# Patient Record
Sex: Female | Born: 1935 | Race: Black or African American | Hispanic: No | Smoking: Former smoker
Health system: Southern US, Community
[De-identification: ages and names within clinical notes are randomized; demographics above are authoritative.]

## PROBLEM LIST (undated history)

## (undated) DIAGNOSIS — Z972 Presence of dental prosthetic device (complete) (partial): Secondary | ICD-10-CM

## (undated) DIAGNOSIS — I10 Essential (primary) hypertension: Secondary | ICD-10-CM

## (undated) DIAGNOSIS — F419 Anxiety disorder, unspecified: Secondary | ICD-10-CM

## (undated) DIAGNOSIS — Z973 Presence of spectacles and contact lenses: Secondary | ICD-10-CM

## (undated) DIAGNOSIS — M069 Rheumatoid arthritis, unspecified: Secondary | ICD-10-CM

## (undated) DIAGNOSIS — E789 Disorder of lipoprotein metabolism, unspecified: Secondary | ICD-10-CM

## (undated) DIAGNOSIS — IMO0002 Reserved for concepts with insufficient information to code with codable children: Secondary | ICD-10-CM

## (undated) DIAGNOSIS — K759 Inflammatory liver disease, unspecified: Secondary | ICD-10-CM

## (undated) DIAGNOSIS — N289 Disorder of kidney and ureter, unspecified: Secondary | ICD-10-CM

## (undated) DIAGNOSIS — R109 Unspecified abdominal pain: Secondary | ICD-10-CM

## (undated) DIAGNOSIS — C50919 Malignant neoplasm of unspecified site of unspecified female breast: Principal | ICD-10-CM

## (undated) DIAGNOSIS — IMO0001 Reserved for inherently not codable concepts without codable children: Secondary | ICD-10-CM

## (undated) HISTORY — PX: OTHER SURGICAL HISTORY: SHX169

## (undated) HISTORY — DX: Reserved for concepts with insufficient information to code with codable children: IMO0002

## (undated) HISTORY — DX: Inflammatory liver disease, unspecified: K75.9

## (undated) HISTORY — DX: Disorder of lipoprotein metabolism, unspecified: E78.9

## (undated) HISTORY — PX: COLONOSCOPY: SHX174

## (undated) HISTORY — DX: Anxiety disorder, unspecified: F41.9

## (undated) HISTORY — PX: ABDOMINAL HYSTERECTOMY: SHX81

## (undated) HISTORY — DX: Reserved for inherently not codable concepts without codable children: IMO0001

## (undated) HISTORY — DX: Essential (primary) hypertension: I10

## (undated) HISTORY — PX: APPENDECTOMY: SHX54

## (undated) HISTORY — DX: Malignant neoplasm of unspecified site of unspecified female breast: C50.919

---

## 1990-10-21 HISTORY — PX: FOOT SURGERY: SHX648

## 2000-08-01 ENCOUNTER — Emergency Department (HOSPITAL_COMMUNITY): Admission: EM | Admit: 2000-08-01 | Discharge: 2000-08-01 | Payer: Self-pay | Admitting: Emergency Medicine

## 2000-08-13 ENCOUNTER — Other Ambulatory Visit: Admission: RE | Admit: 2000-08-13 | Discharge: 2000-08-13 | Payer: Self-pay | Admitting: *Deleted

## 2001-06-19 ENCOUNTER — Ambulatory Visit (HOSPITAL_COMMUNITY): Admission: RE | Admit: 2001-06-19 | Discharge: 2001-06-19 | Payer: Self-pay | Admitting: Family Medicine

## 2001-08-12 ENCOUNTER — Other Ambulatory Visit: Admission: RE | Admit: 2001-08-12 | Discharge: 2001-08-12 | Payer: Self-pay | Admitting: Family Medicine

## 2002-01-27 ENCOUNTER — Emergency Department (HOSPITAL_COMMUNITY): Admission: EM | Admit: 2002-01-27 | Discharge: 2002-01-27 | Payer: Self-pay

## 2002-01-27 ENCOUNTER — Encounter: Payer: Self-pay | Admitting: *Deleted

## 2002-02-17 ENCOUNTER — Ambulatory Visit (HOSPITAL_COMMUNITY): Admission: RE | Admit: 2002-02-17 | Discharge: 2002-02-17 | Payer: Self-pay | Admitting: Family Medicine

## 2003-01-11 ENCOUNTER — Encounter: Admission: RE | Admit: 2003-01-11 | Discharge: 2003-01-11 | Payer: Self-pay | Admitting: Family Medicine

## 2003-01-11 ENCOUNTER — Encounter: Payer: Self-pay | Admitting: Family Medicine

## 2003-01-17 ENCOUNTER — Encounter: Admission: RE | Admit: 2003-01-17 | Discharge: 2003-01-17 | Payer: Self-pay | Admitting: Family Medicine

## 2003-01-17 ENCOUNTER — Encounter: Payer: Self-pay | Admitting: Family Medicine

## 2003-09-05 ENCOUNTER — Ambulatory Visit (HOSPITAL_COMMUNITY): Admission: RE | Admit: 2003-09-05 | Discharge: 2003-09-05 | Payer: Self-pay | Admitting: Gastroenterology

## 2003-09-19 ENCOUNTER — Encounter: Admission: RE | Admit: 2003-09-19 | Discharge: 2003-09-19 | Payer: Self-pay | Admitting: Gastroenterology

## 2004-03-23 ENCOUNTER — Encounter (INDEPENDENT_AMBULATORY_CARE_PROVIDER_SITE_OTHER): Payer: Self-pay | Admitting: Specialist

## 2004-03-23 ENCOUNTER — Other Ambulatory Visit: Admission: RE | Admit: 2004-03-23 | Discharge: 2004-03-23 | Payer: Self-pay | Admitting: Oncology

## 2004-06-18 ENCOUNTER — Emergency Department (HOSPITAL_COMMUNITY): Admission: EM | Admit: 2004-06-18 | Discharge: 2004-06-18 | Payer: Self-pay | Admitting: Emergency Medicine

## 2004-07-09 ENCOUNTER — Emergency Department (HOSPITAL_COMMUNITY): Admission: EM | Admit: 2004-07-09 | Discharge: 2004-07-09 | Payer: Self-pay | Admitting: Emergency Medicine

## 2004-11-02 ENCOUNTER — Ambulatory Visit: Payer: Self-pay | Admitting: Oncology

## 2004-12-24 ENCOUNTER — Ambulatory Visit: Payer: Self-pay | Admitting: Oncology

## 2005-02-18 ENCOUNTER — Ambulatory Visit: Payer: Self-pay | Admitting: Oncology

## 2005-04-18 ENCOUNTER — Ambulatory Visit: Payer: Self-pay | Admitting: Oncology

## 2005-06-19 ENCOUNTER — Ambulatory Visit: Payer: Self-pay | Admitting: Oncology

## 2005-08-30 ENCOUNTER — Ambulatory Visit: Payer: Self-pay | Admitting: Oncology

## 2005-10-25 ENCOUNTER — Ambulatory Visit: Payer: Self-pay | Admitting: Oncology

## 2005-11-29 ENCOUNTER — Encounter: Admission: RE | Admit: 2005-11-29 | Discharge: 2005-11-29 | Payer: Self-pay | Admitting: Family Medicine

## 2005-12-20 ENCOUNTER — Ambulatory Visit: Payer: Self-pay | Admitting: Oncology

## 2006-02-14 ENCOUNTER — Ambulatory Visit: Payer: Self-pay | Admitting: Oncology

## 2006-03-19 LAB — CBC WITH DIFFERENTIAL (CANCER CENTER ONLY)
BASO#: 0 10*3/uL (ref 0.0–0.2)
EOS%: 2.3 % (ref 0.0–7.0)
LYMPH#: 1.1 10*3/uL (ref 0.9–3.3)
MCHC: 33.1 g/dL (ref 32.0–36.0)
MONO#: 0.5 10*3/uL (ref 0.1–0.9)
NEUT%: 57.2 % (ref 39.6–80.0)
RBC: 3.79 10*6/uL (ref 3.70–5.32)
WBC: 4 10*3/uL (ref 3.9–10.0)

## 2006-03-21 ENCOUNTER — Ambulatory Visit: Payer: Self-pay | Admitting: Gastroenterology

## 2006-04-15 ENCOUNTER — Ambulatory Visit: Payer: Self-pay | Admitting: Oncology

## 2006-04-16 LAB — CBC WITH DIFFERENTIAL (CANCER CENTER ONLY)
BASO#: 0 10*3/uL (ref 0.0–0.2)
EOS%: 3 % (ref 0.0–7.0)
HCT: 31.8 % — ABNORMAL LOW (ref 34.8–46.6)
HGB: 10.9 g/dL — ABNORMAL LOW (ref 11.6–15.9)
LYMPH%: 33.2 % (ref 14.0–48.0)
MCH: 35.9 pg — ABNORMAL HIGH (ref 26.0–34.0)
MCHC: 34.2 g/dL (ref 32.0–36.0)
MCV: 105 fL — ABNORMAL HIGH (ref 81–101)
MONO%: 5.7 % (ref 0.0–13.0)
NEUT%: 57.3 % (ref 39.6–80.0)

## 2006-05-12 LAB — CBC WITH DIFFERENTIAL (CANCER CENTER ONLY)
BASO#: 0 10*3/uL (ref 0.0–0.2)
EOS%: 2.2 % (ref 0.0–7.0)
HCT: 39.6 % (ref 34.8–46.6)
HGB: 13 g/dL (ref 11.6–15.9)
MCH: 35.7 pg — ABNORMAL HIGH (ref 26.0–34.0)
MCHC: 32.9 g/dL (ref 32.0–36.0)
MONO%: 12.6 % (ref 0.0–13.0)
NEUT%: 51.6 % (ref 39.6–80.0)

## 2006-06-06 ENCOUNTER — Ambulatory Visit: Payer: Self-pay | Admitting: Oncology

## 2006-06-09 LAB — CBC WITH DIFFERENTIAL (CANCER CENTER ONLY)
BASO%: 1.3 % (ref 0.0–2.0)
Eosinophils Absolute: 0.2 10*3/uL (ref 0.0–0.5)
HCT: 31.6 % — ABNORMAL LOW (ref 34.8–46.6)
LYMPH#: 1.4 10*3/uL (ref 0.9–3.3)
MONO#: 0.8 10*3/uL (ref 0.1–0.9)
NEUT%: 44.7 % (ref 39.6–80.0)
RBC: 2.96 10*6/uL — ABNORMAL LOW (ref 3.70–5.32)
RDW: 11.2 % (ref 10.5–14.6)
WBC: 4.4 10*3/uL (ref 3.9–10.0)

## 2006-06-26 ENCOUNTER — Ambulatory Visit: Payer: Self-pay | Admitting: Gastroenterology

## 2006-07-07 LAB — CBC WITH DIFFERENTIAL (CANCER CENTER ONLY)
BASO#: 0 10*3/uL (ref 0.0–0.2)
HCT: 37 % (ref 34.8–46.6)
HGB: 12.1 g/dL (ref 11.6–15.9)
LYMPH#: 1.1 10*3/uL (ref 0.9–3.3)
MCHC: 32.8 g/dL (ref 32.0–36.0)
MONO#: 0.3 10*3/uL (ref 0.1–0.9)
NEUT%: 49.3 % (ref 39.6–80.0)
WBC: 3 10*3/uL — ABNORMAL LOW (ref 3.9–10.0)

## 2006-07-25 ENCOUNTER — Ambulatory Visit: Payer: Self-pay | Admitting: Gastroenterology

## 2006-08-07 ENCOUNTER — Ambulatory Visit: Payer: Self-pay | Admitting: Oncology

## 2006-09-01 LAB — CBC WITH DIFFERENTIAL (CANCER CENTER ONLY)
BASO%: 0.5 % (ref 0.0–2.0)
LYMPH#: 0.9 10*3/uL (ref 0.9–3.3)
LYMPH%: 37.8 % (ref 14.0–48.0)
MONO#: 0.4 10*3/uL (ref 0.1–0.9)
NEUT#: 1.1 10*3/uL — ABNORMAL LOW (ref 1.5–6.5)
Platelets: 191 10*3/uL (ref 145–400)
RBC: 3.89 10*6/uL (ref 3.70–5.32)
RDW: 10.7 % (ref 10.5–14.6)
WBC: 2.5 10*3/uL — ABNORMAL LOW (ref 3.9–10.0)

## 2006-09-05 ENCOUNTER — Ambulatory Visit (HOSPITAL_COMMUNITY): Admission: RE | Admit: 2006-09-05 | Discharge: 2006-09-05 | Payer: Self-pay | Admitting: Gastroenterology

## 2006-09-05 ENCOUNTER — Encounter (INDEPENDENT_AMBULATORY_CARE_PROVIDER_SITE_OTHER): Payer: Self-pay | Admitting: Specialist

## 2006-09-26 ENCOUNTER — Ambulatory Visit: Payer: Self-pay | Admitting: Oncology

## 2006-09-29 LAB — CBC WITH DIFFERENTIAL (CANCER CENTER ONLY)
BASO%: 0.3 % (ref 0.0–2.0)
EOS%: 4.6 % (ref 0.0–7.0)
LYMPH%: 30.4 % (ref 14.0–48.0)
MCH: 34.6 pg — ABNORMAL HIGH (ref 26.0–34.0)
MCV: 103 fL — ABNORMAL HIGH (ref 81–101)
MONO%: 9.7 % (ref 0.0–13.0)
NEUT#: 1.8 10*3/uL (ref 1.5–6.5)
Platelets: 145 10*3/uL (ref 145–400)
RDW: 11.2 % (ref 10.5–14.6)

## 2006-10-27 LAB — CBC WITH DIFFERENTIAL (CANCER CENTER ONLY)
BASO%: 0.4 % (ref 0.0–2.0)
EOS%: 2.9 % (ref 0.0–7.0)
Eosinophils Absolute: 0.1 10*3/uL (ref 0.0–0.5)
MCH: 35.7 pg — ABNORMAL HIGH (ref 26.0–34.0)
MONO%: 13.6 % — ABNORMAL HIGH (ref 0.0–13.0)
NEUT#: 1.4 10*3/uL — ABNORMAL LOW (ref 1.5–6.5)
Platelets: 162 10*3/uL (ref 145–400)
RBC: 3.89 10*6/uL (ref 3.70–5.32)
RDW: 11.6 % (ref 10.5–14.6)
WBC: 3 10*3/uL — ABNORMAL LOW (ref 3.9–10.0)

## 2006-11-18 ENCOUNTER — Ambulatory Visit: Payer: Self-pay | Admitting: Gastroenterology

## 2006-11-21 ENCOUNTER — Ambulatory Visit: Payer: Self-pay | Admitting: Oncology

## 2006-11-24 LAB — CBC WITH DIFFERENTIAL (CANCER CENTER ONLY)
BASO#: 0 10*3/uL (ref 0.0–0.2)
EOS%: 3.5 % (ref 0.0–7.0)
MCH: 35.2 pg — ABNORMAL HIGH (ref 26.0–34.0)
MCHC: 33.8 g/dL (ref 32.0–36.0)
MONO%: 13.8 % — ABNORMAL HIGH (ref 0.0–13.0)
NEUT#: 2.2 10*3/uL (ref 1.5–6.5)
Platelets: 184 10*3/uL (ref 145–400)

## 2006-12-19 ENCOUNTER — Ambulatory Visit: Payer: Self-pay | Admitting: Gastroenterology

## 2006-12-22 LAB — CBC WITH DIFFERENTIAL (CANCER CENTER ONLY)
EOS%: 2.4 % (ref 0.0–7.0)
MCH: 36.1 pg — ABNORMAL HIGH (ref 26.0–34.0)
MCHC: 34.2 g/dL (ref 32.0–36.0)
MONO%: 9.8 % (ref 0.0–13.0)
NEUT#: 1.9 10*3/uL (ref 1.5–6.5)
Platelets: 202 10*3/uL (ref 145–400)

## 2007-01-15 ENCOUNTER — Ambulatory Visit: Payer: Self-pay | Admitting: Oncology

## 2007-01-19 LAB — BASIC METABOLIC PANEL
CO2: 25 mEq/L (ref 19–32)
Calcium: 9.7 mg/dL (ref 8.4–10.5)
Chloride: 104 mEq/L (ref 96–112)
Glucose, Bld: 108 mg/dL — ABNORMAL HIGH (ref 70–99)
Sodium: 136 mEq/L (ref 135–145)

## 2007-01-19 LAB — CBC WITH DIFFERENTIAL (CANCER CENTER ONLY)
BASO%: 0.3 % (ref 0.0–2.0)
Eosinophils Absolute: 0.1 10*3/uL (ref 0.0–0.5)
MCH: 35.4 pg — ABNORMAL HIGH (ref 26.0–34.0)
MONO%: 8.9 % (ref 0.0–13.0)
NEUT#: 2.5 10*3/uL (ref 1.5–6.5)
Platelets: 219 10*3/uL (ref 145–400)
RBC: 3.31 10*6/uL — ABNORMAL LOW (ref 3.70–5.32)
RDW: 11.3 % (ref 10.5–14.6)
WBC: 4.1 10*3/uL (ref 3.9–10.0)

## 2007-01-19 LAB — IRON AND TIBC: %SAT: 33 % (ref 20–55)

## 2007-01-19 LAB — FERRITIN: Ferritin: 502 ng/mL — ABNORMAL HIGH (ref 10–291)

## 2007-02-17 LAB — CBC WITH DIFFERENTIAL (CANCER CENTER ONLY)
BASO%: 0.5 % (ref 0.0–2.0)
Eosinophils Absolute: 0.1 10*3/uL (ref 0.0–0.5)
LYMPH#: 1.6 10*3/uL (ref 0.9–3.3)
LYMPH%: 39.5 % (ref 14.0–48.0)
MCV: 104 fL — ABNORMAL HIGH (ref 81–101)
MONO#: 0.3 10*3/uL (ref 0.1–0.9)
NEUT#: 1.9 10*3/uL (ref 1.5–6.5)
Platelets: 234 10*3/uL (ref 145–400)
RBC: 3.34 10*6/uL — ABNORMAL LOW (ref 3.70–5.32)
WBC: 3.9 10*3/uL (ref 3.9–10.0)

## 2007-03-13 ENCOUNTER — Ambulatory Visit: Payer: Self-pay | Admitting: Oncology

## 2007-03-17 LAB — CBC WITH DIFFERENTIAL (CANCER CENTER ONLY)
BASO#: 0 10*3/uL (ref 0.0–0.2)
BASO%: 0.5 % (ref 0.0–2.0)
EOS%: 2 % (ref 0.0–7.0)
HCT: 33.9 % — ABNORMAL LOW (ref 34.8–46.6)
HGB: 11.4 g/dL — ABNORMAL LOW (ref 11.6–15.9)
LYMPH#: 1.2 10*3/uL (ref 0.9–3.3)
MCH: 33.1 pg (ref 26.0–34.0)
MCHC: 33.6 g/dL (ref 32.0–36.0)
MONO%: 10.1 % (ref 0.0–13.0)
NEUT#: 2.5 10*3/uL (ref 1.5–6.5)
NEUT%: 58.7 % (ref 39.6–80.0)
RDW: 11.5 % (ref 10.5–14.6)

## 2007-04-06 ENCOUNTER — Encounter: Admission: RE | Admit: 2007-04-06 | Discharge: 2007-04-06 | Payer: Self-pay | Admitting: Family Medicine

## 2007-04-13 ENCOUNTER — Ambulatory Visit: Payer: Self-pay | Admitting: Oncology

## 2007-04-14 LAB — CBC WITH DIFFERENTIAL (CANCER CENTER ONLY)
BASO%: 0.3 % (ref 0.0–2.0)
EOS%: 3.1 % (ref 0.0–7.0)
LYMPH#: 1.3 10*3/uL (ref 0.9–3.3)
MCH: 31.5 pg (ref 26.0–34.0)
MCHC: 33 g/dL (ref 32.0–36.0)
MONO%: 11 % (ref 0.0–13.0)
NEUT#: 2.6 10*3/uL (ref 1.5–6.5)
Platelets: 284 10*3/uL (ref 145–400)
RDW: 12.5 % (ref 10.5–14.6)

## 2007-04-15 LAB — COMPREHENSIVE METABOLIC PANEL
ALT: 41 U/L — ABNORMAL HIGH (ref 0–35)
AST: 60 U/L — ABNORMAL HIGH (ref 0–37)
Albumin: 3.6 g/dL (ref 3.5–5.2)
Alkaline Phosphatase: 191 U/L — ABNORMAL HIGH (ref 39–117)
BUN: 18 mg/dL (ref 6–23)
Calcium: 9.6 mg/dL (ref 8.4–10.5)
Chloride: 101 mEq/L (ref 96–112)
Potassium: 4 mEq/L (ref 3.5–5.3)
Sodium: 133 mEq/L — ABNORMAL LOW (ref 135–145)
Total Protein: 7.9 g/dL (ref 6.0–8.3)

## 2007-05-12 LAB — CBC WITH DIFFERENTIAL (CANCER CENTER ONLY)
Eosinophils Absolute: 0.2 10*3/uL (ref 0.0–0.5)
HGB: 11.5 g/dL — ABNORMAL LOW (ref 11.6–15.9)
LYMPH#: 0.6 10*3/uL — ABNORMAL LOW (ref 0.9–3.3)
MCH: 33.3 pg (ref 26.0–34.0)
MONO#: 0.4 10*3/uL (ref 0.1–0.9)
MONO%: 2.9 % (ref 0.0–13.0)
NEUT#: 12.5 10*3/uL — ABNORMAL HIGH (ref 1.5–6.5)
Platelets: 222 10*3/uL (ref 145–400)
RBC: 3.45 10*6/uL — ABNORMAL LOW (ref 3.70–5.32)
WBC: 13.7 10*3/uL — ABNORMAL HIGH (ref 3.9–10.0)

## 2007-05-13 ENCOUNTER — Encounter: Admission: RE | Admit: 2007-05-13 | Discharge: 2007-05-13 | Payer: Self-pay | Admitting: Orthopedic Surgery

## 2007-06-08 ENCOUNTER — Ambulatory Visit: Payer: Self-pay | Admitting: Oncology

## 2007-06-10 LAB — CBC WITH DIFFERENTIAL (CANCER CENTER ONLY)
BASO#: 0 10*3/uL (ref 0.0–0.2)
Eosinophils Absolute: 0.1 10*3/uL (ref 0.0–0.5)
HCT: 36.5 % (ref 34.8–46.6)
HGB: 12.4 g/dL (ref 11.6–15.9)
LYMPH#: 1 10*3/uL (ref 0.9–3.3)
LYMPH%: 26.6 % (ref 14.0–48.0)
MCV: 100 fL (ref 81–101)
MONO#: 0.6 10*3/uL (ref 0.1–0.9)
NEUT%: 56.7 % (ref 39.6–80.0)
RBC: 3.64 10*6/uL — ABNORMAL LOW (ref 3.70–5.32)
WBC: 3.8 10*3/uL — ABNORMAL LOW (ref 3.9–10.0)

## 2007-07-07 LAB — CBC WITH DIFFERENTIAL (CANCER CENTER ONLY)
BASO#: 0 10*3/uL (ref 0.0–0.2)
BASO%: 0.3 % (ref 0.0–2.0)
EOS%: 4.3 % (ref 0.0–7.0)
HCT: 31.5 % — ABNORMAL LOW (ref 34.8–46.6)
HGB: 10.6 g/dL — ABNORMAL LOW (ref 11.6–15.9)
LYMPH#: 1 10*3/uL (ref 0.9–3.3)
LYMPH%: 28.5 % (ref 14.0–48.0)
MCHC: 33.7 g/dL (ref 32.0–36.0)
MCV: 102 fL — ABNORMAL HIGH (ref 81–101)
NEUT%: 54.4 % (ref 39.6–80.0)
RDW: 12.6 % (ref 10.5–14.6)

## 2007-07-31 ENCOUNTER — Ambulatory Visit: Payer: Self-pay | Admitting: Oncology

## 2007-08-20 LAB — CBC WITH DIFFERENTIAL (CANCER CENTER ONLY)
BASO#: 0 10*3/uL (ref 0.0–0.2)
Eosinophils Absolute: 0.2 10*3/uL (ref 0.0–0.5)
HCT: 33.4 % — ABNORMAL LOW (ref 34.8–46.6)
HGB: 11.3 g/dL — ABNORMAL LOW (ref 11.6–15.9)
LYMPH%: 39.1 % (ref 14.0–48.0)
MCH: 35.1 pg — ABNORMAL HIGH (ref 26.0–34.0)
MCV: 103 fL — ABNORMAL HIGH (ref 81–101)
MONO#: 0.2 10*3/uL (ref 0.1–0.9)
MONO%: 5.4 % (ref 0.0–13.0)
RBC: 3.23 10*6/uL — ABNORMAL LOW (ref 3.70–5.32)
WBC: 4.2 10*3/uL (ref 3.9–10.0)

## 2007-08-20 LAB — IRON AND TIBC
%SAT: 45 % (ref 20–55)
Iron: 187 ug/dL — ABNORMAL HIGH (ref 42–145)
TIBC: 411 ug/dL (ref 250–470)
UIBC: 224 ug/dL

## 2007-08-20 LAB — FERRITIN: Ferritin: 300 ng/mL — ABNORMAL HIGH (ref 10–291)

## 2007-09-14 ENCOUNTER — Ambulatory Visit: Payer: Self-pay | Admitting: Oncology

## 2007-09-15 LAB — CBC WITH DIFFERENTIAL (CANCER CENTER ONLY)
BASO%: 0.5 % (ref 0.0–2.0)
Eosinophils Absolute: 0.1 10*3/uL (ref 0.0–0.5)
HCT: 36.7 % (ref 34.8–46.6)
LYMPH%: 41.5 % (ref 14.0–48.0)
MCH: 34.6 pg — ABNORMAL HIGH (ref 26.0–34.0)
MCV: 103 fL — ABNORMAL HIGH (ref 81–101)
MONO#: 0.5 10*3/uL (ref 0.1–0.9)
MONO%: 12.3 % (ref 0.0–13.0)
NEUT%: 42.4 % (ref 39.6–80.0)
Platelets: 183 10*3/uL (ref 145–400)
RDW: 10.8 % (ref 10.5–14.6)
WBC: 4 10*3/uL (ref 3.9–10.0)

## 2007-10-13 LAB — CBC WITH DIFFERENTIAL (CANCER CENTER ONLY)
BASO%: 0.4 % (ref 0.0–2.0)
EOS%: 3.2 % (ref 0.0–7.0)
HCT: 35.9 % (ref 34.8–46.6)
LYMPH%: 36.5 % (ref 14.0–48.0)
MCHC: 33.8 g/dL (ref 32.0–36.0)
MCV: 100 fL (ref 81–101)
NEUT%: 50.4 % (ref 39.6–80.0)
Platelets: 193 10*3/uL (ref 145–400)
RDW: 11.5 % (ref 10.5–14.6)
WBC: 5.2 10*3/uL (ref 3.9–10.0)

## 2007-11-09 ENCOUNTER — Ambulatory Visit: Payer: Self-pay | Admitting: Oncology

## 2007-11-10 LAB — CBC WITH DIFFERENTIAL (CANCER CENTER ONLY)
BASO#: 0 10*3/uL (ref 0.0–0.2)
Eosinophils Absolute: 0.2 10*3/uL (ref 0.0–0.5)
HGB: 11.6 g/dL (ref 11.6–15.9)
LYMPH%: 41.6 % (ref 14.0–48.0)
MCV: 101 fL (ref 81–101)
MONO#: 0.6 10*3/uL (ref 0.1–0.9)
Platelets: 211 10*3/uL (ref 145–400)
RBC: 3.33 10*6/uL — ABNORMAL LOW (ref 3.70–5.32)
WBC: 5.1 10*3/uL (ref 3.9–10.0)

## 2007-12-09 LAB — CBC WITH DIFFERENTIAL (CANCER CENTER ONLY)
BASO#: 0 10*3/uL (ref 0.0–0.2)
EOS%: 4.9 % (ref 0.0–7.0)
Eosinophils Absolute: 0.2 10*3/uL (ref 0.0–0.5)
HGB: 12.3 g/dL (ref 11.6–15.9)
LYMPH%: 38.9 % (ref 14.0–48.0)
MCH: 34.7 pg — ABNORMAL HIGH (ref 26.0–34.0)
MCHC: 34.1 g/dL (ref 32.0–36.0)
MCV: 102 fL — ABNORMAL HIGH (ref 81–101)
MONO%: 8.4 % (ref 0.0–13.0)
Platelets: 167 10*3/uL (ref 145–400)
RBC: 3.54 10*6/uL — ABNORMAL LOW (ref 3.70–5.32)

## 2007-12-28 ENCOUNTER — Ambulatory Visit: Payer: Self-pay | Admitting: Oncology

## 2007-12-28 LAB — CBC WITH DIFFERENTIAL (CANCER CENTER ONLY)
BASO#: 0 10*3/uL (ref 0.0–0.2)
EOS%: 3.5 % (ref 0.0–7.0)
Eosinophils Absolute: 0.2 10*3/uL (ref 0.0–0.5)
HCT: 34.4 % — ABNORMAL LOW (ref 34.8–46.6)
HGB: 11.4 g/dL — ABNORMAL LOW (ref 11.6–15.9)
LYMPH#: 2.1 10*3/uL (ref 0.9–3.3)
MCHC: 33.3 g/dL (ref 32.0–36.0)
MONO#: 0.4 10*3/uL (ref 0.1–0.9)
NEUT#: 2.4 10*3/uL (ref 1.5–6.5)
NEUT%: 46.8 % (ref 39.6–80.0)
RBC: 3.38 10*6/uL — ABNORMAL LOW (ref 3.70–5.32)
WBC: 5.1 10*3/uL (ref 3.9–10.0)

## 2008-02-09 LAB — CBC WITH DIFFERENTIAL (CANCER CENTER ONLY)
BASO#: 0 10*3/uL (ref 0.0–0.2)
EOS%: 3 % (ref 0.0–7.0)
Eosinophils Absolute: 0.1 10*3/uL (ref 0.0–0.5)
HCT: 39.4 % (ref 34.8–46.6)
HGB: 13.6 g/dL (ref 11.6–15.9)
MCH: 34.2 pg — ABNORMAL HIGH (ref 26.0–34.0)
MCHC: 34.5 g/dL (ref 32.0–36.0)
MONO%: 8.6 % (ref 0.0–13.0)
NEUT#: 2.1 10*3/uL (ref 1.5–6.5)
NEUT%: 56.2 % (ref 39.6–80.0)
RBC: 3.97 10*6/uL (ref 3.70–5.32)

## 2008-02-09 LAB — IRON AND TIBC: UIBC: 219 ug/dL

## 2008-02-29 ENCOUNTER — Ambulatory Visit: Payer: Self-pay | Admitting: Oncology

## 2008-03-01 LAB — CBC WITH DIFFERENTIAL (CANCER CENTER ONLY)
BASO%: 0.3 % (ref 0.0–2.0)
LYMPH%: 38.9 % (ref 14.0–48.0)
MCV: 97 fL (ref 81–101)
MONO#: 0.4 10*3/uL (ref 0.1–0.9)
MONO%: 9.8 % (ref 0.0–13.0)
Platelets: 193 10*3/uL (ref 145–400)
RDW: 10.6 % (ref 10.5–14.6)
WBC: 3.8 10*3/uL — ABNORMAL LOW (ref 3.9–10.0)

## 2008-03-31 LAB — CBC WITH DIFFERENTIAL (CANCER CENTER ONLY)
BASO%: 0.4 % (ref 0.0–2.0)
HCT: 33.9 % — ABNORMAL LOW (ref 34.8–46.6)
LYMPH#: 1.5 10*3/uL (ref 0.9–3.3)
MONO#: 0.4 10*3/uL (ref 0.1–0.9)
Platelets: 182 10*3/uL (ref 145–400)
RDW: 11.7 % (ref 10.5–14.6)
WBC: 4.5 10*3/uL (ref 3.9–10.0)

## 2008-04-25 ENCOUNTER — Ambulatory Visit: Payer: Self-pay | Admitting: Oncology

## 2008-04-28 LAB — CBC WITH DIFFERENTIAL (CANCER CENTER ONLY)
BASO%: 0.5 % (ref 0.0–2.0)
EOS%: 3.3 % (ref 0.0–7.0)
HCT: 39.9 % (ref 34.8–46.6)
LYMPH#: 2.2 10*3/uL (ref 0.9–3.3)
MCHC: 33.7 g/dL (ref 32.0–36.0)
MONO#: 0.5 10*3/uL (ref 0.1–0.9)
NEUT#: 2.2 10*3/uL (ref 1.5–6.5)
NEUT%: 43.4 % (ref 39.6–80.0)
RDW: 11.3 % (ref 10.5–14.6)
WBC: 4.9 10*3/uL (ref 3.9–10.0)

## 2008-05-05 ENCOUNTER — Encounter: Admission: RE | Admit: 2008-05-05 | Discharge: 2008-05-05 | Payer: Self-pay | Admitting: Family Medicine

## 2008-05-16 ENCOUNTER — Inpatient Hospital Stay (HOSPITAL_COMMUNITY): Admission: EM | Admit: 2008-05-16 | Discharge: 2008-05-18 | Payer: Self-pay | Admitting: Emergency Medicine

## 2008-05-17 ENCOUNTER — Encounter (INDEPENDENT_AMBULATORY_CARE_PROVIDER_SITE_OTHER): Payer: Self-pay | Admitting: Gastroenterology

## 2008-05-24 LAB — CBC WITH DIFFERENTIAL (CANCER CENTER ONLY)
BASO#: 0 10*3/uL (ref 0.0–0.2)
Eosinophils Absolute: 0.2 10*3/uL (ref 0.0–0.5)
HCT: 29.2 % — ABNORMAL LOW (ref 34.8–46.6)
HGB: 10.2 g/dL — ABNORMAL LOW (ref 11.6–15.9)
LYMPH#: 1.8 10*3/uL (ref 0.9–3.3)
MCH: 33.1 pg (ref 26.0–34.0)
NEUT#: 3.7 10*3/uL (ref 1.5–6.5)
NEUT%: 58.9 % (ref 39.6–80.0)
RBC: 3.07 10*6/uL — ABNORMAL LOW (ref 3.70–5.32)

## 2008-06-17 ENCOUNTER — Ambulatory Visit: Payer: Self-pay | Admitting: Oncology

## 2008-06-21 LAB — CBC WITH DIFFERENTIAL (CANCER CENTER ONLY)
EOS%: 2.4 % (ref 0.0–7.0)
Eosinophils Absolute: 0.2 10*3/uL (ref 0.0–0.5)
LYMPH%: 31.1 % (ref 14.0–48.0)
MCH: 32.7 pg (ref 26.0–34.0)
MCHC: 34.1 g/dL (ref 32.0–36.0)
MCV: 96 fL (ref 81–101)
MONO%: 9.9 % (ref 0.0–13.0)
Platelets: 240 10*3/uL (ref 145–400)
RBC: 3.42 10*6/uL — ABNORMAL LOW (ref 3.70–5.32)
RDW: 11.1 % (ref 10.5–14.6)

## 2008-06-25 ENCOUNTER — Ambulatory Visit: Payer: Self-pay | Admitting: Cardiology

## 2008-06-25 ENCOUNTER — Observation Stay (HOSPITAL_COMMUNITY): Admission: EM | Admit: 2008-06-25 | Discharge: 2008-06-28 | Payer: Self-pay | Admitting: Emergency Medicine

## 2008-06-28 ENCOUNTER — Encounter (INDEPENDENT_AMBULATORY_CARE_PROVIDER_SITE_OTHER): Payer: Self-pay | Admitting: Internal Medicine

## 2008-07-18 LAB — CBC WITH DIFFERENTIAL (CANCER CENTER ONLY)
BASO#: 0 10*3/uL (ref 0.0–0.2)
Eosinophils Absolute: 0.2 10*3/uL (ref 0.0–0.5)
HCT: 31.6 % — ABNORMAL LOW (ref 34.8–46.6)
HGB: 10.8 g/dL — ABNORMAL LOW (ref 11.6–15.9)
LYMPH%: 36 % (ref 14.0–48.0)
MCH: 32.4 pg (ref 26.0–34.0)
MCHC: 34.2 g/dL (ref 32.0–36.0)
MCV: 95 fL (ref 81–101)
MONO%: 10.8 % (ref 0.0–13.0)
NEUT%: 49.7 % (ref 39.6–80.0)
RBC: 3.33 10*6/uL — ABNORMAL LOW (ref 3.70–5.32)

## 2008-08-05 ENCOUNTER — Ambulatory Visit: Payer: Self-pay | Admitting: Oncology

## 2008-08-10 LAB — CBC WITH DIFFERENTIAL (CANCER CENTER ONLY)
BASO#: 0 10*3/uL (ref 0.0–0.2)
BASO%: 0.8 % (ref 0.0–2.0)
EOS%: 3.2 % (ref 0.0–7.0)
HCT: 36.6 % (ref 34.8–46.6)
HGB: 12.4 g/dL (ref 11.6–15.9)
LYMPH#: 2.1 10*3/uL (ref 0.9–3.3)
MCHC: 33.8 g/dL (ref 32.0–36.0)
MONO#: 0.7 10*3/uL (ref 0.1–0.9)
NEUT#: 2.6 10*3/uL (ref 1.5–6.5)
NEUT%: 47.1 % (ref 39.6–80.0)
WBC: 5.6 10*3/uL (ref 3.9–10.0)

## 2008-08-10 LAB — IRON AND TIBC
%SAT: 42 % (ref 20–55)
TIBC: 435 ug/dL (ref 250–470)
UIBC: 251 ug/dL

## 2008-09-07 LAB — CBC WITH DIFFERENTIAL (CANCER CENTER ONLY)
BASO%: 0.5 % (ref 0.0–2.0)
EOS%: 2.6 % (ref 0.0–7.0)
LYMPH#: 2.2 10*3/uL (ref 0.9–3.3)
MCHC: 34.7 g/dL (ref 32.0–36.0)
MONO#: 0.6 10*3/uL (ref 0.1–0.9)
NEUT#: 3 10*3/uL (ref 1.5–6.5)
NEUT%: 49.8 % (ref 39.6–80.0)
Platelets: 216 10*3/uL (ref 145–400)
RDW: 13 % (ref 10.5–14.6)
WBC: 6 10*3/uL (ref 3.9–10.0)

## 2008-10-03 ENCOUNTER — Ambulatory Visit: Payer: Self-pay | Admitting: Oncology

## 2008-10-05 LAB — CBC WITH DIFFERENTIAL (CANCER CENTER ONLY)
BASO%: 0.7 % (ref 0.0–2.0)
EOS%: 2.5 % (ref 0.0–7.0)
HCT: 35.6 % (ref 34.8–46.6)
LYMPH%: 29.8 % (ref 14.0–48.0)
MCH: 31.6 pg (ref 26.0–34.0)
MCHC: 33 g/dL (ref 32.0–36.0)
MCV: 96 fL (ref 81–101)
MONO#: 0.5 10*3/uL (ref 0.1–0.9)
MONO%: 7.4 % (ref 0.0–13.0)
NEUT%: 59.6 % (ref 39.6–80.0)
Platelets: 229 10*3/uL (ref 145–400)
RDW: 11.9 % (ref 10.5–14.6)
WBC: 7 10*3/uL (ref 3.9–10.0)

## 2008-11-02 LAB — CBC WITH DIFFERENTIAL (CANCER CENTER ONLY)
Eosinophils Absolute: 0.2 10*3/uL (ref 0.0–0.5)
HCT: 37.8 % (ref 34.8–46.6)
LYMPH%: 29.5 % (ref 14.0–48.0)
MCH: 31.1 pg (ref 26.0–34.0)
MCV: 95 fL (ref 81–101)
MONO%: 6.2 % (ref 0.0–13.0)
NEUT%: 61.1 % (ref 39.6–80.0)
Platelets: 232 10*3/uL (ref 145–400)
RBC: 3.99 10*6/uL (ref 3.70–5.32)
RDW: 11.8 % (ref 10.5–14.6)

## 2008-11-29 ENCOUNTER — Ambulatory Visit: Payer: Self-pay | Admitting: Oncology

## 2008-12-28 LAB — CBC WITH DIFFERENTIAL (CANCER CENTER ONLY)
BASO#: 0 10*3/uL (ref 0.0–0.2)
Eosinophils Absolute: 0.1 10*3/uL (ref 0.0–0.5)
HGB: 11.2 g/dL — ABNORMAL LOW (ref 11.6–15.9)
LYMPH%: 28.5 % (ref 14.0–48.0)
MCH: 30.5 pg (ref 26.0–34.0)
MCV: 93 fL (ref 81–101)
MONO#: 0.5 10*3/uL (ref 0.1–0.9)
MONO%: 9.2 % (ref 0.0–13.0)
RBC: 3.66 10*6/uL — ABNORMAL LOW (ref 3.70–5.32)

## 2009-01-23 ENCOUNTER — Ambulatory Visit: Payer: Self-pay | Admitting: Oncology

## 2009-01-25 LAB — CBC WITH DIFFERENTIAL (CANCER CENTER ONLY)
BASO#: 0 10*3/uL (ref 0.0–0.2)
Eosinophils Absolute: 0.1 10*3/uL (ref 0.0–0.5)
HGB: 11.8 g/dL (ref 11.6–15.9)
LYMPH#: 1.5 10*3/uL (ref 0.9–3.3)
MONO#: 0.5 10*3/uL (ref 0.1–0.9)
NEUT#: 3.3 10*3/uL (ref 1.5–6.5)
RBC: 3.75 10*6/uL (ref 3.70–5.32)
WBC: 5.4 10*3/uL (ref 3.9–10.0)

## 2009-01-25 LAB — FERRITIN: Ferritin: 64 ng/mL (ref 10–291)

## 2009-01-25 LAB — BASIC METABOLIC PANEL - CANCER CENTER ONLY
BUN, Bld: 19 mg/dL (ref 7–22)
Calcium: 9.4 mg/dL (ref 8.0–10.3)
Glucose, Bld: 106 mg/dL (ref 73–118)
Potassium: 4.2 mEq/L (ref 3.3–4.7)

## 2009-01-25 LAB — IRON AND TIBC: Iron: 147 ug/dL — ABNORMAL HIGH (ref 42–145)

## 2009-02-22 LAB — CBC WITH DIFFERENTIAL (CANCER CENTER ONLY)
BASO#: 0 10*3/uL (ref 0.0–0.2)
BASO%: 0.9 % (ref 0.0–2.0)
HCT: 38.6 % (ref 34.8–46.6)
HGB: 13.2 g/dL (ref 11.6–15.9)
LYMPH#: 1.5 10*3/uL (ref 0.9–3.3)
MONO#: 0.5 10*3/uL (ref 0.1–0.9)
NEUT%: 56.3 % (ref 39.6–80.0)
RBC: 4.07 10*6/uL (ref 3.70–5.32)
RDW: 12.7 % (ref 10.5–14.6)
WBC: 5 10*3/uL (ref 3.9–10.0)

## 2009-03-17 ENCOUNTER — Ambulatory Visit: Payer: Self-pay | Admitting: Oncology

## 2009-03-22 LAB — CBC WITH DIFFERENTIAL (CANCER CENTER ONLY)
BASO%: 0.5 % (ref 0.0–2.0)
LYMPH#: 1.6 10*3/uL (ref 0.9–3.3)
MONO#: 0.6 10*3/uL (ref 0.1–0.9)
NEUT#: 2.7 10*3/uL (ref 1.5–6.5)
Platelets: 231 10*3/uL (ref 145–400)
RDW: 13.7 % (ref 10.5–14.6)
WBC: 5 10*3/uL (ref 3.9–10.0)

## 2009-04-17 ENCOUNTER — Ambulatory Visit: Payer: Self-pay | Admitting: Oncology

## 2009-04-19 LAB — CBC WITH DIFFERENTIAL (CANCER CENTER ONLY)
BASO#: 0 10*3/uL (ref 0.0–0.2)
EOS%: 3.1 % (ref 0.0–7.0)
HCT: 37.3 % (ref 34.8–46.6)
HGB: 12.9 g/dL (ref 11.6–15.9)
LYMPH#: 1.5 10*3/uL (ref 0.9–3.3)
MCHC: 34.5 g/dL (ref 32.0–36.0)
NEUT%: 42.6 % (ref 39.6–80.0)

## 2009-05-15 ENCOUNTER — Ambulatory Visit: Payer: Self-pay | Admitting: Oncology

## 2009-05-17 LAB — CBC WITH DIFFERENTIAL (CANCER CENTER ONLY)
BASO%: 0.5 % (ref 0.0–2.0)
LYMPH%: 27.3 % (ref 14.0–48.0)
MCH: 33 pg (ref 26.0–34.0)
MCV: 94 fL (ref 81–101)
MONO%: 9.6 % (ref 0.0–13.0)
NEUT#: 3.7 10*3/uL (ref 1.5–6.5)
Platelets: 233 10*3/uL (ref 145–400)
RDW: 13.4 % (ref 10.5–14.6)
WBC: 6.3 10*3/uL (ref 3.9–10.0)

## 2009-06-09 ENCOUNTER — Ambulatory Visit: Payer: Self-pay | Admitting: Oncology

## 2009-06-14 LAB — CBC WITH DIFFERENTIAL (CANCER CENTER ONLY)
EOS%: 3.3 % (ref 0.0–7.0)
Eosinophils Absolute: 0.1 10*3/uL (ref 0.0–0.5)
LYMPH%: 31.8 % (ref 14.0–48.0)
MCH: 33.8 pg (ref 26.0–34.0)
MCHC: 34.7 g/dL (ref 32.0–36.0)
MCV: 97 fL (ref 81–101)
MONO%: 9.2 % (ref 0.0–13.0)
Platelets: 251 10*3/uL (ref 145–400)
RBC: 3.71 10*6/uL (ref 3.70–5.32)

## 2009-07-05 ENCOUNTER — Ambulatory Visit: Payer: Self-pay | Admitting: Oncology

## 2009-07-12 LAB — CBC WITH DIFFERENTIAL (CANCER CENTER ONLY)
BASO#: 0 10*3/uL (ref 0.0–0.2)
Eosinophils Absolute: 0.2 10*3/uL (ref 0.0–0.5)
HCT: 34.6 % — ABNORMAL LOW (ref 34.8–46.6)
HGB: 11.7 g/dL (ref 11.6–15.9)
LYMPH%: 30.9 % (ref 14.0–48.0)
MCH: 33.7 pg (ref 26.0–34.0)
MCHC: 33.9 g/dL (ref 32.0–36.0)
MCV: 99 fL (ref 81–101)
MONO%: 8.6 % (ref 0.0–13.0)
NEUT#: 2.8 10*3/uL (ref 1.5–6.5)
NEUT%: 56.4 % (ref 39.6–80.0)
RBC: 3.48 10*6/uL — ABNORMAL LOW (ref 3.70–5.32)

## 2009-08-08 ENCOUNTER — Ambulatory Visit: Payer: Self-pay | Admitting: Oncology

## 2009-08-09 LAB — CBC WITH DIFFERENTIAL (CANCER CENTER ONLY)
BASO#: 0 10*3/uL (ref 0.0–0.2)
Eosinophils Absolute: 0.1 10*3/uL (ref 0.0–0.5)
HCT: 37.3 % (ref 34.8–46.6)
HGB: 12.5 g/dL (ref 11.6–15.9)
LYMPH#: 1.5 10*3/uL (ref 0.9–3.3)
MONO#: 0.5 10*3/uL (ref 0.1–0.9)
NEUT#: 2.7 10*3/uL (ref 1.5–6.5)
NEUT%: 55.9 % (ref 39.6–80.0)
RBC: 3.69 10*6/uL — ABNORMAL LOW (ref 3.70–5.32)

## 2009-08-09 LAB — BASIC METABOLIC PANEL - CANCER CENTER ONLY
Calcium: 9.8 mg/dL (ref 8.0–10.3)
Creat: 1.1 mg/dl (ref 0.6–1.2)
Sodium: 139 mEq/L (ref 128–145)

## 2009-08-09 LAB — IRON AND TIBC
TIBC: 400 ug/dL (ref 250–470)
UIBC: 268 ug/dL

## 2009-09-05 ENCOUNTER — Ambulatory Visit: Payer: Self-pay | Admitting: Oncology

## 2009-09-06 LAB — CBC WITH DIFFERENTIAL (CANCER CENTER ONLY)
BASO%: 0.4 % (ref 0.0–2.0)
EOS%: 2.9 % (ref 0.0–7.0)
HCT: 35.3 % (ref 34.8–46.6)
HGB: 11.8 g/dL (ref 11.6–15.9)
LYMPH#: 1.5 10*3/uL (ref 0.9–3.3)
MCHC: 33.5 g/dL (ref 32.0–36.0)
MONO#: 0.4 10*3/uL (ref 0.1–0.9)
NEUT#: 3.7 10*3/uL (ref 1.5–6.5)
NEUT%: 63.4 % (ref 39.6–80.0)
RDW: 11.7 % (ref 10.5–14.6)
WBC: 5.8 10*3/uL (ref 3.9–10.0)

## 2009-09-29 ENCOUNTER — Ambulatory Visit: Payer: Self-pay | Admitting: Oncology

## 2009-10-04 LAB — CBC WITH DIFFERENTIAL (CANCER CENTER ONLY)
BASO%: 0.6 % (ref 0.0–2.0)
EOS%: 3.3 % (ref 0.0–7.0)
LYMPH#: 1.7 10*3/uL (ref 0.9–3.3)
LYMPH%: 38.9 % (ref 14.0–48.0)
MCHC: 33.1 g/dL (ref 32.0–36.0)
MONO#: 0.4 10*3/uL (ref 0.1–0.9)
NEUT%: 48.1 % (ref 39.6–80.0)
Platelets: 244 10*3/uL (ref 145–400)
RDW: 11.9 % (ref 10.5–14.6)
WBC: 4.5 10*3/uL (ref 3.9–10.0)

## 2009-10-30 ENCOUNTER — Ambulatory Visit: Payer: Self-pay | Admitting: Oncology

## 2009-11-28 ENCOUNTER — Ambulatory Visit: Payer: Self-pay | Admitting: Oncology

## 2009-11-29 LAB — CBC WITH DIFFERENTIAL (CANCER CENTER ONLY)
Eosinophils Absolute: 0.2 10*3/uL (ref 0.0–0.5)
LYMPH#: 1.5 10*3/uL (ref 0.9–3.3)
LYMPH%: 26.4 % (ref 14.0–48.0)
MONO#: 0.5 10*3/uL (ref 0.1–0.9)
MONO%: 8.5 % (ref 0.0–13.0)
NEUT%: 60.9 % (ref 39.6–80.0)
WBC: 5.6 10*3/uL (ref 3.9–10.0)

## 2009-12-22 LAB — CBC WITH DIFFERENTIAL (CANCER CENTER ONLY)
BASO%: 0.4 % (ref 0.0–2.0)
Eosinophils Absolute: 0.1 10*3/uL (ref 0.0–0.5)
HGB: 10.5 g/dL — ABNORMAL LOW (ref 11.6–15.9)
LYMPH%: 31.2 % (ref 14.0–48.0)
MCH: 33.2 pg (ref 26.0–34.0)
MCHC: 34 g/dL (ref 32.0–36.0)
MONO#: 0.5 10*3/uL (ref 0.1–0.9)
NEUT%: 57.5 % (ref 39.6–80.0)
RBC: 3.17 10*6/uL — ABNORMAL LOW (ref 3.70–5.32)
WBC: 5.7 10*3/uL (ref 3.9–10.0)

## 2009-12-22 LAB — IRON AND TIBC
%SAT: 16 % — ABNORMAL LOW (ref 20–55)
TIBC: 420 ug/dL (ref 250–470)

## 2009-12-22 LAB — FERRITIN: Ferritin: 45 ng/mL (ref 10–291)

## 2010-01-17 ENCOUNTER — Ambulatory Visit: Payer: Self-pay | Admitting: Oncology

## 2010-01-19 LAB — CBC WITH DIFFERENTIAL (CANCER CENTER ONLY)
Eosinophils Absolute: 0.1 10*3/uL (ref 0.0–0.5)
HGB: 12.6 g/dL (ref 11.6–15.9)
LYMPH#: 1.5 10*3/uL (ref 0.9–3.3)
LYMPH%: 36.1 % (ref 14.0–48.0)
MCHC: 34.4 g/dL (ref 32.0–36.0)
MCV: 100 fL (ref 81–101)
MONO#: 0.3 10*3/uL (ref 0.1–0.9)
Platelets: 234 10*3/uL (ref 145–400)
RBC: 3.67 10*6/uL — ABNORMAL LOW (ref 3.70–5.32)
WBC: 4.1 10*3/uL (ref 3.9–10.0)

## 2010-02-16 ENCOUNTER — Ambulatory Visit: Payer: Self-pay | Admitting: Oncology

## 2010-02-16 LAB — CBC WITH DIFFERENTIAL (CANCER CENTER ONLY)
BASO%: 0.5 % (ref 0.0–2.0)
EOS%: 2.2 % (ref 0.0–7.0)
Eosinophils Absolute: 0.1 10*3/uL (ref 0.0–0.5)
HCT: 30.6 % — ABNORMAL LOW (ref 34.8–46.6)
HGB: 10.2 g/dL — ABNORMAL LOW (ref 11.6–15.9)
LYMPH#: 1.6 10*3/uL (ref 0.9–3.3)
MCV: 100 fL (ref 81–101)
NEUT%: 61.9 % (ref 39.6–80.0)
RDW: 12.7 % (ref 10.5–14.6)
WBC: 5.8 10*3/uL (ref 3.9–10.0)

## 2010-02-23 ENCOUNTER — Emergency Department (HOSPITAL_COMMUNITY): Admission: EM | Admit: 2010-02-23 | Discharge: 2010-02-23 | Payer: Self-pay | Admitting: Emergency Medicine

## 2010-03-16 LAB — CBC WITH DIFFERENTIAL/PLATELET
BASO%: 0.6 % (ref 0.0–2.0)
Eosinophils Absolute: 0.1 10*3/uL (ref 0.0–0.5)
HCT: 36.2 % (ref 34.8–46.6)
HGB: 12.3 g/dL (ref 11.6–15.9)
LYMPH%: 32.9 % (ref 14.0–49.7)
MCH: 34.3 pg — ABNORMAL HIGH (ref 25.1–34.0)
MONO#: 0.6 10*3/uL (ref 0.1–0.9)
NEUT#: 2.1 10*3/uL (ref 1.5–6.5)
Platelets: 265 10*3/uL (ref 145–400)
RBC: 3.57 10*6/uL — ABNORMAL LOW (ref 3.70–5.45)
WBC: 4.2 10*3/uL (ref 3.9–10.3)
lymph#: 1.4 10*3/uL (ref 0.9–3.3)

## 2010-04-05 ENCOUNTER — Ambulatory Visit: Payer: Self-pay | Admitting: Oncology

## 2010-04-12 LAB — CBC WITH DIFFERENTIAL (CANCER CENTER ONLY)
BASO%: 0.4 % (ref 0.0–2.0)
EOS%: 3.8 % (ref 0.0–7.0)
Eosinophils Absolute: 0.2 10*3/uL (ref 0.0–0.5)
HCT: 33.2 % — ABNORMAL LOW (ref 34.8–46.6)
LYMPH#: 1.4 10*3/uL (ref 0.9–3.3)
LYMPH%: 29.9 % (ref 14.0–48.0)
MCH: 33.9 pg (ref 26.0–34.0)
MONO#: 0.6 10*3/uL (ref 0.1–0.9)
NEUT%: 53.3 % (ref 39.6–80.0)
RDW: 11.2 % (ref 10.5–14.6)
WBC: 4.5 10*3/uL (ref 3.9–10.0)

## 2010-04-12 LAB — CMP (CANCER CENTER ONLY)
ALT(SGPT): 69 U/L — ABNORMAL HIGH (ref 10–47)
CO2: 26 mEq/L (ref 18–33)
Calcium: 9.8 mg/dL (ref 8.0–10.3)
Glucose, Bld: 97 mg/dL (ref 73–118)
Potassium: 4.8 mEq/L — ABNORMAL HIGH (ref 3.3–4.7)
Sodium: 138 mEq/L (ref 128–145)

## 2010-05-09 ENCOUNTER — Ambulatory Visit: Payer: Self-pay | Admitting: Oncology

## 2010-05-11 LAB — CBC WITH DIFFERENTIAL (CANCER CENTER ONLY)
BASO#: 0 10*3/uL (ref 0.0–0.2)
HCT: 36.8 % (ref 34.8–46.6)
LYMPH#: 1.5 10*3/uL (ref 0.9–3.3)
NEUT#: 1.9 10*3/uL (ref 1.5–6.5)
NEUT%: 46.3 % (ref 39.6–80.0)
Platelets: 239 10*3/uL (ref 145–400)
WBC: 4 10*3/uL (ref 3.9–10.0)

## 2010-06-14 ENCOUNTER — Ambulatory Visit: Payer: Self-pay | Admitting: Oncology

## 2010-06-15 LAB — CBC WITH DIFFERENTIAL (CANCER CENTER ONLY)
EOS%: 2.9 % (ref 0.0–7.0)
Eosinophils Absolute: 0.1 10*3/uL (ref 0.0–0.5)
HGB: 12 g/dL (ref 11.6–15.9)
LYMPH#: 1.2 10*3/uL (ref 0.9–3.3)
LYMPH%: 34.1 % (ref 14.0–48.0)
MCH: 32.4 pg (ref 26.0–34.0)
MCHC: 34.3 g/dL (ref 32.0–36.0)
MONO#: 0.4 10*3/uL (ref 0.1–0.9)
MONO%: 11 % (ref 0.0–13.0)
NEUT#: 1.8 10*3/uL (ref 1.5–6.5)
Platelets: 221 10*3/uL (ref 145–400)
WBC: 3.4 10*3/uL — ABNORMAL LOW (ref 3.9–10.0)

## 2010-07-13 LAB — CBC WITH DIFFERENTIAL (CANCER CENTER ONLY)
BASO#: 0.1 10*3/uL (ref 0.0–0.2)
BASO%: 0.6 % (ref 0.0–2.0)
Eosinophils Absolute: 0.1 10*3/uL (ref 0.0–0.5)
HCT: 32 % — ABNORMAL LOW (ref 34.8–46.6)
MCH: 33.2 pg (ref 26.0–34.0)
MONO#: 0.8 10*3/uL (ref 0.1–0.9)
RBC: 3.32 10*6/uL — ABNORMAL LOW (ref 3.70–5.32)
RDW: 12.7 % (ref 10.5–14.6)
WBC: 12.7 10*3/uL — ABNORMAL HIGH (ref 3.9–10.0)

## 2010-08-08 ENCOUNTER — Ambulatory Visit: Payer: Self-pay | Admitting: Oncology

## 2010-08-10 LAB — CBC WITH DIFFERENTIAL (CANCER CENTER ONLY)
EOS%: 3.2 % (ref 0.0–7.0)
HCT: 35.1 % (ref 34.8–46.6)
LYMPH#: 1.3 10*3/uL (ref 0.9–3.3)
MCH: 33.7 pg (ref 26.0–34.0)
MCV: 99 fL (ref 81–101)
MONO#: 0.3 10*3/uL (ref 0.1–0.9)
MONO%: 7.6 % (ref 0.0–13.0)
NEUT#: 1.9 10*3/uL (ref 1.5–6.5)
NEUT%: 52.7 % (ref 39.6–80.0)
RBC: 3.56 10*6/uL — ABNORMAL LOW (ref 3.70–5.32)

## 2010-09-06 LAB — CBC WITH DIFFERENTIAL (CANCER CENTER ONLY)
BASO#: 0 10*3/uL (ref 0.0–0.2)
EOS%: 5 % (ref 0.0–7.0)
LYMPH#: 1.1 10*3/uL (ref 0.9–3.3)
LYMPH%: 35 % (ref 14.0–48.0)
MCH: 34.8 pg — ABNORMAL HIGH (ref 26.0–34.0)
MONO%: 12.2 % (ref 0.0–13.0)
NEUT%: 47.6 % (ref 39.6–80.0)
Platelets: 210 10*3/uL (ref 145–400)

## 2010-10-02 ENCOUNTER — Ambulatory Visit: Payer: Self-pay | Admitting: Oncology

## 2010-10-04 LAB — CBC WITH DIFFERENTIAL/PLATELET
BASO%: 0.9 % (ref 0.0–2.0)
Basophils Absolute: 0 10*3/uL (ref 0.0–0.1)
Eosinophils Absolute: 0.1 10*3/uL (ref 0.0–0.5)
LYMPH%: 35.2 % (ref 14.0–49.7)
MONO#: 0.5 10*3/uL (ref 0.1–0.9)
MONO%: 11.7 % (ref 0.0–14.0)
NEUT#: 2.1 10*3/uL (ref 1.5–6.5)
Platelets: 177 10*3/uL (ref 145–400)
RBC: 3.65 10*6/uL — ABNORMAL LOW (ref 3.70–5.45)
RDW: 13.5 % (ref 11.2–14.5)

## 2010-11-02 LAB — CBC WITH DIFFERENTIAL/PLATELET
BASO%: 0.1 % (ref 0.0–2.0)
Basophils Absolute: 0 10*3/uL (ref 0.0–0.1)
EOS%: 0.4 % (ref 0.0–7.0)
Eosinophils Absolute: 0 10*3/uL (ref 0.0–0.5)
HCT: 33.2 % — ABNORMAL LOW (ref 34.8–46.6)
HGB: 11.1 g/dL — ABNORMAL LOW (ref 11.6–15.9)
LYMPH%: 24 % (ref 14.0–49.7)
MCH: 33.4 pg (ref 25.1–34.0)
MCHC: 33.4 g/dL (ref 31.5–36.0)
MCV: 100 fL (ref 79.5–101.0)
MONO#: 1.1 10*3/uL — ABNORMAL HIGH (ref 0.1–0.9)
MONO%: 13.7 % (ref 0.0–14.0)
NEUT#: 5 10*3/uL (ref 1.5–6.5)
NEUT%: 61.8 % (ref 38.4–76.8)
Platelets: 222 10*3/uL (ref 145–400)
RBC: 3.32 10*6/uL — ABNORMAL LOW (ref 3.70–5.45)
RDW: 14.9 % — ABNORMAL HIGH (ref 11.2–14.5)
WBC: 8 10*3/uL (ref 3.9–10.3)
lymph#: 1.9 10*3/uL (ref 0.9–3.3)
nRBC: 0 % (ref 0–0)

## 2010-11-10 ENCOUNTER — Encounter: Payer: Self-pay | Admitting: Gastroenterology

## 2010-11-29 ENCOUNTER — Encounter (HOSPITAL_BASED_OUTPATIENT_CLINIC_OR_DEPARTMENT_OTHER): Payer: PRIVATE HEALTH INSURANCE | Admitting: Oncology

## 2010-11-29 ENCOUNTER — Other Ambulatory Visit: Payer: Self-pay | Admitting: Oncology

## 2010-11-29 DIAGNOSIS — D638 Anemia in other chronic diseases classified elsewhere: Secondary | ICD-10-CM

## 2010-11-29 LAB — CBC WITH DIFFERENTIAL/PLATELET
BASO%: 0.3 % (ref 0.0–2.0)
Basophils Absolute: 0 10*3/uL (ref 0.0–0.1)
HGB: 12 g/dL (ref 11.6–15.9)
MCHC: 33.6 g/dL (ref 31.5–36.0)
MCV: 100.6 fL (ref 79.5–101.0)
MONO#: 0.7 10*3/uL (ref 0.1–0.9)
MONO%: 12.1 % (ref 0.0–14.0)
NEUT#: 4 10*3/uL (ref 1.5–6.5)
NEUT%: 70.6 % (ref 38.4–76.8)
RBC: 3.56 10*6/uL — ABNORMAL LOW (ref 3.70–5.45)
RDW: 14.6 % — ABNORMAL HIGH (ref 11.2–14.5)
lymph#: 0.8 10*3/uL — ABNORMAL LOW (ref 0.9–3.3)

## 2010-12-27 ENCOUNTER — Other Ambulatory Visit: Payer: Self-pay | Admitting: Oncology

## 2010-12-27 ENCOUNTER — Encounter (HOSPITAL_BASED_OUTPATIENT_CLINIC_OR_DEPARTMENT_OTHER): Payer: PRIVATE HEALTH INSURANCE | Admitting: Oncology

## 2010-12-27 DIAGNOSIS — I10 Essential (primary) hypertension: Secondary | ICD-10-CM

## 2010-12-27 DIAGNOSIS — M129 Arthropathy, unspecified: Secondary | ICD-10-CM

## 2010-12-27 DIAGNOSIS — D638 Anemia in other chronic diseases classified elsewhere: Secondary | ICD-10-CM

## 2010-12-27 LAB — CBC WITH DIFFERENTIAL/PLATELET
Basophils Absolute: 0 10*3/uL (ref 0.0–0.1)
LYMPH%: 30.8 % (ref 14.0–49.7)
MCH: 32.5 pg (ref 25.1–34.0)
MCHC: 33.4 g/dL (ref 31.5–36.0)
MCV: 97.2 fL (ref 79.5–101.0)
MONO#: 0.5 10*3/uL (ref 0.1–0.9)
lymph#: 1.4 10*3/uL (ref 0.9–3.3)
nRBC: 0 % (ref 0–0)

## 2010-12-27 LAB — COMPREHENSIVE METABOLIC PANEL
AST: 52 U/L — ABNORMAL HIGH (ref 0–37)
Alkaline Phosphatase: 96 U/L (ref 39–117)
BUN: 21 mg/dL (ref 6–23)
CO2: 24 mEq/L (ref 19–32)
Calcium: 9.1 mg/dL (ref 8.4–10.5)
Chloride: 105 mEq/L (ref 96–112)
Potassium: 4.5 mEq/L (ref 3.5–5.3)

## 2010-12-27 LAB — IRON AND TIBC
Iron: 145 ug/dL (ref 42–145)
TIBC: 407 ug/dL (ref 250–470)

## 2010-12-27 LAB — FERRITIN: Ferritin: 91 ng/mL (ref 10–291)

## 2011-01-08 LAB — POCT I-STAT, CHEM 8
BUN: 23 mg/dL (ref 6–23)
Calcium, Ion: 1.2 mmol/L (ref 1.12–1.32)
Chloride: 108 mEq/L (ref 96–112)
Glucose, Bld: 103 mg/dL — ABNORMAL HIGH (ref 70–99)

## 2011-01-08 LAB — DIFFERENTIAL
Lymphocytes Relative: 10 % — ABNORMAL LOW (ref 12–46)
Monocytes Absolute: 0.6 10*3/uL (ref 0.1–1.0)
Monocytes Relative: 8 % (ref 3–12)
Neutro Abs: 5.6 10*3/uL (ref 1.7–7.7)

## 2011-01-08 LAB — URINALYSIS, ROUTINE W REFLEX MICROSCOPIC
Protein, ur: NEGATIVE mg/dL
Urobilinogen, UA: 0.2 mg/dL (ref 0.0–1.0)

## 2011-01-08 LAB — CBC
Hemoglobin: 10.6 g/dL — ABNORMAL LOW (ref 12.0–15.0)
RBC: 2.96 MIL/uL — ABNORMAL LOW (ref 3.87–5.11)

## 2011-01-08 LAB — URINE MICROSCOPIC-ADD ON

## 2011-01-08 LAB — GLUCOSE, CAPILLARY: Glucose-Capillary: 95 mg/dL (ref 70–99)

## 2011-01-24 ENCOUNTER — Encounter (HOSPITAL_BASED_OUTPATIENT_CLINIC_OR_DEPARTMENT_OTHER): Payer: PRIVATE HEALTH INSURANCE | Admitting: Oncology

## 2011-01-24 ENCOUNTER — Other Ambulatory Visit: Payer: Self-pay | Admitting: Oncology

## 2011-01-24 DIAGNOSIS — D638 Anemia in other chronic diseases classified elsewhere: Secondary | ICD-10-CM

## 2011-01-24 LAB — CBC WITH DIFFERENTIAL/PLATELET
BASO%: 0.4 % (ref 0.0–2.0)
Basophils Absolute: 0 10*3/uL (ref 0.0–0.1)
HCT: 36.2 % (ref 34.8–46.6)
LYMPH%: 26.3 % (ref 14.0–49.7)
MCH: 32.3 pg (ref 25.1–34.0)
MCHC: 32.9 g/dL (ref 31.5–36.0)
MONO#: 0.6 10*3/uL (ref 0.1–0.9)
NEUT%: 59.2 % (ref 38.4–76.8)
Platelets: 221 10*3/uL (ref 145–400)
WBC: 4.6 10*3/uL (ref 3.9–10.3)

## 2011-02-21 ENCOUNTER — Other Ambulatory Visit: Payer: Self-pay | Admitting: Oncology

## 2011-02-21 ENCOUNTER — Encounter (HOSPITAL_BASED_OUTPATIENT_CLINIC_OR_DEPARTMENT_OTHER): Payer: PRIVATE HEALTH INSURANCE | Admitting: Oncology

## 2011-02-21 DIAGNOSIS — M129 Arthropathy, unspecified: Secondary | ICD-10-CM

## 2011-02-21 DIAGNOSIS — I10 Essential (primary) hypertension: Secondary | ICD-10-CM

## 2011-02-21 DIAGNOSIS — D638 Anemia in other chronic diseases classified elsewhere: Secondary | ICD-10-CM

## 2011-02-21 LAB — CBC WITH DIFFERENTIAL/PLATELET
BASO%: 0.5 % (ref 0.0–2.0)
EOS%: 3.6 % (ref 0.0–7.0)
HCT: 32.5 % — ABNORMAL LOW (ref 34.8–46.6)
LYMPH%: 31.9 % (ref 14.0–49.7)
MCH: 32.5 pg (ref 25.1–34.0)
MCHC: 33.5 g/dL (ref 31.5–36.0)
MCV: 97 fL (ref 79.5–101.0)
MONO%: 11.6 % (ref 0.0–14.0)
NEUT%: 52.4 % (ref 38.4–76.8)
lymph#: 1.4 10*3/uL (ref 0.9–3.3)

## 2011-03-05 NOTE — H&P (Signed)
[name], [name] NO.:  [id number]   MEDICAL RECORD NO.:  [id number]          PATIENT TYPE:  INP   LOCATION:  2013                         FACILITY:  [facility]   PHYSICIAN:  Lonia Blood, M.D.      DATE OF BIRTH:  1931-11-17   DATE OF ADMISSION:  06/25/2008  DATE OF DISCHARGE:                              HISTORY & PHYSICAL   PRIMARY CARE PHYSICIAN:  Renaye Rakers, MD   PRESENTING COMPLAINT:  Chest pain.   HISTORY OF PRESENT ILLNESS:  The patient is a 77 year old female with  history of hypertension and anemia, who has apparently been doing okay  until yesterday when she started having retrosternal chest pain.  Pain  is described as heaviness rather than being sharp, mainly retrosternal  in nature.  No radiation.  It was graded as 6-7/10.  She denied any  diaphoresis.  Denied any nausea or vomiting.  The patient initially  thought it was acid reflux and she took some Rolaids.  It went away a  short while, but then it came back ferociously.  This morning she took  some banana and orange juice as usual and her chest pain got worse.  She  had associated shortness of breath.  Also, difficulty keeping up with  her breath.  Denied any dizziness.  Denied any headaches.  No fever.  No  cough.   Her past medical history is significant for,  1. Anemia.  2. Hypertension.  3. Osteoporosis.   ALLERGIES:  She has no known drug allergies.   Medications include calcium with vitamin D, Atacand, Feosol,  multivitamins, Norvasc, and Uloric.   SOCIAL HISTORY:  The patient lives in [place].  She smoked up to  half a pack per day, but quit a few months ago.  Denied any alcohol or  IV drug use.   FAMILY HISTORY:  Mainly hypertension.  Her grandmother died at the age  of 54 from hypertension complications.  Denied any history of coronary  artery disease in the family.   REVIEW OF SYSTEMS:  A 12-point review of systems is negative except per  HPI.  The patient complained  of generalized weakness.   PHYSICAL EXAMINATION:  VITAL SIGNS:  Temperature is 99.2, blood pressure  124/66.  Blood pressure drops briefly to 84/37.  Her pulses was 59,  respiratory rate 17, and sats 100% of 2 L.  GENERAL:  The patient is awake, alert, oriented, in no acute distress.  HEENT:  PERRL.  EOMI.  NECK:  Supple.  No JVD.  No lymphadenopathy.  RESPIRATORY:  She has good air entry bilaterally.  No wheezes.  No  rales.  CARDIOVASCULAR:  The patient has S1 and S2.  No murmurs.  No rales.  No  rub.  ABDOMEN:  Soft and nontender with positive bowel sounds.  EXTREMITIES:  No edema, cyanosis, or clubbing.   LABORATORY DATA:  Sodium 135, potassium 4.5, chloride 105, BUN 90,  creatinine 1.4, glucose 105, and calcium 1.14.  White count is 4.7 and  hemoglobin 11.7 with platelet count of 243.  Initial cardiac enzymes  were negative.  Chest x-ray showed no  acute cardiopulmonary process with  possible COPD.  EKG showed normal sinus rhythm with a rate of 52.  No  significant ST-T wave changes.  Slightly changed from EKG of May 16, 2008.   ASSESSMENT:  This is a 77 year old female presenting with chest pain,  shortness of breath, and bradycardia.  The patient has risk factors of  coronary artery disease, mainly hypertension and recent tobacco use.  The patient claimed that her cholesterol status is good.   PLAN:  1. Chest pain.  We will admit the patient for at least MI rule out and      also need to rule out PE.  Dissection is less likely based on chest      x-ray findings, although that is not completely ruled out without      doing a CT.  Especially TI is less likely also based on the chest x-      ray findings.  At this point, therefore we will check serial      cardiac enzymes, TSH, and fasting lipid panel.  Check a D-dimer at      this point.  If it is elevated, I will proceed with a CT chest to      rule out PE.  If all the tests come back as normal, this could be a       variant of some acid reflux.  The patient will then be permanently      on the PPI.  2. Hypertension.  Blood pressure seems alright.  Her blood pressure      has dropped with the use of nitro for her chest pain.  3. Bradycardia.  It is not quite clear what causes the bradycardia at      this point.  We will monitor the patient on tele and if this      persists, we will do a 2-D echo and probably get some EP involved.  4. Anemia.  The patient gets apparently monthly shots indicating that      this is probably B12 deficiency anemia, but she is also on iron      supplementation.  Her hemoglobin today is reasonably okay at 11.7.      So further management of her anemia will be continued as an      outpatient basis.  Otherwise, further treatment will depend on the      patient's response to management in the hospital.      Lonia Blood, M.D.  Electronically Signed     LG/MEDQ  D:  06/25/2008  T:  06/25/2008  Job:  [id number]

## 2011-03-05 NOTE — Op Note (Signed)
NAMEMarland [name]  KIELA, SHISLER NO.:  [id number]   MEDICAL RECORD NO.:  [id number]          PATIENT TYPE:  INP   LOCATION:  4703                         FACILITY:  [facility]   PHYSICIAN:  Anselmo Rod, M.D.  DATE OF BIRTH:  1932-01-08   DATE OF PROCEDURE:  05/17/2008  DATE OF DISCHARGE:                               OPERATIVE REPORT   PROCEDURE PERFORMED:  Esophagogastroduodenoscopy with small-bowel  biopsies.   ENDOSCOPIST:  Anselmo Rod, M.D.   INSTRUMENT USED:  Pentax video panendoscope.   INDICATIONS FOR PROCEDURE:  A 77 year old black female undergoing EGD  for guaiac-positive stools and iron-deficiency anemia.  The patient  receiving a blood transfusion now, hemoglobin is down to 7.9 gm/dL.  Rule out peptic ulcer disease, esophagitis, gastritis, etc.   PREPROCEDURE PREPARATION:  Informed consent was procured from the  patient.  The patient fasted for 4 hours prior to procedure.  Risks and  benefits of the procedure were discussed with the patient in great  detail.   PREPROCEDURE PHYSICAL:  VITAL SIGNS:  The patient had stable vital  signs.  NECK:  Supple.  CHEST:  Clear to auscultation.  HEART:  S1 and S2 regular.  ABDOMEN:  Soft with normal bowel sounds.   DESCRIPTION OF PROCEDURE:  The patient was placed in the left lateral  decubitus position and sedated with 50 mcg of Fentanyl and 4 mg of  Versed given intravenously in slow incremental doses.  Once the patient  was adequately sedated and maintained on low-flow oxygen and continuous  cardiac monitoring, the Pentax video panendoscope was advanced through  the mouthpiece over the tongue into the esophagus under direct vision.  The vocal cords appeared healthy.  The entire esophagus was widely  patent with no evidence of ring, stricture, masses, esophagitis or  Barrett's mucosa.  The Z-line appeared healthy.  The scope was then  advanced into the stomach.  Small hiatal hernia was seen on high  retroflexion.  Multiple nonbleeding gastric AVMs were noted in the high  cardia and a small AVM was noted in the antrum.  These were not ablated  as they did not seem to be bleeding.  The proximal small bowel appeared  normal.  Small-bowel biopsies were done to rule out sprue.  Hiatal  hernia was seen on high retroflexion.   IMPRESSION:  1. Normal-appearing vocal cords esophagus and gastroesophageal      junction.  2. Small hiatal hernia seen on high retroflexion.  3. Multiple nonbleeding gastric arteriovenous malformations.  4. Normal proximal small bowel.  Small-bowel biopsies done to rule out      sprue.  5. No masses, polyps, ulcers or erosions noted.   RECOMMENDATIONS:  1. Await pathology results.  2. Avoid all nonsteroidals for now.  3. Proceed with a colonoscopy at this time.  4. Further recommendations to be made thereafter.  5. Continue PPI for now.      Anselmo Rod, M.D.  Electronically Signed     JNM/MEDQ  D:  05/17/2008  T:  05/17/2008  Job:  [id number]   cc:   Renaye Rakers, M.D.  Drue Second, MD

## 2011-03-05 NOTE — H&P (Signed)
NAMEMarland Kitchen  Kelly, [name] NO.:  [id number]   MEDICAL RECORD NO.:  [id number]          PATIENT TYPE:  INP   LOCATION:  1843                         FACILITY:  [facility]   PHYSICIAN:  Elliot Cousin, M.D.    DATE OF BIRTH:  10/23/1931   DATE OF ADMISSION:  05/16/2008  DATE OF DISCHARGE:                              HISTORY & PHYSICAL   PRIMARY CARE PHYSICIAN:  Dr. Renaye Rakers.   HEMATOLOGIST:  Dr. Welton Flakes.   CHIEF COMPLAINT:  I do not have any energy.   HISTORY OF PRESENT ILLNESS:  The patient is a 77 year old woman with a  past medical history significant for iron deficiency anemia and  hypertension, who presents to the emergency department with a chief  complaint of fatigue.  Her fatigue started approximately 4 days ago.  She says that she also becomes short of breath with activity. She tires  easily when her hemoglobin falls.  She denies chest pain, fever, chills,  swelling in her legs, or upper respiratory infection symptoms.  She  denies abdominal pain.  She says that she has chronically dark colored  stools from taking iron daily.  She denies bright red blood per rectum.  She denies nausea, vomiting or diarrhea.  She denies painful urination.  She was recently started on Vicodin approximately 2 weeks ago for  arthritic pain.  She says that the Vicodin makes her feel funny and  she takes it sparingly.  She is treated by Hematologist, Dr. Welton Flakes, for  chronic anemia.  Apparently, she gets a shot once monthly from Dr.  Welton Flakes.  She reports that her hemoglobin was 13 approximately two weeks  ago.   During the evaluation in the emergency department, the patient is noted  to be hemodynamically stable although she was initially tachycardiac  with a heart rate of 116 beats per minute.  Her heart rate is now 96  beats per minute.  Her lab data are significant for a hemoglobin of 9.3,  MCV of 101.3, and a creatinine of 1.44.  Her chest x-ray reveals  emphysema, arthrosclerosis,  but no acute cardiopulmonary findings.  Her  EKG reveals normal sinus rhythm with a heart rate of 89 beats per  minute.  The patient will be admitted for 24-hour observation.   PAST MEDICAL HISTORY:  1. Iron deficiency anemia, followed by Hematologist, Dr. Welton Flakes.  2. Inadequate preparation for colonoscopy in 2004 by Dr. Loreta Ave.  3. Atrophic gastritis per EGD by Dr. Loreta Ave in November of 2004.  4. Normal small bowel follow-through in November of 2004.  5. Hypertension.  6. No acute abnormalities per CT scan of the abdomen and pelvis in      July of 2009.  7. Gout.  8. Degenerative joint disease.  9. Alcohol use.  10.Status post hysterectomy and questionable oophorectomy secondary to      fibroids.  11.Status post appendectomy in the past.   MEDICATIONS:  1. Amlodipine 5 mg b.i.d.  2. Uloric 40 mg daily.  3. Vicodin 7.5 mg every 8-12 hours p.r.n. pain.  4. Aspirin 81 mg daily.  5. Atacand/HCTZ 32/12.5 mg daily.  6. Iron  325 mg b.i.d.  7. Calcium with vitamin D 600 mg daily.   ALLERGIES:  NO KNOWN DRUG ALLERGIES.   SOCIAL HISTORY:  The patient is widowed.  She has two sons.  [place]  She [place] disability.  She stopped  smoking approximately 2-3 years ago after smoking one pack of cigarettes  per day for approximately 50 years.  She drinks 3 beers a day.  She  denies illicit drug use.  She does not drive.   FAMILY HISTORY:  Her mother died of tuberculosis.  Her father died of  prostate cancer.   REVIEW OF SYSTEMS:  As above in the history of present illness.   EXAM:  Temperature 98.9, blood pressure 105/64, pulse 96, respiratory  rate 16, oxygen saturation 98% on room air.  GENERAL:  The patient is a pleasant, alert 77 year old [place] American  woman who is currently lying in bed in no acute distress.  HEENT: Head is normocephalic/nontraumatic; pupils equal, round and  reactive to light; extraocular movements are intact, conjunctivae are   clear, sclerae are white, nasal mucosa is mildly dry, no sinus  tenderness.  Oropharynx reveals mildly dry mucous membranes.  No  posterior exudates or erythema.  NECK:  Supple, no adenopathy, no thyromegaly, no bruit, no JVD.  LUNGS:  Decreased breath sounds at the bases, otherwise clear.  Breathing is nonlabored.  HEART:  S1-S2 with a soft systolic murmur.  ABDOMEN:  Positive bowel sounds, soft, nontender, nondistended, no  hepatosplenomegaly, no masses palpated.  RECTAL:  Deferred. Per the registered nurse in the emergency department,  the patient's stool was dark green and heme positive.  EXTREMITIES:  Pedal pulses barely palpable bilaterally.  No pretibial  edema and no pedal edema.  NEUROLOGIC:  The patient is alert and oriented x3, cranial nerves II-XII  are intact.  Strength is 5/5 throughout.  Sensation is intact.   ADMISSION LABORATORIES:  Chest X-Ray and EKG results are above.  Stool  Hemoccult, positive.  Urinalysis negative except small leukocytes and 3-  6 WBCs.  Sodium 138, potassium 4.3, chloride 105, CO2 25, glucose 122,  BUN 18, creatinine 1.44 calcium 10, WBC 6.4, hemoglobin 9.3, hematocrit  26.9, MCV 101.3, platelets 235.   ASSESSMENT:  1. Fatigue.  The etiology is unclear at this time; will consider      subacute to chronic anemia, although she is not profoundly anemic.      We will need to rule out thyroid disease and/or an anginal      equivalent.  The patient does have evidence of emphysema on the      chest x-ray which may be a contributing factor.  She is currently      afebrile and her white blood cell count is within normal limits and      therefore infection is less likely.  The patient's stool is guaiac      positive but the heme positivity may be the consequence of chronic      iron therapy.  2. Macrocytic Anemia.  The patient's MCV is 101.3. There has been a 4      gram drop in her hemoglobin in 2 weeks, per history.  She      acknowledges drinking 3  beers daily.  We will need to rule out      vitamin B12 and folate deficiencies.  Of note, the patient is      followed by Hematologist, Dr. Welton Flakes, and apparently receives either  Aranesp or Epogen once monthly.  Records from Dr. Milta Deiters office      will be sent over shortly.  3. Pyuria.  The patient has no complaints of dysuria.  4. Hypertension.  The patient's blood pressure is relatively low,      although grossly within normal limits.  She is treated with 3      antihypertensive medications including Atacand, HCTZ, and Norvasc.  5. Alcohol use.  It is unclear whether or not the patient actually      abuses alcohol.  However, we will watch for withdrawal symptoms.   PLAN:  1. The patient will be admitted for 24-hour observation.  2. Will consult Gastroenterologist Dr. Loreta Ave or colleague.  Of note,      apparently the colonoscopy that was attempted in 2004 was more or      less non-diagnostic because of poor preparation.  3. The patient's hemoglobin and hematocrit will be monitored closely.  4. Will check her TSH, cardiac enzymes, vitamin B12, and RBC folate.  5. Will  obtain records from Dr. Welton Flakes.  6. Will hold aspirin for 24-48 hours.  7. Will start gentle IV fluids.  Will hold Atacand/HCTZ for now.      Elliot Cousin, M.D.  Electronically Signed     DF/MEDQ  D:  05/16/2008  T:  05/16/2008  Job:  [id number]   cc:   Renaye Rakers, M.D.  Dr. Wende Mott, M.D.

## 2011-03-05 NOTE — Consult Note (Signed)
[name], [name] NO.:  [id number]   MEDICAL RECORD NO.:  [id number]          PATIENT TYPE:  INP   LOCATION:  4703                         FACILITY:  [facility]   PHYSICIAN:  Anselmo Rod, M.D.  DATE OF BIRTH:  1932-03-04   DATE OF CONSULTATION:  05/16/2008  DATE OF DISCHARGE:                                 CONSULTATION   REASON FOR CONSULTATION:  Anemia with guaiac positive stools in a 77-  year-old [place] American female.   HISTORY OF PRESENT ILLNESS:  [name] is a 77 year old  African American female who is known to me since her initial evaluation  at the office in November of 2004.  The patient had an EGD and  colonoscopy done at the time.  There was limited regionalization of the  colon because of inadequate prep but EGD showed atrophic gastritis with  small bowel follow-through normal.  The patient at that time described  questionable history of colon cancer in her father but today gives a  history of prostate cancer in her father and states he did not have  colon cancer.   REVIEW OF SYSTEMS:  The patient has been fatigued for the last week even  though she has been on iron supplements for several years now.  There is  no history of nausea, vomiting, fever, chills, or rigors, abdominal  pain, change in bowel habits.  She has occasional constipation.  Her  appetite is good.  Weight has been stable.  There is no history of  ulcer, jaundice, colitis or genitourinary or cardiorespiratory  complaints.   PAST MEDICAL HISTORY:  1. Gout.  2. DJD.  3. Atrophic gastritis.  4. Alcohol abuse.  5. Hypertension.  6. S/P oophorectomy.  7. S/P hysterectomy.  8. S/P appendectomy.  9. Bilateral foot spurs.  10.NVD x5 in the past.   ALLERGIES:  No known drug allergies.   MEDICATIONS:  Uloric, Norvasc, aspirin, Atacand, Vicodin, and iron  supplements.   SOCIAL HISTORY:  She is a widow with 5 grown children.  She is retired.  She smokes a pack  per day for the last 35 years.  She drinks with  alcohol abuse.  She denies any illnesses or street drugs.   FAMILY HISTORY:  There is no known family history of breast, ovarian,  colon, or cervical cancer.  Her father apparently had prostate cancer.  Her mother died in her 89s of tuberculosis.  There are several family  members with hypertension.  Her maternal grandmother had a stroke.   PHYSICAL EXAMINATION:  GENERAL:  The patient is a very pleasant,  elderly, cooperative [place] female in no acute distress.  VITAL SIGNS:  Temperature 98.4, blood pressure 106/57, pulse 90 per  minute, respiratory rate 18.  She is 100% saturation on room air.  HEENT:  Atraumatic, normocephalic head.  Facial symmetry preserved.  NECK:  Supple.  CHEST:  Clear to auscultation.  HEART:  S1 and S2 regular.  ABDOMEN:  Soft with multiple surgical scars noted.  There were normal  bowel sounds.  RECTAL:  Examination deferred.   LABORATORY DATA:  Hemoglobin 9.3 grams/dl  which is fairly down from 14.2  grams in November of 2007.  White count 6.4K, platelets of 235K.  Sodium  138, potassium 4.3, BUN 18, creatinine 1.44, chloride 105, CO2 25,  glucose of 122.   ASSESSMENT/PLAN:  Anemia with guaiac positive stool.  Iron and ferritin  levels will be checked with the next blood draw.  The patient has an MCV  of 101.3. Agree with B12 and folate levels as well needed and  colonoscopy planned for tomorrow.  The patient is on Protonix as per Dr.  Foye Clock recommendation.  Further recommendations will be made in  follow-up.      Anselmo Rod, M.D.  Electronically Signed     JNM/MEDQ  D:  05/16/2008  T:  05/17/2008  Job:  [id number]   cc:   Renaye Rakers, M.D.

## 2011-03-05 NOTE — Discharge Summary (Signed)
NAMEMarland Kitchen  [name], [name] NO.:  [id number]   MEDICAL RECORD NO.:  [id number]          PATIENT TYPE:  INP   LOCATION:  2013                         FACILITY:  [facility]   PHYSICIAN:  Renee Ramus, MD       DATE OF BIRTH:  [birth year]   DATE OF ADMISSION:  06/25/2008  DATE OF DISCHARGE:  06/28/2008                               DISCHARGE SUMMARY   PRIMARY DIAGNOSIS:  Chest pain, noncardiac, more like probable  gastroesophageal reflux disease.   SECONDARY DIAGNOSES:  1. Hypertension.  2. Iron deficiency.  3. Osteoporosis.   HOSPITAL COURSE:  1. Chest pain.  The patient is a 77 year old female who was admitted      secondary to chest pain, shortness of breath, and bradycardia.  The      patient's risk factors for coronary artery disease include      postmenopausal female, hypertension, and tobacco use.  The patient      did have negative cardiac enzymes, negative EKG, and did receive a      Cardiolite stress test showing no evidence of ischemia with a good      EF at 94%, somewhat superdynamic.  The patient is now being      discharged to home with instructions to follow up with her primary      care physician as needed.  The patient also received an      echocardiogram showing left ventricular ejection fraction of 55-      60%.  No evidence of diastolic dysfunction, and no evidence of      systolic dysfunction.  2. Hypertension.  This has been relatively well controlled throughout      this hospitalization, and the patient will be discharged on her      prehospital medication regimen.  3. Osteoporosis.  The patient will continue her calcium and vitamin D      therapy.  4. Iron deficiency anemia.  The patient will continue her iron      supplements.   LABORATORY DATA OF NOTE:  1. Mild anemia with hemoglobin of 9.9, hematocrit of 29.9 with an MCV      of 100.3.  2. Initial renal failure with BUN 17, creatinine 1.5 decreasing to BUN      of 8 and creatinine of 1.16.  3. Mild elevation in AST and ALT with AST of 57, ALT of 43, and alk      phos of 124.  4. Cholesterol panel showing total cholesterol of 87, triglycerides      103, HDL 27, and LDL 39.   STUDIES:  1. Chest x-ray showing no acute disease.  2. Stress nuclear medicine myocardial SPECT test showing no evidence      of pharmacologically induced ischemia.  3. Echocardiograms previously dictated.   MEDICATIONS ON DISCHARGE:  1. Aspirin 81 mg p.o. daily, which will be discontinued at discharge      since that may be contributing to her reflux symptoms and believes      that she does not need prophylaxis at this time.  2. Calcium, Os-Cal plus D 600 one p.o. daily.  3. Atacand/hydrochlorothiazide one p.o. daily with instructions to      drink at least 1 liter of free water daily.  4. Iron replacement 325 mg p.o. b.i.d.  5. Multivitamin p.o. daily.  6. Amlodipine 5 mg p.o. b.i.d.  7. Uloric 40 mg p.o. daily.  8. Protonix 40 mg p.o. daily x7 days.   There are no labs or studies pending at time of discharge.  The patient  is in stable condition and anxious for discharge.   Time spent 35 minutes.      Renee Ramus, MD  Electronically Signed     JF/MEDQ  D:  06/28/2008  T:  06/29/2008  Job:  [id number]   cc:   Renaye Rakers, M.D.

## 2011-03-05 NOTE — Discharge Summary (Signed)
NAMEMarland Kitchen  Kelly, [name] NO.:  [id number]   MEDICAL RECORD NO.:  [id number]          PATIENT TYPE:  INP   LOCATION:  4703                         FACILITY:  [facility]   PHYSICIAN:  Beckey Rutter, MD  DATE OF BIRTH:  04-12-1932   DATE OF ADMISSION:  05/16/2008  DATE OF DISCHARGE:  05/18/2008                               DISCHARGE SUMMARY   PRIMARY CARE PHYSICIAN:  Renaye Rakers, MD   CHIEF COMPLAINT:  Weakness and decreased energy level.   HOSPITAL COURSE:  This is a 77 year old pleasant female presented  because of severe weakness.  The patient was found to have anemia on  presentation, iron deficiency anemia and also found to have guaiac  positive.  The patient was then evaluated for GI blood loss.   HOSPITAL CONSULTATIONS:  Gastroenterology, Dr. Charna Elizabeth.  The patient  underwent EGD and colonoscopy, results are as below.  The patient's  hemoglobin is 9.6 currently with improvement of her symptoms.  She is  status post 1 unit of packed RBCs.  The patient is stable for discharge  today to follow up with Dr. [name] within a week and follow up with Dr.  Charna Elizabeth to discuss the pathology result of the EGD and the  colonoscopy.   HOSPITAL PROCEDURES:  EGD was done on May 17, 2008.  The impression is,  1. Normal-appearing vocal cords, esophagus, and gastroesophageal      junction.  2. Small hiatal hernia seen on high retroflexion.  3. Multiple nonbleeding gastric arteriovenous malformations.  4. Normal proximal small bowel.  Small bowel biopsies done to rule out      sprue.  5. No masses, polyps, ulcer or erosion noted.   On May 17, 2008, the patient had colonoscopy as well with the  impression showing normal colonoscopy of the terminal ileum except for  small sessile polyp removed by cold biopsy x1 from the rectum.  No  masses, polyps, erosions, ulceration, or arteriovenous malformation  noted.   DISCHARGE DIAGNOSES:  1. Iron-deficiency anemia with  guaiac-positive.  2. Gastric arteriovenous malformation.  3. Hypertension.  4. Gout.  5. Degenerative joint disease.   DISCHARGE MEDICATIONS:  1. Norvasc 5 mg b.i.d.  2. Vicodin p.r.n.  3. Atacand/hydrochlorothiazide 35/12.5 mg daily.  4. Iron 325 mg b.i.d.  5. Calcium with vitamin D 600 mg p.o. daily.  6. Protonix 40 mg p.o. daily.  7. Uloric 40 mg p.o. daily.   Note:  The patient was taking aspirin 81 mg daily, it is discontinued.   DISCHARGE PLAN:  The patient is discharged today to follow up with Dr.  Renaye Rakers as discussed with her.  She was also supposed to follow up  with Dr. Loreta Ave to follow up with the result of the pathology for the  biopsy taken during the upper endoscopy and the colonoscopy.  Her  hemoglobin today is 9.6 and she was advised to return immediately to  emergency department if she felt she is bleeding or to contact her  physician if she is feeling weak.  The patient is aware and agreeable to  the discharge plan.  Continue on  proton pump inhibitor and the aspirin  was discontinued.  The patient was taking Uloric for her gout which is  xanthine oxidase inhibitor as per the literature.  I reviewed the  xanthine oxidase inhibitor.  Uloric is not increasing the risk of GI  bleeding.  So, the recommendation is to continue the medicine with the  dose as discussed above.      Beckey Rutter, MD  Electronically Signed     EME/MEDQ  D:  05/18/2008  T:  05/19/2008  Job:  [id number]

## 2011-03-05 NOTE — Op Note (Signed)
NAMEMarland [name]  [name], [name] NO.:  [id number]   MEDICAL RECORD NO.:  [id number]          PATIENT TYPE:  INP   LOCATION:  4703                         FACILITY:  [facility]   PHYSICIAN:  Anselmo Rod, M.D.  DATE OF BIRTH:  01-01-1932   DATE OF PROCEDURE:  05/17/2008  DATE OF DISCHARGE:                               OPERATIVE REPORT   PROCEDURE PERFORMED:  Screening colonoscopy.   ENDOSCOPIST:  Anselmo Rod, M.D.   INSTRUMENT USED:  Pentax video colonoscope.   INDICATIONS FOR PROCEDURE:  A 77 year old black female undergoing  colonoscopy for guaiac positive stool and iron-deficiency anemia.  Rule  out colonic polyps, masses, etc.  Patient gives a history of colon  cancer in her father several years ago but now tells me that he actually  in fact had prostate cancer.   PREPROCEDURE PREPARATION:  Informed consent was procured from the  patient.  The patient fasted for 4 hours prior to the procedure and  prepped with a bottle of magnesium citrate and a gallon of GoLYTELY the  night prior to the procedure.  Risks and benefits of the procedure  including a 10% miss rate of cancer and polyps was discussed with the  patient as well.   PREPROCEDURE PHYSICAL:  VITAL SIGNS:  Stable vital signs.  NECK:  Supple.  CHEST:  Clear to auscultation.  CARDIOVASCULAR:  S1 and S2 regular.  ABDOMEN:  Soft with normal bowel sounds.   DESCRIPTION OF PROCEDURE:  The patient was placed in left lateral  decubitus position, sedated with an additional 25 mcg of Fentanyl and 3  mg of Versed given intravenously in slow incremental doses.  Once the  patient was adequately sedated and maintained on low flow oxygen and  continuous cardiac monitoring, the Pentax video colonoscope was advanced  from the rectum to the cecum.  There was some residual stool in the  colon.  Multiple washings were done.  A small sessile polyp was biopsied  from the rectum.  The rest of the colonic mucosa up to the  terminal  ileum appeared healthy.  No other masses, polyps, erosions, ulcerations  or diverticula were noted.  The terminal ileum appeared healthy and  without lesions.  Retroflexion in the rectum revealed no abnormalities  except for the polyp mentioned above.  The patient tolerated the  procedure well without any complications.   IMPRESSION:  Normal colonoscopy of the terminal ileum except for a small  sessile polyp removed by cold biopsy x1 from the rectum.  No masses,  polyps, erosions, ulcerations or arteriovenous malformations noted.   RECOMMENDATIONS:  1. Await pathology results.  2. Transfuse to keep the hemoglobin between 9-10 gm/dl.  3. Outpatient follow-up within the next week.      Anselmo Rod, M.D.  Electronically Signed     JNM/MEDQ  D:  05/17/2008  T:  05/17/2008  Job:  [id number]   cc:   Renaye Rakers, M.D.  Drue Second, MD

## 2011-03-21 ENCOUNTER — Other Ambulatory Visit: Payer: Self-pay | Admitting: Oncology

## 2011-03-21 ENCOUNTER — Encounter (HOSPITAL_BASED_OUTPATIENT_CLINIC_OR_DEPARTMENT_OTHER): Payer: PRIVATE HEALTH INSURANCE | Admitting: Oncology

## 2011-03-21 DIAGNOSIS — D638 Anemia in other chronic diseases classified elsewhere: Secondary | ICD-10-CM

## 2011-03-21 LAB — CBC WITH DIFFERENTIAL/PLATELET
EOS%: 2.7 % (ref 0.0–7.0)
LYMPH%: 26.1 % (ref 14.0–49.7)
MCH: 34 pg (ref 25.1–34.0)
MCHC: 33.7 g/dL (ref 31.5–36.0)
MCV: 101 fL (ref 79.5–101.0)
MONO%: 14.3 % — ABNORMAL HIGH (ref 0.0–14.0)
Platelets: 201 10*3/uL (ref 145–400)
RBC: 3.77 10*6/uL (ref 3.70–5.45)
RDW: 13.9 % (ref 11.2–14.5)

## 2011-04-18 ENCOUNTER — Other Ambulatory Visit: Payer: Self-pay | Admitting: Oncology

## 2011-04-18 ENCOUNTER — Encounter (HOSPITAL_BASED_OUTPATIENT_CLINIC_OR_DEPARTMENT_OTHER): Payer: PRIVATE HEALTH INSURANCE | Admitting: Oncology

## 2011-04-18 DIAGNOSIS — D638 Anemia in other chronic diseases classified elsewhere: Secondary | ICD-10-CM

## 2011-04-18 LAB — CBC WITH DIFFERENTIAL/PLATELET
BASO%: 1 % (ref 0.0–2.0)
Eosinophils Absolute: 0.1 10*3/uL (ref 0.0–0.5)
LYMPH%: 35.5 % (ref 14.0–49.7)
MCHC: 34.5 g/dL (ref 31.5–36.0)
MONO#: 0.6 10*3/uL (ref 0.1–0.9)
MONO%: 13.9 % (ref 0.0–14.0)
NEUT#: 1.9 10*3/uL (ref 1.5–6.5)
RBC: 3.42 10*6/uL — ABNORMAL LOW (ref 3.70–5.45)
RDW: 13.3 % (ref 11.2–14.5)
WBC: 4.2 10*3/uL (ref 3.9–10.3)

## 2011-05-16 ENCOUNTER — Encounter (HOSPITAL_BASED_OUTPATIENT_CLINIC_OR_DEPARTMENT_OTHER): Payer: PRIVATE HEALTH INSURANCE | Admitting: Oncology

## 2011-05-16 ENCOUNTER — Other Ambulatory Visit: Payer: Self-pay | Admitting: Oncology

## 2011-05-16 DIAGNOSIS — D638 Anemia in other chronic diseases classified elsewhere: Secondary | ICD-10-CM

## 2011-05-16 LAB — CBC WITH DIFFERENTIAL/PLATELET
BASO%: 0.6 % (ref 0.0–2.0)
Eosinophils Absolute: 0.1 10*3/uL (ref 0.0–0.5)
HCT: 32.9 % — ABNORMAL LOW (ref 34.8–46.6)
LYMPH%: 26.8 % (ref 14.0–49.7)
MCHC: 34.3 g/dL (ref 31.5–36.0)
MONO#: 0.5 10*3/uL (ref 0.1–0.9)
NEUT#: 2.8 10*3/uL (ref 1.5–6.5)
Platelets: 217 10*3/uL (ref 145–400)
RBC: 3.36 10*6/uL — ABNORMAL LOW (ref 3.70–5.45)
WBC: 4.7 10*3/uL (ref 3.9–10.3)
lymph#: 1.3 10*3/uL (ref 0.9–3.3)
nRBC: 0 % (ref 0–0)

## 2011-06-13 ENCOUNTER — Other Ambulatory Visit: Payer: Self-pay | Admitting: Oncology

## 2011-06-13 ENCOUNTER — Encounter (HOSPITAL_BASED_OUTPATIENT_CLINIC_OR_DEPARTMENT_OTHER): Payer: PRIVATE HEALTH INSURANCE | Admitting: Oncology

## 2011-06-13 DIAGNOSIS — D638 Anemia in other chronic diseases classified elsewhere: Secondary | ICD-10-CM

## 2011-06-13 LAB — CBC WITH DIFFERENTIAL/PLATELET
Basophils Absolute: 0 10*3/uL (ref 0.0–0.1)
EOS%: 2.8 % (ref 0.0–7.0)
Eosinophils Absolute: 0.1 10*3/uL (ref 0.0–0.5)
HCT: 33.1 % — ABNORMAL LOW (ref 34.8–46.6)
HGB: 11.2 g/dL — ABNORMAL LOW (ref 11.6–15.9)
MCH: 33.2 pg (ref 25.1–34.0)
MONO#: 0.5 10*3/uL (ref 0.1–0.9)
NEUT#: 2.5 10*3/uL (ref 1.5–6.5)
NEUT%: 57.9 % (ref 38.4–76.8)
RDW: 13.9 % (ref 11.2–14.5)
lymph#: 1.1 10*3/uL (ref 0.9–3.3)

## 2011-06-23 ENCOUNTER — Emergency Department (HOSPITAL_COMMUNITY): Payer: PRIVATE HEALTH INSURANCE

## 2011-06-23 ENCOUNTER — Observation Stay (HOSPITAL_COMMUNITY)
Admission: EM | Admit: 2011-06-23 | Discharge: 2011-06-26 | Disposition: A | Discharge status: Home / Self Care | Payer: PRIVATE HEALTH INSURANCE | Source: Ambulatory Visit | Attending: Internal Medicine | Admitting: Internal Medicine

## 2011-06-23 DIAGNOSIS — M109 Gout, unspecified: Secondary | ICD-10-CM | POA: Insufficient documentation

## 2011-06-23 DIAGNOSIS — D509 Iron deficiency anemia, unspecified: Secondary | ICD-10-CM | POA: Insufficient documentation

## 2011-06-23 DIAGNOSIS — M810 Age-related osteoporosis without current pathological fracture: Secondary | ICD-10-CM | POA: Insufficient documentation

## 2011-06-23 DIAGNOSIS — R42 Dizziness and giddiness: Secondary | ICD-10-CM | POA: Insufficient documentation

## 2011-06-23 DIAGNOSIS — R55 Syncope and collapse: Principal | ICD-10-CM | POA: Insufficient documentation

## 2011-06-23 DIAGNOSIS — I428 Other cardiomyopathies: Secondary | ICD-10-CM | POA: Insufficient documentation

## 2011-06-23 DIAGNOSIS — N179 Acute kidney failure, unspecified: Secondary | ICD-10-CM | POA: Insufficient documentation

## 2011-06-23 DIAGNOSIS — I10 Essential (primary) hypertension: Secondary | ICD-10-CM | POA: Insufficient documentation

## 2011-06-23 DIAGNOSIS — I519 Heart disease, unspecified: Secondary | ICD-10-CM | POA: Insufficient documentation

## 2011-06-23 DIAGNOSIS — I359 Nonrheumatic aortic valve disorder, unspecified: Secondary | ICD-10-CM | POA: Insufficient documentation

## 2011-06-23 DIAGNOSIS — I059 Rheumatic mitral valve disease, unspecified: Secondary | ICD-10-CM | POA: Insufficient documentation

## 2011-06-23 DIAGNOSIS — R61 Generalized hyperhidrosis: Secondary | ICD-10-CM | POA: Insufficient documentation

## 2011-06-23 LAB — DIFFERENTIAL
Basophils Absolute: 0 10*3/uL (ref 0.0–0.1)
Basophils Relative: 1 % (ref 0–1)
Eosinophils Absolute: 0.1 10*3/uL (ref 0.0–0.7)
Eosinophils Relative: 1 % (ref 0–5)
Monocytes Absolute: 0.3 10*3/uL (ref 0.1–1.0)
Monocytes Relative: 7 % (ref 3–12)

## 2011-06-23 LAB — URINALYSIS, ROUTINE W REFLEX MICROSCOPIC
Bilirubin Urine: NEGATIVE
Hgb urine dipstick: NEGATIVE
Specific Gravity, Urine: 1.009 (ref 1.005–1.030)
Urobilinogen, UA: 0.2 mg/dL (ref 0.0–1.0)
pH: 7 (ref 5.0–8.0)

## 2011-06-23 LAB — CARDIAC PANEL(CRET KIN+CKTOT+MB+TROPI)
CK, MB: 2.4 ng/mL (ref 0.3–4.0)
Troponin I: <0.3 ng/mL (ref <0.30)

## 2011-06-23 LAB — COMPREHENSIVE METABOLIC PANEL
ALT: 42 U/L — ABNORMAL HIGH (ref 0–35)
BUN: 21 mg/dL (ref 6–23)
CO2: 28 mEq/L (ref 19–32)
Calcium: 10.5 mg/dL (ref 8.4–10.5)
GFR calc Af Amer: 50 mL/min — ABNORMAL LOW (ref >60)
GFR calc non Af Amer: 41 mL/min — ABNORMAL LOW (ref >60)
Glucose, Bld: 104 mg/dL — ABNORMAL HIGH (ref 70–99)
Sodium: 134 mEq/L — ABNORMAL LOW (ref 135–145)

## 2011-06-23 LAB — POCT I-STAT TROPONIN I: Troponin i, poc: 0 ng/mL (ref 0.00–0.08)

## 2011-06-23 LAB — CBC
HCT: 31.8 % — ABNORMAL LOW (ref 36.0–46.0)
Hemoglobin: 11.3 g/dL — ABNORMAL LOW (ref 12.0–15.0)
MCH: 33.7 pg (ref 26.0–34.0)
MCV: 94.9 fL (ref 78.0–100.0)
RBC: 3.35 MIL/uL — ABNORMAL LOW (ref 3.87–5.11)

## 2011-06-23 LAB — CK TOTAL AND CKMB (NOT AT ARMC)
Relative Index: 1.2 (ref 0.0–2.5)
Total CK: 237 U/L — ABNORMAL HIGH (ref 7–177)

## 2011-06-24 LAB — CARDIAC PANEL(CRET KIN+CKTOT+MB+TROPI): Total CK: 191 U/L — ABNORMAL HIGH (ref 7–177)

## 2011-06-24 LAB — TSH: TSH: 2.641 u[IU]/mL (ref 0.350–4.500)

## 2011-06-25 ENCOUNTER — Observation Stay (HOSPITAL_COMMUNITY): Payer: PRIVATE HEALTH INSURANCE

## 2011-06-25 DIAGNOSIS — R55 Syncope and collapse: Secondary | ICD-10-CM

## 2011-06-25 LAB — BASIC METABOLIC PANEL
BUN: 10 mg/dL (ref 6–23)
CO2: 26 mEq/L (ref 19–32)
Calcium: 9 mg/dL (ref 8.4–10.5)
Chloride: 110 mEq/L (ref 96–112)
Creatinine, Ser: 1.12 mg/dL — ABNORMAL HIGH (ref 0.50–1.10)
GFR calc Af Amer: 57 mL/min — ABNORMAL LOW (ref >60)
GFR calc non Af Amer: 47 mL/min — ABNORMAL LOW (ref >60)
Glucose, Bld: 101 mg/dL — ABNORMAL HIGH (ref 70–99)
Potassium: 4.4 mEq/L (ref 3.5–5.1)
Sodium: 142 mEq/L (ref 135–145)

## 2011-06-26 LAB — BASIC METABOLIC PANEL
Calcium: 8.9 mg/dL (ref 8.4–10.5)
GFR calc non Af Amer: 53 mL/min — ABNORMAL LOW (ref >60)
Glucose, Bld: 101 mg/dL — ABNORMAL HIGH (ref 70–99)
Sodium: 141 mEq/L (ref 135–145)

## 2011-06-26 LAB — CBC
MCH: 33.4 pg (ref 26.0–34.0)
MCHC: 34.5 g/dL (ref 30.0–36.0)
Platelets: 178 10*3/uL (ref 150–400)

## 2011-06-26 LAB — DIFFERENTIAL
Basophils Relative: 1 % (ref 0–1)
Eosinophils Absolute: 0.2 10*3/uL (ref 0.0–0.7)
Monocytes Absolute: 0.6 10*3/uL (ref 0.1–1.0)
Monocytes Relative: 17 % — ABNORMAL HIGH (ref 3–12)

## 2011-07-11 ENCOUNTER — Encounter (HOSPITAL_BASED_OUTPATIENT_CLINIC_OR_DEPARTMENT_OTHER): Payer: PRIVATE HEALTH INSURANCE | Admitting: Oncology

## 2011-07-11 ENCOUNTER — Other Ambulatory Visit: Payer: Self-pay | Admitting: Oncology

## 2011-07-11 DIAGNOSIS — I10 Essential (primary) hypertension: Secondary | ICD-10-CM

## 2011-07-11 DIAGNOSIS — D638 Anemia in other chronic diseases classified elsewhere: Secondary | ICD-10-CM

## 2011-07-11 DIAGNOSIS — D509 Iron deficiency anemia, unspecified: Secondary | ICD-10-CM

## 2011-07-11 LAB — CBC WITH DIFFERENTIAL/PLATELET
Basophils Absolute: 0.1 10*3/uL (ref 0.0–0.1)
LYMPH%: 24.1 % (ref 14.0–49.7)
MCV: 97.9 fL (ref 79.5–101.0)
MONO#: 0.5 10*3/uL (ref 0.1–0.9)
NEUT#: 2.3 10*3/uL (ref 1.5–6.5)
NEUT%: 58.4 % (ref 38.4–76.8)
WBC: 3.9 10*3/uL (ref 3.9–10.3)
lymph#: 1 10*3/uL (ref 0.9–3.3)

## 2011-07-11 LAB — BASIC METABOLIC PANEL
BUN: 15 mg/dL (ref 6–23)
Chloride: 108 mEq/L (ref 96–112)
Creatinine, Ser: 1.33 mg/dL — ABNORMAL HIGH (ref 0.50–1.10)
Glucose, Bld: 90 mg/dL (ref 70–99)
Potassium: 4 mEq/L (ref 3.5–5.3)

## 2011-07-11 LAB — IRON AND TIBC: %SAT: 18 % — ABNORMAL LOW (ref 20–55)

## 2011-07-15 NOTE — H&P (Signed)
NAMEMarland Kitchen  Kelly, [name] NO.:  [id number]  MEDICAL RECORD NO.:  [id number]  LOCATION:  [facility]                         FACILITY:  [facility]  PHYSICIAN:  Kelly Ruiz, MD    DATE OF BIRTH:  05-26-1932  DATE OF ADMISSION:  06/23/2011 DATE OF DISCHARGE:                             HISTORY & PHYSICAL   CHIEF COMPLAINT:  Collapsed.  HISTORY OF PRESENT ILLNESS:  The patient is a 77 year old black female with a past medical history significant for syncope last year, hypertension maintaining on amlodipine, and gout who was in her bathroom this morning sitting on the toilet when she suddenly became cold, clammy, and diaphoretic as well as very lightheaded.  She attempted to get out and by the time she reached her bed she collapsed.  Her son came to her bedroom and found her for 15 seconds not responsive.  Eventually, she regained consciousness and felt extremely weak.  She waited a little while and asked her son to bring her to the hospital for further evaluation.  By the time she reached the emergency department, she was feeling back to her normal self with no headache, lightheadedness, dizziness, chest pain, shortness of breath, or palpitations.  PAST MEDICAL HISTORY: 1. Hypertension, essential. 2. Syncopal episode, for which she was admitted to our hospital in May     2011 and was felt that it was secondary to the use of diuretics     with dehydration and possible surreptitious alcohol use. 3. Osteoporosis. 4. Iron-deficiency anemia. 5. Colonoscopy normal 5 years ago. 6. Cardiolite stress test in September 2009 was negative as well as     cardiac enzymes and EKG and admission for chest pain.  CURRENT MEDICATIONS: 1. Calcium oral. 2. Hydrochlorothiazide 12.5 mg daily. 3. Iron oral. 4. Folic acid oral. 5. Allopurinol 300 mg a day. 6. Colcrys . 7. Amlodipine 5 mg a day.  ALLERGIES:  No known drug allergies.  FAMILY HISTORY:  The father died from colon cancer and  the mother from tuberculosis.  Grandparents died from old age.  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  She had 2 glasses of beer last night with her dinner.  She drinks occasionally, she says.  She quit tobacco many years ago.  Denies illicit.  REVIEW OF SYSTEMS:  CONSTITUTIONAL:  Denies fever, chills, night sweats, fatigue, malaise, or weight loss.  ENT:  Denies hearing loss, tinnitus, sore throat, or runny nose.  No epistaxis.  CARDIOVASCULAR:  Denies chest pain, shortness of breath, palpitations, orthopnea, nocturia, or PND.  RESPIRATORY:  Denies cough or wheezing.  Denies history of tuberculosis.  GASTROINTESTINAL:  Denies abdominal pain, nausea, vomiting, diarrhea, constipation, melena, or hematochezia.  GU:  Denies dysuria, frequency, or hematuria.  MUSCULOSKELETAL:  Denies arthralgias, myalgias, or joint swelling.  ENDOCRINE:  Denies heat or cold intolerance.  No weight loss.  No weight gain.  No history of diabetes. NEUROLOGICAL:  Denies headache, focal weakness, dysarthria, numbness, orataxia.  PHYSICAL EXAMINATION:  VITAL SIGNS:  The blood pressure is 120/51, pulse 76, respirations 20, temperature 98.4, O2 sat 98%. GENERAL APPEARANCE:  The patient is a healthy-looking African American woman who appears comfortable in no distress.  Pleasant, cooperative during interview and examination. HEENT:  PERRLA, EOMI.  Sclerae clear.  Nose, normal nostrils.  Normal mucosa.  Oral cavity, normal dentition and oral mucosa. NECK:  Supple.  No lymphadenopathy.  No carotid bruits.  No JVD. Thyroid normal size and nontender without nodules. HEART:  Regular S1 and S2.  No S3 gallops.  No murmurs or rubs. LUNGS:  Clear to auscultation. ABDOMEN:  Soft and nontender without organomegaly or masses palpable. EXTREMITIES:  Without clubbing, cyanosis, or edema. NEURO:  Awake and oriented x3.  Cranial nerves II-XII intact.  Motor strength 5/5 in the upper and lower  extremities.  Sensory intact. Finger-to-nose normal.  Speech clear.  Gait not assessed.  Babinski negative.  LABORATORY DATA:  Sodium 134, potassium 4.7, chloride 98, carbon dioxide 28, glucose 104, BUN 21, creatinine 1.26, calcium 10.5, total protein 7.9, albumin 3.6, AST 60, ALT 42, alkaline phosphatase 95, total bilirubin 0.3, GFR 50, total CK 237, CK-MB 2.9, troponin 0.0.  CBC; WBC 4.3, hemoglobin 11.3, hematocrit 31.8, MCV 94.9, RDW 12.9, platelet count 220.  Chest x-ray, NAD.  CT head negative.  EKG, first-degree AV block, normal sinus rhythm at 83 beats per minute, no ST or T-wave abnormalities.  ASSESSMENT AND PLAN: 50. A 77 year old female with a history of syncopal episode in 2011     comes with a repeat syncopal episode this morning with a negative     initial workup including noncontrast head CT, normal neuro exam,     normal EKG, and negative cardiac enzymes, no electrolyte     abnormalities.  Differential diagnosis is broad at this point.  We     will admit the patient for observation in telemetry unit.  Cycle     cardiac enzymes.  Obtain echocardiogram and carotid Dopplers.  The     patient had a Cardiolite stress test in 2009, which was negative.     The patient might benefit from an event recorder as an outpatient.     Consider MRI of the brain. 2. Hypertension.  Continue amlodipine 5 mg p.o. daily.  Continue     aspirin 81 mg a day. 3. Gout.  Continue allopurinol 300 mg a day.  The patient is assigned to team 7.          ______________________________ Kelly Ruiz, MD     GL/MEDQ  D:  06/23/2011  T:  06/23/2011  Job:  [id number]  cc:   [name], M.D.  Electronically Signed by Kelly Ruiz MD on 07/15/2011 04:08:04 PM

## 2011-07-19 LAB — COMPREHENSIVE METABOLIC PANEL
AST: 63 — ABNORMAL HIGH
BUN: 17
CO2: 25
Creatinine, Ser: 1.55 — ABNORMAL HIGH
GFR calc Af Amer: 40 — ABNORMAL LOW
GFR calc non Af Amer: 33 — ABNORMAL LOW
Potassium: 4.7
Sodium: 140
Total Bilirubin: 0.6

## 2011-07-19 LAB — CK TOTAL AND CKMB (NOT AT ARMC): CK, MB: 1.1

## 2011-07-19 LAB — CBC
HCT: 26.9 — ABNORMAL LOW
MCHC: 33.6
MCV: 103.7 — ABNORMAL HIGH
MCV: 99.2
Platelets: 193
Platelets: 235
RBC: 2.27 — ABNORMAL LOW
RDW: 13.1
RDW: 15
WBC: 6.4

## 2011-07-19 LAB — TYPE AND SCREEN
ABO/RH(D): B POS
Antibody Screen: NEGATIVE

## 2011-07-19 LAB — BASIC METABOLIC PANEL
BUN: 18
Calcium: 10
Creatinine, Ser: 1.44 — ABNORMAL HIGH
GFR calc non Af Amer: 36 — ABNORMAL LOW
Glucose, Bld: 122 — ABNORMAL HIGH
Potassium: 4.3

## 2011-07-19 LAB — URINE MICROSCOPIC-ADD ON

## 2011-07-19 LAB — B-NATRIURETIC PEPTIDE (CONVERTED LAB): Pro B Natriuretic peptide (BNP): <30

## 2011-07-19 LAB — URINALYSIS, ROUTINE W REFLEX MICROSCOPIC
Glucose, UA: NEGATIVE
Nitrite: NEGATIVE
Specific Gravity, Urine: 1.017
pH: 6

## 2011-07-19 LAB — CARDIAC PANEL(CRET KIN+CKTOT+MB+TROPI)
CK, MB: 1.2
Relative Index: 1.1
Relative Index: INVALID
Total CK: 114
Troponin I: 0.01

## 2011-07-19 LAB — TROPONIN I: Troponin I: 0.01

## 2011-07-19 LAB — ABO/RH: ABO/RH(D): B POS

## 2011-07-19 LAB — DIFFERENTIAL
Basophils Absolute: 0
Eosinophils Relative: 1
Lymphocytes Relative: 12
Neutrophils Relative %: 78 — ABNORMAL HIGH

## 2011-07-19 LAB — LIPID PANEL
Cholesterol: 96
HDL: 25 — ABNORMAL LOW

## 2011-07-19 LAB — IRON AND TIBC
Saturation Ratios: 11 — ABNORMAL LOW
TIBC: 400

## 2011-07-19 LAB — OCCULT BLOOD X 1 CARD TO LAB, STOOL: Fecal Occult Bld: POSITIVE

## 2011-07-19 LAB — TSH: TSH: 2.839

## 2011-07-24 LAB — COMPREHENSIVE METABOLIC PANEL
ALT: 43 — ABNORMAL HIGH
AST: 57 — ABNORMAL HIGH
BUN: 17
CO2: 23
CO2: 25
Calcium: 9.8
Calcium: 9.9
GFR calc Af Amer: 55 — ABNORMAL LOW
GFR calc non Af Amer: 34 — ABNORMAL LOW
GFR calc non Af Amer: 46 — ABNORMAL LOW
Glucose, Bld: 87
Sodium: 141
Total Protein: 6.3
Total Protein: 7.5

## 2011-07-24 LAB — POCT I-STAT, CHEM 8
BUN: 19
Chloride: 105
Potassium: 4.5
Sodium: 135

## 2011-07-24 LAB — CBC
HCT: 34.7 — ABNORMAL LOW
Hemoglobin: 10.5 — ABNORMAL LOW
MCHC: 33.3
Platelets: 243
RBC: 2.98 — ABNORMAL LOW
RBC: 3.15 — ABNORMAL LOW
RDW: 13.6
WBC: 4.7
WBC: 5

## 2011-07-24 LAB — CK TOTAL AND CKMB (NOT AT ARMC)
CK, MB: 1.1
CK, MB: 1.6
Relative Index: 1
Total CK: 131
Total CK: 148

## 2011-07-24 LAB — LIPID PANEL
HDL: 27 — ABNORMAL LOW
Total CHOL/HDL Ratio: 3.2
VLDL: 21

## 2011-07-24 LAB — D-DIMER, QUANTITATIVE: D-Dimer, Quant: 0.39

## 2011-07-24 LAB — BASIC METABOLIC PANEL
Calcium: 9.3
GFR calc Af Amer: 53 — ABNORMAL LOW
GFR calc non Af Amer: 44 — ABNORMAL LOW
Glucose, Bld: 101 — ABNORMAL HIGH
Sodium: 137

## 2011-07-24 LAB — B-NATRIURETIC PEPTIDE (CONVERTED LAB): Pro B Natriuretic peptide (BNP): 432 — ABNORMAL HIGH

## 2011-07-24 LAB — POCT CARDIAC MARKERS
CKMB, poc: <1 — ABNORMAL LOW
Myoglobin, poc: 117
Troponin i, poc: <0.05

## 2011-07-24 LAB — TSH: TSH: 2.203

## 2011-07-24 LAB — TROPONIN I: Troponin I: 0.01

## 2011-08-05 NOTE — Discharge Summary (Signed)
NAMEMarland [name]  AMBERLIE, GAILLARD NO.:  [id number]  MEDICAL RECORD NO.:  [id number]  LOCATION:                                 FACILITY:  PHYSICIAN:  Clydia Llano, MD       DATE OF BIRTH:  05-27-1932  DATE OF ADMISSION:  06/23/2011 DATE OF DISCHARGE:  06/26/2011                              DISCHARGE SUMMARY   PRIMARY CARE PHYSICIAN:  Renaye Rakers, MD.  REASON FOR ADMISSION:  Syncope.  DISCHARGE DIAGNOSES: 1. Syncopal episode likely to be vasovagal.  Previous history of a     syncopal episode in May 2012. 2. Hypertension. 3. Osteoporosis. 4. Iron deficiency. 5. Normal Cardiolite stress test in September 2009. 6. Gouty arthritis. 7. Acute kidney injury, resolved.  DISCHARGE MEDICATIONS: 1. Allopurinol 100 mg p.o. daily. 2. Calcium carbonate/vitamin D OTC one tablet p.o. daily. 3. Colchicine 0.6 mg daily as needed for gout pain. 4. Exforge HCT 5/160/12.5. 5. Folic acid 1 mg p.o. b.i.d. 6. Hydrochlorothiazide 12.5 three times a week on Monday, Wednesday,     and Friday.  BRIEF HISTORY AND EXAMINATION:  [name] is a 77 year old African American female with a past medical history significant for syncope last year, hypertension, and gouty arthritis.  The patient came in after she collapsed.  The patient went to her bathroom on the day of admission and she was sitting on the toilet when she suddenly became cold and clammy and got diaphoretic as well as lightheaded.  She attempted to get out, and by the time she tried to reach her bed, she collapsed.  Her son came to her bedroom and found her on the ground.  She was unresponsive for a very brief time.  They said it may be about 15 seconds.  Eventually, she regained her consciousness, felt extremely weak, awaited a little while, and they brought her to the hospital for further evaluation.  The patient denies any prodrome type of symptoms.  RADIOLOGY: 1. MRI of the brain showed no acute or adverse reversible  changes.  No     cause of syncope identified.  Mild age-related atrophy and chronic     small vessel changes in the white matter. 2. Chest x-ray two views showed no evidence of acute cardiopulmonary     disease. 3. A 2-D echocardiogram showed left ventricular ejection fraction of     55%-60% with grade I diastolic dysfunction. 4. Carotid Doppler showed no significant extracranial carotid artery     stenosis.  There is antegrade vertebral flow.  BRIEF HOSPITAL COURSE:  The patient admitted to the hospital for further evaluation. 1. Syncope.  The patient admitted.  The patient denies any     palpitation, chest pain, shortness of breath, or prodrome symptoms     to suggest acute coronary syndrome or seizure.  Three sets of     cardiac enzymes were negative.  A 12-lead EKG and telemetry for 48     hours were negative for arrhythmias.  Because of the patient     symptoms correlates with going to the bathroom, this syncopal     episode is likely to be vasovagal syncope.  The patient reassured     and was felt  to be okay to be discharged home after the MRI and the     carotid Doppler were negative. 2. Acute kidney injury.  The patient upon the time of admission,     creatinine is slightly elevated at 1.26.  The patient taking     valsartan and hydrochlorothiazide.  These were held.  At the time     of admission, the patient also started on IV fluids and the     creatinine went better.  On the day of discharge it is 1.0.  Please     note that orthostatic blood pressure was checked and there is no     evidence of orthostatic hypotension. 3. Hypertension:  Controlled during this hospitalization. 4. Gouty arthritis.  No gout.  Please consider to discontinue the     hydrochlorothiazide, might be increase the amlodipine to control     the blood pressure as hydrochlorothiazide is known to increase the     exacerbations of the gout.  DISCHARGE INSTRUCTIONS: 1. Disposition:  Home. 2. Activity:   As tolerated. 3. Diet:  Heart-healthy diet.     Clydia Llano, MD     ME/MEDQ  D:  06/26/2011  T:  06/26/2011  Job:  [id number]  cc:   Renaye Rakers, M.D.  Electronically Signed by Clydia Llano  on 08/05/2011 01:08:36 PM

## 2011-08-08 ENCOUNTER — Encounter (HOSPITAL_BASED_OUTPATIENT_CLINIC_OR_DEPARTMENT_OTHER): Payer: PRIVATE HEALTH INSURANCE | Admitting: Oncology

## 2011-08-08 ENCOUNTER — Other Ambulatory Visit: Payer: Self-pay | Admitting: Oncology

## 2011-08-08 DIAGNOSIS — D638 Anemia in other chronic diseases classified elsewhere: Secondary | ICD-10-CM

## 2011-08-08 LAB — CBC WITH DIFFERENTIAL/PLATELET
Basophils Absolute: 0 10*3/uL (ref 0.0–0.1)
Eosinophils Absolute: 0.2 10*3/uL (ref 0.0–0.5)
HGB: 10.2 g/dL — ABNORMAL LOW (ref 11.6–15.9)
LYMPH%: 29 % (ref 14.0–49.7)
MCV: 99 fL (ref 79.5–101.0)
MONO%: 10.8 % (ref 0.0–14.0)
NEUT#: 2.2 10*3/uL (ref 1.5–6.5)
NEUT%: 55.6 % (ref 38.4–76.8)
Platelets: 219 10*3/uL (ref 145–400)

## 2011-08-31 ENCOUNTER — Encounter: Payer: Self-pay | Admitting: Oncology

## 2011-08-31 ENCOUNTER — Other Ambulatory Visit: Payer: Self-pay | Admitting: Oncology

## 2011-08-31 DIAGNOSIS — D638 Anemia in other chronic diseases classified elsewhere: Secondary | ICD-10-CM

## 2011-09-05 ENCOUNTER — Other Ambulatory Visit: Payer: Self-pay | Admitting: Oncology

## 2011-09-05 ENCOUNTER — Other Ambulatory Visit (HOSPITAL_BASED_OUTPATIENT_CLINIC_OR_DEPARTMENT_OTHER): Payer: PRIVATE HEALTH INSURANCE | Admitting: Lab

## 2011-09-05 ENCOUNTER — Ambulatory Visit: Payer: PRIVATE HEALTH INSURANCE

## 2011-09-05 DIAGNOSIS — D509 Iron deficiency anemia, unspecified: Secondary | ICD-10-CM

## 2011-09-05 DIAGNOSIS — D638 Anemia in other chronic diseases classified elsewhere: Secondary | ICD-10-CM

## 2011-09-05 LAB — CBC WITH DIFFERENTIAL/PLATELET
BASO%: 1 % (ref 0.0–2.0)
EOS%: 2.9 % (ref 0.0–7.0)
HCT: 37.2 % (ref 34.8–46.6)
LYMPH%: 23.5 % (ref 14.0–49.7)
MCH: 33.8 pg (ref 25.1–34.0)
MCHC: 33.3 g/dL (ref 31.5–36.0)
MONO#: 0.6 10*3/uL (ref 0.1–0.9)
NEUT%: 56.4 % (ref 38.4–76.8)
Platelets: 193 10*3/uL (ref 145–400)

## 2011-09-05 NOTE — Progress Notes (Signed)
Aranesp held due to Hgb 12.4.  Notified patient of lab results and next appointment

## 2011-10-02 ENCOUNTER — Encounter: Payer: Self-pay | Admitting: *Deleted

## 2011-10-03 ENCOUNTER — Other Ambulatory Visit: Payer: Self-pay | Admitting: Oncology

## 2011-10-03 ENCOUNTER — Ambulatory Visit (HOSPITAL_BASED_OUTPATIENT_CLINIC_OR_DEPARTMENT_OTHER): Payer: PRIVATE HEALTH INSURANCE | Admitting: Oncology

## 2011-10-03 ENCOUNTER — Telehealth: Payer: Self-pay | Admitting: Oncology

## 2011-10-03 ENCOUNTER — Other Ambulatory Visit (HOSPITAL_BASED_OUTPATIENT_CLINIC_OR_DEPARTMENT_OTHER): Payer: PRIVATE HEALTH INSURANCE | Admitting: Lab

## 2011-10-03 ENCOUNTER — Encounter: Payer: Self-pay | Admitting: Oncology

## 2011-10-03 VITALS — BP 111/58 | HR 73 | Temp 98.3°F | Ht 63.0 in | Wt 134.0 lb

## 2011-10-03 DIAGNOSIS — D638 Anemia in other chronic diseases classified elsewhere: Secondary | ICD-10-CM

## 2011-10-03 DIAGNOSIS — D649 Anemia, unspecified: Secondary | ICD-10-CM

## 2011-10-03 DIAGNOSIS — D509 Iron deficiency anemia, unspecified: Secondary | ICD-10-CM

## 2011-10-03 LAB — CBC WITH DIFFERENTIAL/PLATELET
BASO%: 0.4 % (ref 0.0–2.0)
EOS%: 2.3 % (ref 0.0–7.0)
HCT: 34.2 % — ABNORMAL LOW (ref 34.8–46.6)
MCH: 34.1 pg — ABNORMAL HIGH (ref 25.1–34.0)
MCHC: 34.2 g/dL (ref 31.5–36.0)
MCV: 99.7 fL (ref 79.5–101.0)
MONO%: 12.2 % (ref 0.0–14.0)
NEUT%: 60.3 % (ref 38.4–76.8)
lymph#: 1.1 10*3/uL (ref 0.9–3.3)

## 2011-10-03 LAB — IRON AND TIBC: UIBC: 298 ug/dL (ref 125–400)

## 2011-10-03 NOTE — Progress Notes (Signed)
OFFICE PROGRESS NOTE  CC Dr. Renaye Rakers  DIAGNOSIS: 77 year old female with anemia of chronic disease and iron deficiency.  PRIOR THERAPY: Patient has been receiving Aranesp injections and iron. She has received Aranesp injections to maintain a hemoglobin at or above 11 g/dL.  CURRENT THERAPY: Patient is here for her Aranesp injection 300 mcg every 4 weeks.  INTERVAL HISTORY: [name] 77 y.o. female returns for followup visit today. Overall she is doing well. She does not have any fatigue. She tells me she most likely does not need Aranesp injection she usually is a good judgment of this depending on her symptomatology. She is denying any fevers chills night sweats headaches shortness of breath chest pains palpitations no myalgias or arthralgias no bleeding problems and remainder of the 10 point review of systems is negative.  MEDICAL HISTORY: Past Medical History  Diagnosis Date  . Anemia of chronic disease 08/31/2011  . Anxiety   . Hypertension     ALLERGIES:   has no known allergies.  MEDICATIONS:  Current Outpatient Prescriptions  Medication Sig Dispense Refill  . allopurinol (ZYLOPRIM) 100 MG tablet Take 100 mg by mouth daily.        [name] amLODipine-valsartan (EXFORGE) 5-160 MG per tablet Take 1 tablet by mouth daily.        . calcium-vitamin D (OSCAL WITH D) 500-200 MG-UNIT per tablet Take 1 tablet by mouth daily.        . colchicine (COLCRYS) 0.6 MG tablet Take 0.6 mg by mouth daily.        [name] FeFum-FePoly-FA-B Cmp-C-Biot (INTEGRA PLUS PO) Take by mouth.        . folic acid (FOLVITE) 1 MG tablet Take 1 mg by mouth daily.          SURGICAL HISTORY:  Past Surgical History  Procedure Date  . Abdominal hysterectomy   . Appendectomy     REVIEW OF SYSTEMS:  Pertinent items are noted in HPI.   PHYSICAL EXAMINATION: General appearance: alert, cooperative and appears stated age Head: Normocephalic, without obvious abnormality, atraumatic Neck: no adenopathy, no  carotid bruit, no JVD, supple, symmetrical, trachea midline and thyroid not enlarged, symmetric, no tenderness/mass/nodules Lymph nodes: Cervical, supraclavicular, and axillary nodes normal. Resp: clear to auscultation bilaterally and normal percussion bilaterally Back: symmetric, no curvature. ROM normal. No CVA tenderness. Cardio: regular rate and rhythm, S1, S2 normal, no murmur, click, rub or gallop and normal apical impulse GI: soft, non-tender; bowel sounds normal; no masses,  no organomegaly Extremities: extremities normal, atraumatic, no cyanosis or edema Neurologic: Alert and oriented X 3, normal strength and tone. Normal symmetric reflexes. Normal coordination and gait  ECOG PERFORMANCE STATUS: 0 - Asymptomatic  Blood pressure 111/58, pulse 73, temperature 98.3 F (36.8 C), temperature source Oral, height 5\' 3"  (1.6 m), weight 134 lb (60.782 kg).  LABORATORY DATA: Lab Results  Component Value Date   WBC 4.3 10/03/2011   HGB 11.7 10/03/2011   HCT 34.2* 10/03/2011   MCV 99.7 10/03/2011   PLT 192 10/03/2011      Chemistry      Component Value Date/Time   NA 141 07/11/2011 0917   NA 141 07/11/2011 0917   NA 138 04/12/2010 0954   K 4.0 07/11/2011 0917   K 4.0 07/11/2011 0917   K 4.8* 04/12/2010 0954   CL 108 07/11/2011 0917   CL 108 07/11/2011 0917   CL 101 04/12/2010 0954   CO2 27 07/11/2011 0917   CO2 27 07/11/2011 0917  CO2 26 04/12/2010 0954   BUN 15 07/11/2011 0917   BUN 15 07/11/2011 0917   BUN 14 04/12/2010 0954   CREATININE 1.33* 07/11/2011 0917   CREATININE 1.33* 07/11/2011 0917   CREATININE 1.2 04/12/2010 0954      Component Value Date/Time   CALCIUM 9.9 07/11/2011 0917   CALCIUM 9.9 07/11/2011 0917   CALCIUM 9.8 04/12/2010 0954   ALKPHOS 95 06/23/2011 1126   ALKPHOS 106* 04/12/2010 0954   AST 60* 06/23/2011 1126   AST 80* 04/12/2010 0954   ALT 42* 06/23/2011 1126   BILITOT 0.3 06/23/2011 1126   BILITOT 0.50 04/12/2010 0954       RADIOGRAPHIC STUDIES:  No results  found.  ASSESSMENT: 77 year old female with anemia of chronic disease. As well as iron deficiency. Patient takes oral iron she has received parenteral iron in the past. She also is receiving Aranesp injections to maintain a hemoglobin at or above 11 g/dL. Today her hemoglobin is 11.7 and she also is quite asymptomatic. She will not receive injections today.   PLAN: No injection today she will return in 4 weeks' time for followup. I have updated her supportive therapy plan for her Aranesp injections.  She will see me back in my office in 3 months time at which time we will do a CBC be met and iron studies.   All questions were answered. The patient knows to call the clinic with any problems, questions or concerns. We can certainly see the patient much sooner if necessary.  I spent 20 minutes counseling the patient face to face. The total time spent in the appointment was 30 minutes.    Drue Second, MD Medical/Oncology [facility] [phone] (beeper) [phone] (Office)  10/03/2011, 11:40 AM

## 2011-10-03 NOTE — Telephone Encounter (Signed)
Gv pt appt for jan-march2013 °

## 2011-10-04 ENCOUNTER — Telehealth: Payer: Self-pay | Admitting: *Deleted

## 2011-10-04 NOTE — Telephone Encounter (Signed)
Notified pt- reviewed foods rich in Iron  &  encouraged to eat more

## 2011-10-04 NOTE — Telephone Encounter (Signed)
Message copied by Cooper Render on Fri Oct 04, 2011 10:25 AM ------      Message from: Victorino December      Created: Thu Oct 03, 2011  9:50 PM       Call patient:call pt take oral iron daily

## 2011-10-28 ENCOUNTER — Other Ambulatory Visit: Payer: Self-pay | Admitting: *Deleted

## 2011-10-28 MED ORDER — INTEGRA PLUS PO CAPS: 1.0000 | ORAL_CAPSULE | Freq: Every day | ORAL | Status: DC

## 2011-10-31 ENCOUNTER — Other Ambulatory Visit: Payer: Self-pay | Admitting: Oncology

## 2011-10-31 ENCOUNTER — Ambulatory Visit (HOSPITAL_BASED_OUTPATIENT_CLINIC_OR_DEPARTMENT_OTHER): Payer: PRIVATE HEALTH INSURANCE

## 2011-10-31 ENCOUNTER — Other Ambulatory Visit: Payer: PRIVATE HEALTH INSURANCE | Admitting: Lab

## 2011-10-31 DIAGNOSIS — K5090 Crohn's disease, unspecified, without complications: Secondary | ICD-10-CM

## 2011-10-31 DIAGNOSIS — D638 Anemia in other chronic diseases classified elsewhere: Secondary | ICD-10-CM

## 2011-10-31 DIAGNOSIS — D509 Iron deficiency anemia, unspecified: Secondary | ICD-10-CM

## 2011-10-31 LAB — CBC WITH DIFFERENTIAL/PLATELET
Basophils Absolute: 0 10*3/uL (ref 0.0–0.1)
Eosinophils Absolute: 0.1 10*3/uL (ref 0.0–0.5)
HCT: 29 % — ABNORMAL LOW (ref 34.8–46.6)
HGB: 9.6 g/dL — ABNORMAL LOW (ref 11.6–15.9)
LYMPH%: 20.4 % (ref 14.0–49.7)
MCV: 99 fL (ref 79.5–101.0)
MONO#: 0.7 10*3/uL (ref 0.1–0.9)
MONO%: 11 % (ref 0.0–14.0)
NEUT#: 4 10*3/uL (ref 1.5–6.5)
NEUT%: 66.5 % (ref 38.4–76.8)
Platelets: 254 10*3/uL (ref 145–400)
RBC: 2.93 10*6/uL — ABNORMAL LOW (ref 3.70–5.45)

## 2011-10-31 MED ORDER — DARBEPOETIN ALFA-POLYSORBATE 500 MCG/ML IJ SOLN
300.0000 ug | Freq: Once | INTRAMUSCULAR | Status: AC
  Administered 2011-10-31: 300 ug via SUBCUTANEOUS
  Filled 2011-10-31: qty 1

## 2011-11-18 ENCOUNTER — Telehealth: Payer: Self-pay | Admitting: *Deleted

## 2011-11-18 NOTE — Telephone Encounter (Signed)
patient called and moved appointment to 12-03-2011

## 2011-11-28 ENCOUNTER — Other Ambulatory Visit: Payer: PRIVATE HEALTH INSURANCE | Admitting: Lab

## 2011-11-28 ENCOUNTER — Ambulatory Visit: Payer: PRIVATE HEALTH INSURANCE

## 2011-12-03 ENCOUNTER — Other Ambulatory Visit (HOSPITAL_BASED_OUTPATIENT_CLINIC_OR_DEPARTMENT_OTHER): Payer: PRIVATE HEALTH INSURANCE | Admitting: Lab

## 2011-12-03 ENCOUNTER — Ambulatory Visit: Payer: PRIVATE HEALTH INSURANCE

## 2011-12-03 DIAGNOSIS — D638 Anemia in other chronic diseases classified elsewhere: Secondary | ICD-10-CM

## 2011-12-03 LAB — CBC WITH DIFFERENTIAL/PLATELET
BASO%: 0.5 % (ref 0.0–2.0)
Basophils Absolute: 0 10*3/uL (ref 0.0–0.1)
EOS%: 2.7 % (ref 0.0–7.0)
HCT: 35.8 % (ref 34.8–46.6)
HGB: 11.9 g/dL (ref 11.6–15.9)
LYMPH%: 34.1 % (ref 14.0–49.7)
MCH: 32.7 pg (ref 25.1–34.0)
MCHC: 33.2 g/dL (ref 31.5–36.0)
MCV: 98.4 fL (ref 79.5–101.0)
NEUT%: 51.5 % (ref 38.4–76.8)
Platelets: 200 10*3/uL (ref 145–400)

## 2011-12-03 NOTE — Progress Notes (Signed)
Aranesp held today for HGB of 11.9. Patient given lab results print out and verbalizes understanding of plan of care. Patient aware of next scheduled appointment.

## 2011-12-24 ENCOUNTER — Ambulatory Visit: Payer: PRIVATE HEALTH INSURANCE | Admitting: Oncology

## 2011-12-24 ENCOUNTER — Other Ambulatory Visit: Payer: PRIVATE HEALTH INSURANCE

## 2011-12-25 ENCOUNTER — Encounter: Payer: Self-pay | Admitting: *Deleted

## 2011-12-25 ENCOUNTER — Telehealth: Payer: Self-pay | Admitting: Oncology

## 2011-12-25 ENCOUNTER — Telehealth: Payer: Self-pay | Admitting: *Deleted

## 2011-12-25 NOTE — Telephone Encounter (Signed)
Reviewed with MD. Pt to come in tomorrow for labs. NR will review as MD out of office to determine if pt will need Aranesp

## 2011-12-25 NOTE — Telephone Encounter (Signed)
Pt called regarding appt for 12/24/11- pt was unaware of appt change. Her schedule shows appt for lab/md on 12/25/11. MD out of office on 12/25/11.   Onc Tx schedule for lab appt for 12/25/11 at 10:15- pt has already made arrangements for her ride to bring her in tomorrow and cannot cancel. Pt is also feeling " a little down. I can tell when I need a shot"

## 2011-12-25 NOTE — Progress Notes (Signed)
  Pt to have labs checked on 12/26/11 at 10:15 for possible Aranesp shot.  Pt last here 12/03/11 on Aranesp held for HGB of 11.9.

## 2011-12-25 NOTE — Telephone Encounter (Signed)
S/w the pt and she is aware of hr lab appt on 12/26/2011

## 2011-12-26 ENCOUNTER — Other Ambulatory Visit: Payer: PRIVATE HEALTH INSURANCE | Admitting: Lab

## 2011-12-26 ENCOUNTER — Encounter: Payer: Self-pay | Admitting: *Deleted

## 2011-12-26 ENCOUNTER — Telehealth: Payer: Self-pay | Admitting: *Deleted

## 2011-12-26 ENCOUNTER — Ambulatory Visit: Payer: PRIVATE HEALTH INSURANCE | Admitting: Oncology

## 2011-12-26 ENCOUNTER — Other Ambulatory Visit: Payer: Self-pay | Admitting: *Deleted

## 2011-12-26 ENCOUNTER — Ambulatory Visit (HOSPITAL_BASED_OUTPATIENT_CLINIC_OR_DEPARTMENT_OTHER): Payer: PRIVATE HEALTH INSURANCE

## 2011-12-26 ENCOUNTER — Other Ambulatory Visit (HOSPITAL_BASED_OUTPATIENT_CLINIC_OR_DEPARTMENT_OTHER): Payer: PRIVATE HEALTH INSURANCE | Admitting: Lab

## 2011-12-26 DIAGNOSIS — D509 Iron deficiency anemia, unspecified: Secondary | ICD-10-CM

## 2011-12-26 DIAGNOSIS — D649 Anemia, unspecified: Secondary | ICD-10-CM

## 2011-12-26 DIAGNOSIS — D638 Anemia in other chronic diseases classified elsewhere: Secondary | ICD-10-CM

## 2011-12-26 LAB — CBC WITH DIFFERENTIAL/PLATELET
Basophils Absolute: 0 10*3/uL (ref 0.0–0.1)
Eosinophils Absolute: 0.1 10*3/uL (ref 0.0–0.5)
HCT: 27.4 % — ABNORMAL LOW (ref 34.8–46.6)
LYMPH%: 19.3 % (ref 14.0–49.7)
MCV: 101.2 fL — ABNORMAL HIGH (ref 79.5–101.0)
MONO#: 0.5 10*3/uL (ref 0.1–0.9)
MONO%: 8.5 % (ref 0.0–14.0)
NEUT%: 70.3 % (ref 38.4–76.8)
Platelets: 259 10*3/uL (ref 145–400)
RBC: 2.7 10*6/uL — ABNORMAL LOW (ref 3.70–5.45)

## 2011-12-26 LAB — BASIC METABOLIC PANEL
BUN: 26 mg/dL — ABNORMAL HIGH (ref 6–23)
CO2: 27 mEq/L (ref 19–32)
Calcium: 9.5 mg/dL (ref 8.4–10.5)
Glucose, Bld: 98 mg/dL (ref 70–99)
Sodium: 140 mEq/L (ref 135–145)

## 2011-12-26 LAB — IRON AND TIBC
%SAT: 31 % (ref 20–55)
Iron: 117 ug/dL (ref 42–145)

## 2011-12-26 MED ORDER — DARBEPOETIN ALFA-POLYSORBATE 300 MCG/0.6ML IJ SOLN
300.0000 ug | Freq: Once | INTRAMUSCULAR | Status: AC
  Administered 2011-12-26: 300 ug via SUBCUTANEOUS
  Filled 2011-12-26: qty 1

## 2011-12-26 NOTE — Progress Notes (Unsigned)
Pt here for possible aranesp. Labs reviewed with NR, NP  HGB 9.4  Hct 27.4  Ok for Aranesp per Colman Cater, NP

## 2011-12-26 NOTE — Telephone Encounter (Signed)
patient confirmed over the phone on 12-26-2011 about appointment for 01-24-2012 starting at 9:00am

## 2011-12-26 NOTE — Telephone Encounter (Signed)
made patient apppointment for 01-2012 02-2012 03-2012 for labs and injections starting at 10:00am printed out calendar and gave to the patient

## 2012-01-02 ENCOUNTER — Telehealth: Payer: Self-pay | Admitting: *Deleted

## 2012-01-02 NOTE — Telephone Encounter (Signed)
Message copied by Cooper Render on Thu Jan 02, 2012  2:25 PM ------      Message from: Victorino December      Created: Thu Jan 02, 2012  2:19 PM       Call patient: take/continue oral iron on a daily basis

## 2012-01-02 NOTE — Telephone Encounter (Signed)
Per MD, notified pt to continue taking Oral Iron. Pt verbalized understanding.

## 2012-01-21 ENCOUNTER — Telehealth: Payer: Self-pay | Admitting: Oncology

## 2012-01-21 NOTE — Telephone Encounter (Signed)
S/w the pt and she is aware of her appts on 01/23/2012 with the np

## 2012-01-22 ENCOUNTER — Telehealth: Payer: Self-pay | Admitting: *Deleted

## 2012-01-22 ENCOUNTER — Other Ambulatory Visit: Payer: Self-pay | Admitting: *Deleted

## 2012-01-22 NOTE — Telephone Encounter (Signed)
patient confirmed over the phone the new date and time of her appointment on 01-28-2012 starting at10:30

## 2012-01-23 ENCOUNTER — Other Ambulatory Visit: Payer: PRIVATE HEALTH INSURANCE | Admitting: Lab

## 2012-01-23 ENCOUNTER — Ambulatory Visit: Payer: PRIVATE HEALTH INSURANCE | Admitting: Family

## 2012-01-23 ENCOUNTER — Ambulatory Visit: Payer: PRIVATE HEALTH INSURANCE

## 2012-01-24 ENCOUNTER — Other Ambulatory Visit: Payer: PRIVATE HEALTH INSURANCE | Admitting: Lab

## 2012-01-24 ENCOUNTER — Ambulatory Visit: Payer: PRIVATE HEALTH INSURANCE | Admitting: Oncology

## 2012-01-24 ENCOUNTER — Ambulatory Visit: Payer: PRIVATE HEALTH INSURANCE | Admitting: Physician Assistant

## 2012-01-27 ENCOUNTER — Encounter: Payer: Self-pay | Admitting: *Deleted

## 2012-01-27 NOTE — Progress Notes (Signed)
Error entry

## 2012-01-28 ENCOUNTER — Ambulatory Visit: Payer: PRIVATE HEALTH INSURANCE | Admitting: Family

## 2012-01-28 ENCOUNTER — Encounter: Payer: Self-pay | Admitting: Family

## 2012-01-28 ENCOUNTER — Telehealth: Payer: Self-pay | Admitting: *Deleted

## 2012-01-28 ENCOUNTER — Other Ambulatory Visit (HOSPITAL_BASED_OUTPATIENT_CLINIC_OR_DEPARTMENT_OTHER): Payer: PRIVATE HEALTH INSURANCE | Admitting: Lab

## 2012-01-28 ENCOUNTER — Other Ambulatory Visit: Payer: PRIVATE HEALTH INSURANCE | Admitting: Lab

## 2012-01-28 ENCOUNTER — Ambulatory Visit (HOSPITAL_BASED_OUTPATIENT_CLINIC_OR_DEPARTMENT_OTHER): Payer: PRIVATE HEALTH INSURANCE | Admitting: Family

## 2012-01-28 VITALS — BP 109/63 | HR 72 | Temp 98.0°F | Ht 63.0 in | Wt 130.6 lb

## 2012-01-28 DIAGNOSIS — D649 Anemia, unspecified: Secondary | ICD-10-CM

## 2012-01-28 LAB — COMPREHENSIVE METABOLIC PANEL
AST: 59 U/L — ABNORMAL HIGH (ref 0–37)
Alkaline Phosphatase: 96 U/L (ref 39–117)
BUN: 30 mg/dL — ABNORMAL HIGH (ref 6–23)
Creatinine, Ser: 1.32 mg/dL — ABNORMAL HIGH (ref 0.50–1.10)
Potassium: 4.4 mEq/L (ref 3.5–5.3)
Total Bilirubin: 0.3 mg/dL (ref 0.3–1.2)

## 2012-01-28 LAB — CBC WITH DIFFERENTIAL/PLATELET
Basophils Absolute: 0.1 10*3/uL (ref 0.0–0.1)
EOS%: 2.4 % (ref 0.0–7.0)
Eosinophils Absolute: 0.1 10*3/uL (ref 0.0–0.5)
HCT: 33.2 % — ABNORMAL LOW (ref 34.8–46.6)
HGB: 11.1 g/dL — ABNORMAL LOW (ref 11.6–15.9)
MCH: 33.9 pg (ref 25.1–34.0)
MCV: 101.9 fL — ABNORMAL HIGH (ref 79.5–101.0)
MONO%: 11.4 % (ref 0.0–14.0)
NEUT%: 60.7 % (ref 38.4–76.8)
Platelets: 193 10*3/uL (ref 145–400)

## 2012-01-28 LAB — IRON AND TIBC: %SAT: 22 % (ref 20–55)

## 2012-01-28 LAB — FERRITIN: Ferritin: 85 ng/mL (ref 10–291)

## 2012-01-28 NOTE — Progress Notes (Signed)
  OFFICE PROGRESS NOTE  CC Dr. Renaye Rakers  DIAGNOSIS: Anemia of chronic disease, renal insufficiency and iron deficiency.  PRIOR THERAPY: Aranesp injections every 4 weeks and iron as indicated.   CURRENT THERAPY: Aranesp injections every 4 weeks and iron as indicated.   INTERVAL HISTORY: Reports feeling very well, states "I know when I need a shot and I don't need one today". Last Aranesp injection 12/26/11. Denies fatigue or dyspnea. No headache or blurred vision. No cough. No abdominal pain or new bone pain. Bowel and bladder function are normal. Appetite is good, with adequate fluid intake. Takes oral iron. Remainder of the 10 point  review of systems is negative.  ALLERGIES:  None  PHYSICAL EXAMINATION:  General: Well developed, well nourished, in no acute distress.  EENT: No ocular or oral lesions. No stomatitis.  Respiratory: Lungs are clear to auscultation bilaterally with normal respiratory movement and no accessory muscle use. Cardiac: No murmur, rub or tachycardia. No upper or lower extremity edema.  GI: Abdomen is soft, no palpable hepatosplenomegaly. No fluid wave. No tenderness. Musculoskeletal: No kyphosis, no tenderness over the spine, ribs or hips. Lymph: No cervical, infraclavicular, axillary or inguinal adenopathy. Neuro: No focal neurological deficits. Psych: Alert and oriented X 3, appropriate mood and affect.   ECOG PERFORMANCE STATUS: 0 - Asymptomatic  Blood pressure 109/63, pulse 72, temperature 98 F (36.7 C), height 5\' 3"  (1.6 m), weight 130 lb 9.6 oz (59.24 kg).  LABORATORY DATA: Lab Results  Component Value Date   WBC 3.9 01/28/2012   HGB 11.1* 01/28/2012   HCT 33.2* 01/28/2012   MCV 101.9* 01/28/2012   PLT 193 01/28/2012      Chemistry      Component Value Date/Time   NA 138 01/28/2012 0958   NA 138 04/12/2010 0954   K 4.4 01/28/2012 0958   K 4.8* 04/12/2010 0954   CL 103 01/28/2012 0958   CL 101 04/12/2010 0954   CO2 27 01/28/2012 0958   CO2 26 04/12/2010  0954   BUN 30* 01/28/2012 0958   BUN 14 04/12/2010 0954   CREATININE 1.32* 01/28/2012 0958   CREATININE 1.2 04/12/2010 0954      Component Value Date/Time   CALCIUM 9.8 01/28/2012 0958   CALCIUM 9.8 04/12/2010 0954   ALKPHOS 96 01/28/2012 0958   ALKPHOS 106* 04/12/2010 0954   AST 59* 01/28/2012 0958   AST 80* 04/12/2010 0954   ALT 45* 01/28/2012 0958   BILITOT 0.3 01/28/2012 0958   BILITOT 0.50 04/12/2010 0954      ASSESSMENT:  1. Anemia, multiple causes. Stable, hgb. 11.1.  PLAN:   No injection today   Return every 4 weeks for lab and aranesp injection for hgb. Below 11.   Return to see Dr. Welton Flakes in 3 months.   All questions were answered. The patient knows to call the clinic with any problems, questions or concerns.

## 2012-01-28 NOTE — Telephone Encounter (Signed)
add on md appointment for 04-16-2012

## 2012-02-20 ENCOUNTER — Other Ambulatory Visit (HOSPITAL_BASED_OUTPATIENT_CLINIC_OR_DEPARTMENT_OTHER): Payer: PRIVATE HEALTH INSURANCE | Admitting: Lab

## 2012-02-20 ENCOUNTER — Ambulatory Visit: Payer: PRIVATE HEALTH INSURANCE

## 2012-02-20 DIAGNOSIS — D638 Anemia in other chronic diseases classified elsewhere: Secondary | ICD-10-CM

## 2012-02-20 LAB — CBC WITH DIFFERENTIAL/PLATELET
BASO%: 1 % (ref 0.0–2.0)
Basophils Absolute: 0 10*3/uL (ref 0.0–0.1)
EOS%: 4.9 % (ref 0.0–7.0)
HCT: 33 % — ABNORMAL LOW (ref 34.8–46.6)
HGB: 11.1 g/dL — ABNORMAL LOW (ref 11.6–15.9)
MCHC: 33.6 g/dL (ref 31.5–36.0)
MONO#: 0.4 10*3/uL (ref 0.1–0.9)
NEUT%: 59.8 % (ref 38.4–76.8)
RDW: 13.7 % (ref 11.2–14.5)
WBC: 4 10*3/uL (ref 3.9–10.3)
lymph#: 1 10*3/uL (ref 0.9–3.3)

## 2012-02-20 NOTE — Progress Notes (Signed)
Hgb  11.1 today.  Aranesp held due to pt not met parameters.  Explanation given to pt.  Pt voiced understanding , and aware of next return appt.

## 2012-03-19 ENCOUNTER — Ambulatory Visit (HOSPITAL_BASED_OUTPATIENT_CLINIC_OR_DEPARTMENT_OTHER): Payer: PRIVATE HEALTH INSURANCE

## 2012-03-19 ENCOUNTER — Other Ambulatory Visit (HOSPITAL_BASED_OUTPATIENT_CLINIC_OR_DEPARTMENT_OTHER): Payer: PRIVATE HEALTH INSURANCE | Admitting: Lab

## 2012-03-19 DIAGNOSIS — N289 Disorder of kidney and ureter, unspecified: Secondary | ICD-10-CM

## 2012-03-19 DIAGNOSIS — D638 Anemia in other chronic diseases classified elsewhere: Secondary | ICD-10-CM

## 2012-03-19 LAB — CBC WITH DIFFERENTIAL/PLATELET
Basophils Absolute: 0 10*3/uL (ref 0.0–0.1)
Eosinophils Absolute: 0.3 10*3/uL (ref 0.0–0.5)
HGB: 10.7 g/dL — ABNORMAL LOW (ref 11.6–15.9)
MCV: 103.3 fL — ABNORMAL HIGH (ref 79.5–101.0)
MONO#: 0.7 10*3/uL (ref 0.1–0.9)
MONO%: 14.5 % — ABNORMAL HIGH (ref 0.0–14.0)
NEUT#: 3 10*3/uL (ref 1.5–6.5)
RBC: 3.11 10*6/uL — ABNORMAL LOW (ref 3.70–5.45)
RDW: 13.9 % (ref 11.2–14.5)
WBC: 4.9 10*3/uL (ref 3.9–10.3)
lymph#: 0.9 10*3/uL (ref 0.9–3.3)
nRBC: 0 % (ref 0–0)

## 2012-03-19 MED ORDER — DARBEPOETIN ALFA-POLYSORBATE 300 MCG/0.6ML IJ SOLN
300.0000 ug | Freq: Once | INTRAMUSCULAR | Status: AC
  Administered 2012-03-19: 300 ug via SUBCUTANEOUS
  Filled 2012-03-19: qty 0.6

## 2012-04-16 ENCOUNTER — Other Ambulatory Visit: Payer: PRIVATE HEALTH INSURANCE | Admitting: Lab

## 2012-04-16 ENCOUNTER — Ambulatory Visit (HOSPITAL_BASED_OUTPATIENT_CLINIC_OR_DEPARTMENT_OTHER): Payer: PRIVATE HEALTH INSURANCE | Admitting: Oncology

## 2012-04-16 ENCOUNTER — Ambulatory Visit: Payer: PRIVATE HEALTH INSURANCE

## 2012-04-16 ENCOUNTER — Telehealth: Payer: Self-pay | Admitting: Oncology

## 2012-04-16 ENCOUNTER — Encounter: Payer: Self-pay | Admitting: Oncology

## 2012-04-16 VITALS — BP 105/60 | HR 63 | Temp 98.1°F | Ht 63.0 in | Wt 130.2 lb

## 2012-04-16 DIAGNOSIS — N289 Disorder of kidney and ureter, unspecified: Secondary | ICD-10-CM

## 2012-04-16 DIAGNOSIS — D509 Iron deficiency anemia, unspecified: Secondary | ICD-10-CM

## 2012-04-16 DIAGNOSIS — D638 Anemia in other chronic diseases classified elsewhere: Secondary | ICD-10-CM

## 2012-04-16 LAB — CBC WITH DIFFERENTIAL/PLATELET
EOS%: 6.3 % (ref 0.0–7.0)
Eosinophils Absolute: 0.2 10*3/uL (ref 0.0–0.5)
MCV: 103.9 fL — ABNORMAL HIGH (ref 79.5–101.0)
MONO%: 12.3 % (ref 0.0–14.0)
NEUT#: 2 10*3/uL (ref 1.5–6.5)
RBC: 3.61 10*6/uL — ABNORMAL LOW (ref 3.70–5.45)
RDW: 15.1 % — ABNORMAL HIGH (ref 11.2–14.5)

## 2012-04-16 NOTE — Progress Notes (Signed)
  OFFICE PROGRESS NOTE  CC Dr. Renaye Rakers  DIAGNOSIS: Anemia of chronic disease, renal insufficiency and iron deficiency.  PRIOR THERAPY: Aranesp injections every 4 weeks and iron as indicated.   CURRENT THERAPY: Aranesp injections every 4 weeks and iron as indicated.   INTERVAL HISTORY: Patient returns in followup.Her last injection was given to her on 03/19/2012. At that time her hemoglobin was 10.7. Today it is 12.5 she has responded very nicely to the Aranesp injection. I do think that giving her Aranesp injections every 4 weeks to keep her hemoglobin above 11 seems to be working quite well for her. She just celebrated her 89th birthday. She today he is without any complaints she denies any fevers chills night sweats headaches she has no shortness of breath chest pains or palpitations no myalgias or arthralgias remainder of the 10 point review of systems is negative.  ALLERGIES:  None  PHYSICAL EXAMINATION:  General: Well developed, well nourished, in no acute distress.  EENT: No ocular or oral lesions. No stomatitis.  Respiratory: Lungs are clear to auscultation bilaterally with normal respiratory movement and no accessory muscle use. Cardiac: No murmur, rub or tachycardia. No upper or lower extremity edema.  GI: Abdomen is soft, no palpable hepatosplenomegaly. No fluid wave. No tenderness. Musculoskeletal: No kyphosis, no tenderness over the spine, ribs or hips. Lymph: No cervical, infraclavicular, axillary or inguinal adenopathy. Neuro: No focal neurological deficits. Psych: Alert and oriented X 3, appropriate mood and affect.   ECOG PERFORMANCE STATUS: 0 - Asymptomatic  Blood pressure 105/60, pulse 63, temperature 98.1 F (36.7 C), temperature source Oral, height 5\' 3"  (1.6 m), weight 130 lb 3.2 oz (59.058 kg).  LABORATORY DATA: Lab Results  Component Value Date   WBC 3.7* 04/16/2012   HGB 12.5 04/16/2012   HCT 37.5 04/16/2012   MCV 103.9* 04/16/2012   PLT 210 04/16/2012       Chemistry      Component Value Date/Time   NA 138 01/28/2012 0958   NA 138 04/12/2010 0954   K 4.4 01/28/2012 0958   K 4.8* 04/12/2010 0954   CL 103 01/28/2012 0958   CL 101 04/12/2010 0954   CO2 27 01/28/2012 0958   CO2 26 04/12/2010 0954   BUN 30* 01/28/2012 0958   BUN 14 04/12/2010 0954   CREATININE 1.32* 01/28/2012 0958   CREATININE 1.2 04/12/2010 0954      Component Value Date/Time   CALCIUM 9.8 01/28/2012 0958   CALCIUM 9.8 04/12/2010 0954   ALKPHOS 96 01/28/2012 0958   ALKPHOS 106* 04/12/2010 0954   AST 59* 01/28/2012 0958   AST 80* 04/12/2010 0954   ALT 45* 01/28/2012 0958   BILITOT 0.3 01/28/2012 0958   BILITOT 0.50 04/12/2010 0954      ASSESSMENT:  1. Anemia, multiple causes And chronic disease. Stable, hgb. 12.5.  PLAN:   No injection today   Return every 4 weeks for lab and aranesp injection for hgb. Below 11.    All questions were answered. The patient knows to call the clinic with any problems, questions or concerns.

## 2012-04-16 NOTE — Patient Instructions (Addendum)
1. You do not need aranesp today.  2. We will continue aranesp every 4 weeks with a blood count done on the same day  3. I will see you back in 6 months

## 2012-04-16 NOTE — Telephone Encounter (Signed)
gve the pt her July-feb 2014 appt calendars

## 2012-05-14 ENCOUNTER — Other Ambulatory Visit (HOSPITAL_BASED_OUTPATIENT_CLINIC_OR_DEPARTMENT_OTHER): Payer: PRIVATE HEALTH INSURANCE | Admitting: Lab

## 2012-05-14 ENCOUNTER — Ambulatory Visit (HOSPITAL_BASED_OUTPATIENT_CLINIC_OR_DEPARTMENT_OTHER): Payer: PRIVATE HEALTH INSURANCE

## 2012-05-14 VITALS — BP 119/60 | HR 90 | Temp 97.4°F

## 2012-05-14 DIAGNOSIS — D638 Anemia in other chronic diseases classified elsewhere: Secondary | ICD-10-CM

## 2012-05-14 DIAGNOSIS — N289 Disorder of kidney and ureter, unspecified: Secondary | ICD-10-CM

## 2012-05-14 LAB — CBC WITH DIFFERENTIAL/PLATELET
BASO%: 1.2 % (ref 0.0–2.0)
Basophils Absolute: 0.1 10*3/uL (ref 0.0–0.1)
EOS%: 4.1 % (ref 0.0–7.0)
HCT: 29.4 % — ABNORMAL LOW (ref 34.8–46.6)
LYMPH%: 21 % (ref 14.0–49.7)
MCH: 34.4 pg — ABNORMAL HIGH (ref 25.1–34.0)
MCHC: 33.3 g/dL (ref 31.5–36.0)
MCV: 103.5 fL — ABNORMAL HIGH (ref 79.5–101.0)
MONO%: 12 % (ref 0.0–14.0)
NEUT#: 3.4 10*3/uL (ref 1.5–6.5)
NEUT%: 61.7 % (ref 38.4–76.8)
WBC: 5.5 10*3/uL (ref 3.9–10.3)
lymph#: 1.2 10*3/uL (ref 0.9–3.3)

## 2012-05-14 MED ORDER — DARBEPOETIN ALFA-POLYSORBATE 300 MCG/0.6ML IJ SOLN
300.0000 ug | Freq: Once | INTRAMUSCULAR | Status: AC
  Administered 2012-05-14: 300 ug via SUBCUTANEOUS
  Filled 2012-05-14: qty 0.6

## 2012-05-18 ENCOUNTER — Emergency Department (HOSPITAL_COMMUNITY): Payer: PRIVATE HEALTH INSURANCE

## 2012-05-18 ENCOUNTER — Encounter (HOSPITAL_COMMUNITY): Payer: Self-pay | Admitting: *Deleted

## 2012-05-18 ENCOUNTER — Emergency Department (HOSPITAL_COMMUNITY)
Admission: EM | Admit: 2012-05-18 | Discharge: 2012-05-18 | Disposition: A | Discharge status: Home / Self Care | Payer: PRIVATE HEALTH INSURANCE | Attending: Emergency Medicine | Admitting: Emergency Medicine

## 2012-05-18 DIAGNOSIS — I10 Essential (primary) hypertension: Secondary | ICD-10-CM | POA: Insufficient documentation

## 2012-05-18 DIAGNOSIS — Z9089 Acquired absence of other organs: Secondary | ICD-10-CM | POA: Insufficient documentation

## 2012-05-18 DIAGNOSIS — R5383 Other fatigue: Secondary | ICD-10-CM | POA: Insufficient documentation

## 2012-05-18 DIAGNOSIS — D649 Anemia, unspecified: Secondary | ICD-10-CM | POA: Insufficient documentation

## 2012-05-18 DIAGNOSIS — R0602 Shortness of breath: Secondary | ICD-10-CM | POA: Insufficient documentation

## 2012-05-18 DIAGNOSIS — R531 Weakness: Secondary | ICD-10-CM

## 2012-05-18 DIAGNOSIS — R5381 Other malaise: Secondary | ICD-10-CM | POA: Insufficient documentation

## 2012-05-18 LAB — COMPREHENSIVE METABOLIC PANEL
ALT: 53 U/L — ABNORMAL HIGH (ref 0–35)
Albumin: 3.5 g/dL (ref 3.5–5.2)
Alkaline Phosphatase: 100 U/L (ref 39–117)
BUN: 18 mg/dL (ref 6–23)
Chloride: 105 mEq/L (ref 96–112)
Potassium: 4 mEq/L (ref 3.5–5.1)
Sodium: 141 mEq/L (ref 135–145)
Total Bilirubin: 0.2 mg/dL — ABNORMAL LOW (ref 0.3–1.2)
Total Protein: 7.6 g/dL (ref 6.0–8.3)

## 2012-05-18 LAB — CBC WITH DIFFERENTIAL/PLATELET
Basophils Absolute: 0.1 10*3/uL (ref 0.0–0.1)
HCT: 29.9 % — ABNORMAL LOW (ref 36.0–46.0)
Hemoglobin: 10 g/dL — ABNORMAL LOW (ref 12.0–15.0)
Lymphocytes Relative: 25 % (ref 12–46)
Monocytes Absolute: 0.6 10*3/uL (ref 0.1–1.0)
Monocytes Relative: 11 % (ref 3–12)
Neutro Abs: 3.3 10*3/uL (ref 1.7–7.7)
Neutrophils Relative %: 61 % (ref 43–77)
RBC: 2.96 MIL/uL — ABNORMAL LOW (ref 3.87–5.11)
WBC: 5.4 10*3/uL (ref 4.0–10.5)

## 2012-05-18 LAB — URINE MICROSCOPIC-ADD ON

## 2012-05-18 LAB — URINALYSIS, ROUTINE W REFLEX MICROSCOPIC
Bilirubin Urine: NEGATIVE
Hgb urine dipstick: NEGATIVE
Nitrite: NEGATIVE
Specific Gravity, Urine: 1.017 (ref 1.005–1.030)
Urobilinogen, UA: 0.2 mg/dL (ref 0.0–1.0)
pH: 6.5 (ref 5.0–8.0)

## 2012-05-18 LAB — D-DIMER, QUANTITATIVE: D-Dimer, Quant: 0.3 ug/mL-FEU (ref 0.00–0.48)

## 2012-05-18 LAB — POCT I-STAT TROPONIN I: Troponin i, poc: 0 ng/mL (ref 0.00–0.08)

## 2012-05-18 MED ORDER — ACETAMINOPHEN 325 MG PO TABS
650.0000 mg | ORAL_TABLET | Freq: Once | ORAL | Status: AC
  Administered 2012-05-18: 650 mg via ORAL
  Filled 2012-05-18: qty 2

## 2012-05-18 NOTE — ED Notes (Signed)
2 IV attempts unsuccessful.  Will have another RN attempt.

## 2012-05-18 NOTE — ED Provider Notes (Signed)
History     CSN: [id number]  Arrival date & time 05/18/12  1478   First MD Initiated Contact with Patient 05/18/12 1057      Chief Complaint  Patient presents with  . Weakness    (Consider location/radiation/quality/duration/timing/severity/associated sxs/prior treatment) Patient is a 78 y.o. female presenting with weakness. The history is provided by the patient. No language interpreter was used.  Weakness Primary symptoms do not include headaches, syncope, loss of consciousness, dizziness, focal weakness, loss of sensation, memory loss, fever, nausea or vomiting.  Additional symptoms include weakness. Additional symptoms do not include pain, hearing loss or anxiety.   78 year old female coming in with complaint of generalized weakness. Patient is being treated once a month with Aranesp for anemia per oncology. Her PCP is Dr. [name]. States that her hemoglobin last week was 9. States that before that was 13. Denies any bleeding. States that for the past 2 days she has gotten increasingly shortness of breath with ambulating. States that she just feels sluggish and thinks that she does have palpitations from time to time. Denies chest pain, no cardiac history, no long trips, no calf pain. Past medical history of anemia, anxiety, hypertension, hysterectomy and appendectomy. Does not smoke. Drinks alcohol on a weekly basis.  Past Medical History  Diagnosis Date  . Anemia of chronic disease 08/31/2011  . Anxiety   . Hypertension   . Anemia of chronic disease 04/16/2012    Past Surgical History  Procedure Date  . Abdominal hysterectomy   . Appendectomy     Family History  Problem Relation Age of Onset  . Cancer Father     History  Substance Use Topics  . Smoking status: Former Games developer  . Smokeless tobacco: Not on file  . Alcohol Use: 0.6 oz/week    1 Glasses of wine per week    OB History    Grav Para Term Preterm Abortions TAB SAB Ect Mult Living                  Review  of Systems  Constitutional: Negative for fever.  HENT: Negative.  Negative for hearing loss, congestion and rhinorrhea.   Eyes: Negative.   Respiratory: Positive for shortness of breath.   Cardiovascular: Positive for palpitations. Negative for chest pain, leg swelling and syncope.  Gastrointestinal: Negative.  Negative for nausea, vomiting and blood in stool.  Musculoskeletal: Negative for back pain and gait problem.  Neurological: Positive for weakness. Negative for dizziness, focal weakness, loss of consciousness and headaches.  Psychiatric/Behavioral: Negative.  Negative for memory loss.  All other systems reviewed and are negative.    Allergies  Review of patient's allergies indicates no known allergies.  Home Medications   Current Outpatient Rx  Name Route Sig Dispense Refill  . ALLOPURINOL 100 MG PO TABS Oral Take 100 mg by mouth daily.     [name] AMLODIPINE BESYLATE-VALSARTAN 5-160 MG PO TABS Oral Take 1 tablet by mouth daily.     [name] CALCIUM CARBONATE 600 MG PO TABS Oral Take 600 mg by mouth daily.    Arnette Schaumann PLUS PO CAPS Oral Take 1 each by mouth daily. 30 capsule 6  . FOLIC ACID 400 MCG PO TABS Oral Take 400-800 mcg by mouth daily.      BP 116/44  Pulse 79  Temp 97.8 F (36.6 C) (Oral)  Resp 18  SpO2 95%  Physical Exam  Nursing note and vitals reviewed. Constitutional: She is oriented to person, place, and time. She  appears well-developed and well-nourished.  HENT:  Head: Normocephalic and atraumatic.  Eyes: Conjunctivae and EOM are normal. Pupils are equal, round, and reactive to light.  Neck: Normal range of motion. Neck supple.  Cardiovascular: Normal rate, normal heart sounds and intact distal pulses.   Pulmonary/Chest: Effort normal and breath sounds normal. No respiratory distress. She exhibits no tenderness.  Abdominal: Soft.  Musculoskeletal: Normal range of motion. She exhibits no edema and no tenderness.  Neurological: She is alert and oriented to person,  place, and time. She has normal reflexes. No cranial nerve deficit. Coordination normal.  Skin: Skin is warm and dry.  Psychiatric: She has a normal mood and affect.    ED Course  Procedures (including critical care time)  Labs Reviewed  CBC WITH DIFFERENTIAL - Abnormal; Notable for the following:    RBC 2.96 (*)     Hemoglobin 10.0 (*)     HCT 29.9 (*)     MCV 101.0 (*)     All other components within normal limits  URINALYSIS, ROUTINE W REFLEX MICROSCOPIC - Abnormal; Notable for the following:    APPearance HAZY (*)     Leukocytes, UA MODERATE (*)     All other components within normal limits  URINE MICROSCOPIC-ADD ON - Abnormal; Notable for the following:    Squamous Epithelial / LPF FEW (*)     All other components within normal limits  POCT I-STAT TROPONIN I  COMPREHENSIVE METABOLIC PANEL  D-DIMER, QUANTITATIVE   No results found.   No diagnosis found.    MDM   Date: 05/18/2012  Rate: 80  Rhythm: normal sinus rhythm  QRS Axis: normal  Intervals: normal  ST/T Wave abnormalities: normal  Conduction Disutrbances:none  Narrative Interpretation:   Old EKG Reviewed: unchanged  78 year-old female complaining of weakness and shortness of breath. Chest x-ray unremarkable. D-dimer negative. Hemoglobin 10.0. Which is up from last week with a past medical history of anemia receiving aranesp per oncology. Patient will continue to followup and have her hemoglobin checked. No explanation today for increased weakness and shortness of breath. Patient does not appear short of breath or toxic. She will followup with her PCP for possible thyroid studies. Return to the ER for extreme shortness of breath or chest pain.          Remi Haggard, NP 05/19/12 [phone]

## 2012-05-18 NOTE — ED Provider Notes (Signed)
History     CSN: [id number]  Arrival date & time 05/18/12  4098   First MD Initiated Contact with Patient 05/18/12 1057      Chief Complaint  Patient presents with  . Weakness    (Consider location/radiation/quality/duration/timing/severity/associated sxs/prior treatment) HPI  Past Medical History  Diagnosis Date  . Anemia of chronic disease 08/31/2011  . Anxiety   . Hypertension   . Anemia of chronic disease 04/16/2012    Past Surgical History  Procedure Date  . Abdominal hysterectomy   . Appendectomy     Family History  Problem Relation Age of Onset  . Cancer Father     History  Substance Use Topics  . Smoking status: Former Games developer  . Smokeless tobacco: Not on file  . Alcohol Use: 0.6 oz/week    1 Glasses of wine per week    OB History    Grav Para Term Preterm Abortions TAB SAB Ect Mult Living                  Review of Systems  Allergies  Review of patient's allergies indicates no known allergies.  Home Medications   Current Outpatient Rx  Name Route Sig Dispense Refill  . ALLOPURINOL 100 MG PO TABS Oral Take 100 mg by mouth daily.     [name] AMLODIPINE BESYLATE-VALSARTAN 5-160 MG PO TABS Oral Take 1 tablet by mouth daily.     [name] CALCIUM CARBONATE 600 MG PO TABS Oral Take 600 mg by mouth daily.    Arnette Schaumann PLUS PO CAPS Oral Take 1 each by mouth daily. 30 capsule 6  . FOLIC ACID 400 MCG PO TABS Oral Take 400-800 mcg by mouth daily.      BP 116/44  Pulse 79  Temp 97.8 F (36.6 C) (Oral)  Resp 18  SpO2 95%  Physical Exam  ED Course  Procedures (including critical care time)  Labs Reviewed  CBC WITH DIFFERENTIAL - Abnormal; Notable for the following:    RBC 2.96 (*)     Hemoglobin 10.0 (*)     HCT 29.9 (*)     MCV 101.0 (*)     All other components within normal limits  URINALYSIS, ROUTINE W REFLEX MICROSCOPIC - Abnormal; Notable for the following:    APPearance HAZY (*)     Leukocytes, UA MODERATE (*)     All other components within  normal limits  COMPREHENSIVE METABOLIC PANEL - Abnormal; Notable for the following:    Glucose, Bld 103 (*)     Creatinine, Ser 1.32 (*)     AST 63 (*)     ALT 53 (*)     Total Bilirubin 0.2 (*)     GFR calc non Af Amer 38 (*)     GFR calc Af Amer 44 (*)     All other components within normal limits  URINE MICROSCOPIC-ADD ON - Abnormal; Notable for the following:    Squamous Epithelial / LPF FEW (*)     All other components within normal limits  POCT I-STAT TROPONIN I  D-DIMER, QUANTITATIVE   Dg Chest 2 View  05/18/2012  *RADIOLOGY REPORT*  Clinical Data: Shortness of breath and weakness  CHEST - 2 VIEW  Comparison: 06/23/2011  Findings: Heart size is normal.  No pleural effusion or edema.  No airspace consolidation identified.  Chronic interstitial coarsening is noted bilaterally.  The lung volumes appear increased, similar to previous exam.  IMPRESSION:  1.  No acute cardiopulmonary abnormalities.  Original Report Authenticated By: Rosealee Albee, M.D.     No diagnosis found.    MDM  Pt is being evaluated by NP Pattricia Boss C.  I have also obtained hx and performed exam.  Pt c/o weakness that she thinks may be related to chronic anemia.  She denies fever, NVD, CP, SOB, anorexia, depression, syncope, or unilat weakness.  Pt appears comfortable with no focal deficits.  Plan review screening labs as available, will likely refer back to her primary physician.        Tobin Chad, MD 05/18/12 1341

## 2012-05-18 NOTE — ED Notes (Addendum)
Pt reports hx of anemia, had injection on Thursday for anemia, but states she still doesn't feel any better. Reports generalized headache and generalized weakness. Ambulatory without difficulty. No sob.

## 2012-05-19 LAB — URINE CULTURE

## 2012-05-22 ENCOUNTER — Other Ambulatory Visit: Payer: Self-pay | Admitting: Oncology

## 2012-06-07 ENCOUNTER — Emergency Department (HOSPITAL_COMMUNITY): Payer: PRIVATE HEALTH INSURANCE

## 2012-06-07 ENCOUNTER — Emergency Department (HOSPITAL_COMMUNITY)
Admission: EM | Admit: 2012-06-07 | Discharge: 2012-06-07 | Disposition: A | Discharge status: Home / Self Care | Payer: PRIVATE HEALTH INSURANCE | Attending: Emergency Medicine | Admitting: Emergency Medicine

## 2012-06-07 ENCOUNTER — Encounter (HOSPITAL_COMMUNITY): Payer: Self-pay | Admitting: *Deleted

## 2012-06-07 DIAGNOSIS — R4020 Unspecified coma: Secondary | ICD-10-CM

## 2012-06-07 DIAGNOSIS — R5381 Other malaise: Secondary | ICD-10-CM | POA: Insufficient documentation

## 2012-06-07 DIAGNOSIS — R531 Weakness: Secondary | ICD-10-CM

## 2012-06-07 DIAGNOSIS — R55 Syncope and collapse: Secondary | ICD-10-CM | POA: Insufficient documentation

## 2012-06-07 DIAGNOSIS — R404 Transient alteration of awareness: Secondary | ICD-10-CM | POA: Insufficient documentation

## 2012-06-07 DIAGNOSIS — F411 Generalized anxiety disorder: Secondary | ICD-10-CM | POA: Insufficient documentation

## 2012-06-07 DIAGNOSIS — I10 Essential (primary) hypertension: Secondary | ICD-10-CM | POA: Insufficient documentation

## 2012-06-07 DIAGNOSIS — Z79899 Other long term (current) drug therapy: Secondary | ICD-10-CM | POA: Insufficient documentation

## 2012-06-07 LAB — CBC WITH DIFFERENTIAL/PLATELET
Basophils Absolute: 0 10*3/uL (ref 0.0–0.1)
Basophils Relative: 0 % (ref 0–1)
Eosinophils Absolute: 0.1 10*3/uL (ref 0.0–0.7)
Eosinophils Relative: 1 % (ref 0–5)
HCT: 32 % — ABNORMAL LOW (ref 36.0–46.0)
Hemoglobin: 10.5 g/dL — ABNORMAL LOW (ref 12.0–15.0)
Lymphocytes Relative: 13 % (ref 12–46)
Lymphs Abs: 0.7 10*3/uL (ref 0.7–4.0)
MCH: 33 pg (ref 26.0–34.0)
MCHC: 32.8 g/dL (ref 30.0–36.0)
MCV: 100.6 fL — ABNORMAL HIGH (ref 78.0–100.0)
Monocytes Absolute: 0.4 10*3/uL (ref 0.1–1.0)
Monocytes Relative: 7 % (ref 3–12)
Neutro Abs: 4.5 10*3/uL (ref 1.7–7.7)
Neutrophils Relative %: 79 % — ABNORMAL HIGH (ref 43–77)
Platelets: 214 10*3/uL (ref 150–400)
RBC: 3.18 MIL/uL — ABNORMAL LOW (ref 3.87–5.11)
RDW: 13.5 % (ref 11.5–15.5)
WBC: 5.7 10*3/uL (ref 4.0–10.5)

## 2012-06-07 LAB — URINALYSIS, ROUTINE W REFLEX MICROSCOPIC
Bilirubin Urine: NEGATIVE
Glucose, UA: NEGATIVE mg/dL
Hgb urine dipstick: NEGATIVE
Ketones, ur: NEGATIVE mg/dL
Leukocytes, UA: NEGATIVE
Nitrite: NEGATIVE
Protein, ur: NEGATIVE mg/dL
Specific Gravity, Urine: 1.012 (ref 1.005–1.030)
Urobilinogen, UA: 0.2 mg/dL (ref 0.0–1.0)
pH: 7 (ref 5.0–8.0)

## 2012-06-07 LAB — COMPREHENSIVE METABOLIC PANEL
ALT: 46 U/L — ABNORMAL HIGH (ref 0–35)
AST: 69 U/L — ABNORMAL HIGH (ref 0–37)
Albumin: 3.4 g/dL — ABNORMAL LOW (ref 3.5–5.2)
Alkaline Phosphatase: 100 U/L (ref 39–117)
BUN: 24 mg/dL — ABNORMAL HIGH (ref 6–23)
CO2: 27 mEq/L (ref 19–32)
Calcium: 10.5 mg/dL (ref 8.4–10.5)
Chloride: 103 mEq/L (ref 96–112)
Creatinine, Ser: 1.38 mg/dL — ABNORMAL HIGH (ref 0.50–1.10)
GFR calc Af Amer: 41 mL/min — ABNORMAL LOW (ref >90)
GFR calc non Af Amer: 36 mL/min — ABNORMAL LOW (ref >90)
Glucose, Bld: 103 mg/dL — ABNORMAL HIGH (ref 70–99)
Potassium: 5 mEq/L (ref 3.5–5.1)
Sodium: 138 mEq/L (ref 135–145)
Total Bilirubin: 0.2 mg/dL — ABNORMAL LOW (ref 0.3–1.2)
Total Protein: 7.7 g/dL (ref 6.0–8.3)

## 2012-06-07 MED ORDER — SODIUM CHLORIDE 0.9 % IV BOLUS (SEPSIS)
500.0000 mL | Freq: Once | INTRAVENOUS | Status: AC
  Administered 2012-06-07: 500 mL via INTRAVENOUS

## 2012-06-07 MED ORDER — ACETAMINOPHEN 325 MG PO TABS
ORAL_TABLET | ORAL | Status: AC
  Filled 2012-06-07: qty 3

## 2012-06-07 MED ORDER — ACETAMINOPHEN 500 MG PO TABS
1000.0000 mg | ORAL_TABLET | Freq: Once | ORAL | Status: AC
  Administered 2012-06-07: 975 mg via ORAL
  Filled 2012-06-07: qty 2

## 2012-06-07 MED ORDER — SODIUM CHLORIDE 0.9 % IV BOLUS (SEPSIS): 500.0000 mL | INTRAVENOUS | Status: DC

## 2012-06-07 MED ORDER — IBUPROFEN 800 MG PO TABS
800.0000 mg | ORAL_TABLET | Freq: Once | ORAL | Status: AC
  Administered 2012-06-07: 800 mg via ORAL
  Filled 2012-06-07: qty 1

## 2012-06-07 MED ORDER — SODIUM CHLORIDE 0.9 % IV BOLUS (SEPSIS)
1000.0000 mL | Freq: Once | INTRAVENOUS | Status: AC
  Administered 2012-06-07: 1000 mL via INTRAVENOUS

## 2012-06-07 NOTE — ED Provider Notes (Signed)
4:30 PM BP 129/59  Pulse 91  Temp 98.2 F (36.8 C) (Oral)  Resp 23  SpO2 100% Accepted care of patient from [facility] PA-C. Patient presented today with complaint of weakness dizziness which occurred after making a bowel movement this morning. She did request questionable loss of consciousness. She was evaluated for the same complaint previously at the end of July. She has a history of anemia of chronic disease and is followed by hematology. All labs appear to be at baseline today. Vision has received 2 L of normal saline. Orthostatic vital signs are negative. Patient still complains of dizziness and walking. She does become slightly tachycardic from 99-104 I spoke with Dr.  Rhunette Croft who agrees that another 1/2 liter bolus of fluid may help.  After that I sill d/c her to follow up. 8:18 PM Patient received another half liter bolus of saline. She states that she is still dizzy when she stands. And when she walks. She states that this is also the second time she has had a syncopal episode both times have occurred after using the restroom. She states that she has associated nausea cold sweat before she loses consciousness. I have suggested that she followup as soon as possible with both her primary care physician, Dr. [name], and her hematologist. I spoke with her about the various reasons that a person may lose consciousness. And suggested that she may need followup with neurology and/or cardiology. Patient is safe to ambulate slowly. I will discharge her home with outpatient followup.  8:42 PM I spoke with the patient and her son about her work up here in July.  She has not had a FOBT./  Although her anemia is stable, she always has black stool due to her Iron shots.  I explained to her that it could be masking an occult bleed and she should have her PCP follow up.  She is safe to D/c and able to ambulate slowly , unassisted. VS are stable. BP 116/45  Pulse 88  Temp 98.2 F (36.8 C) (Oral)  Resp  16  SpO2 98%   Arthor Captain, PA-C 06/07/12 2046

## 2012-06-07 NOTE — ED Notes (Addendum)
Pt reports she was using restroom when she had sudden onset of dizziness. Pt states she made it to the bed and thinks she passed out. Denies chest pain. Reports mild sob. EKG shows sinus rhythm. gcs 15. Pt states she just feels tired doesn't have any energy. Pt cool and clammy.

## 2012-06-07 NOTE — ED Notes (Addendum)
Patient endorses fatigue.

## 2012-06-07 NOTE — ED Provider Notes (Signed)
History     CSN: [id number]  Arrival date & time 06/07/12  1003   First MD Initiated Contact with Patient 06/07/12 1038      Chief Complaint  Patient presents with  . Loss of Consciousness    (Consider location/radiation/quality/duration/timing/severity/associated sxs/prior treatment) HPI Patient since emergency department with weakness, and possible syncopal event that occurred earlier this morning.  Patient states that she was in the bathroom and she started feeling weak and like she might pass out, so she went into the bedroom and laid on the bed and thinks she may have passed out at that time.  Patient states that she's not had any nausea, vomiting, diarrhea,  Abdominal pain, chest pain, shortness of breath, bloody Stools, visual changes, cough, or numbness .  Patient, states that at this point, she just feels tired.patient, states that nothing seems to make the condition better or worse.patient, states she was recently seen in the emergency department for similar symptoms at the end of July. Past Medical History  Diagnosis Date  . Anemia of chronic disease 08/31/2011  . Anxiety   . Hypertension   . Anemia of chronic disease 04/16/2012    Past Surgical History  Procedure Date  . Abdominal hysterectomy   . Appendectomy     Family History  Problem Relation Age of Onset  . Cancer Father     History  Substance Use Topics  . Smoking status: Former Games developer  . Smokeless tobacco: Not on file  . Alcohol Use: 0.6 oz/week    1 Glasses of wine per week    OB History    Grav Para Term Preterm Abortions TAB SAB Ect Mult Living                  Review of Systems All other systems negative except as documented in the HPI. All pertinent positives and negatives as reviewed in the HPI.  Allergies  Review of patient's allergies indicates no known allergies.  Home Medications   Current Outpatient Rx  Name Route Sig Dispense Refill  . ALLOPURINOL 100 MG PO TABS Oral Take 100  mg by mouth daily.     [name] AMLODIPINE BESYLATE-VALSARTAN 5-160 MG PO TABS Oral Take 1 tablet by mouth daily.     [name] CALCIUM CARBONATE 600 MG PO TABS Oral Take 600 mg by mouth daily.    [name] DICLOFENAC SODIUM 1 % TD GEL Topical Apply 2 g topically 3 (three) times daily as needed. For arthritis pain    . INTEGRA PLUS PO CAPS  TAKE ONE CAPSULE BY MOUTH ONE TIME DAILY 30 capsule 6  . FOLIC ACID 400 MCG PO TABS Oral Take 400-800 mcg by mouth daily.    [name] FISH OIL 500 MG PO CAPS Oral Take 1 capsule by mouth daily.    [name] VITAMIN C 500 MG PO TABS Oral Take 500 mg by mouth daily.      BP 126/56  Pulse 74  Temp 98.2 F (36.8 C) (Oral)  Resp 20  SpO2 100%  Physical Exam  Nursing note and vitals reviewed. Constitutional: She is oriented to person, place, and time. She appears well-developed and well-nourished. No distress.  HENT:  Head: Normocephalic and atraumatic.  Mouth/Throat: Oropharynx is clear and moist.  Eyes: EOM are normal. Pupils are equal, round, and reactive to light.  Neck: Normal range of motion. Neck supple.  Cardiovascular: Normal rate, regular rhythm and normal heart sounds.  Exam reveals no gallop and no friction rub.  No murmur heard. Pulmonary/Chest: Effort normal and breath sounds normal. No respiratory distress. She has no wheezes.  Abdominal: Soft. Bowel sounds are normal. There is no tenderness.  Neurological: She is alert and oriented to person, place, and time. She has normal strength. No sensory deficit. Coordination and gait normal. GCS eye subscore is 4. GCS verbal subscore is 5. GCS motor subscore is 6.  Skin: Skin is warm and dry. No rash noted.  Psychiatric: She has a normal mood and affect. Thought content normal.    ED Course  Procedures (including critical care time)  Labs Reviewed  CBC WITH DIFFERENTIAL - Abnormal; Notable for the following:    RBC 3.18 (*)     Hemoglobin 10.5 (*)     HCT 32.0 (*)     MCV 100.6 (*)     Neutrophils Relative 79 (*)     All  other components within normal limits  COMPREHENSIVE METABOLIC PANEL - Abnormal; Notable for the following:    Glucose, Bld 103 (*)     BUN 24 (*)     Creatinine, Ser 1.38 (*)     Albumin 3.4 (*)     AST 69 (*)     ALT 46 (*)     Total Bilirubin 0.2 (*)     GFR calc non Af Amer 36 (*)     GFR calc Af Amer 41 (*)     All other components within normal limits  URINALYSIS, ROUTINE W REFLEX MICROSCOPIC   Dg Chest 2 View  06/07/2012  *RADIOLOGY REPORT*  Clinical Data: Syncope times two today.  History of hypertension, anemia, fatigue, nonsmoker.  CHEST - 2 VIEW  Comparison: 05/18/2012  Findings: Cardiomediastinal silhouette is within normal limits. The lungs are free of focal consolidations and pleural effusions. No edema.  IMPRESSION: No evidence for acute cardiopulmonary abnormality.  Original Report Authenticated By: Patterson Hammersmith, M.D.   Ct Head Wo Contrast  06/07/2012    *RADIOLOGY REPORT*  Clinical Data: Weakness.  CT HEAD WITHOUT CONTRAST  Technique:  Contiguous axial images were obtained from the base of the skull through the vertex without contrast.  Comparison: Head CT scan 06/23/2011 and brain MRI 06/25/2011  Findings: There is some chronic microvascular ischemic change and mild cortical atrophy.  No evidence of acute abnormality including infarction, hemorrhage, midline shift, mass, mass effect or abnormal extra-axial fluid collection is identified.  There is no hydrocephalus or pneumocephalus.  The calvarium is intact.  Imaged paranasal sinuses and mastoid air cells are clear.  IMPRESSION: No acute finding.  Original Report Authenticated By: Bernadene Bell. Maricela Curet, M.D.       MDM  MDM Reviewed: nursing note and vitals Interpretation: labs, ECG, x-ray and CT scan    Date: 06/07/2012  Rate: 76  Rhythm: normal sinus rhythm  QRS Axis: normal  Intervals: normal  ST/T Wave abnormalities: normal  Conduction Disutrbances:none  Narrative Interpretation:   Old EKG Reviewed:  unchanged         Carlyle Dolly, PA-C 06/07/12 1513

## 2012-06-07 NOTE — ED Provider Notes (Signed)
Medical screening examination/treatment/procedure(s) were conducted as a shared visit with non-physician practitioner(s) and myself.  I personally evaluated the patient during the encounter  Patient here with dizziness that occurred after she had a bowel movement. No chest pain, chest pressure. No shortness of breath or diaphoresis. No anginal symptoms. Patient's labs and x-rays reviewed and are symptoms are likely from dehydration. She is stable for discharge  Toy Baker, MD 06/07/12 1539

## 2012-06-08 NOTE — ED Provider Notes (Signed)
Medical screening examination/treatment/procedure(s) were conducted as a shared visit with non-physician practitioner(s) and myself.  I personally evaluated the patient during the encounter  Kelly Baker, MD 06/08/12 [phone]

## 2012-06-08 NOTE — ED Provider Notes (Signed)
Medical screening examination/treatment/procedure(s) were conducted as a shared visit with non-physician practitioner(s) and myself.  I personally evaluated the patient during the encounter  Meila Berke T Ezeriah Luty, MD 06/08/12 0831 

## 2012-06-11 ENCOUNTER — Ambulatory Visit (HOSPITAL_BASED_OUTPATIENT_CLINIC_OR_DEPARTMENT_OTHER): Payer: PRIVATE HEALTH INSURANCE

## 2012-06-11 ENCOUNTER — Other Ambulatory Visit (HOSPITAL_BASED_OUTPATIENT_CLINIC_OR_DEPARTMENT_OTHER): Payer: PRIVATE HEALTH INSURANCE | Admitting: Lab

## 2012-06-11 ENCOUNTER — Other Ambulatory Visit: Payer: Self-pay | Admitting: *Deleted

## 2012-06-11 ENCOUNTER — Telehealth: Payer: Self-pay | Admitting: *Deleted

## 2012-06-11 VITALS — BP 124/62 | HR 76 | Temp 97.1°F

## 2012-06-11 DIAGNOSIS — D638 Anemia in other chronic diseases classified elsewhere: Secondary | ICD-10-CM

## 2012-06-11 DIAGNOSIS — N289 Disorder of kidney and ureter, unspecified: Secondary | ICD-10-CM

## 2012-06-11 LAB — CBC WITH DIFFERENTIAL/PLATELET
BASO%: 1.2 % (ref 0.0–2.0)
HCT: 31.1 % — ABNORMAL LOW (ref 34.8–46.6)
MCHC: 32.8 g/dL (ref 31.5–36.0)
MONO#: 0.5 10*3/uL (ref 0.1–0.9)
NEUT%: 63.2 % (ref 38.4–76.8)
RBC: 3.08 10*6/uL — ABNORMAL LOW (ref 3.70–5.45)
RDW: 13.6 % (ref 11.2–14.5)
WBC: 4.8 10*3/uL (ref 3.9–10.3)
lymph#: 1.1 10*3/uL (ref 0.9–3.3)

## 2012-06-11 MED ORDER — DARBEPOETIN ALFA-POLYSORBATE 300 MCG/0.6ML IJ SOLN
300.0000 ug | Freq: Once | INTRAMUSCULAR | Status: AC
  Administered 2012-06-11: 300 ug via SUBCUTANEOUS
  Filled 2012-06-11: qty 0.6

## 2012-06-11 NOTE — Progress Notes (Signed)
Pt in looby here for injection, request to speak with MD. Sherron Monday with pt in lobby and states " I went to the ER on sun because I passed out and they checked my blood and it was 10 and gave me 2 1/2 bags of fluids. They said I was okay and I was dehydrated,. i just want to let MD klnow what was going on. I've been so tired."  Discussed with MD pt concerns. Per MD pt to get Iron studies/ferrition today prior to leaving. ONC TX sent for labs/appt. Discussed with pt MD's instructions. Pt declined to have labs drawn as she was unable to stay due to her ride(friend)had children at home and needed to go. Offered pt appt Friday 8/23 or Monday for labs. Pt declined as she would need to coordinate a ride with her son.. Pt advised she will call back and schedule a lab appt at a different time.

## 2012-06-11 NOTE — Telephone Encounter (Signed)
Patient does not want to go back to the lab no need to add on lab

## 2012-06-12 MED ORDER — MIDAZOLAM HCL 2 MG/2ML IJ SOLN
INTRAMUSCULAR | Status: AC
  Filled 2012-06-12: qty 6

## 2012-06-12 MED ORDER — FENTANYL CITRATE 0.05 MG/ML IJ SOLN
INTRAMUSCULAR | Status: AC
  Filled 2012-06-12: qty 6

## 2012-06-15 ENCOUNTER — Other Ambulatory Visit (HOSPITAL_BASED_OUTPATIENT_CLINIC_OR_DEPARTMENT_OTHER): Payer: PRIVATE HEALTH INSURANCE | Admitting: Lab

## 2012-06-15 DIAGNOSIS — D649 Anemia, unspecified: Secondary | ICD-10-CM

## 2012-06-15 LAB — CBC WITH DIFFERENTIAL/PLATELET
Basophils Absolute: 0.1 10*3/uL (ref 0.0–0.1)
Eosinophils Absolute: 0.2 10*3/uL (ref 0.0–0.5)
HCT: 30.9 % — ABNORMAL LOW (ref 34.8–46.6)
HGB: 10.3 g/dL — ABNORMAL LOW (ref 11.6–15.9)
MONO#: 0.4 10*3/uL (ref 0.1–0.9)
NEUT#: 2.8 10*3/uL (ref 1.5–6.5)
NEUT%: 63.8 % (ref 38.4–76.8)
RDW: 13.9 % (ref 11.2–14.5)
WBC: 4.3 10*3/uL (ref 3.9–10.3)
lymph#: 0.8 10*3/uL — ABNORMAL LOW (ref 0.9–3.3)

## 2012-06-16 ENCOUNTER — Other Ambulatory Visit: Payer: Self-pay

## 2012-06-16 ENCOUNTER — Encounter (HOSPITAL_COMMUNITY): Payer: Self-pay | Admitting: Emergency Medicine

## 2012-06-16 ENCOUNTER — Emergency Department (HOSPITAL_COMMUNITY)
Admission: EM | Admit: 2012-06-16 | Discharge: 2012-06-16 | Disposition: A | Discharge status: Home / Self Care | Payer: PRIVATE HEALTH INSURANCE | Attending: Emergency Medicine | Admitting: Emergency Medicine

## 2012-06-16 DIAGNOSIS — D638 Anemia in other chronic diseases classified elsewhere: Secondary | ICD-10-CM | POA: Insufficient documentation

## 2012-06-16 DIAGNOSIS — Z87891 Personal history of nicotine dependence: Secondary | ICD-10-CM | POA: Insufficient documentation

## 2012-06-16 DIAGNOSIS — I10 Essential (primary) hypertension: Secondary | ICD-10-CM | POA: Insufficient documentation

## 2012-06-16 DIAGNOSIS — R55 Syncope and collapse: Secondary | ICD-10-CM | POA: Insufficient documentation

## 2012-06-16 LAB — CBC WITH DIFFERENTIAL/PLATELET
Basophils Relative: 1 % (ref 0–1)
Eosinophils Absolute: 0.2 10*3/uL (ref 0.0–0.7)
HCT: 31.5 % — ABNORMAL LOW (ref 36.0–46.0)
Hemoglobin: 10.3 g/dL — ABNORMAL LOW (ref 12.0–15.0)
Lymphs Abs: 0.7 10*3/uL (ref 0.7–4.0)
MCH: 33.6 pg (ref 26.0–34.0)
MCHC: 32.7 g/dL (ref 30.0–36.0)
Monocytes Absolute: 0.6 10*3/uL (ref 0.1–1.0)
Monocytes Relative: 11 % (ref 3–12)
Neutro Abs: 3.7 10*3/uL (ref 1.7–7.7)
RBC: 3.07 MIL/uL — ABNORMAL LOW (ref 3.87–5.11)

## 2012-06-16 LAB — BASIC METABOLIC PANEL
BUN: 17 mg/dL (ref 6–23)
Chloride: 104 mEq/L (ref 96–112)
Creatinine, Ser: 1.38 mg/dL — ABNORMAL HIGH (ref 0.50–1.10)
GFR calc Af Amer: 41 mL/min — ABNORMAL LOW (ref >90)
Glucose, Bld: 102 mg/dL — ABNORMAL HIGH (ref 70–99)

## 2012-06-16 NOTE — ED Notes (Signed)
Pt sts c/o near syncopal with diaphoresis and SOB today after using the bathroom; pt sts hx of same a couple of days ago for same; pt denies CP

## 2012-06-16 NOTE — ED Provider Notes (Signed)
I saw and evaluated the patient, reviewed the resident's note and I agree with the findings and plan.  Cheri Guppy, MD 06/16/12 [phone]

## 2012-06-16 NOTE — ED Provider Notes (Signed)
I saw and evaluated the patient, reviewed the resident's note and I agree with the findings and plan.  78 year old, female, with no history of coronary disease, or arrhythmia.  Presents emergency department complaining of a syncopal episode.  She states that she has had 3 or 4.  These episodes.  Usually they occur after she has had a bowel movement.  She becomes tachycardic, diaphoretic, and weak.  Today.  Her symptoms occurred after having a bowel movement.  She walked to her bed and then laid down and passed out for a few minutes.  On the bed.  She denies recent illness.  She states that her symptoms are completely resolved.  At this time.  Her physical examination is unremarkable.  Since she is on a diuretic.  We will perform laboratory test, and I suspect.  She needs to be seen by a cardiologist, for further evaluation of her symptoms.  Cheri Guppy, MD 06/16/12 1220

## 2012-06-16 NOTE — ED Provider Notes (Signed)
History     CSN: [id number]  Arrival date & time 06/16/12  1478   First MD Initiated Contact with Patient 06/16/12 1020      Chief Complaint  Patient presents with  . Near Syncope  . Shortness of Breath    (Consider location/radiation/quality/duration/timing/severity/associated sxs/prior treatment) Patient is a 78 y.o. female presenting with syncope. The history is provided by the patient and a relative (son).  Loss of Consciousness This is a recurrent problem. The current episode started today. The problem occurs intermittently. The problem has been resolved. Associated symptoms include diaphoresis and weakness (generalized). Pertinent negatives include no abdominal pain, change in bowel habit, chest pain, coughing, fever, headaches, nausea, numbness, urinary symptoms or vomiting. Associated symptoms comments: palpitations. Exacerbated by: This is patient's 3rd episode in past month.  Episodes have tended to occur after bowel movment.  Pt has history of chronic anemia. She has tried nothing for the symptoms.    Past Medical History  Diagnosis Date  . Anemia of chronic disease 08/31/2011  . Anxiety   . Hypertension   . Anemia of chronic disease 04/16/2012    Past Surgical History  Procedure Date  . Abdominal hysterectomy   . Appendectomy     Family History  Problem Relation Age of Onset  . Cancer Father     History  Substance Use Topics  . Smoking status: Former Games developer  . Smokeless tobacco: Not on file  . Alcohol Use: 0.6 oz/week    1 Glasses of wine per week    OB History    Grav Para Term Preterm Abortions TAB SAB Ect Mult Living                  Review of Systems  Constitutional: Positive for diaphoresis. Negative for fever and appetite change.  HENT: Negative.   Eyes: Negative.   Respiratory: Negative for cough and shortness of breath.   Cardiovascular: Positive for syncope. Negative for chest pain, palpitations and leg swelling.  Gastrointestinal:  Negative.  Negative for nausea, vomiting, abdominal pain, diarrhea, constipation, blood in stool and change in bowel habit.  Genitourinary: Negative.  Negative for dysuria, urgency, hematuria, flank pain and difficulty urinating.  Musculoskeletal: Negative.   Skin: Negative.   Neurological: Positive for syncope, weakness (generalized) and light-headedness. Negative for dizziness, facial asymmetry, speech difficulty, numbness and headaches.  All other systems reviewed and are negative.    Allergies  Review of patient's allergies indicates no known allergies.  Home Medications   Current Outpatient Rx  Name Route Sig Dispense Refill  . ALLOPURINOL 100 MG PO TABS Oral Take 100 mg by mouth daily.     [name] AMLODIPINE-VALSARTAN-HCTZ 5-160-12.5 MG PO TABS Oral Take 1 tablet by mouth daily.    [name] CALCIUM CARBONATE 600 MG PO TABS Oral Take 600 mg by mouth daily.    . COLCHICINE 0.6 MG PO TABS Oral Take 0.6 mg by mouth daily.    [name] DICLOFENAC SODIUM 1 % TD GEL Topical Apply 2 g topically 3 (three) times daily as needed. For arthritis pain    . INTEGRA PLUS PO Oral Take 1 tablet by mouth daily.    [name] FOLIC ACID 400 MCG PO TABS Oral Take 400-800 mcg by mouth daily.    [name] FISH OIL 500 MG PO CAPS Oral Take 1 capsule by mouth daily.    [name] VITAMIN C 500 MG PO TABS Oral Take 500 mg by mouth daily.      BP 115/51  Pulse  70  Temp 97.6 F (36.4 C) (Oral)  Resp 21  SpO2 99%  Physical Exam  Nursing note and vitals reviewed. Constitutional: She is oriented to person, place, and time. She appears well-developed and well-nourished. No distress.  HENT:  Head: Normocephalic and atraumatic.  Eyes: Conjunctivae and EOM are normal. Pupils are equal, round, and reactive to light.  Neck: Neck supple.  Cardiovascular: Normal rate, regular rhythm, normal heart sounds and intact distal pulses.   No murmur heard. Pulmonary/Chest: Effort normal and breath sounds normal. She has no wheezes. She has no rales.    Abdominal: Soft. She exhibits no distension. There is no tenderness.  Musculoskeletal: Normal range of motion. She exhibits no edema and no tenderness.  Neurological: She is alert and oriented to person, place, and time. She has normal strength. No cranial nerve deficit or sensory deficit. Coordination normal. GCS eye subscore is 4. GCS verbal subscore is 5. GCS motor subscore is 6.  Skin: Skin is warm and dry.    ED Course  Procedures (including critical care time)  Labs Reviewed  CBC WITH DIFFERENTIAL - Abnormal; Notable for the following:    RBC 3.07 (*)     Hemoglobin 10.3 (*)     HCT 31.5 (*)     MCV 102.6 (*)     All other components within normal limits  BASIC METABOLIC PANEL - Abnormal; Notable for the following:    Glucose, Bld 102 (*)     Creatinine, Ser 1.38 (*)     GFR calc non Af Amer 36 (*)     GFR calc Af Amer 41 (*)     All other components within normal limits   No results found.   1. Syncope       MDM  78 yo female with PMHx of HTN and chronic anemia who presents for a syncopal episode that occurred today after having a bowel movement.  This is her 3rd syncopal episode in the past month.  After standing up, pt felt diaphoretic, lightheaded and had palpitations.  She laid down on her bed and son reports loss of consciousness for approximately 30 seconds with quick return to baseline mental status.  No chest pain or shortness of breath.  No focal weakness.  Pt takes iron supplements and reports black stool.  AF, VSS, NAD at time of exam.    Pt had CBC yesterday with Hgb 10.3 at baseline.  Doubt sx due to anemia.  Pt does not appear clinically dehydrated.  Will get EKG and BMP.  Will consult Cardiology to try to arrange for close follow-up for further cardiac work-up.  ECG: rate 70, NSR, nml axis and intervals, no hypertrophy, no ST or T wave changes.  No signs of worsening anemia.  CMP wnl.  Cr 1.38 at baseline.    Pt discussed with Cardiology who will contact  pt to arrange close follow-up this week.  Tx plan discussed with pt who voiced understanding.  Return precautions provided.        Cherre Robins, MD 06/16/12 [phone]

## 2012-06-16 NOTE — ED Notes (Addendum)
States had a syncopal episode after using bathroom this morning. Reports sudden onset nausea, heart beating fast, diaphoretic, clammy, felt lightheaded & dizzy. States she walked to bedroom, laid down then "passed out for a minute". States episode lasted approx 10 min & now feels "drained & weak". Denies CP, n/v/d, fever, bloody stools. States has history of anemia & has received injections in past (received last Thurs)

## 2012-07-03 ENCOUNTER — Encounter: Payer: Self-pay | Admitting: *Deleted

## 2012-07-03 DIAGNOSIS — I10 Essential (primary) hypertension: Secondary | ICD-10-CM | POA: Insufficient documentation

## 2012-07-03 DIAGNOSIS — F419 Anxiety disorder, unspecified: Secondary | ICD-10-CM | POA: Insufficient documentation

## 2012-07-06 ENCOUNTER — Encounter: Payer: Self-pay | Admitting: *Deleted

## 2012-07-06 ENCOUNTER — Ambulatory Visit (INDEPENDENT_AMBULATORY_CARE_PROVIDER_SITE_OTHER): Payer: PRIVATE HEALTH INSURANCE | Admitting: Cardiovascular Disease

## 2012-07-06 VITALS — BP 139/70 | HR 75 | Ht 63.0 in | Wt 131.0 lb

## 2012-07-06 DIAGNOSIS — E789 Disorder of lipoprotein metabolism, unspecified: Secondary | ICD-10-CM | POA: Insufficient documentation

## 2012-07-06 DIAGNOSIS — Z7289 Other problems related to lifestyle: Secondary | ICD-10-CM | POA: Insufficient documentation

## 2012-07-06 DIAGNOSIS — I10 Essential (primary) hypertension: Secondary | ICD-10-CM

## 2012-07-06 DIAGNOSIS — K759 Inflammatory liver disease, unspecified: Secondary | ICD-10-CM | POA: Insufficient documentation

## 2012-07-06 DIAGNOSIS — R55 Syncope and collapse: Secondary | ICD-10-CM

## 2012-07-06 NOTE — Assessment & Plan Note (Signed)
Cholesterol is at goal.  Continue current dose of statin and diet Rx.  No myalgias or side effects.  F/U  LFT's in 6 months. Lab Results  Component Value Date   [facility]  Value: 39        Total Cholesterol/HDL:CHD Risk Coronary Heart Disease Risk Table                     Men   Women  1/2 Average Risk   3.4   3.3 06/26/2008

## 2012-07-06 NOTE — Assessment & Plan Note (Signed)
Well controlled.  Continue current medications and low sodium Dash type diet.    

## 2012-07-06 NOTE — Assessment & Plan Note (Signed)
Doubt cardiac etiology with normal exam and ECG  F/U echo Sounds vasovagal and issue of ETOH not clear

## 2012-07-06 NOTE — Progress Notes (Signed)
Patient ID: [name], female   DOB: [birth year], 78 y.o.   MRN: [id number] 78 yo patient of Dr [name] Seen in ER x2 for "synocpe"  Reviewed records from 7/29 and 8/18  Both times involved going to bathroom  Had diaphoresis lightheadedness and "passed out'  Felt tired and layed in the bed.  Both ER w/u;s negative with R/O not postural no arrhythmia and normal ECG.  No previous cardiac problems.  She cryptically asked me if alcohol could cause this but denies excessive ETOH>  No chest pain.  Mild palpitations after she got sweaty.  No recurrence since 8/18.  Compliant with meds  She is sedentary at home but does all ADL's  ROS: Denies fever, malais, weight loss, blurry vision, decreased visual acuity, cough, sputum, SOB, hemoptysis, pleuritic pain, palpitaitons, heartburn, abdominal pain, melena, lower extremity edema, claudication, or rash.  All other systems reviewed and negative   General: Affect appropriate Healthy:  appears stated age HEENT: normal Neck supple with no adenopathy JVP normal no bruits no thyromegaly Lungs clear with no wheezing and good diaphragmatic motion Heart:  S1/S2 no murmur,rub, gallop or click PMI normal Abdomen: benighn, BS positve, no tenderness, no AAA no bruit.  No HSM or HJR Distal pulses intact with no bruits No edema Neuro non-focal Skin warm and dry No muscular weakness  Medications Current Outpatient Prescriptions  Medication Sig Dispense Refill  . allopurinol (ZYLOPRIM) 100 MG tablet Take 100 mg by mouth daily.       . Amlodipine-Valsartan-HCTZ (EXFORGE HCT) 5-160-12.5 MG TABS Take 1 tablet by mouth daily.      . calcium carbonate (OS-CAL) 600 MG TABS Take 600 mg by mouth daily.      . colchicine 0.6 MG tablet Take 0.6 mg by mouth daily.      . diclofenac sodium (VOLTAREN) 1 % GEL Apply 2 g topically 3 (three) times daily as needed. For arthritis pain      . FeFum-FePoly-FA-B Cmp-C-Biot (INTEGRA PLUS PO) Take 1 tablet by mouth daily.      .  folic acid (FOLVITE) 400 MCG tablet Take 400-800 mcg by mouth daily.      . Omega-3 Fatty Acids (FISH OIL) 500 MG CAPS Take 1 capsule by mouth daily.      . vitamin C (ASCORBIC ACID) 500 MG tablet Take 500 mg by mouth daily.        Allergies Review of patient's allergies indicates no known allergies.  Family History: Family History  Problem Relation Age of Onset  . Cancer Father     Social History: History   Social History  . Marital Status: Widowed    Spouse Name: N/A    Number of Children: N/A  . Years of Education: N/A   Occupational History  . Not on file.   Social History Main Topics  . Smoking status: Former Games developer  . Smokeless tobacco: Not on file  . Alcohol Use: 0.6 oz/week    1 Glasses of wine per week  . Drug Use: No  . Sexually Active: Not Currently   Other Topics Concern  . Not on file   Social History Narrative  . No narrative on file    Electrocardiogram:  05/18/12 and 06/07/12  NSR normal ECG  Assessment and Plan

## 2012-07-06 NOTE — Patient Instructions (Signed)
Your physician recommends that you schedule a follow-up appointment in: AS NEEDED Your physician recommends that you continue on your current medications as directed. Please refer to the Current Medication list given to you today. Your physician has requested that you have an echocardiogram. Echocardiography is a painless test that uses sound waves to create images of your heart. It provides your doctor with information about the size and shape of your heart and how well your heart's chambers and valves are working. This procedure takes approximately one hour. There are no restrictions for this procedure.  DX SYNCOPE

## 2012-07-06 NOTE — Assessment & Plan Note (Signed)
She did not volunteer how much she drinks but said " I stopped it"  Given that she asked if it could be related to syncope I suspect it may be an issue.  Told her it could certainly contribute due to its vasodilatory properties and diuresis

## 2012-07-09 ENCOUNTER — Ambulatory Visit: Payer: PRIVATE HEALTH INSURANCE

## 2012-07-09 ENCOUNTER — Other Ambulatory Visit: Payer: Self-pay | Admitting: Oncology

## 2012-07-09 ENCOUNTER — Other Ambulatory Visit (HOSPITAL_BASED_OUTPATIENT_CLINIC_OR_DEPARTMENT_OTHER): Payer: PRIVATE HEALTH INSURANCE | Admitting: Lab

## 2012-07-09 ENCOUNTER — Telehealth: Payer: Self-pay | Admitting: *Deleted

## 2012-07-09 DIAGNOSIS — D638 Anemia in other chronic diseases classified elsewhere: Secondary | ICD-10-CM

## 2012-07-09 DIAGNOSIS — Z1231 Encounter for screening mammogram for malignant neoplasm of breast: Secondary | ICD-10-CM

## 2012-07-09 DIAGNOSIS — D649 Anemia, unspecified: Secondary | ICD-10-CM

## 2012-07-09 LAB — CBC WITH DIFFERENTIAL/PLATELET
BASO%: 0.6 % (ref 0.0–2.0)
EOS%: 4.3 % (ref 0.0–7.0)
HCT: 34.3 % — ABNORMAL LOW (ref 34.8–46.6)
LYMPH%: 22.5 % (ref 14.0–49.7)
MCH: 33.9 pg (ref 25.1–34.0)
MCHC: 33 g/dL (ref 31.5–36.0)
MCV: 102.6 fL — ABNORMAL HIGH (ref 79.5–101.0)
MONO#: 0.4 10*3/uL (ref 0.1–0.9)
MONO%: 10.3 % (ref 0.0–14.0)
NEUT%: 62.3 % (ref 38.4–76.8)
Platelets: 216 10*3/uL (ref 145–400)
RBC: 3.34 10*6/uL — ABNORMAL LOW (ref 3.70–5.45)
WBC: 3.5 10*3/uL — ABNORMAL LOW (ref 3.9–10.3)

## 2012-07-09 LAB — FERRITIN: Ferritin: 70 ng/mL (ref 10–291)

## 2012-07-09 MED ORDER — DARBEPOETIN ALFA-POLYSORBATE 500 MCG/ML IJ SOLN: 300.0000 ug | Freq: Once | INTRAMUSCULAR | Status: DC

## 2012-07-09 NOTE — Telephone Encounter (Signed)
[facility]

## 2012-07-10 ENCOUNTER — Telehealth: Payer: Self-pay | Admitting: *Deleted

## 2012-07-10 NOTE — Telephone Encounter (Signed)
Per staff message and POf I have scheduled appts.  JMW  

## 2012-07-10 NOTE — Telephone Encounter (Signed)
IV iron on 07/14/12  Sent michelle email to set up patients iron

## 2012-07-14 ENCOUNTER — Ambulatory Visit (HOSPITAL_COMMUNITY): Payer: PRIVATE HEALTH INSURANCE | Attending: Cardiovascular Disease | Admitting: Radiology

## 2012-07-14 ENCOUNTER — Ambulatory Visit: Payer: PRIVATE HEALTH INSURANCE

## 2012-07-14 DIAGNOSIS — I10 Essential (primary) hypertension: Secondary | ICD-10-CM | POA: Insufficient documentation

## 2012-07-14 DIAGNOSIS — I369 Nonrheumatic tricuspid valve disorder, unspecified: Secondary | ICD-10-CM | POA: Insufficient documentation

## 2012-07-14 DIAGNOSIS — I059 Rheumatic mitral valve disease, unspecified: Secondary | ICD-10-CM | POA: Insufficient documentation

## 2012-07-14 DIAGNOSIS — R55 Syncope and collapse: Secondary | ICD-10-CM | POA: Insufficient documentation

## 2012-07-14 DIAGNOSIS — I379 Nonrheumatic pulmonary valve disorder, unspecified: Secondary | ICD-10-CM | POA: Insufficient documentation

## 2012-07-14 NOTE — Progress Notes (Signed)
Echocardiogram performed.  

## 2012-07-17 ENCOUNTER — Ambulatory Visit (HOSPITAL_COMMUNITY)
Admission: RE | Admit: 2012-07-17 | Discharge: 2012-07-17 | Disposition: A | Discharge status: Home / Self Care | Payer: PRIVATE HEALTH INSURANCE | Source: Ambulatory Visit | Attending: Oncology | Admitting: Oncology

## 2012-07-17 DIAGNOSIS — Z1231 Encounter for screening mammogram for malignant neoplasm of breast: Secondary | ICD-10-CM | POA: Insufficient documentation

## 2012-07-21 ENCOUNTER — Other Ambulatory Visit: Payer: Self-pay | Admitting: Oncology

## 2012-07-21 DIAGNOSIS — R928 Other abnormal and inconclusive findings on diagnostic imaging of breast: Secondary | ICD-10-CM

## 2012-08-06 ENCOUNTER — Ambulatory Visit: Payer: PRIVATE HEALTH INSURANCE

## 2012-08-06 ENCOUNTER — Other Ambulatory Visit (HOSPITAL_BASED_OUTPATIENT_CLINIC_OR_DEPARTMENT_OTHER): Payer: PRIVATE HEALTH INSURANCE | Admitting: Lab

## 2012-08-06 DIAGNOSIS — N289 Disorder of kidney and ureter, unspecified: Secondary | ICD-10-CM

## 2012-08-06 DIAGNOSIS — D638 Anemia in other chronic diseases classified elsewhere: Secondary | ICD-10-CM

## 2012-08-06 LAB — CBC WITH DIFFERENTIAL/PLATELET
Basophils Absolute: 0 10*3/uL (ref 0.0–0.1)
EOS%: 4.6 % (ref 0.0–7.0)
Eosinophils Absolute: 0.2 10*3/uL (ref 0.0–0.5)
HCT: 33.1 % — ABNORMAL LOW (ref 34.8–46.6)
HGB: 11.2 g/dL — ABNORMAL LOW (ref 11.6–15.9)
MCH: 33.8 pg (ref 25.1–34.0)
MCV: 99.8 fL (ref 79.5–101.0)
MONO%: 11.8 % (ref 0.0–14.0)
NEUT#: 2.2 10*3/uL (ref 1.5–6.5)
NEUT%: 57.4 % (ref 38.4–76.8)
Platelets: 205 10*3/uL (ref 145–400)

## 2012-08-06 NOTE — Progress Notes (Signed)
Hgb 11.2. No indication for Aranesp injection. Copy of labs given to patient.

## 2012-08-13 ENCOUNTER — Other Ambulatory Visit: Payer: PRIVATE HEALTH INSURANCE

## 2012-08-14 ENCOUNTER — Other Ambulatory Visit: Payer: PRIVATE HEALTH INSURANCE

## 2012-08-18 ENCOUNTER — Telehealth: Payer: Self-pay | Admitting: Oncology

## 2012-08-18 NOTE — Telephone Encounter (Signed)
Mailed the pt her revised jan 2014 appt calendar

## 2012-08-20 ENCOUNTER — Ambulatory Visit
Admission: RE | Admit: 2012-08-20 | Discharge: 2012-08-20 | Disposition: A | Discharge status: Home / Self Care | Payer: PRIVATE HEALTH INSURANCE | Source: Ambulatory Visit | Attending: Oncology | Admitting: Oncology

## 2012-08-20 ENCOUNTER — Other Ambulatory Visit: Payer: Self-pay | Admitting: Oncology

## 2012-08-20 DIAGNOSIS — N6320 Unspecified lump in the left breast, unspecified quadrant: Secondary | ICD-10-CM

## 2012-08-20 DIAGNOSIS — R928 Other abnormal and inconclusive findings on diagnostic imaging of breast: Secondary | ICD-10-CM

## 2012-08-28 ENCOUNTER — Ambulatory Visit
Admission: RE | Admit: 2012-08-28 | Discharge: 2012-08-28 | Disposition: A | Discharge status: Home / Self Care | Payer: PRIVATE HEALTH INSURANCE | Source: Ambulatory Visit | Attending: Oncology | Admitting: Oncology

## 2012-08-28 ENCOUNTER — Other Ambulatory Visit: Payer: Self-pay | Admitting: Oncology

## 2012-08-28 DIAGNOSIS — M79621 Pain in right upper arm: Secondary | ICD-10-CM

## 2012-08-28 DIAGNOSIS — C50919 Malignant neoplasm of unspecified site of unspecified female breast: Secondary | ICD-10-CM

## 2012-08-28 DIAGNOSIS — N6320 Unspecified lump in the left breast, unspecified quadrant: Secondary | ICD-10-CM

## 2012-08-28 HISTORY — DX: Malignant neoplasm of unspecified site of unspecified female breast: C50.919

## 2012-08-31 ENCOUNTER — Telehealth: Payer: Self-pay | Admitting: *Deleted

## 2012-08-31 ENCOUNTER — Telehealth: Payer: Self-pay | Admitting: Oncology

## 2012-08-31 NOTE — Telephone Encounter (Signed)
Message copied by Cooper Render on Mon Aug 31, 2012 12:42 PM ------      Message from: Victorino December      Created: Mon Aug 31, 2012 12:39 PM       Call patient: I need to see her on 11/12 at 4:00 pm.

## 2012-08-31 NOTE — Telephone Encounter (Signed)
Called pt- received message pt's phone # has been disconnected.  Called emergency contact Micheal Comer- no voicemail set up.  Onc tx sent

## 2012-08-31 NOTE — Telephone Encounter (Signed)
S/w the pt and she is aware of her appt for 11/12/2013at 4:00pm

## 2012-09-01 ENCOUNTER — Telehealth: Payer: Self-pay | Admitting: *Deleted

## 2012-09-01 ENCOUNTER — Ambulatory Visit (HOSPITAL_BASED_OUTPATIENT_CLINIC_OR_DEPARTMENT_OTHER): Payer: PRIVATE HEALTH INSURANCE | Admitting: Oncology

## 2012-09-01 VITALS — BP 156/67 | HR 88 | Temp 97.6°F | Resp 20 | Ht 63.0 in | Wt 133.8 lb

## 2012-09-01 DIAGNOSIS — N289 Disorder of kidney and ureter, unspecified: Secondary | ICD-10-CM

## 2012-09-01 DIAGNOSIS — D638 Anemia in other chronic diseases classified elsewhere: Secondary | ICD-10-CM

## 2012-09-01 DIAGNOSIS — C50912 Malignant neoplasm of unspecified site of left female breast: Secondary | ICD-10-CM | POA: Insufficient documentation

## 2012-09-01 DIAGNOSIS — D509 Iron deficiency anemia, unspecified: Secondary | ICD-10-CM

## 2012-09-01 DIAGNOSIS — C50919 Malignant neoplasm of unspecified site of unspecified female breast: Secondary | ICD-10-CM | POA: Insufficient documentation

## 2012-09-01 DIAGNOSIS — C50219 Malignant neoplasm of upper-inner quadrant of unspecified female breast: Secondary | ICD-10-CM

## 2012-09-01 NOTE — Progress Notes (Signed)
OFFICE PROGRESS NOTE  CC Dr. Renaye Rakers  DIAGNOSIS: Anemia of chronic disease, renal insufficiency and iron deficiency.  PRIOR THERAPY: #1 Aranesp injections every 4 weeks and iron as indicated.   #2 mammogram and ultrasound guided core biopsy of the left breast  CURRENT THERAPY: Aranesp injections every 4 weeks and iron as indicated.   INTERVAL HISTORY: Patient returns in followup.she felt a mass in her left breast she went on to have a mammogram and subsequently ultrasound needle core biopsy. The pathology does show invasive ductal carcinoma although the ER/PR receptor results are pending. She is also scheduled to be seen by Dr. Claud Kelp for discussion of surgical options. Clinically she seems to be doing well and is without any significant complaints.  ALLERGIES:  None  PHYSICAL EXAMINATION:  General: Well developed, well nourished, in no acute distress.  EENT: No ocular or oral lesions. No stomatitis.  Respiratory: Lungs are clear to auscultation bilaterally with normal respiratory movement and no accessory muscle use. Cardiac: No murmur, rub or tachycardia. No upper or lower extremity edema.  GI: Abdomen is soft, no palpable hepatosplenomegaly. No fluid wave. No tenderness. Musculoskeletal: No kyphosis, no tenderness over the spine, ribs or hips. Lymph: No cervical, infraclavicular, axillary or inguinal adenopathy. Neuro: No focal neurological deficits. Psych: Alert and oriented X 3, appropriate mood and affect.  Breast examination right breast is without any masses or nipple discharge. Left breast does show a palpable mass in the upper quadrant measures about 2-3 cm there is also noted to be in medical papular rash. No nipple discharge or bleeding. ECOG PERFORMANCE STATUS: 0 - Asymptomatic  Blood pressure 156/67, pulse 88, temperature 97.6 F (36.4 C), resp. rate 20, height 5\' 3"  (1.6 m), weight 133 lb 12.8 oz (60.691 kg).  LABORATORY DATA: Lab Results  Component  Value Date   WBC 3.9 08/06/2012   HGB 11.2* 08/06/2012   HCT 33.1* 08/06/2012   MCV 99.8 08/06/2012   PLT 205 08/06/2012      Chemistry      Component Value Date/Time   NA 140 06/16/2012 1050   NA 138 04/12/2010 0954   K 4.2 06/16/2012 1050   K 4.8* 04/12/2010 0954   CL 104 06/16/2012 1050   CL 101 04/12/2010 0954   CO2 28 06/16/2012 1050   CO2 26 04/12/2010 0954   BUN 17 06/16/2012 1050   BUN 14 04/12/2010 0954   CREATININE 1.38* 06/16/2012 1050   CREATININE 1.2 04/12/2010 0954      Component Value Date/Time   CALCIUM 10.2 06/16/2012 1050   CALCIUM 9.8 04/12/2010 0954   ALKPHOS 100 06/07/2012 1042   ALKPHOS 106* 04/12/2010 0954   AST 69* 06/07/2012 1042   AST 80* 04/12/2010 0954   ALT 46* 06/07/2012 1042   BILITOT 0.2* 06/07/2012 1042   BILITOT 0.50 04/12/2010 0954      ASSESSMENT: 78 year old female with  #1 history of anemia of chronic disease and iron deficiency patient has been receiving Aranesp injections.  #2 patient would now new onset left breast cancer that is invasive ductal carcinoma. Prognostic markers are pending. Tumor measures 2.4 cm thus making it is stage II. She is scheduled to be seen by Dr. Claud Kelp. Patient and I had an extensive discussion today about her pathology. Unfortunately do not have her prognostic markers. We discussed several treatment options. I do think that she may be a lumpectomy candidate. However if she is not a lumpectomy candidate from we certainly could do neoadjuvant treatment  either with chemotherapy or antiestrogen therapy to try to shrink the tumor so that she can undergo a lumpectomy. Patient understands her options.  PLAN:   #1 patient will keep her appointment with Dr. Claud Kelp on 09/03/2012.  #2 I have set her up for MRI of the breasts.  #3 I will plan on seeing her back in a few weeks time.   All questions were answered. The patient knows to call the clinic with any problems, questions or concerns. The length of time of the  face-to-face encounter was 30  minutes. More than 50% of time was spent counseling and coordination of care.

## 2012-09-01 NOTE — Patient Instructions (Addendum)
Keep appointment with Dr. Derrell Lolling  MRI of breasts  I will see you back in 1 month or sooner

## 2012-09-01 NOTE — Telephone Encounter (Signed)
ADD ON MD APPOINTMENT FOR 10-01-2012 STARTING AT 9:30AM  PATIENT STATED THAT SHE IS SCARED OF CLOSED IN SPACES SO I HAD THE MD CHANGE THE LOCATION TO THE BREAST CENTER SO KATHY MCCONNELL CAN GIVE THE PATIENT A CALL TO SCHEDULE THE MRI BREAST

## 2012-09-02 ENCOUNTER — Encounter: Payer: Self-pay | Admitting: *Deleted

## 2012-09-02 ENCOUNTER — Ambulatory Visit: Payer: PRIVATE HEALTH INSURANCE | Admitting: Oncology

## 2012-09-03 ENCOUNTER — Ambulatory Visit: Payer: PRIVATE HEALTH INSURANCE

## 2012-09-03 ENCOUNTER — Encounter (INDEPENDENT_AMBULATORY_CARE_PROVIDER_SITE_OTHER): Payer: Self-pay | Admitting: General Surgery

## 2012-09-03 ENCOUNTER — Ambulatory Visit (INDEPENDENT_AMBULATORY_CARE_PROVIDER_SITE_OTHER): Payer: PRIVATE HEALTH INSURANCE | Admitting: General Surgery

## 2012-09-03 ENCOUNTER — Encounter: Payer: Self-pay | Admitting: Oncology

## 2012-09-03 ENCOUNTER — Other Ambulatory Visit: Payer: PRIVATE HEALTH INSURANCE | Admitting: Lab

## 2012-09-03 VITALS — BP 130/74 | HR 84 | Temp 97.9°F | Resp 12 | Ht 62.0 in | Wt 133.8 lb

## 2012-09-03 DIAGNOSIS — C50919 Malignant neoplasm of unspecified site of unspecified female breast: Secondary | ICD-10-CM

## 2012-09-03 NOTE — Patient Instructions (Signed)
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  You are  scheduled for an MRI of her breast tomorrow.  If the MRI does not show any other disease, we will proceed with surgery, which will be a left partial mastectomy with needle localization and a left axillary sentinel node biopsy.  If the MRI shows other areas of concern, then we will need to stop and possibly biopsy other areas before any surgery is done.    Lumpectomy, Breast Conserving Surgery A lumpectomy is breast surgery that removes only part of the breast. Another name used may be partial mastectomy. The amount removed varies. Make sure you understand how much of your breast will be removed. Reasons for a lumpectomy:  Any solid breast mass.  Grouped significant nodularity that may be confused with a solitary breast mass. Lumpectomy is the most common form of breast cancer surgery today. The surgeon removes the portion of your breast which contains the tumor (cancer). This is the lump. Some normal tissue around the lump is also removed to be sure that all the tumor has been removed.  If cancer cells are found in the margins where the breast tissue was removed, your surgeon will do more surgery to remove the remaining cancer tissue. This is called re-excision surgery. Radiation and/or chemotherapy treatments are often given following a lumpectomy to kill any cancer cells that could possibly remain.  REASONS YOU MAY NOT BE ABLE TO HAVE BREAST CONSERVING SURGERY:  The tumor is located in more than one place.  Your breast is small and the tumor is large so the breast would be disfigured.  The entire tumor removal is not successful with a lumpectomy.  You cannot commit to a full course of chemotherapy, radiation therapy or are pregnant and cannot have radiation.  You have previously had radiation to the breast to treat cancer. HOW A LUMPECTOMY IS  PERFORMED If overnight nursing is not required following a biopsy, a lumpectomy can be performed as a same-day surgery. This can be done in a hospital, clinic, or surgical center. The anesthesia used will depend on your surgeon. They will discuss this with you. A general anesthetic keeps you sleeping through the procedure. LET YOUR CAREGIVERS KNOW ABOUT THE FOLLOWING:  Allergies  Medications taken including herbs, eye drops, over the counter medications, and creams.  Use of steroids (by mouth or creams)  Previous problems with anesthetics or Novocaine.  Possibility of pregnancy, if this applies  History of blood clots (thrombophlebitis)  History of bleeding or blood problems.  Previous surgery  Other health problems BEFORE THE PROCEDURE You should be present one hour prior to your procedure unless directed otherwise.  AFTER THE PROCEDURE  After surgery, you will be taken to the recovery area where a nurse will watch and check your progress. Once you're awake, stable, and taking fluids well, barring other problems you will be allowed to go home.  Ice packs applied to your operative site may help with discomfort and keep the swelling down.  A small rubber drain may be placed in the breast for a couple of days to prevent a hematoma from developing in the breast.  A pressure dressing may be applied for 24 to 48 hours to prevent bleeding.  Keep the wound dry.  You may resume a normal diet and activities as directed. Avoid strenuous activities affecting the arm on the side of the biopsy site such  as tennis, swimming, heavy lifting (more than 10 pounds) or pulling.  Bruising in the breast is normal following this procedure.  Wearing a bra - even to bed - may be more comfortable and also help keep the dressing on.  Change dressings as directed.  Only take over-the-counter or prescription medicines for pain, discomfort, or fever as directed by your caregiver. Call for your results  as instructed by your surgeon. Remember it is your responsibility to get the results of your lumpectomy if your surgeon asked you to follow-up. Do not assume everything is fine if you have not heard from your caregiver. SEEK MEDICAL CARE IF:   There is increased bleeding (more than a small spot) from the wound.  You notice redness, swelling, or increasing pain in the wound.  Pus is coming from wound.  An unexplained oral temperature above 102 F (38.9 C) develops.  You notice a foul smell coming from the wound or dressing. SEEK IMMEDIATE MEDICAL CARE IF:   You develop a rash.  You have difficulty breathing.  You have any allergic problems. Document Released: 11/18/2006 Document Revised: 12/30/2011 Document Reviewed: 02/19/2007 [facility] Patient Information 2013 [place], [place].

## 2012-09-03 NOTE — Progress Notes (Addendum)
Patient ID: [name], female   DOB: 1932/01/04, 78 y.o.   MRN: [id number]  No chief complaint on file.   HPI [name] is a 78 y.o. female.  She is referred by Dr. Drue Second and by Dr. Juel Burrow at the [facility] for evaluation of a newly diagnosed invasive ductal carcinoma, left breast, upper inner quadrant.  This patient states she has never had a mammogram. She felt a lump in the left breast about 2 weeks ago and went for imaging studies. Mammogram and ultrasound show a 1.7 x 2.3 x 1.4 cm mass in the left breast at the 10:00 position, 3 cm from the nipple. Image guided biopsy shows invasive ductal carcinoma, ER 100%, PR 13%, Ki-67 14%, HER-2 negative.  MRI is scheduled for tomorrow, November 15.  She is here with her cousin to discuss management. She is interested in breast conservation, if we feel that will be appropriate therapy.  Family history is positive for breast cancer in a first cousin and aggressive prostate cancer in her father.  Comorbidities include past history of alcohol abuse, hypertension, iron deficiency anemia requiring Aranesp injections by Dr. Welton Flakes, TAH, BSO and appendectomy. Chronic kidney disease. HPI  Past Medical History  Diagnosis Date  . Anemia of chronic disease   . Anxiety   . Hypertension   . Hepatitis   . Lipid disorder   . Breast cancer 11/13    IDC left breast  . Breast cancer 09/01/2012    Past Surgical History  Procedure Date  . Abdominal hysterectomy   . Appendectomy     Family History  Problem Relation Age of Onset  . Cancer Father   . Breast cancer Cousin   . Tuberculosis Mother   . Prostate cancer Father     Social History History  Substance Use Topics  . Smoking status: Former Games developer  . Smokeless tobacco: Not on file  . Alcohol Use: 0.6 oz/week    1 Glasses of wine per week    No Known Allergies  Current Outpatient Prescriptions  Medication Sig Dispense Refill  . allopurinol (ZYLOPRIM) 100 MG tablet Take  100 mg by mouth daily.       . Amlodipine-Valsartan-HCTZ (EXFORGE HCT) 5-160-12.5 MG TABS Take 1 tablet by mouth daily.      [name] FeFum-FePoly-FA-B Cmp-C-Biot (INTEGRA PLUS PO) Take 1 tablet by mouth daily.      . folic acid (FOLVITE) 400 MCG tablet Take 400-800 mcg by mouth daily.      . Omega-3 Fatty Acids (FISH OIL) 500 MG CAPS Take 1 capsule by mouth daily.      . vitamin C (ASCORBIC ACID) 500 MG tablet Take 500 mg by mouth daily.      . diclofenac sodium (VOLTAREN) 1 % GEL Apply 2 g topically 3 (three) times daily as needed. For arthritis pain        Review of Systems Review of Systems  Constitutional: Positive for fatigue. Negative for fever, chills and unexpected weight change.  HENT: Negative for hearing loss, congestion, sore throat, trouble swallowing and voice change.   Eyes: Negative for visual disturbance.  Respiratory: Negative for cough and wheezing.   Cardiovascular: Negative for chest pain, palpitations and leg swelling.  Gastrointestinal: Negative for nausea, vomiting, abdominal pain, diarrhea, constipation, blood in stool, abdominal distention and anal bleeding.  Genitourinary: Negative for hematuria, vaginal bleeding and difficulty urinating.  Musculoskeletal: Negative for arthralgias.  Skin: Negative for rash and wound.  Neurological: Negative for seizures, syncope  and headaches.  Hematological: Negative for adenopathy. Does not bruise/bleed easily.  Psychiatric/Behavioral: Negative for confusion.    Blood pressure 130/74, pulse 84, temperature 97.9 F (36.6 C), resp. rate 12, height 5\' 2"  (1.575 m), weight 133 lb 12.8 oz (60.691 kg).  Physical Exam Physical Exam  Constitutional: She is oriented to person, place, and time. She appears well-developed and well-nourished. No distress.  HENT:  Head: Normocephalic and atraumatic.  Nose: Nose normal.  Mouth/Throat: No oropharyngeal exudate.  Eyes: Conjunctivae normal and EOM are normal. Pupils are equal, round, and  reactive to light. Left eye exhibits no discharge. No scleral icterus.  Neck: Neck supple. No JVD present. No tracheal deviation present. No thyromegaly present.  Cardiovascular: Normal rate, regular rhythm, normal heart sounds and intact distal pulses.   No murmur heard. Pulmonary/Chest: Effort normal and breath sounds normal. No respiratory distress. She has no wheezes. She has no rales. She exhibits no tenderness.         Breasts are medium size. Palpable mass left breast at 11:00 position above the areolar margin. Minimal ecchymoses, no other skin changes. Mass is mobile not fixed to any surrounding structures. No other masses in either breast. No palpable axillary adenopathy. Nipple areolar complexes looked normal.  Abdominal: Soft. Bowel sounds are normal. She exhibits no distension and no mass. There is no tenderness. There is no rebound and no guarding.       Hysterectomy scar  Musculoskeletal: She exhibits no edema and no tenderness.  Lymphadenopathy:    She has no cervical adenopathy.  Neurological: She is alert and oriented to person, place, and time. She exhibits normal muscle tone. Coordination normal.  Skin: Skin is warm and dry. No rash noted. She is not diaphoretic. No erythema. No pallor.  Psychiatric: She has a normal mood and affect. Her behavior is normal. Judgment and thought content normal.       Reluctant to make eye contact, seems a little shy.    Data Reviewed Imaging studies. Pathology report. Conversation with Dr. Welton Flakes yesterday. Notes from Cancer center.  Assessment    Invasive ductal carcinoma left breast, upper inner quadrant, palpable, 2.3 cm, ER 100%, PR 13%, HER-2-negative, Ki-67 14% clinical stage T2, N0  MRI pending  Assuming this is a solitary finding, she is a candidate for lumpectomy and sentinel node biopsy.  Hypertension  Chronic anemia  Remote history of alcohol abuse  Chronic kidney disease  History TAH, BSO, appendectomy  Family  history of breast cancer in a first cousin.    Plan    MRI is scheduled for tomorrow.  We will tentatively schedule her for left partial mastectomy with needle localization and left axillary sentinel node biopsy. She is aware that if the MRI shows other areas of concern, we will need to stop and reassess and possibly  biopsy second areas. However, if the MRI shows this to be a solitary finding we'll proceed with definitive surgery as the next step in her treatment plan  She is aware that she will need radiation therapy postop and probably antiestrogen therapy and that these decisions will be made postop  I discussed the indications, details, techniques, and numerous risks of the surgery with the patient and her cousin. She understands these issues. Her questions were answered. She agrees with this plan.  ADDENDUM (09/04/2012):  MRI shows 2.0 X 1.9 X 1.8 cm. IDC left breast. This is a solitary finding. Will plan to proceed with breast conservation surgery.  Angelia Mould. Derrell Lolling, M.D., [facility] General and Minimally invasive Surgery Breast and Colorectal Surgery Office:   [phone] Pager:   [phone]  09/03/2012, 11:23 AM

## 2012-09-04 ENCOUNTER — Ambulatory Visit
Admission: RE | Admit: 2012-09-04 | Discharge: 2012-09-04 | Disposition: A | Discharge status: Home / Self Care | Payer: PRIVATE HEALTH INSURANCE | Source: Ambulatory Visit | Attending: Oncology | Admitting: Oncology

## 2012-09-04 ENCOUNTER — Telehealth (INDEPENDENT_AMBULATORY_CARE_PROVIDER_SITE_OTHER): Payer: Self-pay | Admitting: General Surgery

## 2012-09-04 ENCOUNTER — Ambulatory Visit: Payer: PRIVATE HEALTH INSURANCE

## 2012-09-04 DIAGNOSIS — C50919 Malignant neoplasm of unspecified site of unspecified female breast: Secondary | ICD-10-CM

## 2012-09-04 MED ORDER — GADOBENATE DIMEGLUMINE 529 MG/ML IV SOLN
12.0000 mL | Freq: Once | INTRAVENOUS | Status: AC | PRN
  Administered 2012-09-04: 12 mL via INTRAVENOUS

## 2012-09-04 NOTE — Telephone Encounter (Signed)
Discussed MRI report with patient. Her known cancer is a solitary finding. She is a candidate for breast conservation  Surgery. Scheduled for Dec. 19.   Alyssamarie Mounsey M. Derrell Lolling, M.D., [facility] General and Minimally invasive Surgery Breast and Colorectal Surgery Office:   [phone] Pager:   [phone]

## 2012-09-07 ENCOUNTER — Other Ambulatory Visit: Payer: Self-pay | Admitting: Internal Medicine

## 2012-09-08 ENCOUNTER — Telehealth: Payer: Self-pay | Admitting: Oncology

## 2012-09-08 ENCOUNTER — Telehealth: Payer: Self-pay | Admitting: Medical Oncology

## 2012-09-08 NOTE — Telephone Encounter (Signed)
Per MD patient to be seen by NP for eval. Notified pt to come in to be seen tomorrow @ 1:30 for Lab and 2:00pm for provider. Patient verbalized understanding. Onc-tx sent.

## 2012-09-08 NOTE — Telephone Encounter (Signed)
Pt called in requesting to see the md as ap due to she is feeling weak and tired. gve the message to the desk nurse. Advised the pt someone will be calling her back.

## 2012-09-08 NOTE — Telephone Encounter (Signed)
Patient can come see LA today

## 2012-09-08 NOTE — Telephone Encounter (Signed)
Patient called this morning spoke with scheduling, states " I need to see someone this week due to feeling tired, weak, maybe low iron." Patient last labs for ferriten 70, iron 52 on 07/09/12. Last aranesp on 07/09/12. Next follow-up with MD and Lab 10/01/12.  Will review with MD.

## 2012-09-09 ENCOUNTER — Other Ambulatory Visit (HOSPITAL_BASED_OUTPATIENT_CLINIC_OR_DEPARTMENT_OTHER): Payer: PRIVATE HEALTH INSURANCE | Admitting: Lab

## 2012-09-09 ENCOUNTER — Ambulatory Visit (HOSPITAL_BASED_OUTPATIENT_CLINIC_OR_DEPARTMENT_OTHER): Payer: PRIVATE HEALTH INSURANCE | Admitting: Adult Health

## 2012-09-09 ENCOUNTER — Encounter: Payer: Self-pay | Admitting: Adult Health

## 2012-09-09 ENCOUNTER — Telehealth: Payer: Self-pay | Admitting: *Deleted

## 2012-09-09 VITALS — BP 144/72 | HR 80 | Temp 97.5°F | Resp 20 | Ht 62.0 in | Wt 133.0 lb

## 2012-09-09 DIAGNOSIS — N289 Disorder of kidney and ureter, unspecified: Secondary | ICD-10-CM

## 2012-09-09 DIAGNOSIS — D638 Anemia in other chronic diseases classified elsewhere: Secondary | ICD-10-CM

## 2012-09-09 DIAGNOSIS — D509 Iron deficiency anemia, unspecified: Secondary | ICD-10-CM

## 2012-09-09 LAB — CBC WITH DIFFERENTIAL/PLATELET
BASO%: 0.6 % (ref 0.0–2.0)
Basophils Absolute: 0 10*3/uL (ref 0.0–0.1)
Eosinophils Absolute: 0.1 10*3/uL (ref 0.0–0.5)
HCT: 32.9 % — ABNORMAL LOW (ref 34.8–46.6)
HGB: 11 g/dL — ABNORMAL LOW (ref 11.6–15.9)
LYMPH%: 33.1 % (ref 14.0–49.7)
MCHC: 33.4 g/dL (ref 31.5–36.0)
MONO#: 0.6 10*3/uL (ref 0.1–0.9)
NEUT#: 2.6 10*3/uL (ref 1.5–6.5)
NEUT%: 51.2 % (ref 38.4–76.8)
Platelets: 231 10*3/uL (ref 145–400)
WBC: 5.2 10*3/uL (ref 3.9–10.3)
lymph#: 1.7 10*3/uL (ref 0.9–3.3)

## 2012-09-09 NOTE — Patient Instructions (Addendum)
Please return tomorrow for you lab only appt to see how your iron levels are doing.  We will call you if you need to come in for iron.

## 2012-09-09 NOTE — Progress Notes (Signed)
OFFICE PROGRESS NOTE  CC Dr. Renaye Rakers  DIAGNOSIS: Anemia of chronic disease, renal insufficiency and iron deficiency.  PRIOR THERAPY: #1 Aranesp injections every 4 weeks and iron as indicated.   #2 mammogram and ultrasound guided core biopsy of the left breast  CURRENT THERAPY: Aranesp injections every 4 weeks and iron as indicated.   INTERVAL HISTORY:  [name] came in today as an add on due to fatigue.  She was cleaning heavily this past Monday and became extremely tired.  She feels like her iron is low.  This feeling is slowly improving.  Otherwise she is without any questions/complaints/concerns, and has no other signs or symptoms.    ALLERGIES:  None  ROS:  General: fatigue (+), night sweats (-), fever (-), pain (-) Lymph: palpable nodes (-) HEENT: vision changes (-), mucositis (-), gum bleeding (-), epistaxis (-) Cardiovascular: chest pain (-), palpitations (-) Pulmonary: shortness of breath (-), dyspnea on exertion (-), cough (-), hemoptysis (-) GI:  Early satiety (-), melena (-), dysphagia (-), nausea/vomiting (-), diarrhea (-) GU: dysuria (-), hematuria (-), incontinence (-) Musculoskeletal: joint swelling (-), joint pain (-), back pain (-) Neuro: weakness (-), numbness (-), headache (-), confusion (-) Skin: Rash (-), lesions (-), dryness (-) Psych: depression (-), suicidal/homicidal ideation (-), feeling of hopelessness (-)   PHYSICAL EXAMINATION:  BP 144/72  Pulse 80  Temp 97.5 F (36.4 C) (Oral)  Resp 20  Ht 5\' 2"  (1.575 m)  Wt 133 lb (60.328 kg)  BMI 24.33 kg/m2. General: Well developed, well nourished, in no acute distress.  EENT: No ocular or oral lesions. No stomatitis.  Respiratory: Lungs are clear to auscultation bilaterally with normal respiratory movement and no accessory muscle use. Cardiac: No murmur, rub or tachycardia. No upper or lower extremity edema.  GI: Abdomen is soft, no palpable hepatosplenomegaly. No fluid wave. No  tenderness. Musculoskeletal: No kyphosis, no tenderness over the spine, ribs or hips. Lymph: No cervical, infraclavicular, axillary or inguinal adenopathy. Neuro: No focal neurological deficits. Psych: Alert and oriented X 3, appropriate mood and affect.  ECOG PERFORMANCE STATUS: 0 - Asymptomatic    LABORATORY DATA: Lab Results  Component Value Date   WBC 5.2 09/09/2012   HGB 11.0* 09/09/2012   HCT 32.9* 09/09/2012   MCV 96.2 09/09/2012   PLT 231 09/09/2012      Chemistry      Component Value Date/Time   NA 140 06/16/2012 1050   NA 138 04/12/2010 0954   K 4.2 06/16/2012 1050   K 4.8* 04/12/2010 0954   CL 104 06/16/2012 1050   CL 101 04/12/2010 0954   CO2 28 06/16/2012 1050   CO2 26 04/12/2010 0954   BUN 17 06/16/2012 1050   BUN 14 04/12/2010 0954   CREATININE 1.38* 06/16/2012 1050   CREATININE 1.2 04/12/2010 0954      Component Value Date/Time   CALCIUM 10.2 06/16/2012 1050   CALCIUM 9.8 04/12/2010 0954   ALKPHOS 100 06/07/2012 1042   ALKPHOS 106* 04/12/2010 0954   AST 69* 06/07/2012 1042   AST 80* 04/12/2010 0954   ALT 46* 06/07/2012 1042   BILITOT 0.2* 06/07/2012 1042   BILITOT 0.50 04/12/2010 0954      ASSESSMENT: 78 year old female with  #1 history of anemia of chronic disease and iron deficiency patient has been receiving Aranesp injections.  PLAN:  #1 [name] is doing essentially well today.  We will wait for her iron studies to come back and give her iron should she need  it.  I am optimistic since her fatigue is improving on its own.     All questions were answered. The patient knows to call the clinic with any problems, questions or concerns. The length of time of the face-to-face encounter was 30  minutes. More than 50% of time was spent counseling and coordination of care.

## 2012-09-09 NOTE — Telephone Encounter (Signed)
Gave patient appointment for 09-10-2012 lab only

## 2012-09-10 ENCOUNTER — Other Ambulatory Visit: Payer: PRIVATE HEALTH INSURANCE | Admitting: Lab

## 2012-09-15 ENCOUNTER — Other Ambulatory Visit: Payer: PRIVATE HEALTH INSURANCE | Admitting: Lab

## 2012-10-01 ENCOUNTER — Ambulatory Visit (HOSPITAL_BASED_OUTPATIENT_CLINIC_OR_DEPARTMENT_OTHER): Payer: PRIVATE HEALTH INSURANCE | Admitting: Oncology

## 2012-10-01 ENCOUNTER — Encounter: Payer: Self-pay | Admitting: Oncology

## 2012-10-01 ENCOUNTER — Other Ambulatory Visit (HOSPITAL_BASED_OUTPATIENT_CLINIC_OR_DEPARTMENT_OTHER): Payer: PRIVATE HEALTH INSURANCE | Admitting: Lab

## 2012-10-01 ENCOUNTER — Ambulatory Visit: Payer: PRIVATE HEALTH INSURANCE

## 2012-10-01 VITALS — BP 124/61 | HR 83 | Temp 98.9°F | Resp 20 | Ht 62.0 in | Wt 134.7 lb

## 2012-10-01 DIAGNOSIS — C50219 Malignant neoplasm of upper-inner quadrant of unspecified female breast: Secondary | ICD-10-CM

## 2012-10-01 DIAGNOSIS — D638 Anemia in other chronic diseases classified elsewhere: Secondary | ICD-10-CM

## 2012-10-01 DIAGNOSIS — D649 Anemia, unspecified: Secondary | ICD-10-CM

## 2012-10-01 DIAGNOSIS — Z170 Estrogen receptor positive status [ER+]: Secondary | ICD-10-CM

## 2012-10-01 DIAGNOSIS — D509 Iron deficiency anemia, unspecified: Secondary | ICD-10-CM

## 2012-10-01 DIAGNOSIS — C50919 Malignant neoplasm of unspecified site of unspecified female breast: Secondary | ICD-10-CM

## 2012-10-01 LAB — CBC WITH DIFFERENTIAL/PLATELET
BASO%: 0.9 % (ref 0.0–2.0)
EOS%: 4.2 % (ref 0.0–7.0)
Eosinophils Absolute: 0.2 10*3/uL (ref 0.0–0.5)
HGB: 12.1 g/dL (ref 11.6–15.9)
MONO#: 0.5 10*3/uL (ref 0.1–0.9)
MONO%: 10.3 % (ref 0.0–14.0)
NEUT#: 2.9 10*3/uL (ref 1.5–6.5)
NEUT%: 62 % (ref 38.4–76.8)
Platelets: 221 10*3/uL (ref 145–400)
RBC: 3.49 10*6/uL — ABNORMAL LOW (ref 3.70–5.45)
WBC: 4.6 10*3/uL (ref 3.9–10.3)

## 2012-10-01 LAB — IRON AND TIBC
%SAT: 18 % — ABNORMAL LOW (ref 20–55)
TIBC: 427 ug/dL (ref 250–470)
UIBC: 352 ug/dL (ref 125–400)

## 2012-10-01 NOTE — Patient Instructions (Addendum)
Proceed with surgery as scheduled  I will see you back as scheduled in January 2014

## 2012-10-01 NOTE — Progress Notes (Signed)
OFFICE PROGRESS NOTE  CC Dr. Renaye Rakers  DIAGNOSIS: Anemia of chronic disease, renal insufficiency and iron deficiency.  PRIOR THERAPY: #1 Aranesp injections every 4 weeks and iron as indicated.   #2 mammogram and ultrasound guided core biopsy of the left breast. Prognostic markers showed invasive ductal carcinoma grade 1 or 2 ER positive PR positive HER-2/neu negative Ki-67 around 14%.  CURRENT THERAPY: Aranesp injections every 4 weeks and iron as indicated.   INTERVAL HISTORY:  Patient returns in followup for CBC check and possibly Aranesp injection. Overall she's doing well. She is scheduled for her breast surgery on Thursday of next week which is December 19. She is quite well she denies any nausea vomiting fevers chills night sweats headaches shortness of breath chest pains palpitations. Remainder of the 10 point review of systems is negative. ALLERGIES:  None  ROS:  General: fatigue (+), night sweats (-), fever (-), pain (-) Lymph: palpable nodes (-) HEENT: vision changes (-), mucositis (-), gum bleeding (-), epistaxis (-) Cardiovascular: chest pain (-), palpitations (-) Pulmonary: shortness of breath (-), dyspnea on exertion (-), cough (-), hemoptysis (-) GI:  Early satiety (-), melena (-), dysphagia (-), nausea/vomiting (-), diarrhea (-) GU: dysuria (-), hematuria (-), incontinence (-) Musculoskeletal: joint swelling (-), joint pain (-), back pain (-) Neuro: weakness (-), numbness (-), headache (-), confusion (-) Skin: Rash (-), lesions (-), dryness (-) Psych: depression (-), suicidal/homicidal ideation (-), feeling of hopelessness (-)   PHYSICAL EXAMINATION:  BP 124/61  Pulse 83  Temp 98.9 F (37.2 C) (Oral)  Resp 20  Ht 5\' 2"  (1.575 m)  Wt 134 lb 11.2 oz (61.1 kg)  BMI 24.64 kg/m2. General: Well developed, well nourished, in no acute distress.  EENT: No ocular or oral lesions. No stomatitis.  Respiratory: Lungs are clear to auscultation bilaterally with normal  respiratory movement and no accessory muscle use. Cardiac: No murmur, rub or tachycardia. No upper or lower extremity edema.  GI: Abdomen is soft, no palpable hepatosplenomegaly. No fluid wave. No tenderness. Musculoskeletal: No kyphosis, no tenderness over the spine, ribs or hips. Lymph: No cervical, infraclavicular, axillary or inguinal adenopathy. Neuro: No focal neurological deficits. Psych: Alert and oriented X 3, appropriate mood and affect.  ECOG PERFORMANCE STATUS: 0 - Asymptomatic    LABORATORY DATA: Lab Results  Component Value Date   WBC 4.6 10/01/2012   HGB 12.1 10/01/2012   HCT 34.6* 10/01/2012   MCV 99.2 10/01/2012   PLT 221 10/01/2012      Chemistry      Component Value Date/Time   NA 140 06/16/2012 1050   NA 138 04/12/2010 0954   K 4.2 06/16/2012 1050   K 4.8* 04/12/2010 0954   CL 104 06/16/2012 1050   CL 101 04/12/2010 0954   CO2 28 06/16/2012 1050   CO2 26 04/12/2010 0954   BUN 17 06/16/2012 1050   BUN 14 04/12/2010 0954   CREATININE 1.38* 06/16/2012 1050   CREATININE 1.2 04/12/2010 0954      Component Value Date/Time   CALCIUM 10.2 06/16/2012 1050   CALCIUM 9.8 04/12/2010 0954   ALKPHOS 100 06/07/2012 1042   ALKPHOS 106* 04/12/2010 0954   AST 69* 06/07/2012 1042   AST 80* 04/12/2010 0954   ALT 46* 06/07/2012 1042   BILITOT 0.2* 06/07/2012 1042   BILITOT 0.50 04/12/2010 0954      ASSESSMENT: 78 year old female with  #1 history of anemia of chronic disease and iron deficiency patient has been receiving Aranesp injections.  #2  new diagnosis of breast cancer in the left breast that is ER positive HER-2/neu negative. Awaiting lumpectomy.  PLAN:  #1 patient will proceed with her scheduled surgery next week.  #2 her H&H looks good and therefore she will not need any Aranesp.  #3 she is scheduled to see me in followup in January 2014.  All questions were answered. The patient knows to call the clinic with any problems, questions or concerns. The length of time of  the face-to-face encounter was 30  minutes. More than 50% of time was spent counseling and coordination of care.   Drue Second, MD Medical/Oncology [facility] [phone] (beeper) [phone] (Office)  10/01/2012, 10:31 AM

## 2012-10-06 ENCOUNTER — Encounter (HOSPITAL_BASED_OUTPATIENT_CLINIC_OR_DEPARTMENT_OTHER): Payer: Self-pay | Admitting: *Deleted

## 2012-10-06 NOTE — Progress Notes (Signed)
To come in for labs and ua Had cardiac work up 8/13 when she had a syncope episode-may have been related to alcohol with meds-she has not had any since.

## 2012-10-06 NOTE — H&P (Signed)
[name]     MRN: [id number]   Description: 78 year old female  Provider: Ernestene Mention, MD  Department: [facility]       Diagnoses     Breast cancer   - Primary    174.9      Vitals    BP Pulse Temp Resp Ht Wt    130/74 84 97.9 F (36.6 C) 12 5\' 2"  (1.575 m) 133 lb 12.8 oz (60.691 kg)   BMI - 24.47 kg/m2                 History and Physical     Ernestene Mention, MD   Patient ID: [name], female   DOB: 08-03-1932, 78 y.o.   MRN: [id number]            HPI [name] is a 78 y.o. female.  She is referred by Dr. Drue Second and by Dr. Juel Burrow at the [facility] for evaluation of a newly diagnosed invasive ductal carcinoma, left breast, upper inner quadrant.   This patient states she has never had a mammogram. She felt a lump in the left breast about 2 weeks ago and went for imaging studies. Mammogram and ultrasound show a 1.7 x 2.3 x 1.4 cm mass in the left breast at the 10:00 position, 3 cm from the nipple. Image guided biopsy shows invasive ductal carcinoma, ER 100%, PR 13%, Ki-67 14%, HER-2 negative.   MRI is scheduled for tomorrow, November 15.   She is here with her cousin to discuss management. She is interested in breast conservation, if we feel that will be appropriate therapy.   Family history is positive for breast cancer in a first cousin and aggressive prostate cancer in her father.   Comorbidities include past history of alcohol abuse, hypertension, iron deficiency anemia requiring Aranesp injections by Dr. Welton Flakes, TAH, BSO and appendectomy. Chronic kidney disease.       Past Medical History   Diagnosis  Date   .  Anemia of chronic disease     .  Anxiety     .  Hypertension     .  Hepatitis     .  Lipid disorder     .  Breast cancer  11/13       IDC left breast   .  Breast cancer  09/01/2012       Past Surgical History   Procedure  Date   .  Abdominal hysterectomy     .  Appendectomy         Family  History   Problem  Relation  Age of Onset   .  Cancer  Father     .  Breast cancer  Cousin     .  Tuberculosis  Mother     .  Prostate cancer  Father        Social History History   Substance Use Topics   .  Smoking status:  Former Games developer   .  Smokeless tobacco:  Not on file   .  Alcohol Use:  0.6 oz/week       1 Glasses of wine per week      No Known Allergies    Current Outpatient Prescriptions   Medication  Sig  Dispense  Refill   .  allopurinol (ZYLOPRIM) 100 MG tablet  Take 100 mg by mouth daily.          .  Amlodipine-Valsartan-HCTZ (EXFORGE HCT)  5-160-12.5 MG TABS  Take 1 tablet by mouth daily.         [name]  FeFum-FePoly-FA-B Cmp-C-Biot (INTEGRA PLUS PO)  Take 1 tablet by mouth daily.         .  folic acid (FOLVITE) 400 MCG tablet  Take 400-800 mcg by mouth daily.         .  Omega-3 Fatty Acids (FISH OIL) 500 MG CAPS  Take 1 capsule by mouth daily.         .  vitamin C (ASCORBIC ACID) 500 MG tablet  Take 500 mg by mouth daily.         .  diclofenac sodium (VOLTAREN) 1 % GEL  Apply 2 g topically 3 (three) times daily as needed. For arthritis pain            Review of Systems  Constitutional: Positive for fatigue. Negative for fever, chills and unexpected weight change.  HENT: Negative for hearing loss, congestion, sore throat, trouble swallowing and voice change.   Eyes: Negative for visual disturbance.  Respiratory: Negative for cough and wheezing.   Cardiovascular: Negative for chest pain, palpitations and leg swelling.  Gastrointestinal: Negative for nausea, vomiting, abdominal pain, diarrhea, constipation, blood in stool, abdominal distention and anal bleeding.  Genitourinary: Negative for hematuria, vaginal bleeding and difficulty urinating.  Musculoskeletal: Negative for arthralgias.  Skin: Negative for rash and wound.  Neurological: Negative for seizures, syncope and headaches.  Hematological: Negative for adenopathy. Does not bruise/bleed easily.   Psychiatric/Behavioral: Negative for confusion.    Blood pressure 130/74, pulse 84, temperature 97.9 F (36.6 C), resp. rate 12, height 5\' 2"  (1.575 m), weight 133 lb 12.8 oz (60.691 kg).   Physical Exam   Constitutional: She is oriented to person, place, and time. She appears well-developed and well-nourished. No distress.  HENT:   Head: Normocephalic and atraumatic.   Nose: Nose normal.   Mouth/Throat: No oropharyngeal exudate.  Eyes: Conjunctivae normal and EOM are normal. Pupils are equal, round, and reactive to light. Left eye exhibits no discharge. No scleral icterus.  Neck: Neck supple. No JVD present. No tracheal deviation present. No thyromegaly present.  Cardiovascular: Normal rate, regular rhythm, normal heart sounds and intact distal pulses.    No murmur heard. Pulmonary/Chest: Effort normal and breath sounds normal. No respiratory distress. She has no wheezes. She has no rales. She exhibits no tenderness.         Breasts are medium size. Palpable mass left breast at 11:00 position above the areolar margin. Minimal ecchymoses, no other skin changes. Mass is mobile not fixed to any surrounding structures. No other masses in either breast. No palpable axillary adenopathy. Nipple areolar complexes looked normal.  Abdominal: Soft. Bowel sounds are normal. She exhibits no distension and no mass. There is no tenderness. There is no rebound and no guarding.       Hysterectomy scar  Musculoskeletal: She exhibits no edema and no tenderness.  Lymphadenopathy:    She has no cervical adenopathy.  Neurological: She is alert and oriented to person, place, and time. She exhibits normal muscle tone. Coordination normal.  Skin: Skin is warm and dry. No rash noted. She is not diaphoretic. No erythema. No pallor.  Psychiatric: She has a normal mood and affect. Her behavior is normal. Judgment and thought content normal.       Reluctant to make eye contact, seems a little shy.    Data  Reviewed Imaging studies. Pathology report. Conversation with Dr. Welton Flakes  yesterday. Notes from Cancer center.   Assessment Invasive ductal carcinoma left breast, upper inner quadrant, palpable, 2.3 cm, ER 100%, PR 13%, HER-2-negative, Ki-67 14% clinical stage T2, N0   MRI pending   Assuming this is a solitary finding, she is a candidate for lumpectomy and sentinel node biopsy.   Hypertension   Chronic anemia   Remote history of alcohol abuse   Chronic kidney disease   History TAH, BSO, appendectomy   Family history of breast cancer in a first cousin.   Plan MRI is scheduled .   We will tentatively schedule her for left partial mastectomy with needle localization and left axillary sentinel node biopsy. She is aware that if the MRI shows other areas of concern, we will need to stop and reassess and possibly  biopsy second areas. However, if the MRI shows this to be a solitary finding we'll proceed with definitive surgery as the next step in her treatment plan   She is aware that she will need radiation therapy postop and probably antiestrogen therapy and that these decisions will be made postop   I discussed the indications, details, techniques, and numerous risks of the surgery with the patient and her cousin. She understands these issues. Her questions were answered. She agrees with this plan.   ADDENDUM (09/04/2012):  MRI shows 2.0 X 1.9 X 1.8 cm. IDC left breast. This is a solitary finding. Will plan to proceed with breast conservation surgery.       Angelia Mould. Derrell Lolling, M.D., [facility] General and Minimally invasive Surgery Breast and Colorectal Surgery Office:   [phone] Pager:   [phone]

## 2012-10-07 ENCOUNTER — Encounter (HOSPITAL_BASED_OUTPATIENT_CLINIC_OR_DEPARTMENT_OTHER)
Admission: RE | Admit: 2012-10-07 | Discharge: 2012-10-07 | Disposition: A | Discharge status: Home / Self Care | Payer: PRIVATE HEALTH INSURANCE | Source: Ambulatory Visit | Attending: General Surgery | Admitting: General Surgery

## 2012-10-07 LAB — URINALYSIS, ROUTINE W REFLEX MICROSCOPIC
Nitrite: NEGATIVE
Specific Gravity, Urine: 1.026 (ref 1.005–1.030)
Urobilinogen, UA: 1 mg/dL (ref 0.0–1.0)
pH: 5.5 (ref 5.0–8.0)

## 2012-10-07 LAB — CBC WITH DIFFERENTIAL/PLATELET
Eosinophils Absolute: 0.2 10*3/uL (ref 0.0–0.7)
Eosinophils Relative: 4 % (ref 0–5)
HCT: 35.7 % — ABNORMAL LOW (ref 36.0–46.0)
Lymphocytes Relative: 31 % (ref 12–46)
Lymphs Abs: 1.7 10*3/uL (ref 0.7–4.0)
MCH: 32.4 pg (ref 26.0–34.0)
MCV: 95.7 fL (ref 78.0–100.0)
Monocytes Absolute: 0.5 10*3/uL (ref 0.1–1.0)
Platelets: 246 10*3/uL (ref 150–400)
RDW: 13.6 % (ref 11.5–15.5)
WBC: 5.4 10*3/uL (ref 4.0–10.5)

## 2012-10-07 LAB — COMPREHENSIVE METABOLIC PANEL
CO2: 26 mEq/L (ref 19–32)
Calcium: 10.3 mg/dL (ref 8.4–10.5)
Creatinine, Ser: 1.36 mg/dL — ABNORMAL HIGH (ref 0.50–1.10)
GFR calc Af Amer: 42 mL/min — ABNORMAL LOW (ref >90)
GFR calc non Af Amer: 36 mL/min — ABNORMAL LOW (ref >90)
Glucose, Bld: 81 mg/dL (ref 70–99)
Sodium: 140 mEq/L (ref 135–145)
Total Protein: 8 g/dL (ref 6.0–8.3)

## 2012-10-07 LAB — URINE MICROSCOPIC-ADD ON

## 2012-10-08 ENCOUNTER — Encounter (HOSPITAL_COMMUNITY)
Admission: RE | Admit: 2012-10-08 | Discharge: 2012-10-08 | Disposition: A | Discharge status: Home / Self Care | Payer: PRIVATE HEALTH INSURANCE | Source: Ambulatory Visit | Attending: General Surgery | Admitting: General Surgery

## 2012-10-08 ENCOUNTER — Ambulatory Visit
Admission: RE | Admit: 2012-10-08 | Discharge: 2012-10-08 | Disposition: A | Discharge status: Home / Self Care | Payer: PRIVATE HEALTH INSURANCE | Source: Ambulatory Visit | Attending: General Surgery | Admitting: General Surgery

## 2012-10-08 ENCOUNTER — Ambulatory Visit (HOSPITAL_BASED_OUTPATIENT_CLINIC_OR_DEPARTMENT_OTHER): Payer: PRIVATE HEALTH INSURANCE | Admitting: Anesthesiology

## 2012-10-08 ENCOUNTER — Ambulatory Visit (HOSPITAL_BASED_OUTPATIENT_CLINIC_OR_DEPARTMENT_OTHER)
Admission: RE | Admit: 2012-10-08 | Discharge: 2012-10-08 | Disposition: A | Discharge status: Home / Self Care | Payer: PRIVATE HEALTH INSURANCE | Source: Ambulatory Visit | Attending: General Surgery | Admitting: General Surgery

## 2012-10-08 ENCOUNTER — Other Ambulatory Visit (INDEPENDENT_AMBULATORY_CARE_PROVIDER_SITE_OTHER): Payer: Self-pay | Admitting: General Surgery

## 2012-10-08 ENCOUNTER — Encounter (HOSPITAL_BASED_OUTPATIENT_CLINIC_OR_DEPARTMENT_OTHER): Payer: Self-pay | Admitting: *Deleted

## 2012-10-08 ENCOUNTER — Encounter (HOSPITAL_BASED_OUTPATIENT_CLINIC_OR_DEPARTMENT_OTHER)
Admission: RE | Disposition: A | Discharge status: Home / Self Care | Payer: Self-pay | Source: Ambulatory Visit | Attending: General Surgery

## 2012-10-08 ENCOUNTER — Encounter (HOSPITAL_BASED_OUTPATIENT_CLINIC_OR_DEPARTMENT_OTHER): Payer: Self-pay | Admitting: Anesthesiology

## 2012-10-08 DIAGNOSIS — C50919 Malignant neoplasm of unspecified site of unspecified female breast: Secondary | ICD-10-CM | POA: Diagnosis present

## 2012-10-08 DIAGNOSIS — Z01812 Encounter for preprocedural laboratory examination: Secondary | ICD-10-CM | POA: Insufficient documentation

## 2012-10-08 DIAGNOSIS — Z803 Family history of malignant neoplasm of breast: Secondary | ICD-10-CM | POA: Insufficient documentation

## 2012-10-08 DIAGNOSIS — Z90710 Acquired absence of both cervix and uterus: Secondary | ICD-10-CM | POA: Insufficient documentation

## 2012-10-08 DIAGNOSIS — C50912 Malignant neoplasm of unspecified site of left female breast: Secondary | ICD-10-CM | POA: Diagnosis present

## 2012-10-08 DIAGNOSIS — Z9089 Acquired absence of other organs: Secondary | ICD-10-CM | POA: Insufficient documentation

## 2012-10-08 DIAGNOSIS — C50219 Malignant neoplasm of upper-inner quadrant of unspecified female breast: Secondary | ICD-10-CM | POA: Insufficient documentation

## 2012-10-08 DIAGNOSIS — I10 Essential (primary) hypertension: Secondary | ICD-10-CM | POA: Insufficient documentation

## 2012-10-08 HISTORY — DX: Presence of spectacles and contact lenses: Z97.3

## 2012-10-08 HISTORY — PX: PARTIAL MASTECTOMY WITH NEEDLE LOCALIZATION AND AXILLARY SENTINEL LYMPH NODE BX: SHX6009

## 2012-10-08 HISTORY — DX: Presence of dental prosthetic device (complete) (partial): Z97.2

## 2012-10-08 HISTORY — DX: Disorder of kidney and ureter, unspecified: N28.9

## 2012-10-08 SURGERY — PARTIAL MASTECTOMY WITH NEEDLE LOCALIZATION AND AXILLARY SENTINEL LYMPH NODE BX
Anesthesia: General | Site: Breast | Laterality: Left | Wound class: Clean

## 2012-10-08 MED ORDER — BUPIVACAINE-EPINEPHRINE 0.5% -1:200000 IJ SOLN
INTRAMUSCULAR | Status: DC | PRN
  Administered 2012-10-08: 16 mL

## 2012-10-08 MED ORDER — CHLORHEXIDINE GLUCONATE 4 % EX LIQD: 1.0000 "application " | Freq: Once | CUTANEOUS | Status: DC

## 2012-10-08 MED ORDER — OXYCODONE HCL 5 MG PO TABS
5.0000 mg | ORAL_TABLET | Freq: Once | ORAL | Status: AC | PRN
  Administered 2012-10-08: 5 mg via ORAL

## 2012-10-08 MED ORDER — ONDANSETRON HCL 4 MG/2ML IJ SOLN: 4.0000 mg | Freq: Four times a day (QID) | INTRAMUSCULAR | Status: DC | PRN

## 2012-10-08 MED ORDER — OXYCODONE HCL 5 MG PO TABS: 5.0000 mg | ORAL_TABLET | ORAL | Status: DC | PRN

## 2012-10-08 MED ORDER — ONDANSETRON HCL 4 MG/2ML IJ SOLN: 4.0000 mg | Freq: Once | INTRAMUSCULAR | Status: DC | PRN

## 2012-10-08 MED ORDER — OXYCODONE HCL 5 MG/5ML PO SOLN: 5.0000 mg | Freq: Once | ORAL | Status: AC | PRN

## 2012-10-08 MED ORDER — TECHNETIUM TC 99M SULFUR COLLOID FILTERED
1.0000 | Freq: Once | INTRAVENOUS | Status: AC | PRN
Start: 2012-10-08 — End: 2012-10-08
  Administered 2012-10-08: 1 via INTRADERMAL

## 2012-10-08 MED ORDER — LACTATED RINGERS IV SOLN
INTRAVENOUS | Status: DC
  Administered 2012-10-08 (×2): via INTRAVENOUS

## 2012-10-08 MED ORDER — FENTANYL CITRATE 0.05 MG/ML IJ SOLN
50.0000 ug | INTRAMUSCULAR | Status: DC | PRN
  Administered 2012-10-08: 50 ug via INTRAVENOUS

## 2012-10-08 MED ORDER — SODIUM CHLORIDE 0.9 % IV SOLN: 250.0000 mL | INTRAVENOUS | Status: DC | PRN

## 2012-10-08 MED ORDER — PROPOFOL 10 MG/ML IV BOLUS
INTRAVENOUS | Status: DC | PRN
  Administered 2012-10-08: 150 mg via INTRAVENOUS

## 2012-10-08 MED ORDER — ONDANSETRON HCL 4 MG/2ML IJ SOLN
INTRAMUSCULAR | Status: DC | PRN
  Administered 2012-10-08: 4 mg via INTRAVENOUS

## 2012-10-08 MED ORDER — HEPARIN SODIUM (PORCINE) 5000 UNIT/ML IJ SOLN
5000.0000 [IU] | Freq: Once | INTRAMUSCULAR | Status: AC
  Administered 2012-10-08: 5000 [IU] via SUBCUTANEOUS

## 2012-10-08 MED ORDER — SODIUM CHLORIDE 0.9 % IJ SOLN
INTRAMUSCULAR | Status: DC | PRN
  Administered 2012-10-08: 3 mL via INTRAVENOUS

## 2012-10-08 MED ORDER — METHYLENE BLUE 1 % INJ SOLN
INTRAMUSCULAR | Status: DC | PRN
  Administered 2012-10-08: 2 mL via SUBMUCOSAL

## 2012-10-08 MED ORDER — MIDAZOLAM HCL 2 MG/2ML IJ SOLN
1.0000 mg | INTRAMUSCULAR | Status: DC | PRN
  Administered 2012-10-08: 2 mg via INTRAVENOUS

## 2012-10-08 MED ORDER — SODIUM CHLORIDE 0.9 % IJ SOLN: 3.0000 mL | Freq: Two times a day (BID) | INTRAMUSCULAR | Status: DC

## 2012-10-08 MED ORDER — LIDOCAINE HCL (CARDIAC) 20 MG/ML IV SOLN
INTRAVENOUS | Status: DC | PRN
  Administered 2012-10-08: 50 mg via INTRAVENOUS

## 2012-10-08 MED ORDER — CEFAZOLIN SODIUM-DEXTROSE 2-3 GM-% IV SOLR
2.0000 g | INTRAVENOUS | Status: AC
  Administered 2012-10-08: 2 g via INTRAVENOUS

## 2012-10-08 MED ORDER — ACETAMINOPHEN 650 MG RE SUPP: 650.0000 mg | RECTAL | Status: DC | PRN

## 2012-10-08 MED ORDER — FENTANYL CITRATE 0.05 MG/ML IJ SOLN
INTRAMUSCULAR | Status: DC | PRN
  Administered 2012-10-08: 50 ug via INTRAVENOUS
  Administered 2012-10-08: 100 ug via INTRAVENOUS

## 2012-10-08 MED ORDER — HYDROMORPHONE HCL PF 1 MG/ML IJ SOLN: 0.2500 mg | INTRAMUSCULAR | Status: DC | PRN

## 2012-10-08 MED ORDER — DEXAMETHASONE SODIUM PHOSPHATE 4 MG/ML IJ SOLN
INTRAMUSCULAR | Status: DC | PRN
  Administered 2012-10-08: 8 mg via INTRAVENOUS

## 2012-10-08 MED ORDER — HYDROCODONE-ACETAMINOPHEN 5-325 MG PO TABS: 1.0000 | ORAL_TABLET | ORAL | Status: DC | PRN

## 2012-10-08 MED ORDER — SODIUM CHLORIDE 0.9 % IV SOLN: INTRAVENOUS | Status: DC

## 2012-10-08 MED ORDER — ACETAMINOPHEN 325 MG PO TABS: 650.0000 mg | ORAL_TABLET | ORAL | Status: DC | PRN

## 2012-10-08 MED ORDER — MORPHINE SULFATE 2 MG/ML IJ SOLN: 2.0000 mg | INTRAMUSCULAR | Status: DC | PRN

## 2012-10-08 MED ORDER — SODIUM CHLORIDE 0.9 % IJ SOLN: 3.0000 mL | INTRAMUSCULAR | Status: DC | PRN

## 2012-10-08 MED ORDER — MIDAZOLAM HCL 5 MG/5ML IJ SOLN
INTRAMUSCULAR | Status: DC | PRN
  Administered 2012-10-08: 1 mg via INTRAVENOUS

## 2012-10-08 SURGICAL SUPPLY — 65 items
ADH SKN CLS APL DERMABOND .7 (GAUZE/BANDAGES/DRESSINGS) ×2
APL SKNCLS STERI-STRIP NONHPOA (GAUZE/BANDAGES/DRESSINGS)
APPLIER CLIP 11 MED OPEN (CLIP) ×2
APR CLP MED 11 20 MLT OPN (CLIP) ×1
BANDAGE ELASTIC 6 VELCRO ST LF (GAUZE/BANDAGES/DRESSINGS) IMPLANT
BENZOIN TINCTURE PRP APPL 2/3 (GAUZE/BANDAGES/DRESSINGS) IMPLANT
BINDER BREAST LRG (GAUZE/BANDAGES/DRESSINGS) ×1 IMPLANT
BLADE HEX COATED 2.75 (ELECTRODE) ×2 IMPLANT
BLADE SURG 10 STRL SS (BLADE) IMPLANT
BLADE SURG 15 STRL LF DISP TIS (BLADE) ×2 IMPLANT
BLADE SURG 15 STRL SS (BLADE) ×4
CANISTER SUCTION 1200CC (MISCELLANEOUS) ×2 IMPLANT
CHLORAPREP W/TINT 26ML (MISCELLANEOUS) ×2 IMPLANT
CLIP APPLIE 11 MED OPEN (CLIP) ×1 IMPLANT
CLOTH BEACON ORANGE TIMEOUT ST (SAFETY) ×2 IMPLANT
COVER MAYO STAND STRL (DRAPES) ×2 IMPLANT
COVER PROBE W GEL 5X96 (DRAPES) ×2 IMPLANT
COVER TABLE BACK 60X90 (DRAPES) ×2 IMPLANT
DECANTER SPIKE VIAL GLASS SM (MISCELLANEOUS) IMPLANT
DERMABOND ADVANCED (GAUZE/BANDAGES/DRESSINGS) ×2
DERMABOND ADVANCED .7 DNX12 (GAUZE/BANDAGES/DRESSINGS) IMPLANT
DEVICE DUBIN W/COMP PLATE 8390 (MISCELLANEOUS) ×2 IMPLANT
DRAIN CHANNEL 19F RND (DRAIN) IMPLANT
DRAIN HEMOVAC 1/8 X 5 (WOUND CARE) IMPLANT
DRAPE LAPAROSCOPIC ABDOMINAL (DRAPES) ×2 IMPLANT
DRAPE UTILITY XL STRL (DRAPES) ×2 IMPLANT
DRSG PAD ABDOMINAL 8X10 ST (GAUZE/BANDAGES/DRESSINGS) ×2 IMPLANT
ELECT REM PT RETURN 9FT ADLT (ELECTROSURGICAL) ×2
ELECTRODE REM PT RTRN 9FT ADLT (ELECTROSURGICAL) ×1 IMPLANT
EVACUATOR SILICONE 100CC (DRAIN) IMPLANT
GAUZE SPONGE 4X4 12PLY STRL LF (GAUZE/BANDAGES/DRESSINGS) IMPLANT
GAUZE SPONGE 4X4 16PLY XRAY LF (GAUZE/BANDAGES/DRESSINGS) IMPLANT
GLOVE EUDERMIC 7 POWDERFREE (GLOVE) ×3 IMPLANT
GLOVE INDICATOR 7.0 STRL GRN (GLOVE) ×1 IMPLANT
GOWN PREVENTION PLUS XLARGE (GOWN DISPOSABLE) ×2 IMPLANT
GOWN PREVENTION PLUS XXLARGE (GOWN DISPOSABLE) ×2 IMPLANT
KIT MARKER MARGIN INK (KITS) ×2 IMPLANT
NDL HYPO 25X1 1.5 SAFETY (NEEDLE) ×2 IMPLANT
NDL SAFETY ECLIPSE 18X1.5 (NEEDLE) ×1 IMPLANT
NEEDLE HYPO 18GX1.5 SHARP (NEEDLE) ×2
NEEDLE HYPO 25X1 1.5 SAFETY (NEEDLE) ×4 IMPLANT
NS IRRIG 1000ML POUR BTL (IV SOLUTION) ×2 IMPLANT
PACK BASIN DAY SURGERY FS (CUSTOM PROCEDURE TRAY) ×2 IMPLANT
PAD ALCOHOL SWAB (MISCELLANEOUS) ×3 IMPLANT
PENCIL BUTTON HOLSTER BLD 10FT (ELECTRODE) ×2 IMPLANT
PIN SAFETY STERILE (MISCELLANEOUS) IMPLANT
SLEEVE SCD COMPRESS KNEE MED (MISCELLANEOUS) IMPLANT
SPONGE GAUZE 4X4 12PLY (GAUZE/BANDAGES/DRESSINGS) ×2 IMPLANT
SPONGE LAP 18X18 X RAY DECT (DISPOSABLE) IMPLANT
SPONGE LAP 4X18 X RAY DECT (DISPOSABLE) ×2 IMPLANT
STRIP CLOSURE SKIN 1/2X4 (GAUZE/BANDAGES/DRESSINGS) IMPLANT
SUT ETHILON 3 0 FSL (SUTURE) IMPLANT
SUT MNCRL AB 4-0 PS2 18 (SUTURE) ×4 IMPLANT
SUT SILK 2 0 SH (SUTURE) ×2 IMPLANT
SUT VIC AB 2-0 CT1 27 (SUTURE)
SUT VIC AB 2-0 CT1 TAPERPNT 27 (SUTURE) IMPLANT
SUT VIC AB 3-0 SH 27 (SUTURE)
SUT VIC AB 3-0 SH 27X BRD (SUTURE) IMPLANT
SUT VICRYL 3-0 CR8 SH (SUTURE) ×3 IMPLANT
SYR CONTROL 10ML LL (SYRINGE) ×4 IMPLANT
TOWEL OR 17X24 6PK STRL BLUE (TOWEL DISPOSABLE) ×4 IMPLANT
TOWEL OR NON WOVEN STRL DISP B (DISPOSABLE) ×2 IMPLANT
TUBE CONNECTING 20X1/4 (TUBING) ×2 IMPLANT
WATER STERILE IRR 1000ML POUR (IV SOLUTION) ×2 IMPLANT
YANKAUER SUCT BULB TIP NO VENT (SUCTIONS) ×2 IMPLANT

## 2012-10-08 NOTE — Transfer of Care (Signed)
Immediate Anesthesia Transfer of Care Note  Patient: [name]  Procedure(s) Performed: Procedure(s) (LRB) with comments: PARTIAL MASTECTOMY WITH NEEDLE LOCALIZATION AND AXILLARY SENTINEL LYMPH NODE BX (Left) - Left partial mastectomy with Needle localization and Sentinel lymph node biospy  Patient Location: PACU  Anesthesia Type:General  Level of Consciousness: awake, alert  and oriented  Airway & Oxygen Therapy: Patient Spontanous Breathing and Patient connected to face mask oxygen  Post-op Assessment: Report given to PACU RN and Post -op Vital signs reviewed and stable  Post vital signs: Reviewed and stable  Complications: No apparent anesthesia complications

## 2012-10-08 NOTE — Op Note (Signed)
Patient Name:           [name]   Date of Surgery:        10/08/2012  Pre op Diagnosis:      Invasive ductal carcinoma left breast, upper inner quadrant, 2.3 cm, receptor positive, HER-2-negative, clinical stage T2, N0  Post op Diagnosis:    Same  Procedure:                 Inject blue dye left breast, left partial mastectomy with needle localization, left axillary sentinel lymph node biopsy  Surgeon:                     Angelia Mould. Derrell Lolling, M.D., FACS  Assistant:                      None  Operative Indications:   Operative Findings:       [name] is a 78 y.o. female. She is referred by Dr. Drue Second and by Dr. Juel Burrow at the [facility] for evaluation of a newly diagnosed invasive ductal carcinoma, left breast, upper inner quadrant.  This patient states she has never had a mammogram. She felt a lump in the left breast about 2 weeks ago and went for imaging studies. Mammogram and ultrasound show a 1.7 x 2.3 x 1.4 cm mass in the left breast at the 10:00 position, 3 cm from the nipple. Image guided biopsy shows invasive ductal carcinoma, ER 100%, PR 13%, Ki-67 14%, HER-2 negative.  MRI Showed this to be a solitary finding.  She seems very motivated for breast conservation surgery. There is a palpable mass in the left breast above the areolar margin at about the 11:00 position. Her breasts are not that large, and she will lose volume, but she would like to avoid mastectomy unless  this is absolutely necessary.  Family history is positive for breast cancer in a first cousin and aggressive prostate cancer in her father.  Comorbidities include past history of alcohol abuse, hypertension, iron deficiency anemia requiring Aranesp injections by Dr. Welton Flakes, TAH, BSO and appendectomy. Chronic kidney disease.  Operative Findings: In the supine position, the patient's anterior to posterior volume was not that great. To get all the way around this tumor we had to take an ellipse of skin and we  had to take it all the way down to the pectoralis fascia. This created some volume loss in the upper pole of the breast. The specimen mammogram looked good with the tumor and the marker clip and the wire completely contained within the specimen. I found 3 sentinel lymph nodes.   Procedure in Detail:          The patient underwent wire localization at the BCG. The wire was placed through the tumor. The wire entered from the lateral aspect of the breast and went medeially across the superior pole of the breast and entered and and went through the tumor at the 11:00 position. The patient was brought to the holding area at [facility] center, where the nuclear medicine technician injected radionuclide into the left breast. The patient was taken to the operating room and general anesthesia was induced. Intravenous antibiotics were given. Surgical time out was performed. Following alcohol prep I injected 5 cc of blue dye into the left breast, subareolar area and massaged the breast for 5 minutes. The left breast, chest wall and axilla were prepped and draped in a sterile fashion. 0.5% Marcaine with  epinephrine was used a local infiltration anesthetic.  Using a marking pen I drew a somewhat transverse curvilinear crescent shaped incision overlying the tumor in the left breast at the 11:00 position. Incision was made and I dissected around the tumor and down to the deeper portion of the breast. I removed the specimen. On the posterior aspect of the specimen I could see the tip of the wire. I marked for the primary specimen with silk sutures and 6-colored ink kit. Specimen mammogram looked pretty good. I was concerned that I was close on the posterior margin so I reexcised the posterior margin and marked that with ink and sent it as a separate specimen. Hemostasis was excellent and achieved with electrocautery. It was irrigated with saline. I mobilized the breast superiorly and inferiorly as best I could. I pulled the tissue  somewhat together to reconstruct the best  as possible. I did this with numerous interrupted sutures of 3-0 Vicryl. I marked  the lumpectomy cavity with metal marker clips. The skin was closed with a running subcuticular suture of 4-0 Monocryl and Dermabond. I then made a transverse incision in the left axilla. Dissection was carried down through the subcutaneous tissue. I incised the clavipectoral fascia and entered and explored the axilla. Using the neoprobe I found 3 sentinel lymph nodes. These were all sent for routine histology. After the 3 nodes were removed I felt no other lymph nodes were radioactive. Hemostasis was excellent and achieved with electrocautery. I irrigated the axillary wound. The deeper tissues were closed with 3-0 Vicryl sutures and the skin closed with a running subcuticular suture of 4-0 Monocryl and Dermabond. Breast binder was placed and the patient taken to recovery room stable condition. EBL 20 cc. Complications none. Counts correct.     Angelia Mould. Derrell Lolling, M.D., FACS General and Minimally Invasive Surgery Breast and Colorectal Surgery  10/08/2012 5:00 PM

## 2012-10-08 NOTE — Anesthesia Postprocedure Evaluation (Signed)
  Anesthesia Post-op Note  Patient: [name]  Procedure(s) Performed: Procedure(s) (LRB) with comments: PARTIAL MASTECTOMY WITH NEEDLE LOCALIZATION AND AXILLARY SENTINEL LYMPH NODE BX (Left) - Left partial mastectomy with Needle localization and Sentinel lymph node biospy  Patient Location: PACU  Anesthesia Type:General  Level of Consciousness: awake, alert  and oriented  Airway and Oxygen Therapy: Patient Spontanous Breathing and Patient connected to face mask oxygen  Post-op Pain: mild  Post-op Assessment: Post-op Vital signs reviewed  Post-op Vital Signs: Reviewed  Complications: No apparent anesthesia complications

## 2012-10-08 NOTE — Anesthesia Preprocedure Evaluation (Signed)
Anesthesia Evaluation  Patient identified by MRN, date of birth, ID band Patient awake    Reviewed: Allergy & Precautions, H&P , NPO status , Patient's Chart, lab work & pertinent test results  Airway Mallampati: I TM Distance: >3 FB Neck ROM: Full    Dental  (+) Teeth Intact and Dental Advisory Given   Pulmonary  breath sounds clear to auscultation        Cardiovascular hypertension, Pt. on medications Rhythm:Regular Rate:Normal     Neuro/Psych    GI/Hepatic   Endo/Other    Renal/GU      Musculoskeletal   Abdominal   Peds  Hematology   Anesthesia Other Findings   Reproductive/Obstetrics                           Anesthesia Physical Anesthesia Plan  ASA: II  Anesthesia Plan: General   Post-op Pain Management:    Induction: Intravenous  Airway Management Planned: LMA  Additional Equipment:   Intra-op Plan:   Post-operative Plan: Extubation in OR  Informed Consent: I have reviewed the patients History and Physical, chart, labs and discussed the procedure including the risks, benefits and alternatives for the proposed anesthesia with the patient or authorized representative who has indicated his/her understanding and acceptance.   Dental advisory given  Plan Discussed with: CRNA, Anesthesiologist and Surgeon  Anesthesia Plan Comments:         Anesthesia Quick Evaluation  

## 2012-10-08 NOTE — Interval H&P Note (Signed)
History and Physical Interval Note:  10/08/2012 3:05 PM  [name]  has presented today for surgery, with the diagnosis of Left breast ductal carcinoma  The goals and the various methods of treatment have been discussed with the patient and family. After consideration of risks, benefits and other options for treatment, the patient has consented to  Procedure(s) (LRB) with comments: PARTIAL MASTECTOMY WITH NEEDLE LOCALIZATION AND AXILLARY SENTINEL LYMPH NODE BX (Left) - Left partial mastectomy with Needle localization and Sentinel lymph node biospy as a surgical intervention .  The patient's history has been reviewed, patient examined today, no change in status, stable for surgery.  I have reviewed the patient's chart and labs.  Questions were answered to the patient's satisfaction.     Ernestene Mention

## 2012-10-09 ENCOUNTER — Encounter (HOSPITAL_BASED_OUTPATIENT_CLINIC_OR_DEPARTMENT_OTHER): Payer: Self-pay | Admitting: General Surgery

## 2012-10-12 ENCOUNTER — Telehealth (INDEPENDENT_AMBULATORY_CARE_PROVIDER_SITE_OTHER): Payer: Self-pay | Admitting: General Surgery

## 2012-10-12 ENCOUNTER — Other Ambulatory Visit (INDEPENDENT_AMBULATORY_CARE_PROVIDER_SITE_OTHER): Payer: Self-pay | Admitting: General Surgery

## 2012-10-12 DIAGNOSIS — C50919 Malignant neoplasm of unspecified site of unspecified female breast: Secondary | ICD-10-CM

## 2012-10-12 NOTE — Telephone Encounter (Signed)
Called patient to advise of pathology report per Dr. Jacinto Halim request "Tell her that we have clear margins, negative nodes, and there is no need for further surgery. Good news! Refer to medical oncology and radiation oncology asap". Patient agreed and confirmed appointment to see Dr. Derrell Lolling on 10/22/12.

## 2012-10-12 NOTE — Progress Notes (Signed)
Quick Note:  Inform patient of Pathology report,. Tell her that we have clear margins, negative nodes, and there is no need for further surgery. Good News! Refer to medical oncology and radiation oncology ASAP. ______

## 2012-10-13 ENCOUNTER — Telehealth: Payer: Self-pay | Admitting: *Deleted

## 2012-10-13 ENCOUNTER — Encounter: Payer: Self-pay | Admitting: *Deleted

## 2012-10-13 NOTE — Progress Notes (Signed)
Ordered Oncotype Dx test w/ [facility].  Faxed request to Path. 

## 2012-10-13 NOTE — Telephone Encounter (Signed)
Changed patient appointment to NP schedule

## 2012-10-22 ENCOUNTER — Ambulatory Visit (INDEPENDENT_AMBULATORY_CARE_PROVIDER_SITE_OTHER): Payer: PRIVATE HEALTH INSURANCE | Admitting: General Surgery

## 2012-10-22 ENCOUNTER — Encounter: Payer: Self-pay | Admitting: Radiation Oncology

## 2012-10-22 ENCOUNTER — Encounter (INDEPENDENT_AMBULATORY_CARE_PROVIDER_SITE_OTHER): Payer: Self-pay | Admitting: General Surgery

## 2012-10-22 ENCOUNTER — Encounter: Payer: Self-pay | Admitting: *Deleted

## 2012-10-22 VITALS — BP 130/64 | HR 74 | Temp 97.6°F | Resp 16 | Ht 63.0 in | Wt 134.0 lb

## 2012-10-22 DIAGNOSIS — C50919 Malignant neoplasm of unspecified site of unspecified female breast: Secondary | ICD-10-CM

## 2012-10-22 NOTE — Progress Notes (Signed)
Patient ID: [name], female   DOB: 15-Aug-1932, 79 y.o.   MRN: [id number] History: This patient underwent left partial mastectomy and sentinel lobe biopsy on 10/08/2012. She had a 2.1 cm invasive ductal carcinoma, all 3 sentinel nodes were negative. I had to re excise the posterior margin. Final pathology report shows negative margins. Pathologic stage T2, N0, ER 100%, PR 13%, HER-2-negative, Ki 67 14%. She is feeling fine and has no complaints about her breast. She has an appointment to see Dr. Roselind Messier on January 6 and Dr. Welton Flakes on  January 10.  Exam: Patient is well. No complaints Left breast reveals transverse incision above the areola. Some volume loss because of the extent of resection. No hematoma. No seroma. No skin necrosis. Axillary incision looks fine. No arm swelling or sensory deficit  Assessment: Invasive carcinoma left breast, pathologic stage T2, N0, receptor positive, HER-2-negative, Ki 67 14%. Recovering uneventfully following left partial mastectomy, reexcision posterior margin, and sentinel lymph biopsy  Plan: Keep her appointments next week with Dr. Roselind Messier and  with Dr. Welton Flakes Return to see me in 4 months.   [name]. Derrell Lolling, M.D., [facility] General and Minimally invasive Surgery Breast and Colorectal Surgery Office:   [phone] Pager:   [phone]

## 2012-10-22 NOTE — Progress Notes (Signed)
Received Oncotype Dx results of 18.  Gave copy to MD.  Rochele Pages copy to Med Rec to scan.

## 2012-10-22 NOTE — Patient Instructions (Signed)
You are recovering from your left partial mastectomy and sentinel node biopsy without any obvious surgical complications. You had been given a copy of the pathology report and we have discussed that today.  Keep your appointment with Dr. Roselind Messier on January 6. Keep your appointment with Dr. Welton Flakes on January 10  Return to see Dr. Derrell Lolling in 4 months, sooner if there are any new concerns.

## 2012-10-26 ENCOUNTER — Encounter: Payer: Self-pay | Admitting: Radiation Oncology

## 2012-10-26 ENCOUNTER — Ambulatory Visit
Admission: RE | Admit: 2012-10-26 | Discharge: 2012-10-26 | Disposition: A | Discharge status: Home / Self Care | Payer: PRIVATE HEALTH INSURANCE | Source: Ambulatory Visit | Attending: Radiation Oncology | Admitting: Radiation Oncology

## 2012-10-26 VITALS — BP 115/47 | HR 70 | Temp 98.0°F | Wt 135.5 lb

## 2012-10-26 DIAGNOSIS — C50219 Malignant neoplasm of upper-inner quadrant of unspecified female breast: Secondary | ICD-10-CM

## 2012-10-26 DIAGNOSIS — C50419 Malignant neoplasm of upper-outer quadrant of unspecified female breast: Secondary | ICD-10-CM | POA: Insufficient documentation

## 2012-10-26 DIAGNOSIS — I10 Essential (primary) hypertension: Secondary | ICD-10-CM | POA: Insufficient documentation

## 2012-10-26 DIAGNOSIS — Z803 Family history of malignant neoplasm of breast: Secondary | ICD-10-CM | POA: Insufficient documentation

## 2012-10-26 DIAGNOSIS — Z8042 Family history of malignant neoplasm of prostate: Secondary | ICD-10-CM | POA: Insufficient documentation

## 2012-10-26 DIAGNOSIS — N289 Disorder of kidney and ureter, unspecified: Secondary | ICD-10-CM | POA: Insufficient documentation

## 2012-10-26 DIAGNOSIS — Z87891 Personal history of nicotine dependence: Secondary | ICD-10-CM | POA: Insufficient documentation

## 2012-10-26 DIAGNOSIS — Z79899 Other long term (current) drug therapy: Secondary | ICD-10-CM | POA: Insufficient documentation

## 2012-10-26 DIAGNOSIS — Z170 Estrogen receptor positive status [ER+]: Secondary | ICD-10-CM | POA: Insufficient documentation

## 2012-10-26 DIAGNOSIS — C50919 Malignant neoplasm of unspecified site of unspecified female breast: Secondary | ICD-10-CM

## 2012-10-26 NOTE — Addendum Note (Signed)
Encounter addended by: Tessa Lerner, RN on: 10/26/2012  5:11 PM<BR>     Documentation filed: Charges VN

## 2012-10-26 NOTE — Progress Notes (Signed)
Patient here for new radiation consultation of left breast cancer.ER+PR+ HER2-negative.Doing very well since left breast partial mastectomy with reexcision posterior margin and sentinel node biopsy.Scheduled to see Dr.Khan later this week.

## 2012-10-26 NOTE — Progress Notes (Signed)
Please see the Nurse Progress Note in the MD Initial Consult Encounter for this patient. 

## 2012-10-26 NOTE — Progress Notes (Signed)
Radiation Oncology         (336) [phone] ________________________________  Initial outpatient Consultation  Name: [name] MRN: [id number]  Date: 10/26/2012  DOB: 05/28/1932  NW:GNFAO,ZHYQM J, MD  Ernestene Mention, MD   REFERRING PHYSICIAN: Ernestene Mention, MD  DIAGNOSIS: The encounter diagnosis was Breast cancer.  HISTORY OF PRESENT ILLNESS::[name] is a 79 y.o. female who is seen out of the courtesy of Dr. Claud Kelp for an opinion concerning radiation therapy as part of management of patient's recently diagnosed left breast cancer.   late last year the patient palpated a mass in the upper-outer aspect of her left breast. She also noticed some mild nipple discharge but no bleeding.   she underwent a mammogram which confirmed a mass in the left breast.   a needle core biopsy was performed which revealed invasive ductal carcinoma.   the tumor was estrogen receptor positive at 100% and progesterone receptor positive at 13%. Ki 67 was low at 14%   patient was seen by Dr. Derrell Lolling. She wished to undergo breast conserving therapy. On December 19 patient underwent a left partial mastectomy and sentinel node procedure. The posterior margin was close but reexcision showed clear margins.  the patient was found to have a 2.1 cm invasive ductal carcinoma, grade 1.  There were 3 benign sentinel lymph nodes removed. She has been doing well since her surgery. She is now seen in radiation oncology for discussion of radiation therapy as part of  breast conserving treatment.[name]  PREVIOUS RADIATION THERAPY: No  PAST MEDICAL HISTORY:  has a past medical history of Anemia of chronic disease; Anxiety; Hypertension; Hepatitis; Lipid disorder; Wears glasses; Renal insufficiency; Wears dentures; Breast cancer (08/28/12); and Breast cancer (09/01/2012).    PAST SURGICAL HISTORY: Past Surgical History  Procedure Date  . Abdominal hysterectomy      approx 20 years ago  . Appendectomy     20  years ago  . Foot surgery 1992    bone spurs both feet  . Colonoscopy 2009?  [name] Partial mastectomy with needle localization and axillary sentinel lymph node bx 10/08/2012    Procedure: PARTIAL MASTECTOMY WITH NEEDLE LOCALIZATION AND AXILLARY SENTINEL LYMPH NODE BX;  Surgeon: Ernestene Mention, MD;  Location: [facility];  Service: General;  Laterality: Left;  Left partial mastectomy with Needle localization and Sentinel lymph node biospy    FAMILY HISTORY: family history includes Breast cancer in her cousin; Cancer in her father; Prostate cancer in her father; and Tuberculosis in her mother.  SOCIAL HISTORY:  reports that she quit smoking about 5 years ago. Her smoking use included Cigarettes. She smoked 1 pack per day. She has never used smokeless tobacco. She reports that she drinks about .6 ounces of alcohol per week. She reports that she does not use illicit drugs.  ALLERGIES: Review of patient's allergies indicates no known allergies.  MEDICATIONS:  Current Outpatient Prescriptions  Medication Sig Dispense Refill  . allopurinol (ZYLOPRIM) 100 MG tablet Take 100 mg by mouth daily.       . Amlodipine-Valsartan-HCTZ (EXFORGE HCT) 5-160-12.5 MG TABS Take 1 tablet by mouth daily.      . calcium carbonate (OS-CAL) 600 MG TABS Take 600 mg by mouth daily.      . diclofenac sodium (VOLTAREN) 1 % GEL Apply 2 g topically 3 (three) times daily as needed. For arthritis pain      . FeFum-FePoly-FA-B Cmp-C-Biot (INTEGRA PLUS PO) Take 1 tablet by mouth daily.      [name]  folic acid (FOLVITE) 400 MCG tablet Take 400-800 mcg by mouth daily.      . Omega-3 Fatty Acids (FISH OIL) 500 MG CAPS Take 1 capsule by mouth daily.      . vitamin C (ASCORBIC ACID) 500 MG tablet Take 500 mg by mouth daily.        REVIEW OF SYSTEMS:  A 15 point review of systems is documented in the electronic medical record. This was obtained by the nursing staff. However, I reviewed this with the patient to discuss relevant  findings and make appropriate changes.  She has some soreness in the left breast since her surgery. She denies any numbness or swelling in her left arm.  She denies any pain in the breast area prior to diagnosis. As above she has mild nipple discharge but no bleeding.   PHYSICAL EXAM:  weight is 135 lb 8 oz (61.462 kg). Her temperature is 98 F (36.7 C). Her blood pressure is 115/47 and her pulse is 70.   BP 115/47  Pulse 70  Temp 98 F (36.7 C)  Wt 135 lb 8 oz (61.462 kg)  General Appearance:    Alert, cooperative, no distress, appears stated age  Head:    Normocephalic, without obvious abnormality, atraumatic  Eyes:    PERRL, conjunctiva/corneas clear, EOM's intact,       Nose:   Nares normal, septum midline, mucosa normal, no drainage    or sinus tenderness  Throat:   Lips, mucosa, and tongue normal; gums normal, dentures in place   Neck:   Supple, symmetrical, trachea midline, no adenopathy;    thyroid:  no enlargement/tenderness/nodules; no carotid   bruit or JVD  Back:     Symmetric, no curvature, ROM normal, no CVA tenderness  Lungs:     Clear to auscultation bilaterally, respirations unlabored  Chest Wall:    No tenderness or deformity   Heart:    Regular rate and rhythm,   Breast Exam:    No tenderness, masses, or nipple abnormality in right breast, the left breast shows a long lumpectomy scar in the upper aspect of the left breast adjacent to the areolar border. Patient has a separate scar in the low axillary region from her sentinel node procedure.  there is no signs of infection within the breast.   Abdomen:     Soft, non-tender, bowel sounds active all four quadrants,    no masses, no organomegaly        Extremities:   Extremities normal, atraumatic, no cyanosis or edema  Pulses:   2+ and symmetric all extremities  Skin:   Skin color, texture, turgor normal, no rashes or lesions  Lymph nodes:   Cervical, supraclavicular, and axillary nodes normal  Neurologic:    normal  strength, sensation and reflexes    throughout    LABORATORY DATA:  Lab Results  Component Value Date   WBC 5.4 10/07/2012   HGB 12.1 10/07/2012   HCT 35.7* 10/07/2012   MCV 95.7 10/07/2012   PLT 246 10/07/2012   Lab Results  Component Value Date   NA 140 10/07/2012   K 4.4 10/07/2012   CL 102 10/07/2012   CO2 26 10/07/2012   Lab Results  Component Value Date   ALT 39* 10/07/2012   AST 51* 10/07/2012   ALKPHOS 113 10/07/2012   BILITOT 0.3 10/07/2012     RADIOGRAPHY: Nm Sentinel Node Inj-no Rpt (breast)  10/08/2012  CLINICAL DATA: Cancer left breast   Sulfur colloid was  injected intradermally by the nuclear medicine  technologist for breast cancer sentinel node localization.     [place] Wire Localization Left  10/08/2012  *RADIOLOGY REPORT*  Clinical Data:  Patient presents for needle localization of a biopsy-proven malignancy over the 10 o'clock position of the left breast.  NEEDLE LOCALIZATION USING ULTRASOUND GUIDANCE AND SPECIMEN RADIOGRAPH  Comparison: Previous exams.  Patient presents for needle localization prior to surgical excision.  I met with the patient and we discussed the procedure of needle localization including benefits and alternatives. We discussed the high likelihood of a successful procedure. We discussed the risks of the procedure, including infection, bleeding, tissue injury, and further surgery. Informed, written consent was given.  Using ultrasound guidance, sterile technique, 2% lidocaine and a 7 cmultra-wire, the targeted mass over the inner upper left breast was localized using a lateral to medial approach.  The films are marked for Dr. Derrell Lolling.  Specimen radiograph is performed at , and confirms the targeted mass with metallic clip and wire present in the tissue sample.  The specimen is marked for pathology.  IMPRESSION: Needle localization left breast.  No apparent complications.   Original Report Authenticated By: Elberta Fortis, M.D.    [facility] Breast Surgical  Specimen  10/08/2012  *RADIOLOGY REPORT*  Clinical Data:  Patient presents for needle localization of a biopsy-proven malignancy over the 10 o'clock position of the left breast.  NEEDLE LOCALIZATION USING ULTRASOUND GUIDANCE AND SPECIMEN RADIOGRAPH  Comparison: Previous exams.  Patient presents for needle localization prior to surgical excision.  I met with the patient and we discussed the procedure of needle localization including benefits and alternatives. We discussed the high likelihood of a successful procedure. We discussed the risks of the procedure, including infection, bleeding, tissue injury, and further surgery. Informed, written consent was given.  Using ultrasound guidance, sterile technique, 2% lidocaine and a 7 cmultra-wire, the targeted mass over the inner upper left breast was localized using a lateral to medial approach.  The films are marked for Dr. Derrell Lolling.  Specimen radiograph is performed at , and confirms the targeted mass with metallic clip and wire present in the tissue sample.  The specimen is marked for pathology.  IMPRESSION: Needle localization left breast.  No apparent complications.   Original Report Authenticated By: Elberta Fortis, M.D.       IMPRESSION: Stage II-a invasive ductal carcinoma of the left breast. Patient would be a good candidate for radiation therapy as part of  breast conserving treatment. I did discuss with the patient she may be a potential candidate for partial mastectomy alone if she agrees to adjuvant hormonal therapy. Patient's family has significant  longevity. She is in excellent health  in addition. Given these issues the patient would like to proceed with radiation therapy as part of her treatment as well as adjuvant hormonal therapy. Patient will be seeing Dr. Welton Flakes in the next several days for medical oncology evaluation.  PLAN: Simulation and planning on 11/03/2012.  I spent 60 minutes minutes face to face with the patient and more than 50% of that  time was spent in counseling and/or coordination of care.   ------------------------------------------------   Billie Lade, PhD, MD

## 2012-10-28 ENCOUNTER — Other Ambulatory Visit: Payer: Self-pay | Admitting: Oncology

## 2012-10-29 ENCOUNTER — Other Ambulatory Visit: Payer: PRIVATE HEALTH INSURANCE | Admitting: Lab

## 2012-10-29 ENCOUNTER — Ambulatory Visit: Payer: PRIVATE HEALTH INSURANCE

## 2012-10-29 ENCOUNTER — Ambulatory Visit: Payer: PRIVATE HEALTH INSURANCE | Admitting: Oncology

## 2012-10-30 ENCOUNTER — Encounter: Payer: Self-pay | Admitting: Adult Health

## 2012-10-30 ENCOUNTER — Ambulatory Visit (HOSPITAL_BASED_OUTPATIENT_CLINIC_OR_DEPARTMENT_OTHER): Payer: PRIVATE HEALTH INSURANCE | Admitting: Adult Health

## 2012-10-30 ENCOUNTER — Other Ambulatory Visit (HOSPITAL_BASED_OUTPATIENT_CLINIC_OR_DEPARTMENT_OTHER): Payer: PRIVATE HEALTH INSURANCE

## 2012-10-30 ENCOUNTER — Telehealth: Payer: Self-pay | Admitting: Oncology

## 2012-10-30 VITALS — BP 138/70 | HR 83 | Temp 97.6°F | Resp 20 | Ht 63.0 in | Wt 136.7 lb

## 2012-10-30 DIAGNOSIS — D638 Anemia in other chronic diseases classified elsewhere: Secondary | ICD-10-CM

## 2012-10-30 DIAGNOSIS — C50219 Malignant neoplasm of upper-inner quadrant of unspecified female breast: Secondary | ICD-10-CM

## 2012-10-30 DIAGNOSIS — N289 Disorder of kidney and ureter, unspecified: Secondary | ICD-10-CM

## 2012-10-30 DIAGNOSIS — Z170 Estrogen receptor positive status [ER+]: Secondary | ICD-10-CM

## 2012-10-30 DIAGNOSIS — C50919 Malignant neoplasm of unspecified site of unspecified female breast: Secondary | ICD-10-CM

## 2012-10-30 DIAGNOSIS — D631 Anemia in chronic kidney disease: Secondary | ICD-10-CM

## 2012-10-30 LAB — CBC WITH DIFFERENTIAL/PLATELET
Basophils Absolute: 0.1 10*3/uL (ref 0.0–0.1)
Eosinophils Absolute: 0.1 10*3/uL (ref 0.0–0.5)
LYMPH%: 22.1 % (ref 14.0–49.7)
MCV: 98.9 fL (ref 79.5–101.0)
MONO%: 10.3 % (ref 0.0–14.0)
NEUT#: 4.4 10*3/uL (ref 1.5–6.5)
NEUT%: 64.4 % (ref 38.4–76.8)
Platelets: 215 10*3/uL (ref 145–400)
RBC: 3.29 10*6/uL — ABNORMAL LOW (ref 3.70–5.45)

## 2012-10-30 LAB — FERRITIN: Ferritin: 42 ng/mL (ref 10–291)

## 2012-10-30 LAB — IRON AND TIBC: Iron: 328 ug/dL — ABNORMAL HIGH (ref 42–145)

## 2012-10-30 NOTE — Progress Notes (Signed)
OFFICE PROGRESS NOTE  CC Dr. Renaye Rakers  DIAGNOSIS: Anemia of chronic disease, renal insufficiency and iron deficiency, Breast cancer.  PRIOR THERAPY: #1 Aranesp injections every 4 weeks and iron as indicated.   #2 mammogram and ultrasound guided core biopsy of the left breast. Prognostic markers showed invasive ductal carcinoma grade 1 or 2 ER positive PR positive HER-2/neu negative Ki-67 around 14%.  #3 Left partial mastectomy and sentinel node biopsy on 10/08/12, 2.1cm invasive ductal carcinoma, all three sentinel nodes were negatve.  Posterior margin had to be re-excised.    #4 Radiation therapy to start 1/14  CURRENT THERAPY: Aranesp injections every 4 weeks and iron as indicated.   INTERVAL HISTORY:  Patient returns in followup for CBC check and possibly Aranesp injection.  She's doing well.  She says her partial mastectomy site is healing well.  She's looking forward to her simulation for radiation on Tuesday.  She has no fevers, chills, or any other concerns.     ALLERGIES:  None  ROS:  General: fatigue (+), night sweats (-), fever (-), pain (-) Lymph: palpable nodes (-) HEENT: vision changes (-), mucositis (-), gum bleeding (-), epistaxis (-) Cardiovascular: chest pain (-), palpitations (-) Pulmonary: shortness of breath (-), dyspnea on exertion (-), cough (-), hemoptysis (-) GI:  Early satiety (-), melena (-), dysphagia (-), nausea/vomiting (-), diarrhea (-) GU: dysuria (-), hematuria (-), incontinence (-) Musculoskeletal: joint swelling (-), joint pain (-), back pain (-) Neuro: weakness (-), numbness (-), headache (-), confusion (-) Skin: Rash (-), lesions (-), dryness (-) Psych: depression (-), suicidal/homicidal ideation (-), feeling of hopelessness (-)   PHYSICAL EXAMINATION:  BP 138/70  Pulse 83  Temp 97.6 F (36.4 C)  Resp 20  Ht 5\' 3"  (1.6 m)  Wt 136 lb 11.2 oz (62.007 kg)  BMI 24.22 kg/m2. General: Well developed, well nourished, in no acute  distress.  EENT: No ocular or oral lesions. No stomatitis.  Respiratory: Lungs are clear to auscultation bilaterally with normal respiratory movement and no accessory muscle use. Cardiac: No murmur, rub or tachycardia. No upper or lower extremity edema.  GI: Abdomen is soft, no palpable hepatosplenomegaly. No fluid wave. No tenderness. Musculoskeletal: No kyphosis, no tenderness over the spine, ribs or hips. Lymph: No cervical, infraclavicular, axillary or inguinal adenopathy. Neuro: No focal neurological deficits. Psych: Alert and oriented X 3, appropriate mood and affect.  ECOG PERFORMANCE STATUS: 0 - Asymptomatic    LABORATORY DATA: Lab Results  Component Value Date   WBC 6.9 10/30/2012   HGB 11.3* 10/30/2012   HCT 32.5* 10/30/2012   MCV 98.9 10/30/2012   PLT 215 10/30/2012      Chemistry      Component Value Date/Time   NA 140 10/07/2012 1000   NA 138 04/12/2010 0954   K 4.4 10/07/2012 1000   K 4.8* 04/12/2010 0954   CL 102 10/07/2012 1000   CL 101 04/12/2010 0954   CO2 26 10/07/2012 1000   CO2 26 04/12/2010 0954   BUN 22 10/07/2012 1000   BUN 14 04/12/2010 0954   CREATININE 1.36* 10/07/2012 1000   CREATININE 1.2 04/12/2010 0954      Component Value Date/Time   CALCIUM 10.3 10/07/2012 1000   CALCIUM 9.8 04/12/2010 0954   ALKPHOS 113 10/07/2012 1000   ALKPHOS 106* 04/12/2010 0954   AST 51* 10/07/2012 1000   AST 80* 04/12/2010 0954   ALT 39* 10/07/2012 1000   BILITOT 0.3 10/07/2012 1000   BILITOT 0.50 04/12/2010 0954  ASSESSMENT: 79 year old female with  #1 history of anemia of chronic disease and iron deficiency patient has been receiving Aranesp injections.  #2 new diagnosis of breast cancer in the left breast that is ER positive HER-2/neu negative. Awaiting lumpectomy.  PLAN:  #1 [name] will proceed with radiation therapy as scheduled.  #2 her H&H looks good and therefore she will not need any Aranesp.  #3 She will f/u next month for labs/appt/possible  aranesp.    All questions were answered. The patient knows to call the clinic with any problems, questions or concerns. The length of time of the face-to-face encounter was 30  minutes. More than 50% of time was spent counseling and coordination of care.  Cherie Ouch Lyn Hollingshead, NP Medical Oncology [facility] Phone: [phone]  10/30/2012, 2:51 PM

## 2012-10-30 NOTE — Telephone Encounter (Signed)
appts made and printed for pt  °

## 2012-10-30 NOTE — Patient Instructions (Addendum)
Doing well.  We will see you back in 1 month, you do not need your aranesp today.  Please call us if you have any questions or concerns.

## 2012-11-03 ENCOUNTER — Telehealth: Payer: Self-pay | Admitting: Radiation Oncology

## 2012-11-03 ENCOUNTER — Ambulatory Visit
Admission: RE | Admit: 2012-11-03 | Discharge: 2012-11-03 | Disposition: A | Discharge status: Home / Self Care | Payer: PRIVATE HEALTH INSURANCE | Source: Ambulatory Visit | Attending: Radiation Oncology | Admitting: Radiation Oncology

## 2012-11-03 DIAGNOSIS — Z79899 Other long term (current) drug therapy: Secondary | ICD-10-CM | POA: Insufficient documentation

## 2012-11-03 DIAGNOSIS — Z510 Encounter for antineoplastic radiation therapy: Secondary | ICD-10-CM | POA: Insufficient documentation

## 2012-11-03 DIAGNOSIS — C50919 Malignant neoplasm of unspecified site of unspecified female breast: Secondary | ICD-10-CM | POA: Insufficient documentation

## 2012-11-03 NOTE — Telephone Encounter (Signed)
Met w patient to discuss RO billing. Pt had transportation concern and was given a SCAT application, as well as, can use [employer] due to MCD eligible. Pt advise her son my not be able to bring her all the time, due to he works.  I sent a referral to the onsite SW to follow-up w patient in regards to possible transportation assistance.   Dx: Breast   Attending Rad: JK   Rad Tx: Daily

## 2012-11-03 NOTE — Progress Notes (Signed)
  Radiation Oncology         (336) [phone] ________________________________  Name: [name] MRN: [id number]  Date: 11/03/2012  DOB: Jul 09, 1932  SIMULATION AND TREATMENT PLANNING NOTE  DIAGNOSIS:  Left breast cancer  NARRATIVE:  The patient was brought to the CT Simulation planning suite.  Identity was confirmed.  All relevant records and images related to the planned course of therapy were reviewed.  The patient freely provided informed written consent to proceed with treatment after reviewing the details related to the planned course of therapy. The consent form was witnessed and verified by the simulation staff.  Then, the patient was set-up in a stable reproducible  supine position for radiation therapy.  CT images were obtained.  Surface markings were placed.  The CT images were loaded into the planning software.  Then the target and avoidance structures were contoured.  Treatment planning then occurred.  The radiation prescription was entered and confirmed.  A total of 3 complex treatment devices were fabricated. I have requested : Isodose Plan.  I have ordered: dose calc.  PLAN:  The patient will receive 42.5 Gy in 17 fractions followed by a boost of 3 fractions to 50.0 Gy ________________________________  -----------------------------------  Billie Lade, PhD, MD

## 2012-11-10 ENCOUNTER — Ambulatory Visit
Admission: RE | Admit: 2012-11-10 | Discharge: 2012-11-10 | Disposition: A | Discharge status: Home / Self Care | Payer: PRIVATE HEALTH INSURANCE | Source: Ambulatory Visit | Attending: Radiation Oncology | Admitting: Radiation Oncology

## 2012-11-10 ENCOUNTER — Telehealth: Payer: Self-pay | Admitting: Radiation Oncology

## 2012-11-10 DIAGNOSIS — C50919 Malignant neoplasm of unspecified site of unspecified female breast: Secondary | ICD-10-CM

## 2012-11-10 NOTE — Telephone Encounter (Signed)
Pt brought SCAT application back for transportation assistance when needed. Giving application to onsite SW's for further assisting at this time.

## 2012-11-10 NOTE — Progress Notes (Signed)
  Radiation Oncology         [phone]) [phone] ________________________________  Name: [name] MRN: [id number]  Date: 11/10/2012  DOB: [birth year]  Simulation Verification Note  Status: outpatient  NARRATIVE: The patient was brought to the treatment unit and placed in the planned treatment position. The clinical setup was verified. Then port films were obtained and uploaded to the radiation oncology medical record software.  The treatment beams were carefully compared against the planned radiation fields. The position location and shape of the radiation fields was reviewed. They targeted volume of tissue appears to be appropriately covered by the radiation beams. Organs at risk appear to be excluded as planned.  Based on my personal review, I approved the simulation verification. The patient's treatment will proceed as planned.  -----------------------------------  Billie Lade, PhD, MD

## 2012-11-11 ENCOUNTER — Ambulatory Visit: Payer: PRIVATE HEALTH INSURANCE

## 2012-11-12 ENCOUNTER — Ambulatory Visit
Admission: RE | Admit: 2012-11-12 | Discharge: 2012-11-12 | Disposition: A | Discharge status: Home / Self Care | Payer: PRIVATE HEALTH INSURANCE | Source: Ambulatory Visit | Attending: Radiation Oncology | Admitting: Radiation Oncology

## 2012-11-13 ENCOUNTER — Ambulatory Visit
Admission: RE | Admit: 2012-11-13 | Discharge: 2012-11-13 | Disposition: A | Discharge status: Home / Self Care | Payer: PRIVATE HEALTH INSURANCE | Source: Ambulatory Visit | Attending: Radiation Oncology | Admitting: Radiation Oncology

## 2012-11-16 ENCOUNTER — Ambulatory Visit
Admission: RE | Admit: 2012-11-16 | Discharge: 2012-11-16 | Disposition: A | Discharge status: Home / Self Care | Payer: PRIVATE HEALTH INSURANCE | Source: Ambulatory Visit | Attending: Radiation Oncology | Admitting: Radiation Oncology

## 2012-11-17 ENCOUNTER — Ambulatory Visit: Admission: RE | Admit: 2012-11-17 | Payer: PRIVATE HEALTH INSURANCE | Source: Ambulatory Visit

## 2012-11-18 ENCOUNTER — Ambulatory Visit: Payer: PRIVATE HEALTH INSURANCE

## 2012-11-19 ENCOUNTER — Ambulatory Visit
Admission: RE | Admit: 2012-11-19 | Discharge: 2012-11-19 | Disposition: A | Discharge status: Home / Self Care | Payer: PRIVATE HEALTH INSURANCE | Source: Ambulatory Visit | Attending: Radiation Oncology | Admitting: Radiation Oncology

## 2012-11-19 DIAGNOSIS — C50919 Malignant neoplasm of unspecified site of unspecified female breast: Secondary | ICD-10-CM

## 2012-11-19 MED ORDER — ALRA NON-METALLIC DEODORANT (RAD-ONC)
1.0000 "application " | Freq: Once | TOPICAL | Status: AC
  Administered 2012-11-19: 1 via TOPICAL

## 2012-11-19 MED ORDER — RADIAPLEXRX EX GEL
Freq: Once | CUTANEOUS | Status: AC
  Administered 2012-11-19: 12:00:00 via TOPICAL

## 2012-11-19 NOTE — Progress Notes (Signed)
Patient presented to the clinic today for post sim education following radiation treatment. Patient alert and oriented to person, place, and time. No distress noted. Steady gait noted. Pleasant affect noted. Patient denies pain at this time. No skin changes noted to left/treated breast. Oriented patient to staff and routine of the clinic. Provided patient with Radiation Therapy and You handbook then, reviewed pertinent information. Provided patient with radiaplex and alra then, directed upon use. Educated patient on potential side effects and management such as, skin changes and fatigue. Encouraged patient to contact staff with needs. All questions answered. Patient verbalized understanding of all reviewed.

## 2012-11-20 ENCOUNTER — Ambulatory Visit
Admission: RE | Admit: 2012-11-20 | Discharge: 2012-11-20 | Disposition: A | Discharge status: Home / Self Care | Payer: PRIVATE HEALTH INSURANCE | Source: Ambulatory Visit | Attending: Radiation Oncology | Admitting: Radiation Oncology

## 2012-11-23 ENCOUNTER — Ambulatory Visit
Admission: RE | Admit: 2012-11-23 | Discharge: 2012-11-23 | Disposition: A | Discharge status: Home / Self Care | Payer: PRIVATE HEALTH INSURANCE | Source: Ambulatory Visit | Attending: Radiation Oncology | Admitting: Radiation Oncology

## 2012-11-24 ENCOUNTER — Encounter: Payer: Self-pay | Admitting: Radiation Oncology

## 2012-11-24 ENCOUNTER — Ambulatory Visit
Admission: RE | Admit: 2012-11-24 | Discharge: 2012-11-24 | Disposition: A | Discharge status: Home / Self Care | Payer: PRIVATE HEALTH INSURANCE | Source: Ambulatory Visit | Attending: Radiation Oncology | Admitting: Radiation Oncology

## 2012-11-24 VITALS — BP 124/60 | HR 72 | Resp 16 | Wt 136.5 lb

## 2012-11-24 DIAGNOSIS — C50919 Malignant neoplasm of unspecified site of unspecified female breast: Secondary | ICD-10-CM

## 2012-11-24 NOTE — Progress Notes (Signed)
[facility]    Radiation Oncology [address] [place]     Maryln Gottron, M.D. [place], [place] [address]               Billie Lade, M.D., Ph.D. Phone: [phone]      Molli Hazard A. Kathrynn Running, M.D. Fax: [phone]      Radene Gunning, M.D., Ph.D.         Lurline Hare, M.D.         Grayland Jack, M.D Weekly Treatment Management Note  Name: [name]     MRN: [id number]        CSN: [id number] Date: 11/24/2012      DOB: 1931-12-23  CC: [name], MD         [name]    Status: Outpatient  Diagnosis: The encounter diagnosis was Breast cancer.  Current Dose: 17.5 Gy  Current Fraction: 7  Planned Dose: 50 Gy  Narrative: [name] was seen today for weekly treatment management. The chart was checked and port films  were reviewed. She is tolerating her treatments well at this time without any itching or discomfort in the breast. She denies any fatigue.  Review of patient's allergies indicates no known allergies.  Current Outpatient Prescriptions  Medication Sig Dispense Refill  . allopurinol (ZYLOPRIM) 100 MG tablet Take 100 mg by mouth daily.       . Amlodipine-Valsartan-HCTZ (EXFORGE HCT) 5-160-12.5 MG TABS Take 1 tablet by mouth daily.      . calcium carbonate (OS-CAL) 600 MG TABS Take 600 mg by mouth daily.      . diclofenac sodium (VOLTAREN) 1 % GEL Apply 2 g topically 3 (three) times daily as needed. For arthritis pain      . FeFum-FePoly-FA-B Cmp-C-Biot (INTEGRA PLUS PO) Take 1 tablet by mouth daily.      . folic acid (FOLVITE) 400 MCG tablet Take 400-800 mcg by mouth daily.      . non-metallic deodorant Thornton Papas) MISC Apply 1 application topically daily as needed.      . Omega-3 Fatty Acids (FISH OIL) 500 MG CAPS Take 1 capsule by mouth daily.      . vitamin C (ASCORBIC ACID) 500 MG tablet Take 500 mg by mouth daily.      . Wound Cleansers (RADIAPLEX EX) Apply topically.      [name] HYDROcodone-acetaminophen (NORCO/VICODIN) 5-325 MG per tablet  Take 1 tablet by mouth Every 4 hours as needed.       Labs:  Lab Results  Component Value Date   WBC 6.9 10/30/2012   HGB 11.3* 10/30/2012   HCT 32.5* 10/30/2012   MCV 98.9 10/30/2012   PLT 215 10/30/2012   Lab Results  Component Value Date   CREATININE 1.36* 10/07/2012   BUN 22 10/07/2012   NA 140 10/07/2012   K 4.4 10/07/2012   CL 102 10/07/2012   CO2 26 10/07/2012   Lab Results  Component Value Date   ALT 39* 10/07/2012   AST 51* 10/07/2012   BILITOT 0.3 10/07/2012    Physical Examination:  weight is 136 lb 8 oz (61.916 kg). Her blood pressure is 124/60 and her pulse is 72. Her respiration is 16.    Wt Readings from Last 3 Encounters:  11/24/12 136 lb 8 oz (61.916 kg)  10/30/12 136 lb 11.2 oz (62.007 kg)  10/26/12 135 lb 8 oz (61.462 kg)    The left breast area shows mild hyperpigmentation changes. Lungs - Normal respiratory effort,  chest expands symmetrically. Lungs are clear to auscultation, no crackles or wheezes.  Heart has regular rhythm and rate  Abdomen is soft and non tender with normal bowel sounds  Assessment:  Patient tolerating treatments well  Plan: Continue treatment per original radiation prescription

## 2012-11-24 NOTE — Progress Notes (Signed)
Patient presents to the clinic today unaccompanied for PUT with Dr. Roselind Messier. Patient alert and oriented to person, place, and time. No distress noted. Steady gait noted. Pleasant affect noted. Patient denies pain at this time. Patient denies breast pain. No edema of left arm. No skin changes of left breast noted. Patient reports using radiaplex bid as directed. Patient denies fatigue. Reported all findings to Dr. Roselind Messier.

## 2012-11-25 ENCOUNTER — Ambulatory Visit
Admission: RE | Admit: 2012-11-25 | Discharge: 2012-11-25 | Disposition: A | Discharge status: Home / Self Care | Payer: PRIVATE HEALTH INSURANCE | Source: Ambulatory Visit | Attending: Radiation Oncology | Admitting: Radiation Oncology

## 2012-11-26 ENCOUNTER — Ambulatory Visit: Payer: PRIVATE HEALTH INSURANCE

## 2012-11-26 ENCOUNTER — Ambulatory Visit
Admission: RE | Admit: 2012-11-26 | Discharge: 2012-11-26 | Disposition: A | Discharge status: Home / Self Care | Payer: PRIVATE HEALTH INSURANCE | Source: Ambulatory Visit | Attending: Radiation Oncology | Admitting: Radiation Oncology

## 2012-11-26 ENCOUNTER — Other Ambulatory Visit: Payer: PRIVATE HEALTH INSURANCE | Admitting: Lab

## 2012-11-27 ENCOUNTER — Other Ambulatory Visit: Payer: Self-pay | Admitting: Adult Health

## 2012-11-27 ENCOUNTER — Ambulatory Visit
Admission: RE | Admit: 2012-11-27 | Discharge: 2012-11-27 | Disposition: A | Discharge status: Home / Self Care | Payer: PRIVATE HEALTH INSURANCE | Source: Ambulatory Visit | Attending: Radiation Oncology | Admitting: Radiation Oncology

## 2012-11-27 DIAGNOSIS — D638 Anemia in other chronic diseases classified elsewhere: Secondary | ICD-10-CM

## 2012-11-30 ENCOUNTER — Ambulatory Visit
Admission: RE | Admit: 2012-11-30 | Discharge: 2012-11-30 | Disposition: A | Discharge status: Home / Self Care | Payer: PRIVATE HEALTH INSURANCE | Source: Ambulatory Visit | Attending: Radiation Oncology | Admitting: Radiation Oncology

## 2012-11-30 ENCOUNTER — Ambulatory Visit (HOSPITAL_BASED_OUTPATIENT_CLINIC_OR_DEPARTMENT_OTHER): Payer: PRIVATE HEALTH INSURANCE | Admitting: Adult Health

## 2012-11-30 ENCOUNTER — Encounter: Payer: Self-pay | Admitting: Adult Health

## 2012-11-30 ENCOUNTER — Other Ambulatory Visit (HOSPITAL_BASED_OUTPATIENT_CLINIC_OR_DEPARTMENT_OTHER): Payer: PRIVATE HEALTH INSURANCE | Admitting: Lab

## 2012-11-30 ENCOUNTER — Ambulatory Visit (HOSPITAL_BASED_OUTPATIENT_CLINIC_OR_DEPARTMENT_OTHER): Payer: PRIVATE HEALTH INSURANCE

## 2012-11-30 VITALS — BP 103/62 | HR 73 | Temp 98.0°F | Resp 20 | Ht 63.0 in | Wt 136.0 lb

## 2012-11-30 DIAGNOSIS — N289 Disorder of kidney and ureter, unspecified: Secondary | ICD-10-CM

## 2012-11-30 DIAGNOSIS — D638 Anemia in other chronic diseases classified elsewhere: Secondary | ICD-10-CM

## 2012-11-30 DIAGNOSIS — C50219 Malignant neoplasm of upper-inner quadrant of unspecified female breast: Secondary | ICD-10-CM

## 2012-11-30 DIAGNOSIS — D509 Iron deficiency anemia, unspecified: Secondary | ICD-10-CM

## 2012-11-30 DIAGNOSIS — C50919 Malignant neoplasm of unspecified site of unspecified female breast: Secondary | ICD-10-CM

## 2012-11-30 LAB — CBC WITH DIFFERENTIAL/PLATELET
Basophils Absolute: 0 10*3/uL (ref 0.0–0.1)
Eosinophils Absolute: 0.2 10*3/uL (ref 0.0–0.5)
HCT: 30.7 % — ABNORMAL LOW (ref 34.8–46.6)
HGB: 10.7 g/dL — ABNORMAL LOW (ref 11.6–15.9)
MONO#: 0.6 10*3/uL (ref 0.1–0.9)
NEUT#: 3.4 10*3/uL (ref 1.5–6.5)
NEUT%: 68.7 % (ref 38.4–76.8)
WBC: 4.9 10*3/uL (ref 3.9–10.3)
lymph#: 0.7 10*3/uL — ABNORMAL LOW (ref 0.9–3.3)

## 2012-11-30 MED ORDER — DARBEPOETIN ALFA-POLYSORBATE 300 MCG/0.6ML IJ SOLN
300.0000 ug | Freq: Once | INTRAMUSCULAR | Status: AC
  Administered 2012-11-30: 300 ug via SUBCUTANEOUS
  Filled 2012-11-30: qty 0.6

## 2012-11-30 NOTE — Progress Notes (Signed)
OFFICE PROGRESS NOTE  CC Dr. Renaye Rakers  DIAGNOSIS: Anemia of chronic disease, renal insufficiency and iron deficiency, Breast cancer.  PRIOR THERAPY: #1 Aranesp injections every 4 weeks and iron as indicated.   #2 mammogram and ultrasound guided core biopsy of the left breast. Prognostic markers showed invasive ductal carcinoma grade 1 or 2 ER positive PR positive HER-2/neu negative Ki-67 around 14%.  #3 Left partial mastectomy and sentinel node biopsy on 10/08/12, 2.1cm invasive ductal carcinoma, all three sentinel nodes were negatve.  Posterior margin had to be re-excised.    #4 Radiation therapy to start 1/14  CURRENT THERAPY: Aranesp injections every 4 weeks and iron as indicated.   INTERVAL HISTORY:  Patient returns in followup for CBC check and possibly Aranesp injection.  She's doing well.  She has two more weeks of radiation therapy and is tolerating the treatment quite well.  She is using her radiaplex lotion and her skin is doing well.  She has no fevers, chills, fatigue, or any other questions, or concerns.  A 10 point ROS is neg.   ALLERGIES:  None  ROS:  General: fatigue (-), night sweats (-), fever (-), pain (-) Lymph: palpable nodes (-) HEENT: vision changes (-), mucositis (-), gum bleeding (-), epistaxis (-) Cardiovascular: chest pain (-), palpitations (-) Pulmonary: shortness of breath (-), dyspnea on exertion (-), cough (-), hemoptysis (-) GI:  Early satiety (-), melena (-), dysphagia (-), nausea/vomiting (-), diarrhea (-) GU: dysuria (-), hematuria (-), incontinence (-) Musculoskeletal: joint swelling (-), joint pain (-), back pain (-) Neuro: weakness (-), numbness (-), headache (-), confusion (-) Skin: Rash (-), lesions (-), dryness (-) Psych: depression (-), suicidal/homicidal ideation (-), feeling of hopelessness (-)   PHYSICAL EXAMINATION:  BP 103/62  Pulse 73  Temp(Src) 98 F (36.7 C) (Oral)  Resp 20  Ht 5\' 3"  (1.6 m)  Wt 136 lb (61.689 kg)  BMI  24.1 kg/m2. General: Well developed, well nourished, in no acute distress.  EENT: No ocular or oral lesions. No stomatitis.  Respiratory: Lungs are clear to auscultation bilaterally with normal respiratory movement and no accessory muscle use. Cardiac: No murmur, rub or tachycardia. No upper or lower extremity edema.  GI: Abdomen is soft, no palpable hepatosplenomegaly. No fluid wave. No tenderness. Musculoskeletal: No kyphosis, no tenderness over the spine, ribs or hips. Lymph: No cervical, infraclavicular, axillary or inguinal adenopathy. Neuro: No focal neurological deficits. Psych: Alert and oriented X 3, appropriate mood and affect.  Breast: left breast with radiation changes, no erythema, lumpectomy site well healed with no nodularity.  Right breast: no nodularity or masses. ECOG PERFORMANCE STATUS: 0 - Asymptomatic    LABORATORY DATA: Lab Results  Component Value Date   WBC 4.9 11/30/2012   HGB 10.7* 11/30/2012   HCT 30.7* 11/30/2012   MCV 100.7 11/30/2012   PLT 200 11/30/2012      Chemistry      Component Value Date/Time   NA 140 10/07/2012 1000   NA 138 04/12/2010 0954   K 4.4 10/07/2012 1000   K 4.8* 04/12/2010 0954   CL 102 10/07/2012 1000   CL 101 04/12/2010 0954   CO2 26 10/07/2012 1000   CO2 26 04/12/2010 0954   BUN 22 10/07/2012 1000   BUN 14 04/12/2010 0954   CREATININE 1.36* 10/07/2012 1000   CREATININE 1.2 04/12/2010 0954      Component Value Date/Time   CALCIUM 10.3 10/07/2012 1000   CALCIUM 9.8 04/12/2010 0954   ALKPHOS 113 10/07/2012 1000  ALKPHOS 106* 04/12/2010 0954   AST 51* 10/07/2012 1000   AST 80* 04/12/2010 0954   ALT 39* 10/07/2012 1000   BILITOT 0.3 10/07/2012 1000   BILITOT 0.50 04/12/2010 0954      ASSESSMENT: 79 year old female with  #1 history of anemia of chronic disease and iron deficiency patient has been receiving Aranesp injections.  #2 new diagnosis of breast cancer in the left breast that is ER positive HER-2/neu negative. Awaiting  lumpectomy.  PLAN:  #1 [name] will proceed with radiation therapy as scheduled.  Her radiation will be completed in 2 weeks.  I have given her reading information on Arimidex for [place] to discuss and start at her next visit.    #2 her H&H is slightly low, and she will receive Aranesp today.  #3 She will f/u next month for labs/appt/possible aranesp, to start Arimidex, and iron studies.    All questions were answered. The patient knows to call the clinic with any problems, questions or concerns. The length of time of the face-to-face encounter was 30  minutes. More than 50% of time was spent counseling and coordination of care.  Cherie Ouch Lyn Hollingshead, NP Medical Oncology [facility] Phone: [phone]  11/30/2012, 9:04 AM

## 2012-11-30 NOTE — Patient Instructions (Signed)
Doing well.  Proceed with Aranesp today.  We will see you back on March 10.    Anastrozole tablets What is this medicine? ANASTROZOLE (an AS troe zole) is used to treat breast cancer in women who have gone through menopause. Some types of breast cancer depend on estrogen to grow, and this medicine can stop tumor growth by blocking estrogen production. This medicine may be used for other purposes; ask your health care provider or pharmacist if you have questions. What should I tell my health care provider before I take this medicine? They need to know if you have any of these conditions: -liver disease -an unusual or allergic reaction to anastrozole, other medicines, foods, dyes, or preservatives -pregnant or trying to get pregnant -breast-feeding How should I use this medicine? Take this medicine by mouth with a glass of water. Follow the directions on the prescription label. You can take this medicine with or without food. Take your doses at regular intervals. Do not take your medicine more often than directed. Do not stop taking except on the advice of your doctor or health care professional. Talk to your pediatrician regarding the use of this medicine in children. Special care may be needed. Overdosage: If you think you have taken too much of this medicine contact a poison control center or emergency room at once. NOTE: This medicine is only for you. Do not share this medicine with others. What if I miss a dose? If you miss a dose, take it as soon as you can. If it is almost time for your next dose, take only that dose. Do not take double or extra doses. What may interact with this medicine? Do not take this medicine with any of the following medications: -female hormones, like estrogens or progestins and birth control pills This medicine may also interact with the following medications: -tamoxifen This list may not describe all possible interactions. Give your health care provider a list  of all the medicines, herbs, non-prescription drugs, or dietary supplements you use. Also tell them if you smoke, drink alcohol, or use illegal drugs. Some items may interact with your medicine. What should I watch for while using this medicine? Visit your doctor or health care professional for regular checks on your progress. Let your doctor or health care professional know about any unusual vaginal bleeding. Do not treat yourself for diarrhea, nausea, vomiting or other side effects. Ask your doctor or health care professional for advice. What side effects may I notice from receiving this medicine? Side effects that you should report to your doctor or health care professional as soon as possible: -allergic reactions like skin rash, itching or hives, swelling of the face, lips, or tongue -any new or unusual symptoms -breathing problems -chest pain -leg pain or swelling -vomiting Side effects that usually do not require medical attention (report to your doctor or health care professional if they continue or are bothersome): -back or bone pain -cough, or throat infection -diarrhea or constipation -dizziness -headache -hot flashes -loss of appetite -nausea -sweating -weakness and tiredness -weight gain This list may not describe all possible side effects. Call your doctor for medical advice about side effects. You may report side effects to FDA at 1-800-FDA-1088. Where should I keep my medicine? Keep out of the reach of children. Store at room temperature between 20 and 25 degrees C (68 and 77 degrees F). Throw away any unused medicine after the expiration date. NOTE: This sheet is a summary. It may not cover  all possible information. If you have questions about this medicine, talk to your doctor, pharmacist, or health care provider.  2012, [employer]vier/[employer] Standard. (12/18/2007 4:31:52 PM)

## 2012-12-01 ENCOUNTER — Encounter: Payer: Self-pay | Admitting: Radiation Oncology

## 2012-12-01 ENCOUNTER — Ambulatory Visit
Admission: RE | Admit: 2012-12-01 | Discharge: 2012-12-01 | Disposition: A | Discharge status: Home / Self Care | Payer: PRIVATE HEALTH INSURANCE | Source: Ambulatory Visit | Attending: Radiation Oncology | Admitting: Radiation Oncology

## 2012-12-01 ENCOUNTER — Telehealth: Payer: Self-pay | Admitting: Nutrition

## 2012-12-01 VITALS — BP 96/56 | HR 71 | Resp 18 | Wt 134.2 lb

## 2012-12-01 DIAGNOSIS — C50919 Malignant neoplasm of unspecified site of unspecified female breast: Secondary | ICD-10-CM

## 2012-12-01 NOTE — Telephone Encounter (Signed)
I received a message that patient is requesting assistance in getting boost or Ensure. I called patient on the telephone who reports that she drinks approximately one oral nutrition supplement daily. She reports a poor appetite and says this helps her until she gets her appetite back. Her weight is stable. I educated patient briefly on strategies for increasing appetite. I will provide fact sheets and oral nutrition supplement samples as well as coupons for patient to pick up today when she comes in for her radiation treatment. I've also given her my contact information for further questions or concerns. Patient is appreciative.

## 2012-12-01 NOTE — Progress Notes (Signed)
Patient presents to the clinic today unaccompanied for PUT with Dr. Roselind Messier. Patient alert and oriented to person, place, and time. No distress noted. Steady gait noted. Pleasant affect noted. Patient denies pain at this time. However, patient does report occasional sharp shooting pain in her left breast but, understands this is related to healing from surgery. Patient denies skin changes to left/treated breast. Patient reports using radiaplex bid as directed. Patient denies fatigue. Patient reports that she "had to get an iron injection yesterday." Reported all findings to Dr. Roselind Messier.

## 2012-12-01 NOTE — Progress Notes (Signed)
[facility]    Radiation Oncology [address]     Maryln Gottron, M.D. [place], [place] [address]               Billie Lade, M.D., Ph.D. Phone: [phone]      Molli Hazard A. Kathrynn Running, M.D. Fax: [phone]      Radene Gunning, M.D., Ph.D.         Lurline Hare, M.D.         Grayland Jack, M.D Weekly Treatment Management Note  Name: [name]     MRN: [id number]        CSN: [id number] Date: 12/01/2012      DOB: 1931/12/11  CC: [name], MD         [name]    Status: Outpatient  Diagnosis: The encounter diagnosis was Breast cancer.  Current Dose: 30 Gy  Current Fraction: 12  Planned Dose: 50 Gy  Narrative: [name] was seen today for weekly treatment management. The chart was checked and port films  were reviewed. She continues to tolerate her treatments well at this time. She occasionally will have sharp shooting pain within the breast but no  consistent pain fatigue or itching.  Review of patient's allergies indicates no known allergies.  Current Outpatient Prescriptions  Medication Sig Dispense Refill  . allopurinol (ZYLOPRIM) 100 MG tablet Take 100 mg by mouth daily.       . Amlodipine-Valsartan-HCTZ (EXFORGE HCT) 5-160-12.5 MG TABS Take 1 tablet by mouth daily.      . calcium carbonate (OS-CAL) 600 MG TABS Take 600 mg by mouth daily.      . diclofenac sodium (VOLTAREN) 1 % GEL Apply 2 g topically 3 (three) times daily as needed. For arthritis pain      . FeFum-FePoly-FA-B Cmp-C-Biot (INTEGRA PLUS PO) Take 1 tablet by mouth daily.      . folic acid (FOLVITE) 400 MCG tablet Take 400-800 mcg by mouth daily.      [name] HYDROcodone-acetaminophen (NORCO/VICODIN) 5-325 MG per tablet Take 1 tablet by mouth Every 4 hours as needed.      . non-metallic deodorant Thornton Papas) MISC Apply 1 application topically daily as needed.      . Omega-3 Fatty Acids (FISH OIL) 500 MG CAPS Take 1 capsule by mouth daily.      . vitamin C (ASCORBIC ACID) 500 MG  tablet Take 500 mg by mouth daily.      . Wound Cleansers (RADIAPLEX EX) Apply topically.       No current facility-administered medications for this encounter.   Labs:  Physical Examination:  weight is 134 lb 3.2 oz (60.873 kg). Her blood pressure is 96/56 and her pulse is 71. Her respiration is 18.    Wt Readings from Last 3 Encounters:  12/01/12 134 lb 3.2 oz (60.873 kg)  11/30/12 136 lb (61.689 kg)  11/24/12 136 lb 8 oz (61.916 kg)    The left breast area shows some hyperpigmentation changes Lungs - Normal respiratory effort, chest expands symmetrically. Lungs are clear to auscultation, no crackles or wheezes.  Heart has regular rhythm and rate  Abdomen is soft and non tender with normal bowel sounds  Assessment:  Patient tolerating treatments well  Plan: Continue treatment per original radiation prescription

## 2012-12-02 ENCOUNTER — Ambulatory Visit
Admission: RE | Admit: 2012-12-02 | Discharge: 2012-12-02 | Disposition: A | Discharge status: Home / Self Care | Payer: PRIVATE HEALTH INSURANCE | Source: Ambulatory Visit | Attending: Radiation Oncology | Admitting: Radiation Oncology

## 2012-12-03 ENCOUNTER — Ambulatory Visit: Payer: PRIVATE HEALTH INSURANCE

## 2012-12-04 ENCOUNTER — Ambulatory Visit
Admission: RE | Admit: 2012-12-04 | Discharge: 2012-12-04 | Disposition: A | Discharge status: Home / Self Care | Payer: PRIVATE HEALTH INSURANCE | Source: Ambulatory Visit | Attending: Radiation Oncology | Admitting: Radiation Oncology

## 2012-12-07 ENCOUNTER — Ambulatory Visit
Admission: RE | Admit: 2012-12-07 | Discharge: 2012-12-07 | Disposition: A | Discharge status: Home / Self Care | Payer: PRIVATE HEALTH INSURANCE | Source: Ambulatory Visit | Attending: Radiation Oncology | Admitting: Radiation Oncology

## 2012-12-08 ENCOUNTER — Encounter: Payer: Self-pay | Admitting: Radiation Oncology

## 2012-12-08 ENCOUNTER — Ambulatory Visit
Admission: RE | Admit: 2012-12-08 | Discharge: 2012-12-08 | Disposition: A | Discharge status: Home / Self Care | Payer: PRIVATE HEALTH INSURANCE | Source: Ambulatory Visit | Attending: Radiation Oncology | Admitting: Radiation Oncology

## 2012-12-08 VITALS — BP 123/50 | HR 69 | Resp 18 | Wt 137.2 lb

## 2012-12-08 DIAGNOSIS — C50919 Malignant neoplasm of unspecified site of unspecified female breast: Secondary | ICD-10-CM

## 2012-12-08 NOTE — Progress Notes (Signed)
Patient presents to the clinic today unaccompanied for PUT with Dr. Roselind Messier. Patient is alert and oriented to person, place, and time. No distress noted. Steady gait noted. Pleasant affect noted. Patient denies pain at this time. Patient does report occasional left breast brief sharp shooting pain that resolve without medication but, the patient understand this is part of healing. Patient reports only faint hyperpigmentation without desquamation of the left breast. Patient reports using radiaplex bid as directed. Patient denies fatigue. Reported all findings to Dr. Roselind Messier.

## 2012-12-08 NOTE — Progress Notes (Signed)
[facility]    Radiation Oncology [address] [place]     Maryln Gottron, M.D. [place], [place] [address]               Billie Lade, M.D., Ph.D. Phone: [phone]      Molli Hazard A. Kathrynn Running, M.D. Fax: [phone]      Radene Gunning, M.D., Ph.D.         Lurline Hare, M.D.         Grayland Jack, M.D Weekly Treatment Management Note  Name: [name]     MRN: [id number]        CSN: [id number] Date: 12/08/2012      DOB: 1932/03/06  CC: Kelly Pitter, MD         [name]    Status: Outpatient  Diagnosis: The encounter diagnosis was Breast cancer.  Current Dose: 40 Gy  Current Fraction: 16  Planned Dose: 50 Gy  Narrative: [name] was seen today for weekly treatment management. The chart was checked and port films  were reviewed. She has some sensitivity in the breast but otherwise is doing well.  Review of patient's allergies indicates no known allergies.  Current Outpatient Prescriptions  Medication Sig Dispense Refill  . allopurinol (ZYLOPRIM) 100 MG tablet Take 100 mg by mouth daily.       . Amlodipine-Valsartan-HCTZ (EXFORGE HCT) 5-160-12.5 MG TABS Take 1 tablet by mouth daily.      . calcium carbonate (OS-CAL) 600 MG TABS Take 600 mg by mouth daily.      . diclofenac sodium (VOLTAREN) 1 % GEL Apply 2 g topically 3 (three) times daily as needed. For arthritis pain      . FeFum-FePoly-FA-B Cmp-C-Biot (INTEGRA PLUS PO) Take 1 tablet by mouth daily.      . folic acid (FOLVITE) 400 MCG tablet Take 400-800 mcg by mouth daily.      [name] HYDROcodone-acetaminophen (NORCO/VICODIN) 5-325 MG per tablet Take 1 tablet by mouth Every 4 hours as needed.      . non-metallic deodorant Thornton Papas) MISC Apply 1 application topically daily as needed.      . Omega-3 Fatty Acids (FISH OIL) 500 MG CAPS Take 1 capsule by mouth daily.      . vitamin C (ASCORBIC ACID) 500 MG tablet Take 500 mg by mouth daily.      . Wound Cleansers (RADIAPLEX EX) Apply topically.        No current facility-administered medications for this encounter.   Labs:  Lab Results  Component Value Date   WBC 4.9 11/30/2012   HGB 10.7* 11/30/2012   HCT 30.7* 11/30/2012   MCV 100.7 11/30/2012   PLT 200 11/30/2012   Lab Results  Component Value Date   CREATININE 1.36* 10/07/2012   BUN 22 10/07/2012   NA 140 10/07/2012   K 4.4 10/07/2012   CL 102 10/07/2012   CO2 26 10/07/2012   Lab Results  Component Value Date   ALT 39* 10/07/2012   AST 51* 10/07/2012   BILITOT 0.3 10/07/2012    Physical Examination:  weight is 137 lb 3.2 oz (62.234 kg). Her blood pressure is 123/50 and her pulse is 69. Her respiration is 18.    Wt Readings from Last 3 Encounters:  12/08/12 137 lb 3.2 oz (62.234 kg)  12/01/12 134 lb 3.2 oz (60.873 kg)  11/30/12 136 lb (61.689 kg)    The left breast area shows some hyperpigmentation changes without significant skin reaction. Lungs - Normal  respiratory effort, chest expands symmetrically. Lungs are clear to auscultation, no crackles or wheezes.  Heart has regular rhythm and rate  Abdomen is soft and non tender with normal bowel sounds  Assessment:  Patient tolerating treatments well  Plan: Continue treatment per original radiation prescription

## 2012-12-09 ENCOUNTER — Ambulatory Visit
Admission: RE | Admit: 2012-12-09 | Discharge: 2012-12-09 | Disposition: A | Discharge status: Home / Self Care | Payer: PRIVATE HEALTH INSURANCE | Source: Ambulatory Visit | Attending: Radiation Oncology | Admitting: Radiation Oncology

## 2012-12-09 ENCOUNTER — Ambulatory Visit: Payer: PRIVATE HEALTH INSURANCE

## 2012-12-09 DIAGNOSIS — C50919 Malignant neoplasm of unspecified site of unspecified female breast: Secondary | ICD-10-CM

## 2012-12-09 NOTE — Progress Notes (Signed)
  Radiation Oncology         [phone]) [phone] ________________________________  Name: [name] MRN: [id number]  Date: 12/09/2012  DOB: 10-Aug-1932  Simulation Verification Note  Status: outpatient  NARRATIVE: The patient was brought to the treatment unit and placed in the planned treatment position. The clinical setup was verified. Then port films were obtained and uploaded to the radiation oncology medical record software.  The treatment beams were carefully compared against the planned radiation fields. The position location and shape of the radiation fields was reviewed. They targeted volume of tissue appears to be appropriately covered by the radiation beams. Organs at risk appear to be excluded as planned.  Based on my personal review, I approved the simulation verification. The patient's treatment will proceed as planned.  -----------------------------------  Billie Lade, PhD, MD

## 2012-12-10 ENCOUNTER — Ambulatory Visit
Admission: RE | Admit: 2012-12-10 | Discharge: 2012-12-10 | Disposition: A | Discharge status: Home / Self Care | Payer: PRIVATE HEALTH INSURANCE | Source: Ambulatory Visit | Attending: Radiation Oncology | Admitting: Radiation Oncology

## 2012-12-10 ENCOUNTER — Ambulatory Visit: Payer: PRIVATE HEALTH INSURANCE

## 2012-12-11 ENCOUNTER — Ambulatory Visit: Payer: PRIVATE HEALTH INSURANCE

## 2012-12-11 ENCOUNTER — Ambulatory Visit
Admission: RE | Admit: 2012-12-11 | Discharge: 2012-12-11 | Disposition: A | Discharge status: Home / Self Care | Payer: PRIVATE HEALTH INSURANCE | Source: Ambulatory Visit | Attending: Radiation Oncology | Admitting: Radiation Oncology

## 2012-12-14 ENCOUNTER — Ambulatory Visit
Admission: RE | Admit: 2012-12-14 | Discharge: 2012-12-14 | Disposition: A | Discharge status: Home / Self Care | Payer: PRIVATE HEALTH INSURANCE | Source: Ambulatory Visit | Attending: Radiation Oncology | Admitting: Radiation Oncology

## 2012-12-14 ENCOUNTER — Encounter: Payer: Self-pay | Admitting: Radiation Oncology

## 2012-12-14 ENCOUNTER — Ambulatory Visit: Payer: PRIVATE HEALTH INSURANCE

## 2012-12-14 VITALS — BP 125/4 | HR 70 | Resp 18 | Wt 135.9 lb

## 2012-12-14 DIAGNOSIS — C50912 Malignant neoplasm of unspecified site of left female breast: Secondary | ICD-10-CM

## 2012-12-14 NOTE — Progress Notes (Signed)
  Radiation Oncology         [phone]) [phone] ________________________________  Name: [name] MRN: [id number]  Date: 12/14/2012  DOB: 1932/04/07  End of Treatment Note  Diagnosis:    Stage II-a invasive ductal carcinoma of the left breast    Indication for treatment:  Breast conservation therapy       Radiation treatment dates:   11/12/2012 through 12/15/2011  Site/dose:   Left breast 42.5 Gy in 17 fractions,  the site of presentation within the breast was boosted to 50 Gy  Beams/energy:   Tangential beams encompass in the left breast, boost field was with a photon 3 field beam arrangement using a combination of 6 and 10 MV photons  Narrative: The patient tolerated radiation treatment relatively well.   She had minimal discomfort in the breast and mild fatigue. She had minimal skin reaction in addition.  Plan: The patient has completed radiation treatment. The patient will return to radiation oncology clinic for routine followup in one month. I advised them to call or return sooner if they have any questions or concerns related to their recovery or treatment.  -----------------------------------  Billie Lade, PhD, MD

## 2012-12-14 NOTE — Progress Notes (Signed)
   Department of Radiation Oncology  Phone:  [phone] Fax:        [phone]  Simulation note  On 12/01/2012 the patient had set up of her boost field directed at the left breast area. The patient be treated with a 3 field photon beam arrangement using a combination of 6 and 10 MV photons. A computerized isodose plan was generated for treatment. Patient will receive 3 additional treatments for a cumulative dose of 50 gray.  -----------------------------------  Billie Lade, PhD, MD

## 2012-12-14 NOTE — Progress Notes (Signed)
Patient presents to the clinic today unaccompanied for PUT with Dr. Roselind Messier following final treatment. Patient is alert and oriented to person, place, and time. No distress noted. Steady gait noted. Pleasant affect noted. Patient denies pain at this time. Patient does report occasional left breast brief sharp shooting pain that resolve without medication but, the patient understand this is part of healing. Patient reports hyperpigmentation without desquamation of the left breast. Patient reports using radiaplex bid as directed. Patient denies fatigue. Provided patient with appointment card for one month follow up with Dr. Roselind Messier. Patient to see Dr. Welton Flakes 12/28/2012. Provided patient with and reviewed [facility] and [facility] flyer. Reported all findings to Dr. Roselind Messier.

## 2012-12-15 ENCOUNTER — Ambulatory Visit: Payer: PRIVATE HEALTH INSURANCE

## 2012-12-16 ENCOUNTER — Ambulatory Visit: Payer: PRIVATE HEALTH INSURANCE

## 2012-12-17 ENCOUNTER — Ambulatory Visit: Payer: PRIVATE HEALTH INSURANCE

## 2012-12-18 ENCOUNTER — Ambulatory Visit: Payer: PRIVATE HEALTH INSURANCE

## 2012-12-21 ENCOUNTER — Ambulatory Visit: Payer: PRIVATE HEALTH INSURANCE

## 2012-12-21 ENCOUNTER — Other Ambulatory Visit: Payer: Self-pay | Admitting: Medical Oncology

## 2012-12-21 ENCOUNTER — Telehealth: Payer: Self-pay | Admitting: *Deleted

## 2012-12-21 NOTE — Telephone Encounter (Signed)
Pt called for refill on Integra Plus.  S/w Dr. Milta Deiters nurse, Hale Drone, and she is working on it.

## 2012-12-21 NOTE — Telephone Encounter (Signed)
Pt LVMOM requesting refill of Integra Plus, Per Augustin Schooling, NP, order refilled, call to pt's pharmacy of choice, patient informed. Patient with no further questions. Expressed thanks.

## 2012-12-22 ENCOUNTER — Ambulatory Visit: Payer: PRIVATE HEALTH INSURANCE

## 2012-12-23 ENCOUNTER — Ambulatory Visit: Payer: PRIVATE HEALTH INSURANCE

## 2012-12-24 ENCOUNTER — Ambulatory Visit: Payer: PRIVATE HEALTH INSURANCE

## 2012-12-24 ENCOUNTER — Other Ambulatory Visit: Payer: PRIVATE HEALTH INSURANCE | Admitting: Lab

## 2012-12-25 ENCOUNTER — Ambulatory Visit: Payer: PRIVATE HEALTH INSURANCE

## 2012-12-28 ENCOUNTER — Encounter: Payer: Self-pay | Admitting: Adult Health

## 2012-12-28 ENCOUNTER — Telehealth: Payer: Self-pay | Admitting: Oncology

## 2012-12-28 ENCOUNTER — Other Ambulatory Visit (HOSPITAL_BASED_OUTPATIENT_CLINIC_OR_DEPARTMENT_OTHER): Payer: PRIVATE HEALTH INSURANCE | Admitting: Lab

## 2012-12-28 ENCOUNTER — Ambulatory Visit: Payer: PRIVATE HEALTH INSURANCE

## 2012-12-28 ENCOUNTER — Ambulatory Visit (HOSPITAL_BASED_OUTPATIENT_CLINIC_OR_DEPARTMENT_OTHER): Payer: PRIVATE HEALTH INSURANCE | Admitting: Adult Health

## 2012-12-28 VITALS — BP 122/68 | HR 76 | Temp 97.7°F | Resp 20 | Ht 63.0 in | Wt 134.1 lb

## 2012-12-28 DIAGNOSIS — D638 Anemia in other chronic diseases classified elsewhere: Secondary | ICD-10-CM

## 2012-12-28 DIAGNOSIS — D509 Iron deficiency anemia, unspecified: Secondary | ICD-10-CM

## 2012-12-28 DIAGNOSIS — M8580 Other specified disorders of bone density and structure, unspecified site: Secondary | ICD-10-CM

## 2012-12-28 DIAGNOSIS — C50912 Malignant neoplasm of unspecified site of left female breast: Secondary | ICD-10-CM

## 2012-12-28 DIAGNOSIS — C50219 Malignant neoplasm of upper-inner quadrant of unspecified female breast: Secondary | ICD-10-CM

## 2012-12-28 DIAGNOSIS — Z170 Estrogen receptor positive status [ER+]: Secondary | ICD-10-CM

## 2012-12-28 DIAGNOSIS — C50919 Malignant neoplasm of unspecified site of unspecified female breast: Secondary | ICD-10-CM

## 2012-12-28 LAB — CBC WITH DIFFERENTIAL/PLATELET
Basophils Absolute: 0.1 10*3/uL (ref 0.0–0.1)
EOS%: 4.9 % (ref 0.0–7.0)
Eosinophils Absolute: 0.2 10*3/uL (ref 0.0–0.5)
HCT: 36.7 % (ref 34.8–46.6)
HGB: 12.3 g/dL (ref 11.6–15.9)
MCH: 32.8 pg (ref 25.1–34.0)
MCV: 98 fL (ref 79.5–101.0)
MONO%: 15.5 % — ABNORMAL HIGH (ref 0.0–14.0)
NEUT%: 65.2 % (ref 38.4–76.8)
lymph#: 0.6 10*3/uL — ABNORMAL LOW (ref 0.9–3.3)

## 2012-12-28 LAB — COMPREHENSIVE METABOLIC PANEL (CC13)
ALT: 36 U/L (ref 0–55)
Albumin: 3.4 g/dL — ABNORMAL LOW (ref 3.5–5.0)
Alkaline Phosphatase: 116 U/L (ref 40–150)
Glucose: 101 mg/dl — ABNORMAL HIGH (ref 70–99)
Potassium: 4.5 mEq/L (ref 3.5–5.1)
Sodium: 139 mEq/L (ref 136–145)
Total Protein: 7.9 g/dL (ref 6.4–8.3)

## 2012-12-28 LAB — IRON AND TIBC: TIBC: 420 ug/dL (ref 250–470)

## 2012-12-28 LAB — FERRITIN: Ferritin: 94 ng/mL (ref 10–291)

## 2012-12-28 MED ORDER — ANASTROZOLE 1 MG PO TABS: 1.0000 mg | ORAL_TABLET | Freq: Every day | ORAL | Status: DC

## 2012-12-28 NOTE — Patient Instructions (Addendum)
Take Vitamin D3 1000 IU by mouth daily.    We have scheduled you for a bone density test.  Please call us if you have any questions or concerns.    Bone Densitometry Bone densitometry is a special X-ray that measures your bone density and can be used to help predict your risk of bone fractures. This test is used to determine bone mineral content and density to diagnose osteoporosis. Osteoporosis is the loss of bone that may cause the bone to become weak. Osteoporosis commonly occurs in women entering menopause. However, it may be found in men and in people with other diseases. PREPARATION FOR TEST No preparation necessary. WHO SHOULD BE TESTED?  All women older than 82.  Postmenopausal women (50 to 34) with risk factors for osteoporosis.  People with a previous fracture caused by normal activities.  People with a small body frame (less than 127 poundsor a body mass index [BMI] of less than 21).  People who have a parent with a hip fracture or history of osteoporosis.  People who smoke.  People who have rheumatoid arthritis.  Anyone who engages in excessive alcohol use (more than 3 drinks most days).  Women who experience early menopause. WHEN SHOULD YOU BE RETESTED? Current guidelines suggest that you should wait at least 2 years before doing a bone density test again if your first test was normal.Recent studies indicated that women with normal bone density may be able to wait a few years before needing to repeat a bone density test. You should discuss this with your caregiver.  NORMAL FINDINGS   Normal: less than standard deviation below normal (greater than -1).  Osteopenia: 1 to 2.5 standard deviations below normal (-1 to -2.5).  Osteoporosis: greater than 2.5 standard deviations below normal (less than -2.5). Test results are reported as a "T score" and a "Z score."The T score is a number that compares your bone density with the bone density of healthy, young women.The Z  score is a number that compares your bone density with the scores of women who are the same age, gender, and race.  Ranges for normal findings may vary among different laboratories and hospitals. You should always check with your doctor after having lab work or other tests done to discuss the meaning of your test results and whether your values are considered within normal limits. MEANING OF TEST  Your caregiver will go over the test results with you and discuss the importance and meaning of your results, as well as treatment options and the need for additional tests if necessary. OBTAINING THE TEST RESULTS It is your responsibility to obtain your test results. Ask the lab or department performing the test when and how you will get your results. Document Released: 10/29/2004 Document Revised: 12/30/2011 Document Reviewed: 11/21/2010 [facility] Patient Information 2013 [place], [place]. Anastrozole tablets What is this medicine? ANASTROZOLE (an AS troe zole) is used to treat breast cancer in women who have gone through menopause. Some types of breast cancer depend on estrogen to grow, and this medicine can stop tumor growth by blocking estrogen production. This medicine may be used for other purposes; ask your health care provider or pharmacist if you have questions. What should I tell my health care provider before I take this medicine? They need to know if you have any of these conditions: -liver disease -an unusual or allergic reaction to anastrozole, other medicines, foods, dyes, or preservatives -pregnant or trying to get pregnant -breast-feeding How should I use this  medicine? Take this medicine by mouth with a glass of water. Follow the directions on the prescription label. You can take this medicine with or without food. Take your doses at regular intervals. Do not take your medicine more often than directed. Do not stop taking except on the advice of your doctor or health care  professional. Talk to your pediatrician regarding the use of this medicine in children. Special care may be needed. Overdosage: If you think you have taken too much of this medicine contact a poison control center or emergency room at once. NOTE: This medicine is only for you. Do not share this medicine with others. What if I miss a dose? If you miss a dose, take it as soon as you can. If it is almost time for your next dose, take only that dose. Do not take double or extra doses. What may interact with this medicine? Do not take this medicine with any of the following medications: -female hormones, like estrogens or progestins and birth control pills This medicine may also interact with the following medications: -tamoxifen This list may not describe all possible interactions. Give your health care provider a list of all the medicines, herbs, non-prescription drugs, or dietary supplements you use. Also tell them if you smoke, drink alcohol, or use illegal drugs. Some items may interact with your medicine. What should I watch for while using this medicine? Visit your doctor or health care professional for regular checks on your progress. Let your doctor or health care professional know about any unusual vaginal bleeding. Do not treat yourself for diarrhea, nausea, vomiting or other side effects. Ask your doctor or health care professional for advice. What side effects may I notice from receiving this medicine? Side effects that you should report to your doctor or health care professional as soon as possible: -allergic reactions like skin rash, itching or hives, swelling of the face, lips, or tongue -any new or unusual symptoms -breathing problems -chest pain -leg pain or swelling -vomiting Side effects that usually do not require medical attention (report to your doctor or health care professional if they continue or are bothersome): -back or bone pain -cough, or throat infection -diarrhea  or constipation -dizziness -headache -hot flashes -loss of appetite -nausea -sweating -weakness and tiredness -weight gain This list may not describe all possible side effects. Call your doctor for medical advice about side effects. You may report side effects to FDA at 1-800-FDA-1088. Where should I keep my medicine? Keep out of the reach of children. Store at room temperature between 20 and 25 degrees C (68 and 77 degrees F). Throw away any unused medicine after the expiration date. NOTE: This sheet is a summary. It may not cover all possible information. If you have questions about this medicine, talk to your doctor, pharmacist, or health care provider.  2012, [employer]vier/Gold Standard. (12/18/2007 4:31:52 PM)

## 2012-12-28 NOTE — Patient Instructions (Signed)
Hgb 12.3.  Aranesp parameters not met.

## 2012-12-28 NOTE — Telephone Encounter (Signed)
, °

## 2012-12-28 NOTE — Progress Notes (Signed)
OFFICE PROGRESS NOTE  CC Dr. Renaye Rakers  DIAGNOSIS: Anemia of chronic disease, renal insufficiency and iron deficiency, Breast cancer.  PRIOR THERAPY: #1 Aranesp injections every 4 weeks and iron as indicated.   #2 mammogram and ultrasound guided core biopsy of the left breast. Prognostic markers showed invasive ductal carcinoma grade 1 or 2 ER positive PR positive HER-2/neu negative Ki-67 around 14%.  #3 Left partial mastectomy and sentinel node biopsy on 10/08/12, 2.1cm invasive ductal carcinoma, all three sentinel nodes were negatve.  Posterior margin had to be re-excised.    #4 Radiation therapy from 1/14 to 12/14/12  #5 Arimidex daily beginning 12/28/12  CURRENT THERAPY: Aranesp injections every 4 weeks and iron as indicated. Arimidex daily  INTERVAL HISTORY:  [name] is doing well today.  She has completed her radiation therapy and her skin changes are improving.  She denies fatigue, fevers, chills, or any other concerns.  We updated her health maintenance information below.    ALLERGIES:  None Current Outpatient Prescriptions  Medication Sig Dispense Refill  . allopurinol (ZYLOPRIM) 100 MG tablet Take 100 mg by mouth daily.       . Amlodipine-Valsartan-HCTZ (EXFORGE HCT) 5-160-12.5 MG TABS Take 1 tablet by mouth daily.      . calcium carbonate (OS-CAL) 600 MG TABS Take 600 mg by mouth daily.      . diclofenac sodium (VOLTAREN) 1 % GEL Apply 2 g topically 3 (three) times daily as needed. For arthritis pain      . FeFum-FePoly-FA-B Cmp-C-Biot (INTEGRA PLUS PO) Take 1 tablet by mouth daily.      . folic acid (FOLVITE) 400 MCG tablet Take 400-800 mcg by mouth daily.      [name] HYDROcodone-acetaminophen (NORCO/VICODIN) 5-325 MG per tablet Take 1 tablet by mouth Every 4 hours as needed.      . non-metallic deodorant Thornton Papas) MISC Apply 1 application topically daily as needed.      . Omega-3 Fatty Acids (FISH OIL) 500 MG CAPS Take 1 capsule by mouth daily.      . vitamin C  (ASCORBIC ACID) 500 MG tablet Take 500 mg by mouth daily.      . Wound Cleansers (RADIAPLEX EX) Apply topically.      [name] anastrozole (ARIMIDEX) 1 MG tablet Take 1 tablet (1 mg total) by mouth daily.  30 tablet  2   No current facility-administered medications for this visit.    Health Maintenance  Mammogram: 06/2012 Colonoscopy: 2009 Bone Density Scan: 2 years ago Pap Smear: s/p TAH/BSO Eye Exam: 12/2011 Vitamin D Level: pending Lipid Panel: 06/2012  ROS:  General: fatigue (-), night sweats (-), fever (-), pain (-) Lymph: palpable nodes (-) HEENT: vision changes (-), mucositis (-), gum bleeding (-), epistaxis (-) Cardiovascular: chest pain (-), palpitations (-) Pulmonary: shortness of breath (-), dyspnea on exertion (-), cough (-), hemoptysis (-) GI:  Early satiety (-), melena (-), dysphagia (-), nausea/vomiting (-), diarrhea (-) GU: dysuria (-), hematuria (-), incontinence (-) Musculoskeletal: joint swelling (-), joint pain (-), back pain (-) Neuro: weakness (-), numbness (-), headache (-), confusion (-) Skin: Rash (-), lesions (-), dryness (-) Psych: depression (-), suicidal/homicidal ideation (-), feeling of hopelessness (-)   PHYSICAL EXAMINATION:  BP 122/68  Pulse 76  Temp(Src) 97.7 F (36.5 C) (Oral)  Resp 20  Ht 5\' 3"  (1.6 m)  Wt 134 lb 1.6 oz (60.827 kg)  BMI 23.76 kg/m2. General: Well developed, well nourished, in no acute distress.  EENT: No ocular or oral  lesions. No stomatitis.  Respiratory: Lungs are clear to auscultation bilaterally with normal respiratory movement and no accessory muscle use. Cardiac: No murmur, rub or tachycardia. No upper or lower extremity edema.  GI: Abdomen is soft, no palpable hepatosplenomegaly. No fluid wave. No tenderness. Musculoskeletal: No kyphosis, no tenderness over the spine, ribs or hips. Lymph: No cervical, infraclavicular, axillary or inguinal adenopathy. Neuro: No focal neurological deficits. Psych: Alert and oriented X  3, appropriate mood and affect.  Breast: left breast with radiation changes, no erythema, lumpectomy site well healed with no nodularity.  Right breast: no nodularity or masses. ECOG PERFORMANCE STATUS: 0 - Asymptomatic    LABORATORY DATA: Lab Results  Component Value Date   WBC 4.4 12/28/2012   HGB 12.3 12/28/2012   HCT 36.7 12/28/2012   MCV 98.0 12/28/2012   PLT 198 12/28/2012      Chemistry      Component Value Date/Time   NA 139 12/28/2012 1059   NA 140 10/07/2012 1000   NA 138 04/12/2010 0954   K 4.5 12/28/2012 1059   K 4.4 10/07/2012 1000   K 4.8* 04/12/2010 0954   CL 105 12/28/2012 1059   CL 102 10/07/2012 1000   CL 101 04/12/2010 0954   CO2 26 12/28/2012 1059   CO2 26 10/07/2012 1000   CO2 26 04/12/2010 0954   BUN 19.1 12/28/2012 1059   BUN 22 10/07/2012 1000   BUN 14 04/12/2010 0954   CREATININE 1.4* 12/28/2012 1059   CREATININE 1.36* 10/07/2012 1000   CREATININE 1.2 04/12/2010 0954      Component Value Date/Time   CALCIUM 10.2 12/28/2012 1059   CALCIUM 10.3 10/07/2012 1000   CALCIUM 9.8 04/12/2010 0954   ALKPHOS 116 12/28/2012 1059   ALKPHOS 113 10/07/2012 1000   ALKPHOS 106* 04/12/2010 0954   AST 46* 12/28/2012 1059   AST 51* 10/07/2012 1000   AST 80* 04/12/2010 0954   ALT 36 12/28/2012 1059   ALT 39* 10/07/2012 1000   BILITOT 0.33 12/28/2012 1059   BILITOT 0.3 10/07/2012 1000   BILITOT 0.50 04/12/2010 0954      ASSESSMENT: 79 year old female with  #1 history of anemia of chronic disease and iron deficiency patient has been receiving Aranesp injections.  #2 new diagnosis of breast cancer in the left breast that is ER positive HER-2/neu negative. Awaiting lumpectomy.  PLAN:  #1 Ms. [name] is doing well today.  She has completed her radiation.  We will start her on Arimidex.  I have discussed this medication with her in detail.  She will have a DEXA scan done in the next 2 weeks.    #2 her H&H is good today.  No aranesp needed.    #3 She will continue with labs  every 4 weeks and Aranesp if indicated, We will see her back in 3 months to evaluate how she's tolerating the Arimidex.     All questions were answered. The patient knows to call the clinic with any problems, questions or concerns. The length of time of the face-to-face encounter was 30  minutes. More than 50% of time was spent counseling and coordination of care.  Cherie Ouch Lyn Hollingshead, NP Medical Oncology [facility] Phone: [phone]  12/28/2012, 1:13 PM

## 2012-12-28 NOTE — Progress Notes (Signed)
Hgb 12.3.  Aranesp parameters not met.   

## 2013-01-07 ENCOUNTER — Other Ambulatory Visit: Payer: PRIVATE HEALTH INSURANCE

## 2013-01-12 ENCOUNTER — Encounter: Payer: Self-pay | Admitting: Radiation Oncology

## 2013-01-14 ENCOUNTER — Ambulatory Visit
Admission: RE | Admit: 2013-01-14 | Discharge: 2013-01-14 | Disposition: A | Discharge status: Home / Self Care | Payer: PRIVATE HEALTH INSURANCE | Source: Ambulatory Visit | Attending: Radiation Oncology | Admitting: Radiation Oncology

## 2013-01-14 ENCOUNTER — Encounter: Payer: Self-pay | Admitting: Radiation Oncology

## 2013-01-14 VITALS — BP 150/47 | HR 91 | Temp 97.7°F | Wt 136.5 lb

## 2013-01-14 DIAGNOSIS — C50912 Malignant neoplasm of unspecified site of left female breast: Secondary | ICD-10-CM

## 2013-01-14 DIAGNOSIS — C50919 Malignant neoplasm of unspecified site of unspecified female breast: Secondary | ICD-10-CM | POA: Insufficient documentation

## 2013-01-14 DIAGNOSIS — Z79899 Other long term (current) drug therapy: Secondary | ICD-10-CM | POA: Insufficient documentation

## 2013-01-14 MED ORDER — RADIAPLEXRX EX GEL
Freq: Once | CUTANEOUS | Status: AC
  Administered 2013-01-14: 1 via TOPICAL

## 2013-01-14 NOTE — Progress Notes (Signed)
Ms. Rigdon here for follow up after 17 fractions to her left breast.  She is having pain that she rates at a 6 on a 0-10 scale in her right hip that wraps around.  She also states that she had blood in her stool yesterday.  She is feeling very fatigued and weak, almost like the flu.  She is also coughing and has a headache.  She is wondering if these are side effects from Arimidex that she started taking the middle of March.  Her skin is intact on her left breast and she does have some hyperpigmentation.  She is using Radiaplex gel and requested a refill.

## 2013-01-14 NOTE — Addendum Note (Signed)
Encounter addended by: Eduardo Osier, RN on: 01/14/2013 10:21 AM<BR>     Documentation filed: Inpatient MAR

## 2013-01-14 NOTE — Addendum Note (Signed)
Encounter addended by: Eduardo Osier, RN on: 01/14/2013 10:18 AM<BR>     Documentation filed: Orders

## 2013-01-14 NOTE — Progress Notes (Signed)
Radiation Oncology         [phone]) [phone] ________________________________  Name: [name] MRN: [id number]  Date: 01/14/2013  DOB: 05-27-1932  Follow-Up Visit Note  CC: [name], MD  Ernestene Mention, MD  Diagnosis:   Stage II a invasive ductal carcinoma of the left breast  Interval Since Last Radiation:  4  weeks  Narrative:  The patient returns today for routine follow-up.   The patient did start Arimidex and seems to be having some side effects related to this medication. She will see medical oncology in the next couple weeks for  Evaluation of this issue. She will occasionally have sharp shooting pains within the left breast but no consistent pain. She denies any nipple discharge or bleeding. She denies any problems with swelling in her left arm or hand.                            ALLERGIES:  has No Known Allergies.  Meds: Current Outpatient Prescriptions  Medication Sig Dispense Refill  . Amlodipine-Valsartan-HCTZ (EXFORGE HCT) 5-160-12.5 MG TABS Take 1 tablet by mouth daily.      [name] anastrozole (ARIMIDEX) 1 MG tablet Take 1 tablet (1 mg total) by mouth daily.  30 tablet  2  . calcium carbonate (OS-CAL) 600 MG TABS Take 600 mg by mouth daily.      . Cholecalciferol (VITAMIN D-3) 1000 UNITS CAPS Take 1,000 Units by mouth daily.      . diclofenac sodium (VOLTAREN) 1 % GEL Apply 2 g topically 3 (three) times daily as needed. For arthritis pain      . FeFum-FePoly-FA-B Cmp-C-Biot (INTEGRA PLUS PO) Take 1 tablet by mouth daily.      . folic acid (FOLVITE) 400 MCG tablet Take 400-800 mcg by mouth daily.      . Omega-3 Fatty Acids (FISH OIL) 500 MG CAPS Take 1 capsule by mouth daily.      . vitamin C (ASCORBIC ACID) 500 MG tablet Take 500 mg by mouth daily.      . Wound Cleansers (RADIAPLEX EX) Apply topically.      [name] allopurinol (ZYLOPRIM) 100 MG tablet Take 100 mg by mouth daily.       [name] HYDROcodone-acetaminophen (NORCO/VICODIN) 5-325 MG per tablet Take 1 tablet by mouth  Every 4 hours as needed.      . non-metallic deodorant Thornton Papas) MISC Apply 1 application topically daily as needed.       No current facility-administered medications for this encounter.    Physical Findings: The patient is in no acute distress. Patient is alert and oriented.  weight is 136 lb 8 oz (61.916 kg). Her temperature is 97.7 F (36.5 C). Her blood pressure is 150/47 and her pulse is 91. .  The lungs are clear. The heart has a regular rhythm and rate. There is no palpable supraclavicular or axillary adenopathy. Examination right breast reveals no mass or nipple discharge. Examination of the left breast reveals hyperpigmentation changes throughout. The patient's skin is well healed at this time. There is some induration at the lumpectomy scar but no suspicious mass.. There is no nipple discharge or bleeding.   Lab Findings: Lab Results  Component Value Date   WBC 4.4 12/28/2012   HGB 12.3 12/28/2012   HCT 36.7 12/28/2012   MCV 98.0 12/28/2012   PLT 198 12/28/2012      Impression:  The patient is recovering from the effects of radiation.  No evidence of recurrence on exam today. When necessary followup in radiation oncology. The patient will continue close followup in medical oncology with continued  hormonal therapy.     _____________________________________  -----------------------------------  Billie Lade, PhD, MD

## 2013-01-20 ENCOUNTER — Other Ambulatory Visit: Payer: Self-pay | Admitting: Medical Oncology

## 2013-01-20 DIAGNOSIS — D638 Anemia in other chronic diseases classified elsewhere: Secondary | ICD-10-CM

## 2013-01-21 ENCOUNTER — Ambulatory Visit (HOSPITAL_BASED_OUTPATIENT_CLINIC_OR_DEPARTMENT_OTHER): Payer: PRIVATE HEALTH INSURANCE

## 2013-01-21 ENCOUNTER — Other Ambulatory Visit (HOSPITAL_BASED_OUTPATIENT_CLINIC_OR_DEPARTMENT_OTHER): Payer: PRIVATE HEALTH INSURANCE | Admitting: Lab

## 2013-01-21 VITALS — BP 122/52 | HR 76 | Temp 98.4°F

## 2013-01-21 DIAGNOSIS — D638 Anemia in other chronic diseases classified elsewhere: Secondary | ICD-10-CM

## 2013-01-21 DIAGNOSIS — N289 Disorder of kidney and ureter, unspecified: Secondary | ICD-10-CM

## 2013-01-21 LAB — CBC WITH DIFFERENTIAL/PLATELET
BASO%: 0.5 % (ref 0.0–2.0)
EOS%: 4.3 % (ref 0.0–7.0)
Eosinophils Absolute: 0.3 10*3/uL (ref 0.0–0.5)
LYMPH%: 11.2 % — ABNORMAL LOW (ref 14.0–49.7)
MCH: 32.8 pg (ref 25.1–34.0)
MCHC: 34.2 g/dL (ref 31.5–36.0)
MCV: 96 fL (ref 79.5–101.0)
MONO%: 13.5 % (ref 0.0–14.0)
NEUT#: 4.3 10*3/uL (ref 1.5–6.5)
Platelets: 281 10*3/uL (ref 145–400)
RBC: 2.82 10*6/uL — ABNORMAL LOW (ref 3.70–5.45)
RDW: 12.8 % (ref 11.2–14.5)

## 2013-01-21 MED ORDER — DARBEPOETIN ALFA-POLYSORBATE 300 MCG/0.6ML IJ SOLN
300.0000 ug | Freq: Once | INTRAMUSCULAR | Status: AC
  Administered 2013-01-21: 300 ug via SUBCUTANEOUS
  Filled 2013-01-21: qty 0.6

## 2013-02-17 ENCOUNTER — Other Ambulatory Visit: Payer: Self-pay | Admitting: Medical Oncology

## 2013-02-17 DIAGNOSIS — C50912 Malignant neoplasm of unspecified site of left female breast: Secondary | ICD-10-CM

## 2013-02-18 ENCOUNTER — Other Ambulatory Visit (HOSPITAL_BASED_OUTPATIENT_CLINIC_OR_DEPARTMENT_OTHER): Payer: PRIVATE HEALTH INSURANCE

## 2013-02-18 ENCOUNTER — Ambulatory Visit: Payer: PRIVATE HEALTH INSURANCE

## 2013-02-18 DIAGNOSIS — C50919 Malignant neoplasm of unspecified site of unspecified female breast: Secondary | ICD-10-CM

## 2013-02-18 DIAGNOSIS — D638 Anemia in other chronic diseases classified elsewhere: Secondary | ICD-10-CM

## 2013-02-18 DIAGNOSIS — C50912 Malignant neoplasm of unspecified site of left female breast: Secondary | ICD-10-CM

## 2013-02-18 LAB — CBC WITH DIFFERENTIAL/PLATELET
BASO%: 1.2 % (ref 0.0–2.0)
Eosinophils Absolute: 0.1 10*3/uL (ref 0.0–0.5)
LYMPH%: 19.8 % (ref 14.0–49.7)
MCHC: 33.4 g/dL (ref 31.5–36.0)
MONO#: 0.7 10*3/uL (ref 0.1–0.9)
NEUT#: 2.9 10*3/uL (ref 1.5–6.5)
RBC: 3.66 10*6/uL — ABNORMAL LOW (ref 3.70–5.45)
RDW: 14 % (ref 11.2–14.5)
WBC: 4.6 10*3/uL (ref 3.9–10.3)
lymph#: 0.9 10*3/uL (ref 0.9–3.3)

## 2013-02-18 MED ORDER — DARBEPOETIN ALFA-POLYSORBATE 500 MCG/ML IJ SOLN: 300.0000 ug | Freq: Once | INTRAMUSCULAR | Status: DC

## 2013-02-22 ENCOUNTER — Telehealth: Payer: Self-pay | Admitting: Medical Oncology

## 2013-02-22 NOTE — Telephone Encounter (Signed)
Pt LVMOM requesting refill of Integra Plus.   L.o.v. With Augustin Schooling, NP 03/10  Next sched appt 05/29 lab/aranesp inj, 06/26 lab/MD/aranesp inj.

## 2013-02-22 NOTE — Telephone Encounter (Signed)
Ok to refill 

## 2013-02-23 ENCOUNTER — Other Ambulatory Visit: Payer: Self-pay | Admitting: Medical Oncology

## 2013-02-23 ENCOUNTER — Other Ambulatory Visit: Payer: Self-pay | Admitting: *Deleted

## 2013-02-23 MED ORDER — INTEGRA PLUS PO CAPS: 1.0000 | ORAL_CAPSULE | Freq: Every day | ORAL | Status: DC

## 2013-02-23 NOTE — Telephone Encounter (Signed)
Error in previous escribe, second attempt

## 2013-02-23 NOTE — Telephone Encounter (Signed)
Per MD, patient informed refill for Integra plus sent to her pharmacy. Pt with no further questions, expressed thanks.

## 2013-03-11 ENCOUNTER — Encounter (INDEPENDENT_AMBULATORY_CARE_PROVIDER_SITE_OTHER): Payer: Self-pay | Admitting: General Surgery

## 2013-03-11 ENCOUNTER — Ambulatory Visit (INDEPENDENT_AMBULATORY_CARE_PROVIDER_SITE_OTHER): Payer: PRIVATE HEALTH INSURANCE | Admitting: General Surgery

## 2013-03-11 VITALS — BP 128/62 | HR 76 | Temp 97.0°F | Resp 18 | Ht 62.0 in | Wt 135.2 lb

## 2013-03-11 DIAGNOSIS — C50912 Malignant neoplasm of unspecified site of left female breast: Secondary | ICD-10-CM

## 2013-03-11 DIAGNOSIS — C50919 Malignant neoplasm of unspecified site of unspecified female breast: Secondary | ICD-10-CM

## 2013-03-11 NOTE — Patient Instructions (Signed)
Examination of your breast and all of the lymph nodes is normal. There is no evidence of cancer.  Continue to take the arimidex and keep your regular appointment with Dr. Drue Second  You should get bilateral mammograms in December of 2014.  Return to see Dr. Derrell Lolling in January 2015.

## 2013-03-11 NOTE — Progress Notes (Signed)
Patient ID: [name], female   DOB: [birth year], 79 y.o.   MRN: [id number] History: This patient returns for long-term followup regarding her left breast cancer. This patient underwent left partial mastectomy and sentinel node biopsy on 10/08/2012. She had a 2.1 cm invasive ductal carcinoma, all 3 sentinel nodes were negative. I had to re excise the posterior margin. Final pathology report shows negative margins. Pathologic stage T2, N0, ER 100%, PR 13%, HER-2-negative, Ki 67 14%.  She is feeling fine and has no complaints about her breast.  She completed her radiation therapy in February. She is on arimidex and tolerating that well. She is followed by Dr. Drue Second  ROS: 10 system review of systems is performed and is negative except as described above.  Exam: Patient alert. Friendly. No distress. Neck no adenopathy or mass. Lungs clear to auscultation bilaterally Heart regular rate and rhythm, no murmur, no ectopy This but both breasts examined. Healed transverse incision superior left breast and left axilla. No wound problems. No breast masses. No axillary adenopathy on either side. Skin healthy.  Assessment: Invasive carcinoma left breast, pathologic stage T2, N0, receptor positive, HER-2-negative, Ki 67 14% Uneventful recovery following the partial mastectomy, reexcision posterior margin, sentinel lymph node biopsy, adjuvant radiation therapy  Plan: Continue arimidex Bilateral mammograms this fall and  return to see me in Dec./Jan.  after mammograms are completed.   Angelia Mould. Derrell Lolling, M.D., [facility] General and Minimally invasive Surgery Breast and Colorectal Surgery Office:   [phone] Pager:   [phone]

## 2013-03-11 NOTE — Addendum Note (Signed)
Encounter addended by: Eduardo Osier, RN on: 03/11/2013 12:03 PM<BR>     Documentation filed: Charges VN

## 2013-03-17 ENCOUNTER — Other Ambulatory Visit: Payer: Self-pay | Admitting: Emergency Medicine

## 2013-03-17 DIAGNOSIS — D638 Anemia in other chronic diseases classified elsewhere: Secondary | ICD-10-CM

## 2013-03-18 ENCOUNTER — Ambulatory Visit (HOSPITAL_BASED_OUTPATIENT_CLINIC_OR_DEPARTMENT_OTHER): Payer: PRIVATE HEALTH INSURANCE

## 2013-03-18 ENCOUNTER — Other Ambulatory Visit (HOSPITAL_BASED_OUTPATIENT_CLINIC_OR_DEPARTMENT_OTHER): Payer: PRIVATE HEALTH INSURANCE | Admitting: Lab

## 2013-03-18 VITALS — BP 137/42 | HR 73 | Temp 98.1°F

## 2013-03-18 DIAGNOSIS — D638 Anemia in other chronic diseases classified elsewhere: Secondary | ICD-10-CM

## 2013-03-18 DIAGNOSIS — D631 Anemia in chronic kidney disease: Secondary | ICD-10-CM

## 2013-03-18 DIAGNOSIS — N189 Chronic kidney disease, unspecified: Secondary | ICD-10-CM

## 2013-03-18 LAB — CBC WITH DIFFERENTIAL/PLATELET
Basophils Absolute: 0 10*3/uL (ref 0.0–0.1)
EOS%: 3.4 % (ref 0.0–7.0)
HCT: 29.6 % — ABNORMAL LOW (ref 34.8–46.6)
MCHC: 33.5 g/dL (ref 31.5–36.0)
MONO%: 13.2 % (ref 0.0–14.0)
NEUT#: 2.8 10*3/uL (ref 1.5–6.5)
Platelets: 218 10*3/uL (ref 145–400)
RBC: 3.09 10*6/uL — ABNORMAL LOW (ref 3.70–5.45)
RDW: 15.1 % — ABNORMAL HIGH (ref 11.2–14.5)
WBC: 4.4 10*3/uL (ref 3.9–10.3)

## 2013-03-18 MED ORDER — DARBEPOETIN ALFA-POLYSORBATE 300 MCG/0.6ML IJ SOLN
300.0000 ug | Freq: Once | INTRAMUSCULAR | Status: AC
  Administered 2013-03-18: 300 ug via SUBCUTANEOUS
  Filled 2013-03-18: qty 0.6

## 2013-03-22 ENCOUNTER — Other Ambulatory Visit: Payer: Self-pay | Admitting: *Deleted

## 2013-03-22 DIAGNOSIS — C50912 Malignant neoplasm of unspecified site of left female breast: Secondary | ICD-10-CM

## 2013-03-22 MED ORDER — ANASTROZOLE 1 MG PO TABS: 1.0000 mg | ORAL_TABLET | Freq: Every day | ORAL | Status: DC

## 2013-04-14 ENCOUNTER — Other Ambulatory Visit: Payer: Self-pay | Admitting: Medical Oncology

## 2013-04-14 DIAGNOSIS — D638 Anemia in other chronic diseases classified elsewhere: Secondary | ICD-10-CM

## 2013-04-15 ENCOUNTER — Encounter: Payer: Self-pay | Admitting: Oncology

## 2013-04-15 ENCOUNTER — Encounter: Payer: Self-pay | Admitting: *Deleted

## 2013-04-15 ENCOUNTER — Ambulatory Visit: Payer: PRIVATE HEALTH INSURANCE

## 2013-04-15 ENCOUNTER — Ambulatory Visit (HOSPITAL_BASED_OUTPATIENT_CLINIC_OR_DEPARTMENT_OTHER): Payer: PRIVATE HEALTH INSURANCE | Admitting: Oncology

## 2013-04-15 ENCOUNTER — Telehealth: Payer: Self-pay | Admitting: Oncology

## 2013-04-15 ENCOUNTER — Other Ambulatory Visit (HOSPITAL_BASED_OUTPATIENT_CLINIC_OR_DEPARTMENT_OTHER): Payer: PRIVATE HEALTH INSURANCE | Admitting: Lab

## 2013-04-15 VITALS — BP 124/67 | HR 72 | Temp 98.3°F | Resp 20 | Ht 62.0 in | Wt 132.6 lb

## 2013-04-15 DIAGNOSIS — M8580 Other specified disorders of bone density and structure, unspecified site: Secondary | ICD-10-CM

## 2013-04-15 DIAGNOSIS — M899 Disorder of bone, unspecified: Secondary | ICD-10-CM

## 2013-04-15 DIAGNOSIS — C50919 Malignant neoplasm of unspecified site of unspecified female breast: Secondary | ICD-10-CM

## 2013-04-15 DIAGNOSIS — C50912 Malignant neoplasm of unspecified site of left female breast: Secondary | ICD-10-CM

## 2013-04-15 DIAGNOSIS — D638 Anemia in other chronic diseases classified elsewhere: Secondary | ICD-10-CM

## 2013-04-15 DIAGNOSIS — E559 Vitamin D deficiency, unspecified: Secondary | ICD-10-CM

## 2013-04-15 LAB — CBC WITH DIFFERENTIAL/PLATELET
BASO%: 0.4 % (ref 0.0–2.0)
Basophils Absolute: 0 10*3/uL (ref 0.0–0.1)
HCT: 36 % (ref 34.8–46.6)
HGB: 11.8 g/dL (ref 11.6–15.9)
MONO#: 0.7 10*3/uL (ref 0.1–0.9)
NEUT%: 58.7 % (ref 38.4–76.8)
WBC: 4.6 10*3/uL (ref 3.9–10.3)
lymph#: 1.1 10*3/uL (ref 0.9–3.3)

## 2013-04-15 MED ORDER — ANASTROZOLE 1 MG PO TABS: 1.0000 mg | ORAL_TABLET | Freq: Every day | ORAL | Status: DC

## 2013-04-15 NOTE — Telephone Encounter (Signed)
, °

## 2013-04-15 NOTE — Progress Notes (Signed)
No need for Aranesp today per Dr. Welton Flakes.  HGB-11.8.

## 2013-04-15 NOTE — Patient Instructions (Addendum)
Doing well  Continue taking arimidex 1 mg daily  I have ordered a bone density scan  Continue aranesp injections every 4 weeks  I will see you baack in 6 months

## 2013-04-30 ENCOUNTER — Other Ambulatory Visit: Payer: PRIVATE HEALTH INSURANCE

## 2013-05-02 NOTE — Progress Notes (Signed)
OFFICE PROGRESS NOTE  CC Dr. Renaye Rakers  DIAGNOSIS: Anemia of chronic disease, renal insufficiency and iron deficiency, Breast cancer.  PRIOR THERAPY: #1 Aranesp injections every 4 weeks and iron as indicated.   #2 mammogram and ultrasound guided core biopsy of the left breast. Prognostic markers showed invasive ductal carcinoma grade 1 or 2 ER positive PR positive HER-2/neu negative Ki-67 around 14%.  #3 Left partial mastectomy and sentinel node biopsy on 10/08/12, 2.1cm invasive ductal carcinoma, all three sentinel nodes were negatve.  Posterior margin had to be re-excised.    #4 Radiation therapy from 1/14 to 12/14/12  #5 Arimidex daily beginning 12/28/12  CURRENT THERAPY: Aranesp injections every 4 weeks and iron as indicated. Arimidex daily  INTERVAL HISTORY:  [name] is doing well today.    She denies fatigue, fevers, chills, or any other concerns.  We updated her health maintenance information below.    ALLERGIES:  None Current Outpatient Prescriptions  Medication Sig Dispense Refill  . allopurinol (ZYLOPRIM) 100 MG tablet Take 100 mg by mouth daily.       . Amlodipine-Valsartan-HCTZ (EXFORGE HCT) 5-160-12.5 MG TABS Take 1 tablet by mouth daily.      [name] anastrozole (ARIMIDEX) 1 MG tablet Take 1 tablet (1 mg total) by mouth daily.  30 tablet  12  . calcium carbonate (OS-CAL) 600 MG TABS Take 600 mg by mouth daily.      . Cholecalciferol (VITAMIN D-3) 1000 UNITS CAPS Take 1,000 Units by mouth daily.      . diclofenac sodium (VOLTAREN) 1 % GEL Apply 2 g topically 3 (three) times daily as needed. For arthritis pain      . FeFum-FePoly-FA-B Cmp-C-Biot (INTEGRA PLUS) CAPS Take 1 capsule by mouth daily.  30 capsule  3  . folic acid (FOLVITE) 400 MCG tablet Take 400-800 mcg by mouth daily.      . non-metallic deodorant Thornton Papas) MISC Apply 1 application topically daily as needed.      . Omega-3 Fatty Acids (FISH OIL) 500 MG CAPS Take 1 capsule by mouth daily.      . vitamin C  (ASCORBIC ACID) 500 MG tablet Take 500 mg by mouth daily.      . Wound Cleansers (RADIAPLEX EX) Apply topically.       No current facility-administered medications for this visit.    Health Maintenance  Mammogram: 06/2012 Colonoscopy: 2009 Bone Density Scan: 2 years ago Pap Smear: s/p TAH/BSO Eye Exam: 12/2011 Vitamin D Level: pending Lipid Panel: 06/2012  ROS:  General: fatigue (-), night sweats (-), fever (-), pain (-) Lymph: palpable nodes (-) HEENT: vision changes (-), mucositis (-), gum bleeding (-), epistaxis (-) Cardiovascular: chest pain (-), palpitations (-) Pulmonary: shortness of breath (-), dyspnea on exertion (-), cough (-), hemoptysis (-) GI:  Early satiety (-), melena (-), dysphagia (-), nausea/vomiting (-), diarrhea (-) GU: dysuria (-), hematuria (-), incontinence (-) Musculoskeletal: joint swelling (-), joint pain (-), back pain (-) Neuro: weakness (-), numbness (-), headache (-), confusion (-) Skin: Rash (-), lesions (-), dryness (-) Psych: depression (-), suicidal/homicidal ideation (-), feeling of hopelessness (-)   PHYSICAL EXAMINATION:  BP 124/67  Pulse 72  Temp(Src) 98.3 F (36.8 C) (Oral)  Resp 20  Ht 5\' 2"  (1.575 m)  Wt 132 lb 9.6 oz (60.147 kg)  BMI 24.25 kg/m2. General: Well developed, well nourished, in no acute distress.  EENT: No ocular or oral lesions. No stomatitis.  Respiratory: Lungs are clear to auscultation bilaterally with normal respiratory  movement and no accessory muscle use. Cardiac: No murmur, rub or tachycardia. No upper or lower extremity edema.  GI: Abdomen is soft, no palpable hepatosplenomegaly. No fluid wave. No tenderness. Musculoskeletal: No kyphosis, no tenderness over the spine, ribs or hips. Lymph: No cervical, infraclavicular, axillary or inguinal adenopathy. Neuro: No focal neurological deficits. Psych: Alert and oriented X 3, appropriate mood and affect.  Breast: left breast with radiation changes, no erythema,  lumpectomy site well healed with no nodularity.  Right breast: no nodularity or masses. ECOG PERFORMANCE STATUS: 0 - Asymptomatic    LABORATORY DATA: Lab Results  Component Value Date   WBC 4.6 04/15/2013   HGB 11.8 04/15/2013   HCT 36.0 04/15/2013   MCV 98.6 04/15/2013   PLT 203 04/15/2013      Chemistry      Component Value Date/Time   NA 139 12/28/2012 1059   NA 140 10/07/2012 1000   NA 138 04/12/2010 0954   K 4.5 12/28/2012 1059   K 4.4 10/07/2012 1000   K 4.8* 04/12/2010 0954   CL 105 12/28/2012 1059   CL 102 10/07/2012 1000   CL 101 04/12/2010 0954   CO2 26 12/28/2012 1059   CO2 26 10/07/2012 1000   CO2 26 04/12/2010 0954   BUN 19.1 12/28/2012 1059   BUN 22 10/07/2012 1000   BUN 14 04/12/2010 0954   CREATININE 1.4* 12/28/2012 1059   CREATININE 1.36* 10/07/2012 1000   CREATININE 1.2 04/12/2010 0954      Component Value Date/Time   CALCIUM 10.2 12/28/2012 1059   CALCIUM 10.3 10/07/2012 1000   CALCIUM 9.8 04/12/2010 0954   ALKPHOS 116 12/28/2012 1059   ALKPHOS 113 10/07/2012 1000   ALKPHOS 106* 04/12/2010 0954   AST 46* 12/28/2012 1059   AST 51* 10/07/2012 1000   AST 80* 04/12/2010 0954   ALT 36 12/28/2012 1059   ALT 39* 10/07/2012 1000   BILITOT 0.33 12/28/2012 1059   BILITOT 0.3 10/07/2012 1000   BILITOT 0.50 04/12/2010 0954    ADDITIONAL INFORMATION: 1. CHROMOGENIC IN-SITU HYBRIDIZATION Interpretation HER-2/NEU BY CISH - NO AMPLIFICATION OF HER-2 DETECTED. THE RATIO OF HER-2: CEP 17 SIGNALS WAS 1.09. Reference range: Ratio: HER2:CEP17 < 1.8 - gene amplification not observed Ratio: HER2:CEP 17 1.8-2.2 - equivocal result Ratio: HER2:CEP17 > 2.2 - gene amplification observed Pecola Leisure MD Pathologist, Electronic Signature ( Signed 10/22/2012) 1. A sample (block 1A) was sent to [facility] for Oncotype testing. The patient's recurrence score is 18. Those patients who had a recurrence score of 18 had an average rate of distant recurrence of 11%. (JBK:kh 10/23/11) Pecola Leisure MD Pathologist, Electronic Signature ( Signed 10/22/2012) FINAL DIAGNOSIS Diagnosis 1. Breast, lumpectomy, Left - INVASIVE DUCTAL CARCINOMA WITH PAPILLARY FEATURES, GRADE I/III, SPANNING 2.1 CM. - INVASIVE CARCINOMA IS FOCALLY 0.1 CM TO THE POSTERIOR MARGIN OF SPECIMEN #1. 1 of 3 FINAL for Baumgarten, Sharhonda ([id number]) Diagnosis(continued) - SEE ONCOLOGY TABLE BELOW. 2. Breast, excision, Left posterior margin - BENIGN BREAST PARENCHYMA. - BENIGN SKELETAL MUSCLE. - THERE IS NO EVIDENCE OF MALIGNANCY. - SEE COMMENT. 3. Lymph node, sentinel, biopsy, Left - THERE IS NO EVIDENCE OF CARCINOMA IN 1 OF 1 LYMPH NODE (0/1). 4. Lymph node, sentinel, biopsy, Left - THERE IS NO EVIDENCE OF CARCINOMA IN 1 OF 1 LYMPH NODE (0/1). 5. Lymph node, sentinel, biopsy, Left - THERE IS NO EVIDENCE OF CARCINOMA IN 1 OF 1 LYMPH NODE (0/1). Microscopic Comment 1. BREAST, INVASIVE TUMOR, WITH LYMPH NODE SAMPLING Specimen, including  laterality: Left breast Procedure: Partial mastectomy Grade: I Tubule formation: 2 Nuclear pleomorphism: 2 Mitotic: 1 Tumor size (gross measurement): 2.1 cm Margins: Invasive, distance to closest margin: Greater than 0.2 cm to all margins (including re-excision of posterior margin - specimen #2) Lymphovascular invasion: Not identified Ductal carcinoma in situ: Not identified Lobular neoplasia: Not identified Tumor focality: Unifocal Treatment effect: N/A Extent of tumor: Confined to breast parenchyma Lymph nodes: # examined: 3 Lymph nodes with metastasis: 0 Breast prognostic profile: [phone] Estrogen receptor: 100%, strong staining intensity Progesterone receptor: 13%, moderate staining intensity Her 2 neu: No amplification was detected. The ratio was 1.24. Her 2 neu by [facility] will be repeated on the current case and the results reported separately. Ki-67: 14% Non-neoplastic breast: Healing biopsy site. TNM: pT2, pN0 (JBK:caf 10/12/12) 2. The surgical  resection margins of the specimen were inked and microscopically evaluated.  ASSESSMENT: 79 year old female with  #1 history of anemia of chronic disease and iron deficiency patient has been receiving Aranesp injections.  #2 new diagnosis of breast cancer in the left breast that is ER positive HER-2/neu negative. Status post lumpectomy on 10/08/2012. Final pathology revealed a 2.1 cm invasive ductal carcinoma grade 1 ER PR positive HER-2/neu negative. Final clinical staging and pathologic staging stage II (T2 N0).  #3 patient was begun on radiation therapywhich he completed on 12/14/2012.  #4 in March 2014 patient was begun on Arimidex 1 mg daily adjuvantly. Risks and benefits of treatment were discussed with the patient. And literature was given to her. She is tolerating it well and has no evidence of recurrent disease.  PLAN:  #1 Doing well  #2 Continue taking arimidex 1 mg daily  #3 I have ordered a bone density scan  #4 Continue aranesp injections every 4 weeks  #5 I will see you baack in 6 months  All questions were answered. The patient knows to call the clinic with any problems, questions or concerns. The length of time of the face-to-face encounter was 30  minutes. More than 50% of time was spent counseling and coordination of care.

## 2013-05-05 ENCOUNTER — Ambulatory Visit
Admission: RE | Admit: 2013-05-05 | Discharge: 2013-05-05 | Disposition: A | Discharge status: Home / Self Care | Payer: PRIVATE HEALTH INSURANCE | Source: Ambulatory Visit | Attending: Oncology | Admitting: Oncology

## 2013-05-05 DIAGNOSIS — M8580 Other specified disorders of bone density and structure, unspecified site: Secondary | ICD-10-CM

## 2013-05-06 ENCOUNTER — Ambulatory Visit (HOSPITAL_BASED_OUTPATIENT_CLINIC_OR_DEPARTMENT_OTHER): Payer: PRIVATE HEALTH INSURANCE

## 2013-05-06 ENCOUNTER — Other Ambulatory Visit (HOSPITAL_BASED_OUTPATIENT_CLINIC_OR_DEPARTMENT_OTHER): Payer: PRIVATE HEALTH INSURANCE | Admitting: Lab

## 2013-05-06 VITALS — BP 126/48 | HR 75 | Temp 98.3°F

## 2013-05-06 DIAGNOSIS — C50912 Malignant neoplasm of unspecified site of left female breast: Secondary | ICD-10-CM

## 2013-05-06 DIAGNOSIS — D631 Anemia in chronic kidney disease: Secondary | ICD-10-CM

## 2013-05-06 DIAGNOSIS — E559 Vitamin D deficiency, unspecified: Secondary | ICD-10-CM

## 2013-05-06 DIAGNOSIS — N189 Chronic kidney disease, unspecified: Secondary | ICD-10-CM

## 2013-05-06 DIAGNOSIS — D638 Anemia in other chronic diseases classified elsewhere: Secondary | ICD-10-CM

## 2013-05-06 LAB — COMPREHENSIVE METABOLIC PANEL (CC13)
ALT: 39 U/L (ref 0–55)
CO2: 26 mEq/L (ref 22–29)
Calcium: 10.2 mg/dL (ref 8.4–10.4)
Chloride: 106 mEq/L (ref 98–109)
Creatinine: 1.4 mg/dL — ABNORMAL HIGH (ref 0.6–1.1)
Sodium: 142 mEq/L (ref 136–145)
Total Protein: 7.8 g/dL (ref 6.4–8.3)

## 2013-05-06 LAB — CBC WITH DIFFERENTIAL/PLATELET
Eosinophils Absolute: 0.1 10*3/uL (ref 0.0–0.5)
HGB: 10.2 g/dL — ABNORMAL LOW (ref 11.6–15.9)
LYMPH%: 15.7 % (ref 14.0–49.7)
MONO#: 0.6 10*3/uL (ref 0.1–0.9)
NEUT#: 4 10*3/uL (ref 1.5–6.5)
Platelets: 253 10*3/uL (ref 145–400)
RBC: 3.01 10*6/uL — ABNORMAL LOW (ref 3.70–5.45)
WBC: 5.6 10*3/uL (ref 3.9–10.3)

## 2013-05-06 MED ORDER — DARBEPOETIN ALFA-POLYSORBATE 300 MCG/0.6ML IJ SOLN
300.0000 ug | Freq: Once | INTRAMUSCULAR | Status: AC
  Administered 2013-05-06: 300 ug via SUBCUTANEOUS
  Filled 2013-05-06: qty 0.6

## 2013-05-07 LAB — VITAMIN D 25 HYDROXY (VIT D DEFICIENCY, FRACTURES): Vit D, 25-Hydroxy: 70 ng/mL (ref 30–89)

## 2013-05-24 ENCOUNTER — Other Ambulatory Visit: Payer: Self-pay | Admitting: Medical Oncology

## 2013-05-24 MED ORDER — INTEGRA PLUS PO CAPS: 1.0000 | ORAL_CAPSULE | Freq: Every day | ORAL | Status: DC

## 2013-05-24 NOTE — Telephone Encounter (Signed)
Pt called requesting refill of integra plus. Per NP, vo ok to refill. E-scribed to patient's requested pharmacy.

## 2013-06-02 ENCOUNTER — Other Ambulatory Visit: Payer: Self-pay | Admitting: *Deleted

## 2013-06-02 DIAGNOSIS — C50919 Malignant neoplasm of unspecified site of unspecified female breast: Secondary | ICD-10-CM

## 2013-06-03 ENCOUNTER — Other Ambulatory Visit (HOSPITAL_BASED_OUTPATIENT_CLINIC_OR_DEPARTMENT_OTHER): Payer: PRIVATE HEALTH INSURANCE | Admitting: Lab

## 2013-06-03 ENCOUNTER — Ambulatory Visit: Payer: PRIVATE HEALTH INSURANCE

## 2013-06-03 DIAGNOSIS — D638 Anemia in other chronic diseases classified elsewhere: Secondary | ICD-10-CM

## 2013-06-03 DIAGNOSIS — C50919 Malignant neoplasm of unspecified site of unspecified female breast: Secondary | ICD-10-CM

## 2013-06-03 LAB — CBC WITH DIFFERENTIAL/PLATELET
BASO%: 0.9 % (ref 0.0–2.0)
MCHC: 33.6 g/dL (ref 31.5–36.0)
MONO#: 0.5 10*3/uL (ref 0.1–0.9)
RBC: 3.43 10*6/uL — ABNORMAL LOW (ref 3.70–5.45)
RDW: 14 % (ref 11.2–14.5)
WBC: 4.6 10*3/uL (ref 3.9–10.3)
lymph#: 0.8 10*3/uL — ABNORMAL LOW (ref 0.9–3.3)

## 2013-06-03 MED ORDER — DARBEPOETIN ALFA-POLYSORBATE 500 MCG/ML IJ SOLN: 300.0000 ug | Freq: Once | INTRAMUSCULAR | Status: DC

## 2013-07-01 ENCOUNTER — Other Ambulatory Visit: Payer: Self-pay | Admitting: Emergency Medicine

## 2013-07-01 ENCOUNTER — Ambulatory Visit (HOSPITAL_BASED_OUTPATIENT_CLINIC_OR_DEPARTMENT_OTHER): Payer: PRIVATE HEALTH INSURANCE

## 2013-07-01 ENCOUNTER — Other Ambulatory Visit (HOSPITAL_BASED_OUTPATIENT_CLINIC_OR_DEPARTMENT_OTHER): Payer: PRIVATE HEALTH INSURANCE | Admitting: Lab

## 2013-07-01 VITALS — BP 109/41 | HR 69 | Temp 98.0°F

## 2013-07-01 DIAGNOSIS — D638 Anemia in other chronic diseases classified elsewhere: Secondary | ICD-10-CM

## 2013-07-01 DIAGNOSIS — D631 Anemia in chronic kidney disease: Secondary | ICD-10-CM

## 2013-07-01 DIAGNOSIS — N189 Chronic kidney disease, unspecified: Secondary | ICD-10-CM

## 2013-07-01 LAB — CBC WITH DIFFERENTIAL/PLATELET
BASO%: 0.7 % (ref 0.0–2.0)
Basophils Absolute: 0 10*3/uL (ref 0.0–0.1)
HCT: 30.6 % — ABNORMAL LOW (ref 34.8–46.6)
HGB: 10.6 g/dL — ABNORMAL LOW (ref 11.6–15.9)
MONO#: 0.6 10*3/uL (ref 0.1–0.9)
NEUT%: 67.9 % (ref 38.4–76.8)
WBC: 5.8 10*3/uL (ref 3.9–10.3)
lymph#: 1 10*3/uL (ref 0.9–3.3)

## 2013-07-01 MED ORDER — DARBEPOETIN ALFA-POLYSORBATE 300 MCG/0.6ML IJ SOLN
300.0000 ug | Freq: Once | INTRAMUSCULAR | Status: AC
  Administered 2013-07-01: 300 ug via SUBCUTANEOUS
  Filled 2013-07-01: qty 0.6

## 2013-07-28 ENCOUNTER — Other Ambulatory Visit: Payer: Self-pay | Admitting: Emergency Medicine

## 2013-07-28 DIAGNOSIS — D638 Anemia in other chronic diseases classified elsewhere: Secondary | ICD-10-CM

## 2013-07-29 ENCOUNTER — Ambulatory Visit: Payer: Medicaid Other

## 2013-07-29 ENCOUNTER — Other Ambulatory Visit (HOSPITAL_BASED_OUTPATIENT_CLINIC_OR_DEPARTMENT_OTHER): Payer: PRIVATE HEALTH INSURANCE | Admitting: Lab

## 2013-07-29 DIAGNOSIS — C50219 Malignant neoplasm of upper-inner quadrant of unspecified female breast: Secondary | ICD-10-CM

## 2013-07-29 DIAGNOSIS — D638 Anemia in other chronic diseases classified elsewhere: Secondary | ICD-10-CM

## 2013-07-29 LAB — CBC WITH DIFFERENTIAL/PLATELET
Basophils Absolute: 0.1 10*3/uL (ref 0.0–0.1)
EOS%: 2.5 % (ref 0.0–7.0)
Eosinophils Absolute: 0.1 10*3/uL (ref 0.0–0.5)
HCT: 33.9 % — ABNORMAL LOW (ref 34.8–46.6)
HGB: 11.2 g/dL — ABNORMAL LOW (ref 11.6–15.9)
MCH: 33.1 pg (ref 25.1–34.0)
NEUT%: 58 % (ref 38.4–76.8)
lymph#: 1.1 10*3/uL (ref 0.9–3.3)

## 2013-07-29 MED ORDER — DARBEPOETIN ALFA-POLYSORBATE 300 MCG/0.6ML IJ SOLN
300.0000 ug | Freq: Once | INTRAMUSCULAR | Status: DC
  Filled 2013-07-29: qty 0.6

## 2013-08-26 ENCOUNTER — Other Ambulatory Visit (HOSPITAL_BASED_OUTPATIENT_CLINIC_OR_DEPARTMENT_OTHER): Payer: PRIVATE HEALTH INSURANCE | Admitting: Lab

## 2013-08-26 ENCOUNTER — Ambulatory Visit (HOSPITAL_BASED_OUTPATIENT_CLINIC_OR_DEPARTMENT_OTHER): Payer: PRIVATE HEALTH INSURANCE

## 2013-08-26 ENCOUNTER — Other Ambulatory Visit: Payer: Self-pay | Admitting: Emergency Medicine

## 2013-08-26 ENCOUNTER — Other Ambulatory Visit: Payer: Self-pay | Admitting: Oncology

## 2013-08-26 VITALS — BP 132/58 | HR 73 | Temp 98.2°F | Resp 18

## 2013-08-26 DIAGNOSIS — D631 Anemia in chronic kidney disease: Secondary | ICD-10-CM

## 2013-08-26 DIAGNOSIS — D638 Anemia in other chronic diseases classified elsewhere: Secondary | ICD-10-CM

## 2013-08-26 DIAGNOSIS — C50919 Malignant neoplasm of unspecified site of unspecified female breast: Secondary | ICD-10-CM

## 2013-08-26 DIAGNOSIS — N289 Disorder of kidney and ureter, unspecified: Secondary | ICD-10-CM

## 2013-08-26 DIAGNOSIS — D508 Other iron deficiency anemias: Secondary | ICD-10-CM

## 2013-08-26 DIAGNOSIS — N189 Chronic kidney disease, unspecified: Secondary | ICD-10-CM

## 2013-08-26 LAB — CBC WITH DIFFERENTIAL/PLATELET
Basophils Absolute: 0 10*3/uL (ref 0.0–0.1)
EOS%: 3.4 % (ref 0.0–7.0)
HCT: 31.6 % — ABNORMAL LOW (ref 34.8–46.6)
HGB: 10.5 g/dL — ABNORMAL LOW (ref 11.6–15.9)
LYMPH%: 20.6 % (ref 14.0–49.7)
MCH: 32.8 pg (ref 25.1–34.0)
MCV: 98.7 fL (ref 79.5–101.0)
MONO%: 8.9 % (ref 0.0–14.0)
NEUT%: 66.3 % (ref 38.4–76.8)
Platelets: 218 10*3/uL (ref 145–400)
RDW: 13.6 % (ref 11.2–14.5)

## 2013-08-26 LAB — COMPREHENSIVE METABOLIC PANEL (CC13)
AST: 43 U/L — ABNORMAL HIGH (ref 5–34)
Alkaline Phosphatase: 91 U/L (ref 40–150)
BUN: 21.5 mg/dL (ref 7.0–26.0)
Creatinine: 1.4 mg/dL — ABNORMAL HIGH (ref 0.6–1.1)
Glucose: 95 mg/dl (ref 70–140)
Total Bilirubin: 0.28 mg/dL (ref 0.20–1.20)

## 2013-08-26 LAB — IRON AND TIBC CHCC
%SAT: 31 % (ref 21–57)
Iron: 119 ug/dL (ref 41–142)
TIBC: 381 ug/dL (ref 236–444)

## 2013-08-26 LAB — FERRITIN CHCC: Ferritin: 49 ng/ml (ref 9–269)

## 2013-08-26 MED ORDER — DARBEPOETIN ALFA-POLYSORBATE 300 MCG/0.6ML IJ SOLN
300.0000 ug | Freq: Once | INTRAMUSCULAR | Status: AC
  Administered 2013-08-26: 300 ug via SUBCUTANEOUS
  Filled 2013-08-26: qty 0.6

## 2013-09-01 ENCOUNTER — Ambulatory Visit: Payer: PRIVATE HEALTH INSURANCE

## 2013-09-03 ENCOUNTER — Encounter (INDEPENDENT_AMBULATORY_CARE_PROVIDER_SITE_OTHER): Payer: Self-pay

## 2013-09-03 ENCOUNTER — Ambulatory Visit (HOSPITAL_BASED_OUTPATIENT_CLINIC_OR_DEPARTMENT_OTHER): Payer: PRIVATE HEALTH INSURANCE

## 2013-09-03 VITALS — BP 143/72 | HR 81 | Temp 98.2°F | Resp 18

## 2013-09-03 DIAGNOSIS — D508 Other iron deficiency anemias: Secondary | ICD-10-CM

## 2013-09-03 DIAGNOSIS — D509 Iron deficiency anemia, unspecified: Secondary | ICD-10-CM

## 2013-09-03 MED ORDER — SODIUM CHLORIDE 0.9 % IV SOLN
Freq: Once | INTRAVENOUS | Status: AC
  Administered 2013-09-03: 11:00:00 via INTRAVENOUS

## 2013-09-03 MED ORDER — SODIUM CHLORIDE 0.9 % IV SOLN
1020.0000 mg | Freq: Once | INTRAVENOUS | Status: AC
  Administered 2013-09-03: 1020 mg via INTRAVENOUS
  Filled 2013-09-03: qty 34

## 2013-09-03 NOTE — Patient Instructions (Signed)

## 2013-09-22 ENCOUNTER — Other Ambulatory Visit: Payer: Self-pay | Admitting: Emergency Medicine

## 2013-09-22 DIAGNOSIS — C50919 Malignant neoplasm of unspecified site of unspecified female breast: Secondary | ICD-10-CM

## 2013-09-22 DIAGNOSIS — D638 Anemia in other chronic diseases classified elsewhere: Secondary | ICD-10-CM

## 2013-09-23 ENCOUNTER — Ambulatory Visit: Payer: PRIVATE HEALTH INSURANCE

## 2013-09-23 ENCOUNTER — Telehealth: Payer: Self-pay | Admitting: Oncology

## 2013-09-23 ENCOUNTER — Encounter: Payer: Self-pay | Admitting: Oncology

## 2013-09-23 ENCOUNTER — Ambulatory Visit (HOSPITAL_BASED_OUTPATIENT_CLINIC_OR_DEPARTMENT_OTHER): Payer: PRIVATE HEALTH INSURANCE | Admitting: Oncology

## 2013-09-23 ENCOUNTER — Other Ambulatory Visit (HOSPITAL_BASED_OUTPATIENT_CLINIC_OR_DEPARTMENT_OTHER): Payer: PRIVATE HEALTH INSURANCE | Admitting: Lab

## 2013-09-23 VITALS — BP 125/68 | HR 73 | Temp 98.7°F | Resp 18 | Ht 62.0 in | Wt 133.5 lb

## 2013-09-23 DIAGNOSIS — D638 Anemia in other chronic diseases classified elsewhere: Secondary | ICD-10-CM

## 2013-09-23 DIAGNOSIS — C50219 Malignant neoplasm of upper-inner quadrant of unspecified female breast: Secondary | ICD-10-CM

## 2013-09-23 DIAGNOSIS — D631 Anemia in chronic kidney disease: Secondary | ICD-10-CM

## 2013-09-23 DIAGNOSIS — M899 Disorder of bone, unspecified: Secondary | ICD-10-CM

## 2013-09-23 DIAGNOSIS — N189 Chronic kidney disease, unspecified: Secondary | ICD-10-CM

## 2013-09-23 DIAGNOSIS — C50912 Malignant neoplasm of unspecified site of left female breast: Secondary | ICD-10-CM

## 2013-09-23 DIAGNOSIS — D509 Iron deficiency anemia, unspecified: Secondary | ICD-10-CM

## 2013-09-23 DIAGNOSIS — C50919 Malignant neoplasm of unspecified site of unspecified female breast: Secondary | ICD-10-CM

## 2013-09-23 DIAGNOSIS — Z170 Estrogen receptor positive status [ER+]: Secondary | ICD-10-CM

## 2013-09-23 LAB — COMPREHENSIVE METABOLIC PANEL (CC13)
ALT: 58 U/L — ABNORMAL HIGH (ref 0–55)
AST: 63 U/L — ABNORMAL HIGH (ref 5–34)
Anion Gap: 10 mEq/L (ref 3–11)
CO2: 27 mEq/L (ref 22–29)
Chloride: 105 mEq/L (ref 98–109)
Creatinine: 1.3 mg/dL — ABNORMAL HIGH (ref 0.6–1.1)
Sodium: 142 mEq/L (ref 136–145)
Total Bilirubin: 0.41 mg/dL (ref 0.20–1.20)
Total Protein: 8.1 g/dL (ref 6.4–8.3)

## 2013-09-23 LAB — CBC WITH DIFFERENTIAL/PLATELET
BASO%: 1.1 % (ref 0.0–2.0)
EOS%: 2.1 % (ref 0.0–7.0)
LYMPH%: 21.7 % (ref 14.0–49.7)
MCH: 34 pg (ref 25.1–34.0)
MCHC: 32.5 g/dL (ref 31.5–36.0)
MONO#: 0.6 10*3/uL (ref 0.1–0.9)
NEUT%: 60.1 % (ref 38.4–76.8)
RBC: 3.93 10*6/uL (ref 3.70–5.45)
WBC: 4.3 10*3/uL (ref 3.9–10.3)
lymph#: 0.9 10*3/uL (ref 0.9–3.3)

## 2013-09-23 LAB — IRON AND TIBC CHCC
%SAT: 45 % (ref 21–57)
TIBC: 308 ug/dL (ref 236–444)
UIBC: 168 ug/dL (ref 120–384)

## 2013-09-23 MED ORDER — DARBEPOETIN ALFA-POLYSORBATE 500 MCG/ML IJ SOLN: 300.0000 ug | Freq: Once | INTRAMUSCULAR | Status: DC

## 2013-09-23 NOTE — Telephone Encounter (Signed)
, °

## 2013-09-23 NOTE — Progress Notes (Signed)
OFFICE PROGRESS NOTE  CC Dr. Renaye Rakers  DIAGNOSIS: Anemia of chronic disease, renal insufficiency and iron deficiency, Breast cancer.  PRIOR THERAPY: #1 Aranesp injections every 4 weeks and iron as indicated.   #2 mammogram and ultrasound guided core biopsy of the left breast. Prognostic markers showed invasive ductal carcinoma grade 1 or 2 ER positive PR positive HER-2/neu negative Ki-67 around 14%.  #3 Left partial mastectomy and sentinel node biopsy on 10/08/12, 2.1cm invasive ductal carcinoma, all three sentinel nodes were negatve.  Posterior margin had to be re-excised.    #4 Radiation therapy from 1/14 to 12/14/12  #5 Arimidex daily beginning 12/28/12  CURRENT THERAPY: Aranesp injections every 4 weeks and iron as indicated. Arimidex daily  INTERVAL HISTORY:  [name] is doing well today.  Overall patient is doing well she's tolerating the Arimidex very nicely without any problems. She had IV iron 2 weeks ago when she feels much better. She continues taking Aranesp injections every 4 weeks. Today her hemoglobin looks terrific and will not get an injection. Remainder of the 10 point review of systems is negative. ALLERGIES:  None Current Outpatient Prescriptions  Medication Sig Dispense Refill  . allopurinol (ZYLOPRIM) 100 MG tablet Take 100 mg by mouth daily.       . Amlodipine-Valsartan-HCTZ (EXFORGE HCT) 5-160-12.5 MG TABS Take 1 tablet by mouth daily.      [name] anastrozole (ARIMIDEX) 1 MG tablet Take 1 tablet (1 mg total) by mouth daily.  30 tablet  12  . calcium carbonate (OS-CAL) 600 MG TABS Take 600 mg by mouth daily.      . Cholecalciferol (VITAMIN D-3) 1000 UNITS CAPS Take 1,000 Units by mouth daily.      . diclofenac sodium (VOLTAREN) 1 % GEL Apply 2 g topically 3 (three) times daily as needed. For arthritis pain      . FeFum-FePoly-FA-B Cmp-C-Biot (INTEGRA PLUS) CAPS Take 1 capsule by mouth daily.  30 capsule  3  . folic acid (FOLVITE) 400 MCG tablet Take 400-800  mcg by mouth daily.      . non-metallic deodorant Thornton Papas) MISC Apply 1 application topically daily as needed.      . Omega-3 Fatty Acids (FISH OIL) 500 MG CAPS Take 1 capsule by mouth daily.      . vitamin C (ASCORBIC ACID) 500 MG tablet Take 500 mg by mouth daily.      . Wound Cleansers (RADIAPLEX EX) Apply topically.       No current facility-administered medications for this visit.    Health Maintenance  Mammogram: 06/2012 Colonoscopy: 2009 Bone Density Scan: 2 years ago Pap Smear: s/p TAH/BSO Eye Exam: 12/2011 Vitamin D Level: pending Lipid Panel: 06/2012  ROS:  General: fatigue (-), night sweats (-), fever (-), pain (-) Lymph: palpable nodes (-) HEENT: vision changes (-), mucositis (-), gum bleeding (-), epistaxis (-) Cardiovascular: chest pain (-), palpitations (-) Pulmonary: shortness of breath (-), dyspnea on exertion (-), cough (-), hemoptysis (-) GI:  Early satiety (-), melena (-), dysphagia (-), nausea/vomiting (-), diarrhea (-) GU: dysuria (-), hematuria (-), incontinence (-) Musculoskeletal: joint swelling (-), joint pain (-), back pain (-) Neuro: weakness (-), numbness (-), headache (-), confusion (-) Skin: Rash (-), lesions (-), dryness (-) Psych: depression (-), suicidal/homicidal ideation (-), feeling of hopelessness (-)   PHYSICAL EXAMINATION:  BP 125/68  Pulse 73  Temp(Src) 98.7 F (37.1 C) (Oral)  Resp 18  Ht 5\' 2"  (1.575 m)  Wt 133 lb 8 oz (60.555 kg)  BMI 24.41 kg/m2. General: Well developed, well nourished, in no acute distress.  EENT: No ocular or oral lesions. No stomatitis.  Respiratory: Lungs are clear to auscultation bilaterally with normal respiratory movement and no accessory muscle use. Cardiac: No murmur, rub or tachycardia. No upper or lower extremity edema.  GI: Abdomen is soft, no palpable hepatosplenomegaly. No fluid wave. No tenderness. Musculoskeletal: No kyphosis, no tenderness over the spine, ribs or hips. Lymph: No cervical,  infraclavicular, axillary or inguinal adenopathy. Neuro: No focal neurological deficits. Psych: Alert and oriented X 3, appropriate mood and affect.  Breast: left breast with radiation changes, no erythema, lumpectomy site well healed with no nodularity.  Right breast: no nodularity or masses. ECOG PERFORMANCE STATUS: 0 - Asymptomatic    LABORATORY DATA: Lab Results  Component Value Date   WBC 4.3 09/23/2013   HGB 13.4 09/23/2013   HCT 41.1 09/23/2013   MCV 104.6* 09/23/2013   PLT 197 09/23/2013      Chemistry      Component Value Date/Time   NA 142 09/23/2013 1009   NA 140 10/07/2012 1000   NA 138 04/12/2010 0954   K 4.6 09/23/2013 1009   K 4.4 10/07/2012 1000   K 4.8* 04/12/2010 0954   CL 105 12/28/2012 1059   CL 102 10/07/2012 1000   CL 101 04/12/2010 0954   CO2 27 09/23/2013 1009   CO2 26 10/07/2012 1000   CO2 26 04/12/2010 0954   BUN 21.1 09/23/2013 1009   BUN 22 10/07/2012 1000   BUN 14 04/12/2010 0954   CREATININE 1.3* 09/23/2013 1009   CREATININE 1.36* 10/07/2012 1000   CREATININE 1.2 04/12/2010 0954      Component Value Date/Time   CALCIUM 10.2 09/23/2013 1009   CALCIUM 10.3 10/07/2012 1000   CALCIUM 9.8 04/12/2010 0954   ALKPHOS 102 09/23/2013 1009   ALKPHOS 113 10/07/2012 1000   ALKPHOS 106* 04/12/2010 0954   AST 63* 09/23/2013 1009   AST 51* 10/07/2012 1000   AST 80* 04/12/2010 0954   ALT 58* 09/23/2013 1009   ALT 39* 10/07/2012 1000   ALT 69* 04/12/2010 0954   BILITOT 0.41 09/23/2013 1009   BILITOT 0.3 10/07/2012 1000   BILITOT 0.50 04/12/2010 0954    ADDITIONAL INFORMATION: 1. CHROMOGENIC IN-SITU HYBRIDIZATION Interpretation HER-2/NEU BY CISH - NO AMPLIFICATION OF HER-2 DETECTED. THE RATIO OF HER-2: CEP 17 SIGNALS WAS 1.09. Reference range: Ratio: HER2:CEP17 < 1.8 - gene amplification not observed Ratio: HER2:CEP 17 1.8-2.2 - equivocal result Ratio: HER2:CEP17 > 2.2 - gene amplification observed Pecola Leisure MD Pathologist, Electronic Signature ( Signed  10/22/2012) 1. A sample (block 1A) was sent to [facility] for Oncotype testing. The patient's recurrence score is 18. Those patients who had a recurrence score of 18 had an average rate of distant recurrence of 11%. (JBK:kh 10/23/11) Pecola Leisure MD Pathologist, Electronic Signature ( Signed 10/22/2012) FINAL DIAGNOSIS Diagnosis 1. Breast, lumpectomy, Left - INVASIVE DUCTAL CARCINOMA WITH PAPILLARY FEATURES, GRADE I/III, SPANNING 2.1 CM. - INVASIVE CARCINOMA IS FOCALLY 0.1 CM TO THE POSTERIOR MARGIN OF SPECIMEN #1. 1 of 3 FINAL for Klugh, Leahmarie ([id number]) Diagnosis(continued) - SEE ONCOLOGY TABLE BELOW. 2. Breast, excision, Left posterior margin - BENIGN BREAST PARENCHYMA. - BENIGN SKELETAL MUSCLE. - THERE IS NO EVIDENCE OF MALIGNANCY. - SEE COMMENT. 3. Lymph node, sentinel, biopsy, Left - THERE IS NO EVIDENCE OF CARCINOMA IN 1 OF 1 LYMPH NODE (0/1). 4. Lymph node, sentinel, biopsy, Left - THERE IS NO EVIDENCE OF CARCINOMA IN  1 OF 1 LYMPH NODE (0/1). 5. Lymph node, sentinel, biopsy, Left - THERE IS NO EVIDENCE OF CARCINOMA IN 1 OF 1 LYMPH NODE (0/1). Microscopic Comment 1. BREAST, INVASIVE TUMOR, WITH LYMPH NODE SAMPLING Specimen, including laterality: Left breast Procedure: Partial mastectomy Grade: I Tubule formation: 2 Nuclear pleomorphism: 2 Mitotic: 1 Tumor size (gross measurement): 2.1 cm Margins: Invasive, distance to closest margin: Greater than 0.2 cm to all margins (including re-excision of posterior margin - specimen #2) Lymphovascular invasion: Not identified Ductal carcinoma in situ: Not identified Lobular neoplasia: Not identified Tumor focality: Unifocal Treatment effect: N/A Extent of tumor: Confined to breast parenchyma Lymph nodes: # examined: 3 Lymph nodes with metastasis: 0 Breast prognostic profile: [phone] Estrogen receptor: 100%, strong staining intensity Progesterone receptor: 13%, moderate staining intensity Her 2 neu: No  amplification was detected. The ratio was 1.24. Her 2 neu by [facility] will be repeated on the current case and the results reported separately. Ki-67: 14% Non-neoplastic breast: Healing biopsy site. TNM: pT2, pN0 (JBK:caf 10/12/12) 2. The surgical resection margins of the specimen were inked and microscopically evaluated.  ASSESSMENT: 79 year old female with  #1 history of anemia of chronic disease and iron deficiency patient has been receiving Aranesp injections.  #2 new diagnosis of breast cancer in the left breast that is ER positive HER-2/neu negative. Status post lumpectomy on 10/08/2012. Final pathology revealed a 2.1 cm invasive ductal carcinoma grade 1 ER PR positive HER-2/neu negative. Final clinical staging and pathologic staging stage II (T2 N0).  #3 patient was begun on radiation therapywhich he completed on 12/14/2012.  #4 in March 2014 patient was begun on Arimidex 1 mg daily adjuvantly. Risks and benefits of treatment were discussed with the patient. And literature was given to her. She is tolerating it well and has no evidence of recurrent disease.  #5 osteopenia: calcium/vitamin D3 and weight bearing exercises  PLAN:  #1 Doing well  #2 Continue taking arimidex 1 mg daily  #3 take vitamin D3 and calcium  #4 Continue aranesp injections every 4 weeks  #5 I will see you back in 6 months  All questions were answered. The patient knows to call the clinic with any problems, questions or concerns. The length of time of the face-to-face encounter was 30  minutes. More than 50% of time was spent counseling and coordination of care.   Drue Second, MD Medical/Oncology [facility] [phone] (beeper) [phone] (Office)  09/23/2013, 11:22 AM

## 2013-10-22 ENCOUNTER — Ambulatory Visit: Payer: PRIVATE HEALTH INSURANCE

## 2013-10-22 ENCOUNTER — Encounter (INDEPENDENT_AMBULATORY_CARE_PROVIDER_SITE_OTHER): Payer: Self-pay

## 2013-10-22 ENCOUNTER — Other Ambulatory Visit (HOSPITAL_BASED_OUTPATIENT_CLINIC_OR_DEPARTMENT_OTHER): Payer: PRIVATE HEALTH INSURANCE

## 2013-10-22 DIAGNOSIS — C50919 Malignant neoplasm of unspecified site of unspecified female breast: Secondary | ICD-10-CM

## 2013-10-22 DIAGNOSIS — C50912 Malignant neoplasm of unspecified site of left female breast: Secondary | ICD-10-CM

## 2013-10-22 DIAGNOSIS — D638 Anemia in other chronic diseases classified elsewhere: Secondary | ICD-10-CM

## 2013-10-22 DIAGNOSIS — C50219 Malignant neoplasm of upper-inner quadrant of unspecified female breast: Secondary | ICD-10-CM

## 2013-10-22 LAB — CBC WITH DIFFERENTIAL/PLATELET
BASO%: 0.9 % (ref 0.0–2.0)
Basophils Absolute: 0 10*3/uL (ref 0.0–0.1)
EOS ABS: 0.1 10*3/uL (ref 0.0–0.5)
EOS%: 1.9 % (ref 0.0–7.0)
HCT: 35.7 % (ref 34.8–46.6)
HGB: 12 g/dL (ref 11.6–15.9)
LYMPH%: 18.9 % (ref 14.0–49.7)
MCH: 34.1 pg — AB (ref 25.1–34.0)
MCHC: 33.7 g/dL (ref 31.5–36.0)
MCV: 101.1 fL — AB (ref 79.5–101.0)
MONO#: 0.5 10*3/uL (ref 0.1–0.9)
MONO%: 9.7 % (ref 0.0–14.0)
NEUT#: 3.6 10*3/uL (ref 1.5–6.5)
NEUT%: 68.6 % (ref 38.4–76.8)
PLATELETS: 188 10*3/uL (ref 145–400)
RBC: 3.53 10*6/uL — AB (ref 3.70–5.45)
RDW: 14 % (ref 11.2–14.5)
WBC: 5.3 10*3/uL (ref 3.9–10.3)
lymph#: 1 10*3/uL (ref 0.9–3.3)

## 2013-10-22 LAB — COMPREHENSIVE METABOLIC PANEL (CC13)
ALK PHOS: 115 U/L (ref 40–150)
ALT: 70 U/L — ABNORMAL HIGH (ref 0–55)
AST: 69 U/L — ABNORMAL HIGH (ref 5–34)
Albumin: 3.5 g/dL (ref 3.5–5.0)
Anion Gap: 12 mEq/L — ABNORMAL HIGH (ref 3–11)
BILIRUBIN TOTAL: 0.3 mg/dL (ref 0.20–1.20)
BUN: 33 mg/dL — ABNORMAL HIGH (ref 7.0–26.0)
CO2: 24 mEq/L (ref 22–29)
Calcium: 9.8 mg/dL (ref 8.4–10.4)
Chloride: 106 mEq/L (ref 98–109)
Creatinine: 1.4 mg/dL — ABNORMAL HIGH (ref 0.6–1.1)
Glucose: 93 mg/dl (ref 70–140)
Potassium: 4.7 mEq/L (ref 3.5–5.1)
SODIUM: 142 meq/L (ref 136–145)
TOTAL PROTEIN: 7.9 g/dL (ref 6.4–8.3)

## 2013-10-22 LAB — IRON AND TIBC CHCC
%SAT: 54 % (ref 21–57)
IRON: 151 ug/dL — AB (ref 41–142)
TIBC: 280 ug/dL (ref 236–444)
UIBC: 129 ug/dL (ref 120–384)

## 2013-10-22 LAB — FERRITIN CHCC: FERRITIN: 959 ng/mL — AB (ref 9–269)

## 2013-10-22 MED ORDER — DARBEPOETIN ALFA-POLYSORBATE 300 MCG/0.6ML IJ SOLN
300.0000 ug | Freq: Once | INTRAMUSCULAR | Status: DC
  Filled 2013-10-22: qty 0.6

## 2013-11-04 ENCOUNTER — Ambulatory Visit (INDEPENDENT_AMBULATORY_CARE_PROVIDER_SITE_OTHER): Payer: PRIVATE HEALTH INSURANCE | Admitting: General Surgery

## 2013-11-05 ENCOUNTER — Observation Stay (HOSPITAL_COMMUNITY)
Admission: EM | Admit: 2013-11-05 | Discharge: 2013-11-09 | Disposition: A | Discharge status: Home / Self Care | Payer: PRIVATE HEALTH INSURANCE | Attending: Internal Medicine | Admitting: Internal Medicine

## 2013-11-05 ENCOUNTER — Encounter (HOSPITAL_COMMUNITY): Payer: Self-pay | Admitting: Emergency Medicine

## 2013-11-05 DIAGNOSIS — F419 Anxiety disorder, unspecified: Secondary | ICD-10-CM

## 2013-11-05 DIAGNOSIS — I10 Essential (primary) hypertension: Secondary | ICD-10-CM | POA: Diagnosis present

## 2013-11-05 DIAGNOSIS — C50912 Malignant neoplasm of unspecified site of left female breast: Secondary | ICD-10-CM | POA: Diagnosis present

## 2013-11-05 DIAGNOSIS — R195 Other fecal abnormalities: Secondary | ICD-10-CM

## 2013-11-05 DIAGNOSIS — Z853 Personal history of malignant neoplasm of breast: Secondary | ICD-10-CM | POA: Insufficient documentation

## 2013-11-05 DIAGNOSIS — E785 Hyperlipidemia, unspecified: Secondary | ICD-10-CM | POA: Insufficient documentation

## 2013-11-05 DIAGNOSIS — R55 Syncope and collapse: Principal | ICD-10-CM | POA: Insufficient documentation

## 2013-11-05 DIAGNOSIS — R531 Weakness: Secondary | ICD-10-CM

## 2013-11-05 DIAGNOSIS — D638 Anemia in other chronic diseases classified elsewhere: Secondary | ICD-10-CM | POA: Insufficient documentation

## 2013-11-05 DIAGNOSIS — N289 Disorder of kidney and ureter, unspecified: Secondary | ICD-10-CM | POA: Diagnosis present

## 2013-11-05 DIAGNOSIS — Z923 Personal history of irradiation: Secondary | ICD-10-CM | POA: Insufficient documentation

## 2013-11-05 DIAGNOSIS — F411 Generalized anxiety disorder: Secondary | ICD-10-CM | POA: Insufficient documentation

## 2013-11-05 DIAGNOSIS — D500 Iron deficiency anemia secondary to blood loss (chronic): Secondary | ICD-10-CM | POA: Insufficient documentation

## 2013-11-05 DIAGNOSIS — K2940 Chronic atrophic gastritis without bleeding: Secondary | ICD-10-CM | POA: Insufficient documentation

## 2013-11-05 DIAGNOSIS — K759 Inflammatory liver disease, unspecified: Secondary | ICD-10-CM | POA: Insufficient documentation

## 2013-11-05 DIAGNOSIS — K31811 Angiodysplasia of stomach and duodenum with bleeding: Secondary | ICD-10-CM | POA: Insufficient documentation

## 2013-11-05 DIAGNOSIS — C50919 Malignant neoplasm of unspecified site of unspecified female breast: Secondary | ICD-10-CM | POA: Diagnosis present

## 2013-11-05 DIAGNOSIS — D649 Anemia, unspecified: Secondary | ICD-10-CM | POA: Diagnosis present

## 2013-11-05 DIAGNOSIS — E789 Disorder of lipoprotein metabolism, unspecified: Secondary | ICD-10-CM

## 2013-11-05 DIAGNOSIS — Z87891 Personal history of nicotine dependence: Secondary | ICD-10-CM | POA: Insufficient documentation

## 2013-11-05 LAB — CBC WITH DIFFERENTIAL/PLATELET
BASOS PCT: 0 % (ref 0–1)
Basophils Absolute: 0 10*3/uL (ref 0.0–0.1)
EOS ABS: 0 10*3/uL (ref 0.0–0.7)
Eosinophils Relative: 0 % (ref 0–5)
HCT: 27.5 % — ABNORMAL LOW (ref 36.0–46.0)
HEMOGLOBIN: 9.4 g/dL — AB (ref 12.0–15.0)
Lymphocytes Relative: 15 % (ref 12–46)
Lymphs Abs: 1.3 10*3/uL (ref 0.7–4.0)
MCH: 34.1 pg — AB (ref 26.0–34.0)
MCHC: 34.2 g/dL (ref 30.0–36.0)
MCV: 99.6 fL (ref 78.0–100.0)
MONOS PCT: 6 % (ref 3–12)
Monocytes Absolute: 0.5 10*3/uL (ref 0.1–1.0)
Neutro Abs: 7 10*3/uL (ref 1.7–7.7)
Neutrophils Relative %: 79 % — ABNORMAL HIGH (ref 43–77)
PLATELETS: 194 10*3/uL (ref 150–400)
RBC: 2.76 MIL/uL — ABNORMAL LOW (ref 3.87–5.11)
RDW: 13.7 % (ref 11.5–15.5)
WBC: 8.8 10*3/uL (ref 4.0–10.5)

## 2013-11-05 LAB — COMPREHENSIVE METABOLIC PANEL
ALT: 44 U/L — ABNORMAL HIGH (ref 0–35)
AST: 44 U/L — AB (ref 0–37)
Albumin: 3.6 g/dL (ref 3.5–5.2)
Alkaline Phosphatase: 88 U/L (ref 39–117)
BUN: 41 mg/dL — AB (ref 6–23)
CO2: 27 mEq/L (ref 19–32)
CREATININE: 1.47 mg/dL — AB (ref 0.50–1.10)
Calcium: 10.6 mg/dL — ABNORMAL HIGH (ref 8.4–10.5)
Chloride: 103 mEq/L (ref 96–112)
GFR calc Af Amer: 38 mL/min — ABNORMAL LOW (ref >90)
GFR calc non Af Amer: 33 mL/min — ABNORMAL LOW (ref >90)
Glucose, Bld: 121 mg/dL — ABNORMAL HIGH (ref 70–99)
POTASSIUM: 4.9 meq/L (ref 3.7–5.3)
Sodium: 142 mEq/L (ref 137–147)
Total Bilirubin: <0.2 mg/dL — ABNORMAL LOW (ref 0.3–1.2)
Total Protein: 7.7 g/dL (ref 6.0–8.3)

## 2013-11-05 LAB — URINALYSIS, ROUTINE W REFLEX MICROSCOPIC
BILIRUBIN URINE: NEGATIVE
Glucose, UA: NEGATIVE mg/dL
Hgb urine dipstick: NEGATIVE
Ketones, ur: NEGATIVE mg/dL
NITRITE: NEGATIVE
PROTEIN: NEGATIVE mg/dL
Specific Gravity, Urine: 1.017 (ref 1.005–1.030)
UROBILINOGEN UA: 0.2 mg/dL (ref 0.0–1.0)
pH: 7.5 (ref 5.0–8.0)

## 2013-11-05 LAB — URINE MICROSCOPIC-ADD ON

## 2013-11-05 LAB — OCCULT BLOOD, POC DEVICE: Fecal Occult Bld: POSITIVE — AB

## 2013-11-05 LAB — SAMPLE TO BLOOD BANK

## 2013-11-05 MED ORDER — CALCIUM CARBONATE 1250 (500 CA) MG PO TABS
1.0000 | ORAL_TABLET | Freq: Every day | ORAL | Status: DC
  Administered 2013-11-06 – 2013-11-09 (×4): 500 mg via ORAL
  Filled 2013-11-05 (×4): qty 1

## 2013-11-05 MED ORDER — ACETAMINOPHEN 650 MG RE SUPP: 650.0000 mg | Freq: Four times a day (QID) | RECTAL | Status: DC | PRN

## 2013-11-05 MED ORDER — ONDANSETRON HCL 4 MG PO TABS: 4.0000 mg | ORAL_TABLET | Freq: Four times a day (QID) | ORAL | Status: DC | PRN

## 2013-11-05 MED ORDER — ALUM & MAG HYDROXIDE-SIMETH 200-200-20 MG/5ML PO SUSP: 30.0000 mL | Freq: Four times a day (QID) | ORAL | Status: DC | PRN

## 2013-11-05 MED ORDER — OMEGA-3-ACID ETHYL ESTERS 1 G PO CAPS
1.0000 g | ORAL_CAPSULE | Freq: Every day | ORAL | Status: DC
  Administered 2013-11-06 – 2013-11-09 (×4): 1 g via ORAL
  Filled 2013-11-05 (×4): qty 1

## 2013-11-05 MED ORDER — HYDROMORPHONE HCL PF 1 MG/ML IJ SOLN: 0.5000 mg | INTRAMUSCULAR | Status: DC | PRN

## 2013-11-05 MED ORDER — VITAMIN D3 25 MCG (1000 UNIT) PO TABS
1000.0000 [IU] | ORAL_TABLET | Freq: Every day | ORAL | Status: DC
  Administered 2013-11-06 – 2013-11-09 (×4): 1000 [IU] via ORAL
  Filled 2013-11-05 (×4): qty 1

## 2013-11-05 MED ORDER — ACETAMINOPHEN 325 MG PO TABS: 650.0000 mg | ORAL_TABLET | Freq: Four times a day (QID) | ORAL | Status: DC | PRN

## 2013-11-05 MED ORDER — SODIUM CHLORIDE 0.9 % IJ SOLN
3.0000 mL | Freq: Two times a day (BID) | INTRAMUSCULAR | Status: DC
  Administered 2013-11-06 – 2013-11-09 (×6): 3 mL via INTRAVENOUS

## 2013-11-05 MED ORDER — SODIUM CHLORIDE 0.9 % IV SOLN
INTRAVENOUS | Status: DC
  Administered 2013-11-06: via INTRAVENOUS

## 2013-11-05 MED ORDER — PANTOPRAZOLE SODIUM 40 MG IV SOLR
40.0000 mg | Freq: Two times a day (BID) | INTRAVENOUS | Status: DC
  Administered 2013-11-06 – 2013-11-09 (×6): 40 mg via INTRAVENOUS
  Filled 2013-11-05 (×9): qty 40

## 2013-11-05 MED ORDER — SODIUM CHLORIDE 0.9 % IV SOLN: INTRAVENOUS | Status: DC

## 2013-11-05 MED ORDER — SODIUM CHLORIDE 0.9 % IV BOLUS (SEPSIS)
1000.0000 mL | Freq: Once | INTRAVENOUS | Status: AC
  Administered 2013-11-05: 1000 mL via INTRAVENOUS

## 2013-11-05 MED ORDER — ZOLPIDEM TARTRATE 5 MG PO TABS: 5.0000 mg | ORAL_TABLET | Freq: Every evening | ORAL | Status: DC | PRN

## 2013-11-05 MED ORDER — OXYCODONE HCL 5 MG PO TABS: 5.0000 mg | ORAL_TABLET | ORAL | Status: DC | PRN

## 2013-11-05 MED ORDER — ALLOPURINOL 100 MG PO TABS
100.0000 mg | ORAL_TABLET | Freq: Every day | ORAL | Status: DC
  Administered 2013-11-06 – 2013-11-09 (×4): 100 mg via ORAL
  Filled 2013-11-05 (×4): qty 1

## 2013-11-05 MED ORDER — ONDANSETRON HCL 4 MG/2ML IJ SOLN: 4.0000 mg | Freq: Four times a day (QID) | INTRAMUSCULAR | Status: DC | PRN

## 2013-11-05 MED ORDER — ANASTROZOLE 1 MG PO TABS
1.0000 mg | ORAL_TABLET | Freq: Every day | ORAL | Status: DC
  Administered 2013-11-06 – 2013-11-09 (×4): 1 mg via ORAL
  Filled 2013-11-05 (×4): qty 1

## 2013-11-05 NOTE — ED Notes (Signed)
Pt reports having "black out" event today while having a bowel movement. Pt then got up to her bed and thinks she may have been unconscious for a few minutes but is not sure, pt denies falling and denies ever being on the ground during or after event. Pt reports that she has a history of the same happening, always while having a BM. Pt reports BM is soft and dark due to iron pill. Pt reports generalized weakness for the past week. Pt reports having flu-like symptoms for the past week. Denies symptoms at this time. Pt ambulatory to wheelchair in triage. NAD noted. No neuro deficits.

## 2013-11-05 NOTE — ED Notes (Signed)
The pt is c/o weakness and not feeling well for 2-3 days.  Hx of anemia.  Diarrhea today abd she felt faint.  Dark stools but she takes iron and her stools are usually dark

## 2013-11-06 ENCOUNTER — Inpatient Hospital Stay (HOSPITAL_COMMUNITY): Payer: PRIVATE HEALTH INSURANCE

## 2013-11-06 ENCOUNTER — Encounter (HOSPITAL_COMMUNITY): Payer: Self-pay | Admitting: Radiology

## 2013-11-06 DIAGNOSIS — K759 Inflammatory liver disease, unspecified: Secondary | ICD-10-CM

## 2013-11-06 DIAGNOSIS — D649 Anemia, unspecified: Secondary | ICD-10-CM

## 2013-11-06 DIAGNOSIS — R195 Other fecal abnormalities: Secondary | ICD-10-CM

## 2013-11-06 DIAGNOSIS — C50919 Malignant neoplasm of unspecified site of unspecified female breast: Secondary | ICD-10-CM

## 2013-11-06 DIAGNOSIS — E789 Disorder of lipoprotein metabolism, unspecified: Secondary | ICD-10-CM

## 2013-11-06 DIAGNOSIS — R5381 Other malaise: Secondary | ICD-10-CM

## 2013-11-06 DIAGNOSIS — N289 Disorder of kidney and ureter, unspecified: Secondary | ICD-10-CM

## 2013-11-06 DIAGNOSIS — R5383 Other fatigue: Secondary | ICD-10-CM

## 2013-11-06 DIAGNOSIS — I10 Essential (primary) hypertension: Secondary | ICD-10-CM

## 2013-11-06 LAB — CBC
HCT: 24 % — ABNORMAL LOW (ref 36.0–46.0)
HEMATOCRIT: 24.5 % — AB (ref 36.0–46.0)
HEMOGLOBIN: 8.4 g/dL — AB (ref 12.0–15.0)
Hemoglobin: 8.2 g/dL — ABNORMAL LOW (ref 12.0–15.0)
MCH: 34 pg (ref 26.0–34.0)
MCH: 34.3 pg — ABNORMAL HIGH (ref 26.0–34.0)
MCHC: 34.2 g/dL (ref 30.0–36.0)
MCHC: 34.3 g/dL (ref 30.0–36.0)
MCV: 99.6 fL (ref 78.0–100.0)
MCV: 100 fL (ref 78.0–100.0)
PLATELETS: 163 10*3/uL (ref 150–400)
Platelets: 161 10*3/uL (ref 150–400)
RBC: 2.41 MIL/uL — AB (ref 3.87–5.11)
RBC: 2.45 MIL/uL — ABNORMAL LOW (ref 3.87–5.11)
RDW: 14 % (ref 11.5–15.5)
RDW: 13.8 % (ref 11.5–15.5)
WBC: 6.1 10*3/uL (ref 4.0–10.5)
WBC: 5.7 10*3/uL (ref 4.0–10.5)

## 2013-11-06 LAB — BASIC METABOLIC PANEL
BUN: 28 mg/dL — ABNORMAL HIGH (ref 6–23)
CO2: 21 mEq/L (ref 19–32)
Calcium: 9.1 mg/dL (ref 8.4–10.5)
Chloride: 108 mEq/L (ref 96–112)
Creatinine, Ser: 1.12 mg/dL — ABNORMAL HIGH (ref 0.50–1.10)
GFR, EST AFRICAN AMERICAN: 53 mL/min — AB (ref >90)
GFR, EST NON AFRICAN AMERICAN: 45 mL/min — AB (ref >90)
Glucose, Bld: 143 mg/dL — ABNORMAL HIGH (ref 70–99)
Potassium: 4 mEq/L (ref 3.7–5.3)
SODIUM: 142 meq/L (ref 137–147)

## 2013-11-06 LAB — RETICULOCYTES
RBC.: 2.42 MIL/uL — AB (ref 3.87–5.11)
RETIC CT PCT: 3.8 % — AB (ref 0.4–3.1)
Retic Count, Absolute: 92 10*3/uL (ref 19.0–186.0)

## 2013-11-06 LAB — FERRITIN: Ferritin: 841 ng/mL — ABNORMAL HIGH (ref 10–291)

## 2013-11-06 LAB — HEMOGLOBIN AND HEMATOCRIT, BLOOD
HCT: 24.7 % — ABNORMAL LOW (ref 36.0–46.0)
HEMATOCRIT: 24 % — AB (ref 36.0–46.0)
HEMOGLOBIN: 8.7 g/dL — AB (ref 12.0–15.0)
Hemoglobin: 8.3 g/dL — ABNORMAL LOW (ref 12.0–15.0)

## 2013-11-06 LAB — IRON AND TIBC
Iron: 73 ug/dL (ref 42–135)
SATURATION RATIOS: 24 % (ref 20–55)
TIBC: 302 ug/dL (ref 250–470)
UIBC: 229 ug/dL (ref 125–400)

## 2013-11-06 LAB — TROPONIN I

## 2013-11-06 LAB — VITAMIN B12: VITAMIN B 12: 821 pg/mL (ref 211–911)

## 2013-11-06 LAB — FOLATE: Folate: >20 ng/mL

## 2013-11-06 LAB — CORTISOL: Cortisol, Plasma: 10.3 ug/dL

## 2013-11-06 MED ORDER — IOHEXOL 300 MG/ML  SOLN
100.0000 mL | Freq: Once | INTRAMUSCULAR | Status: AC | PRN
  Administered 2013-11-06: 100 mL via INTRAVENOUS

## 2013-11-06 MED ORDER — IOHEXOL 300 MG/ML  SOLN
25.0000 mL | INTRAMUSCULAR | Status: AC
  Administered 2013-11-06 (×2): 25 mL via ORAL

## 2013-11-06 MED ORDER — IOHEXOL 350 MG/ML SOLN: 100.0000 mL | Freq: Once | INTRAVENOUS | Status: DC | PRN

## 2013-11-06 NOTE — H&P (Signed)
[facility] History and Physical  Samaa Ueda EXB:[id number] DOB: 01-30-1932 DOA: 11/05/2013  Referring physician: EDP PCP: Elyn Peers, MD  Specialists:   Chief Complaint:    HPI: [name] is a 80 y.o. female who presents to the ED after suffering a syncopal episode while she was using the toilet.  She denies having any nausea or vomiting or ABD pain, and she also denies having fevers or chills.   She reports that she occasionally has syncope while defecating.   She was evaluated in the ED and was found to have a hemoglobin level of 9.4, and on 10/23/2013 her hemoglobin had been 12.  She reports having dark stools due to her Iron therapy and had not noticed any red or burgundy or bloody stools.   She also denies having hematemesis.  She reports that she had been seen by GI Dr. Collene Mares 1 month ago and reports having a normal colonoscopy.       Review of Systems: The patient denies anorexia, fever, chills, headaches, weight loss,, vision loss, diplopia, dizziness, decreased hearing, rhinitis, hoarseness, chest pain, syncope, dyspnea on exertion, peripheral edema, balance deficits, cough, hemoptysis, abdominal pain, nausea, vomiting, diarrhea, constipation, hematemesis, melena, hematochezia, severe indigestion/heartburn, dysuria, hematuria, incontinence, muscle weakness, suspicious skin lesions, transient blindness, difficulty walking, depression, unusual weight change, abnormal bleeding, enlarged lymph nodes, angioedema, and breast masses.    Past Medical History  Diagnosis Date  . Anemia of chronic disease   . Anxiety   . Hypertension   . Hepatitis   . Lipid disorder   . Wears glasses   . Renal insufficiency   . Wears dentures     top  . Breast cancer 08/28/12    IDC left breast bx=invasive ductal ca,ER/PR=+  . Breast cancer 09/01/2012  . Radiation 11/12/12-12/15/11    Left Breast/50 Gy    Past Surgical History  Procedure Laterality Date  . Abdominal hysterectomy        approx 20 years ago  . Appendectomy      20 years ago  . Foot surgery  1992    bone spurs both feet  . Colonoscopy  2009?  [name] Partial mastectomy with needle localization and axillary sentinel lymph node bx  10/08/2012    Procedure: PARTIAL MASTECTOMY WITH NEEDLE LOCALIZATION AND AXILLARY SENTINEL LYMPH NODE BX;  Surgeon: Adin Hector, MD;  Location: [facility];  Service: General;  Laterality: Left;  Left partial mastectomy with Needle localization and Sentinel lymph node biospy    Prior to Admission medications   Medication Sig Start Date End Date Taking? Authorizing Provider  allopurinol (ZYLOPRIM) 100 MG tablet Take 100 mg by mouth daily.    Yes Historical Provider, MD  Amlodipine-Valsartan-HCTZ (EXFORGE HCT) 5-160-12.5 MG TABS Take 1 tablet by mouth daily.   Yes Historical Provider, MD  anastrozole (ARIMIDEX) 1 MG tablet Take 1 tablet (1 mg total) by mouth daily. 04/15/13  Yes Deatra Robinson, MD  calcium carbonate (OS-CAL) 600 MG TABS Take 600 mg by mouth daily.   Yes Historical Provider, MD  Cholecalciferol (VITAMIN D-3) 1000 UNITS CAPS Take 1,000 Units by mouth daily.   Yes Historical Provider, MD  diclofenac sodium (VOLTAREN) 1 % GEL Apply 2 g topically 3 (three) times daily as needed. For arthritis pain   Yes Historical Provider, MD  FeFum-FePoly-FA-B Cmp-C-Biot (INTEGRA PLUS) CAPS Take 1 capsule by mouth daily. 05/24/13  Yes Minette Headland, NP  folic acid (FOLVITE) 102 MCG tablet Take 400-800 mcg  by mouth daily.   Yes Historical Provider, MD  Omega-3 Fatty Acids (FISH OIL) 500 MG CAPS Take 1 capsule by mouth daily.   Yes Historical Provider, MD  vitamin C (ASCORBIC ACID) 500 MG tablet Take 500 mg by mouth daily.   Yes Historical Provider, MD  Wound Cleansers (RADIAPLEX EX) Apply topically.    Historical Provider, MD    No Known Allergies    Social History:  reports that she quit smoking about 6 years ago. Her smoking use included Cigarettes. She smoked  1.00 pack per day. She has never used smokeless tobacco. She reports that she drinks about 0.6 ounces of alcohol per week. She reports that she does not use illicit drugs.     Family History  Problem Relation Age of Onset  . Cancer Father   . Breast cancer Cousin   . Tuberculosis Mother   . Prostate cancer Father     (be sure to complete)   Physical Exam:  GEN:  Pleasant Thin Elderly  80 y.o.African American female  examined  and in no acute distress; cooperative with exam Filed Vitals:   11/05/13 2129 11/05/13 2200 11/05/13 2300 11/05/13 2350  BP: 128/84 104/36 113/38 130/46  Pulse: 88 75 77 87  Temp:    97.6 F (36.4 C)  TempSrc:    Oral  Resp:  16 18 18   Height:    5\' 2"  (1.575 m)  Weight:    61.871 kg (136 lb 6.4 oz)  SpO2:  100% 98% 98%   Blood pressure 130/46, pulse 87, temperature 97.6 F (36.4 C), temperature source Oral, resp. rate 18, height 5\' 2"  (1.575 m), weight 61.871 kg (136 lb 6.4 oz), SpO2 98.00%. PSYCH: SHe is alert and oriented x4; does not appear anxious does not appear depressed; affect is normal HEENT: Normocephalic and Atraumatic, Mucous membranes pink; PERRLA; EOM intact; Fundi:  Benign;  No scleral icterus, Nares: Patent, Oropharynx: Clear, Edentulous or Fair Dentition, Neck:  FROM, no cervical lymphadenopathy nor thyromegaly or carotid bruit; no JVD; Breasts:: Not examined CHEST WALL: No tenderness CHEST: Normal respiration, clear to auscultation bilaterally HEART: Regular rate and rhythm; no murmurs rubs or gallops BACK: No kyphosis or scoliosis; no CVA tenderness ABDOMEN: Positive Bowel Sounds, Scaphoid, Obese, soft non-tender; no masses, no organomegaly, no pannus; no intertriginous candida. Rectal Exam: Not done EXTREMITIES: No bone or joint deformity; age-appropriate arthropathy of the hands and knees; no cyanosis, clubbing or edema; no ulcerations. Genitalia: not examined PULSES: 2+ and symmetric SKIN: Normal hydration no rash or  ulceration CNS: Cranial nerves 2-12 grossly intact no focal neurologic deficit    Labs on Admission:  Basic Metabolic Panel:  Recent Labs Lab 11/05/13 1647  NA 142  K 4.9  CL 103  CO2 27  GLUCOSE 121*  BUN 41*  CREATININE 1.47*  CALCIUM 10.6*   Liver Function Tests:  Recent Labs Lab 11/05/13 1647  AST 44*  ALT 44*  ALKPHOS 88  BILITOT <0.2*  PROT 7.7  ALBUMIN 3.6   No results found for this basename: LIPASE, AMYLASE,  in the last 168 hours No results found for this basename: AMMONIA,  in the last 168 hours CBC:  Recent Labs Lab 11/05/13 1647  WBC 8.8  NEUTROABS 7.0  HGB 9.4*  HCT 27.5*  MCV 99.6  PLT 194   Cardiac Enzymes: No results found for this basename: CKTOTAL, CKMB, CKMBINDEX, TROPONINI,  in the last 168 hours  BNP (last 3 results) No results found for this  basename: PROBNP,  in the last 8760 hours CBG: No results found for this basename: GLUCAP,  in the last 168 hours  Radiological Exams on Admission: No results found.   EKG: Independently reviewed.    Assessment/Plan Principal Problem:   Anemia Active Problems:   Syncope   Hypertension   Breast cancer   Renal insufficiency    1.   Anemia-   Due to acute versus Chronic Blood loss- Placed on IV Protonix, and Check H/hs q 8 hrs X 6 and transfuse if needed.  Type and Screen sent.   Her Gastroenterologist is Dr. Collene Mares.     She had a colonoscopy 1 month ago and reports having a Upper Endoscopy a few months ago.    2.   Syncope-  Has a history of syncope after defecating, due to Vasovagal Response.   Syncope workup initiated, neuro checks and orthostatic vitals ordered, and cycle troponins.     3.   Hypertension- Monitor Blood Pressures.   Continue Exforge as BP tolerates.     4.   Breast Cancer Hx-  continue Tamoxifen Rx.    5.   Renal Insufficiency-  Moniotr BUN/Cr levels.     6.   SCDs for DVT Prophylaxis.        Code Status:   FULL CODE Family Communication:   Sons at  Bedside  Disposition Plan:    Inpatient      Time spent:  [facility] Pager [phone]  If 7PM-7AM, please contact night-coverage [email or web address] Password [facility] 11/06/2013, 12:48 AM

## 2013-11-06 NOTE — Consult Note (Signed)
Referring Provider: Dr. Georgianne Fick Hospitalist Primary Care Physician:  Elyn Peers, MD Primary Gastroenterologist:  Dr. Verdia Kuba  Reason for Consultation:  Syncope, anemia, heme positive stool  HPI: [name] is a 80 y.o. female known to Dr. Meriel Pica with history of iron deficiency anemia. Patient was admitted through the emergency room last evening after she presented with a syncopal episode while on the commode. She had stated that this had happened at least once in the past. She was noted to be anemic in the emergency room with a hemoglobin of 9.4 and last hemoglobin earlier this month on January 3 was 12. Patient is on chronic iron therapy and states that her stools stay dark and she has not noticed any changes recently. She does admit to feeling increased fatigue and malaise over the past couple of weeks. Her appetite has been fine she's not had any nausea or vomiting. She's not on any regular aspirin or NSAIDs. She does occasionally get a lower abdominal pain on the right side that radiates around into her back and has had some of this this morning. She says she has not had any abdominal pain over the past few weeks Stool has been documented to be Hemoccult-positive in this morning her hemoglobin has dropped further to 8.4. Colonoscopy in August of 2014 per Dr. Collene Mares showed 3 small polyps all of which were removed otherwise negative exam, and EGD also done in August of 2014 showed a moderate diffuse chronic gastritis. Other medical problems include hyperlipidemia and history of breast cancer 2013 status post partial mastectomy and radiation therapy.   Past Medical History  Diagnosis Date  . Anemia of chronic disease   . Anxiety   . Hypertension   . Hepatitis   . Lipid disorder   . Wears glasses   . Renal insufficiency   . Wears dentures     top  . Breast cancer 08/28/12    IDC left breast bx=invasive ductal ca,ER/PR=+  . Breast cancer 09/01/2012  . Radiation  11/12/12-12/15/11    Left Breast/50 Gy    Past Surgical History  Procedure Laterality Date  . Abdominal hysterectomy       approx 20 years ago  . Appendectomy      20 years ago  . Foot surgery  1992    bone spurs both feet  . Colonoscopy  2009?  [name] Partial mastectomy with needle localization and axillary sentinel lymph node bx  10/08/2012    Procedure: PARTIAL MASTECTOMY WITH NEEDLE LOCALIZATION AND AXILLARY SENTINEL LYMPH NODE BX;  Surgeon: Adin Hector, MD;  Location: [facility];  Service: General;  Laterality: Left;  Left partial mastectomy with Needle localization and Sentinel lymph node biospy    Prior to Admission medications   Medication Sig Start Date End Date Taking? Authorizing Provider  allopurinol (ZYLOPRIM) 100 MG tablet Take 100 mg by mouth daily.    Yes Historical Provider, MD  Amlodipine-Valsartan-HCTZ (EXFORGE HCT) 5-160-12.5 MG TABS Take 1 tablet by mouth daily.   Yes Historical Provider, MD  anastrozole (ARIMIDEX) 1 MG tablet Take 1 tablet (1 mg total) by mouth daily. 04/15/13  Yes Deatra Robinson, MD  calcium carbonate (OS-CAL) 600 MG TABS Take 600 mg by mouth daily.   Yes Historical Provider, MD  Cholecalciferol (VITAMIN D-3) 1000 UNITS CAPS Take 1,000 Units by mouth daily.   Yes Historical Provider, MD  diclofenac sodium (VOLTAREN) 1 % GEL Apply 2 g topically 3 (three) times daily as needed.  For arthritis pain   Yes Historical Provider, MD  FeFum-FePoly-FA-B Cmp-C-Biot (INTEGRA PLUS) CAPS Take 1 capsule by mouth daily. 05/24/13  Yes Minette Headland, NP  folic acid (FOLVITE) A999333 MCG tablet Take 400-800 mcg by mouth daily.   Yes Historical Provider, MD  Omega-3 Fatty Acids (FISH OIL) 500 MG CAPS Take 1 capsule by mouth daily.   Yes Historical Provider, MD  vitamin C (ASCORBIC ACID) 500 MG tablet Take 500 mg by mouth daily.   Yes Historical Provider, MD  Wound Cleansers (RADIAPLEX EX) Apply topically.    Historical Provider, MD    Current  Facility-Administered Medications  Medication Dose Route Frequency Provider Last Rate Last Dose  . 0.9 %  sodium chloride infusion   Intravenous Continuous Theressa Millard, MD 75 mL/hr at 11/06/13 0011    . acetaminophen (TYLENOL) tablet 650 mg  650 mg Oral Q6H PRN Theressa Millard, MD       Or  . acetaminophen (TYLENOL) suppository 650 mg  650 mg Rectal Q6H PRN Theressa Millard, MD      . allopurinol (ZYLOPRIM) tablet 100 mg  100 mg Oral Daily Theressa Millard, MD   100 mg at 11/06/13 1014  . alum & mag hydroxide-simeth (MAALOX/MYLANTA) 200-200-20 MG/5ML suspension 30 mL  30 mL Oral Q6H PRN Theressa Millard, MD      . anastrozole (ARIMIDEX) tablet 1 mg  1 mg Oral Daily Theressa Millard, MD   1 mg at 11/06/13 1014  . calcium carbonate (OS-CAL - dosed in mg of elemental calcium) tablet 500 mg of elemental calcium  1 tablet Oral Daily Theressa Millard, MD   500 mg of elemental calcium at 11/06/13 1014  . cholecalciferol (VITAMIN D) tablet 1,000 Units  1,000 Units Oral Daily Theressa Millard, MD   1,000 Units at 11/06/13 1014  . HYDROmorphone (DILAUDID) injection 0.5-1 mg  0.5-1 mg Intravenous Q3H PRN Theressa Millard, MD      . omega-3 acid ethyl esters (LOVAZA) capsule 1 g  1 g Oral Daily Theressa Millard, MD   1 g at 11/06/13 1014  . ondansetron (ZOFRAN) tablet 4 mg  4 mg Oral Q6H PRN Theressa Millard, MD       Or  . ondansetron (ZOFRAN) injection 4 mg  4 mg Intravenous Q6H PRN Theressa Millard, MD      . oxyCODONE (Oxy IR/ROXICODONE) immediate release tablet 5 mg  5 mg Oral Q4H PRN Theressa Millard, MD      . pantoprazole (PROTONIX) injection 40 mg  40 mg Intravenous Q12H Theressa Millard, MD   40 mg at 11/06/13 1014  . sodium chloride 0.9 % injection 3 mL  3 mL Intravenous Q12H Theressa Millard, MD   3 mL at 11/06/13 1014  . zolpidem (AMBIEN) tablet 5 mg  5 mg Oral QHS PRN Theressa Millard, MD        Allergies as of 11/05/2013  . (No Known Allergies)     Family History  Problem Relation Age of Onset  . Cancer Father   . Breast cancer Cousin   . Tuberculosis Mother   . Prostate cancer Father     History   Social History  . Marital Status: Widowed    Spouse Name: N/A    Number of Children: 3  . Years of Education: N/A   Occupational History  .     Social History Main Topics  . Smoking status:  Former Smoker -- 1.00 packs/day    Types: Cigarettes    Quit date: 10/27/2007  . Smokeless tobacco: Never Used     Comment: started smoking age 29  . Alcohol Use: 0.6 oz/week    1 Glasses of wine per week     Comment:  glass wine per week  . Drug Use: No  . Sexual Activity: Not Currently   Other Topics Concern  . Not on file   Social History Narrative   Widowed lives with one of her sons   3 sons and 2 daughters   Menses age 56 or 67   Age 57 with first child   No breast feeding   No HRT    Review of Systems: Pertinent positive and negative review of systems were noted in the above HPI section.  All other review of systems was otherwise negative.[name]  Physical Exam: Vital signs in last 24 hours: Temp:  [97.6 F (36.4 C)-98.7 F (37.1 C)] 98.7 F (37.1 C) (01/17 1148) Pulse Rate:  [69-88] 73 (01/17 1148) Resp:  [16-22] 18 (01/17 0400) BP: (104-130)/(35-84) 112/39 mmHg (01/17 1148) SpO2:  [92 %-100 %] 98 % (01/17 1148) Weight:  [135 lb (61.236 kg)-136 lb 6.4 oz (61.871 kg)] 136 lb 6.4 oz (61.871 kg) (01/17 0400)   General:   Alert,  Well-developed, well-nourished,elderly AA female, pleasant and cooperative in NAD Head:  Normocephalic and atraumatic. Eyes:  Sclera clear, no icterus.   Conjunctiva pink. Ears:  Normal auditory acuity. Nose:  No deformity, discharge,  or lesions. Mouth:  No deformity or lesions.   Neck:  Supple; no masses or thyromegaly. Lungs:  Clear throughout to auscultation.   No wheezes, crackles, or rhonchi. Heart:  Regular rate and rhythm; no murmurs, clicks, rubs,  or gallops. Abdomen:   Soft,nontender, BS active,nonpalp mass or hsm.   Rectal:  Deferred -documented hem positive  Msk:  Symmetrical without gross deformities. . Pulses:  Normal pulses noted. Extremities:  Without clubbing or edema. Neurologic:  Alert and  oriented x4;  grossly normal neurologically. Skin:  Intact without significant lesions or rashes.. Psych:  Alert and cooperative. Normal mood and affect.  Intake/Output from previous day:   Intake/Output this shift: Total I/O In: 3 [I.V.:3] Out: 3 [Urine:2; Stool:1]  Lab Results:  Recent Labs  11/05/13 1647 11/06/13 0515 11/06/13 0840  WBC 8.8 6.1 5.7  HGB 9.4* 8.2* 8.4*  HCT 27.5* 24.0* 24.5*  PLT 194 163 161   BMET  Recent Labs  11/05/13 1647 11/06/13 0840  NA 142 142  K 4.9 4.0  CL 103 108  CO2 27 21  GLUCOSE 121* 143*  BUN 41* 28*  CREATININE 1.47* 1.12*  CALCIUM 10.6* 9.1   LFT  Recent Labs  11/05/13 1647  PROT 7.7  ALBUMIN 3.6  AST 44*  ALT 44*  ALKPHOS 88  BILITOT <0.2*    Studies/Results: CTabd/pelvis -pending    IMPRESSION:  #73  80 year old female with history of iron deficiency anemia now presenting with a syncopal episode while on the commode which may have been vasovagal but also noted to have a 3 g drop in her hemoglobin over the past couple of weeks. Patient documented Hemoccult positive without evidence of overt bleeding. Workup about 6 months ago with EGD and colonoscopy with finding of moderate diffuse gastritis which may be a potential cause for chronic GI blood loss. Patient also had colon polyps which were removed. Suspect patient has been bleeding slowly over the past few weeks,  etiology not clear. Rule out secondary to diffuse gastritis versus small bowel source i.e. AVMs etc. #2 history of breast cancer #3 hypertension #4 renal insufficiency #5 hyperlipidemia  PLAN: Transfuse for hemoglobin 8 or less PPI daily CT scan of the abdomen/pelvis is pending today Will plan for repeat EGD and small  bowel enteroscopy on Monday with Drs. Mann/Hung. Procedure discussed with  the patient and she is agreeable to proceed. If enteroscopy unrevealing she will need capsule endoscopy.   Amy Esterwood  11/06/2013, 12:41 PM     Attending physician's note   I have taken a history, examined the patient and reviewed the chart. I agree with the Advanced Practitioner's note, impression and recommendations. Fe def anemia with 3g decrease in Hb over past few weeks and heme + stool. Await CT result today. Monitor Hb/Hct. PPI daily. EGD/enteroscopy with Drs. Mann/Hung on Monday. Consider capsule endoscopy if above EGD/enteroscopy is not diagnostic.  Ladene Artist, MD [facility]

## 2013-11-06 NOTE — ED Provider Notes (Signed)
CSN: [id number]     Arrival date & time 11/05/13  91 History   First MD Initiated Contact with Patient 11/05/13 2030     Chief Complaint  Patient presents with  . Weakness   (Consider location/radiation/quality/duration/timing/severity/associated sxs/prior Treatment) Patient is a 80 y.o. female presenting with weakness and syncope. The history is provided by the patient and a relative.  Weakness This is a recurrent problem. The current episode started more than 2 days ago. The problem occurs constantly. The problem has not changed since onset.Pertinent negatives include no chest pain, no abdominal pain, no headaches and no shortness of breath. The symptoms are aggravated by exertion. The symptoms are relieved by rest. She has tried nothing for the symptoms. The treatment provided no relief.  Loss of Consciousness Episode history:  Single Most recent episode:  Today Timing:  Rare Progression:  Resolved Chronicity:  Recurrent Context: bowel movement and standing up   Witnessed: no   Relieved by:  Lying down Worsened by:  Nothing tried Ineffective treatments:  None tried Associated symptoms: diaphoresis, dizziness, malaise/fatigue, palpitations, visual change and weakness   Associated symptoms: no anxiety, no chest pain, no confusion, no difficulty breathing, no fever, no focal sensory loss, no focal weakness, no headaches, no nausea, no recent fall, no recent surgery, no rectal bleeding, no seizures, no shortness of breath and no vomiting   Visual Change:    Location:  Both eyes   Quality: decreased vision     Onset quality:  Sudden   Severity:  Severe   Duration: seconds.   Timing:  Rare   Progression:  Resolved   Chronicity:  New   Past Medical History  Diagnosis Date  . Anemia of chronic disease   . Anxiety   . Hypertension   . Hepatitis   . Lipid disorder   . Wears glasses   . Renal insufficiency   . Wears dentures     top  . Breast cancer 08/28/12    IDC left breast  bx=invasive ductal ca,ER/PR=+  . Breast cancer 09/01/2012  . Radiation 11/12/12-12/15/11    Left Breast/50 Gy   Past Surgical History  Procedure Laterality Date  . Abdominal hysterectomy       approx 20 years ago  . Appendectomy      20 years ago  . Foot surgery  1992    bone spurs both feet  . Colonoscopy  2009?  [name] Partial mastectomy with needle localization and axillary sentinel lymph node bx  10/08/2012    Procedure: PARTIAL MASTECTOMY WITH NEEDLE LOCALIZATION AND AXILLARY SENTINEL LYMPH NODE BX;  Surgeon: Adin Hector, MD;  Location: [facility];  Service: General;  Laterality: Left;  Left partial mastectomy with Needle localization and Sentinel lymph node biospy   Family History  Problem Relation Age of Onset  . Cancer Father   . Breast cancer Cousin   . Tuberculosis Mother   . Prostate cancer Father    History  Substance Use Topics  . Smoking status: Former Smoker -- 1.00 packs/day    Types: Cigarettes    Quit date: 10/27/2007  . Smokeless tobacco: Never Used     Comment: started smoking age 26  . Alcohol Use: 0.6 oz/week    1 Glasses of wine per week     Comment:  glass wine per week   OB History   Grav Para Term Preterm Abortions TAB SAB Ect Mult Living  Review of Systems  Constitutional: Positive for malaise/fatigue and diaphoresis. Negative for fever, chills, activity change, appetite change and fatigue.  HENT: Negative for congestion, facial swelling, rhinorrhea and sore throat.   Eyes: Negative for photophobia and discharge.  Respiratory: Negative for cough, chest tightness and shortness of breath.   Cardiovascular: Positive for palpitations and syncope. Negative for chest pain and leg swelling.  Gastrointestinal: Negative for nausea, vomiting, abdominal pain and diarrhea.  Endocrine: Negative for polydipsia and polyuria.  Genitourinary: Negative for dysuria, frequency, difficulty urinating and pelvic pain.  Musculoskeletal:  Negative for arthralgias, back pain, neck pain and neck stiffness.  Skin: Negative for color change and wound.  Allergic/Immunologic: Negative for immunocompromised state.  Neurological: Positive for dizziness and weakness. Negative for focal weakness, seizures, facial asymmetry, numbness and headaches.  Hematological: Does not bruise/bleed easily.  Psychiatric/Behavioral: Negative for confusion and agitation.    Allergies  Review of patient's allergies indicates no known allergies.  Home Medications  No current outpatient prescriptions on file. BP 112/39  Pulse 73  Temp(Src) 98.7 F (37.1 C) (Oral)  Resp 18  Ht 5\' 2"  (1.575 m)  Wt 136 lb 6.4 oz (61.871 kg)  BMI 24.94 kg/m2  SpO2 98% Physical Exam  Constitutional: She is oriented to person, place, and time. She appears well-developed and well-nourished. No distress.  HENT:  Head: Normocephalic and atraumatic.  Mouth/Throat: No oropharyngeal exudate.  Eyes: Pupils are equal, round, and reactive to light.  Neck: Normal range of motion. Neck supple.  Cardiovascular: Normal rate, regular rhythm and normal heart sounds.  Exam reveals no gallop and no friction rub.   No murmur heard. Pulmonary/Chest: Effort normal and breath sounds normal. No respiratory distress. She has no wheezes. She has no rales.  Abdominal: Soft. Bowel sounds are normal. She exhibits no distension and no mass. There is no tenderness. There is no rebound and no guarding.  Genitourinary: Guaiac positive stool.  Musculoskeletal: Normal range of motion. She exhibits no edema and no tenderness.  Neurological: She is alert and oriented to person, place, and time.  Skin: Skin is warm and dry.  Psychiatric: She has a normal mood and affect.    ED Course  Procedures (including critical care time) Labs Review Labs Reviewed  URINALYSIS, ROUTINE W REFLEX MICROSCOPIC - Abnormal; Notable for the following:    Leukocytes, UA SMALL (*)    All other components within  normal limits  CBC WITH DIFFERENTIAL - Abnormal; Notable for the following:    RBC 2.76 (*)    Hemoglobin 9.4 (*)    HCT 27.5 (*)    MCH 34.1 (*)    Neutrophils Relative % 79 (*)    All other components within normal limits  COMPREHENSIVE METABOLIC PANEL - Abnormal; Notable for the following:    Glucose, Bld 121 (*)    BUN 41 (*)    Creatinine, Ser 1.47 (*)    Calcium 10.6 (*)    AST 44 (*)    ALT 44 (*)    Total Bilirubin <0.2 (*)    GFR calc non Af Amer 33 (*)    GFR calc Af Amer 38 (*)    All other components within normal limits  URINE MICROSCOPIC-ADD ON - Abnormal; Notable for the following:    Casts HYALINE CASTS (*)    All other components within normal limits  CBC - Abnormal; Notable for the following:    RBC 2.41 (*)    Hemoglobin 8.2 (*)    HCT 24.0 (*)  All other components within normal limits  CBC - Abnormal; Notable for the following:    RBC 2.45 (*)    Hemoglobin 8.4 (*)    HCT 24.5 (*)    MCH 34.3 (*)    All other components within normal limits  BASIC METABOLIC PANEL - Abnormal; Notable for the following:    Glucose, Bld 143 (*)    BUN 28 (*)    Creatinine, Ser 1.12 (*)    GFR calc non Af Amer 45 (*)    GFR calc Af Amer 53 (*)    All other components within normal limits  OCCULT BLOOD, POC DEVICE - Abnormal; Notable for the following:    Fecal Occult Bld POSITIVE (*)    All other components within normal limits  HEMOGLOBIN AND HEMATOCRIT, BLOOD  HEMOGLOBIN AND HEMATOCRIT, BLOOD  TROPONIN I  TROPONIN I  TROPONIN I  CORTISOL  VITAMIN B12  FOLATE  IRON AND TIBC  FERRITIN  RETICULOCYTES  SAMPLE TO BLOOD BANK   Imaging Review No results found.  EKG Interpretation    Date/Time:  Friday November 05 2013 16:25:26 EST Ventricular Rate:  84 PR Interval:  186 QRS Duration: 80 QT Interval:  332 QTC Calculation: 392 R Axis:   16 Text Interpretation:  Normal sinus rhythm Nonspecific ST abnormality Abnormal ECG No significant change was found  Confirmed by Khaled Herda  MD, Arlina Sabina ([id number]) on 11/05/2013 8:50:23 PM            MDM   1. Syncope   2. Anemia   3. Heme + stool   4. Weakness   5. Hypertension   6. Lipid disorder   7. Renal insufficiency    Pt is a 80 y.o. female with Pmhx as above who presents with 2-3 days of generalized weakness, fatigue with syncope episode today shortly after BM.  She reports chronically dark stool as she is on iron.  Has seen no blood in stool or urine.  On PE. Aside from 2 low BP readings, VSS, pt in NAD.  Cardiopulm & ab exam benign. She has had a hb drop of 12 to 9.4 from 1/2 to today.  Stool heme positive.  Have consulted [facility] for admission for syncope, anemia.         Neta Ehlers, MD 11/06/13 1158

## 2013-11-06 NOTE — Progress Notes (Signed)
Patient ID: [name]  female  QAS:[id number]    DOB: 04-Mar-1932    DOA: 11/05/2013  PCP: Elyn Peers, MD  Assessment/Plan: Principal Problem:   Syncope: - Likely vasovagal after defecating, orthostatic vital signs negative this morning - Will get troponin, 2-D echo for further workup  Active Problems:   Anemia with right lower quadrant abdominal pain - Obtain CT abdomen pelvis, patient had EGD in 8/14 which had shown normal esophagus and duodenum, moderate chronic gastritis. - Patient reports that she does not take any NSAIDs - She had a colonoscopy in 8/14, which was essentially normal as well - FOBT is positive, currently patient is on PPI, ? Slow oozing/diverticular. Hemoglobin is stable at 8.4,  Will continue serial H&H. If hemoglobin is trending down, we'll discuss with GI - Patient has chronically elevated LFTs, mild    Hypertension: Currently stable    Breast cancer continue tamoxifen treatment    Renal insufficiency- currently improved, stable  DVT Prophylaxis: SCDs  Code Status:  Family Communication:  Disposition:    Subjective: Patient reports mild right lower quadrant abdominal pain radiating to her back  Objective: Weight change:   Intake/Output Summary (Last 24 hours) at 11/06/13 1142 Last data filed at 11/06/13 1014  Gross per 24 hour  Intake      3 ml  Output      3 ml  Net      0 ml   Blood pressure 127/49, pulse 77, temperature 97.8 F (36.6 C), temperature source Oral, resp. rate 18, height 5\' 2"  (1.575 m), weight 61.871 kg (136 lb 6.4 oz), SpO2 92.00%.  Physical Exam: General: Alert and awake, oriented x3, not in any acute distress. CVS: S1-S2 clear, no murmur rubs or gallops Chest: clear to auscultation bilaterally, no wheezing, rales or rhonchi Abdomen: Mild tenderness and right lower quadrant, soft nondistended, normal bowel sounds  Extremities: no cyanosis, clubbing or edema noted bilaterally Neuro: Cranial nerves II-XII  intact, no focal neurological deficits  Lab Results: Basic Metabolic Panel:  Recent Labs Lab 11/05/13 1647 11/06/13 0840  NA 142 142  K 4.9 4.0  CL 103 108  CO2 27 21  GLUCOSE 121* 143*  BUN 41* 28*  CREATININE 1.47* 1.12*  CALCIUM 10.6* 9.1   Liver Function Tests:  Recent Labs Lab 11/05/13 1647  AST 44*  ALT 44*  ALKPHOS 88  BILITOT <0.2*  PROT 7.7  ALBUMIN 3.6   No results found for this basename: LIPASE, AMYLASE,  in the last 168 hours No results found for this basename: AMMONIA,  in the last 168 hours CBC:  Recent Labs Lab 11/05/13 1647 11/06/13 0515 11/06/13 0840  WBC 8.8 6.1 5.7  NEUTROABS 7.0  --   --   HGB 9.4* 8.2* 8.4*  HCT 27.5* 24.0* 24.5*  MCV 99.6 99.6 100.0  PLT 194 163 161   Cardiac Enzymes: No results found for this basename: CKTOTAL, CKMB, CKMBINDEX, TROPONINI,  in the last 168 hours BNP: No components found with this basename: POCBNP,  CBG: No results found for this basename: GLUCAP,  in the last 168 hours   Micro Results: No results found for this or any previous visit (from the past 240 hour(s)).  Studies/Results: No results found.  Medications: Scheduled Meds: . sodium chloride   Intravenous STAT  . allopurinol  100 mg Oral Daily  . anastrozole  1 mg Oral Daily  . calcium carbonate  1 tablet Oral Daily  . cholecalciferol  1,000 Units Oral Daily  .  omega-3 acid ethyl esters  1 g Oral Daily  . pantoprazole (PROTONIX) IV  40 mg Intravenous Q12H  . sodium chloride  3 mL Intravenous Q12H      LOS: 1 day   Teryl Gubler M.D. [facility] 11/06/2013, 11:42 AM Pager: [phone]  If 7PM-7AM, please contact night-coverage [email or web address] Password [email or web address]

## 2013-11-07 DIAGNOSIS — I369 Nonrheumatic tricuspid valve disorder, unspecified: Secondary | ICD-10-CM

## 2013-11-07 LAB — HEMOGLOBIN AND HEMATOCRIT, BLOOD
HCT: 27.1 % — ABNORMAL LOW (ref 36.0–46.0)
HCT: 24.3 % — ABNORMAL LOW (ref 36.0–46.0)
HEMATOCRIT: 26.1 % — AB (ref 36.0–46.0)
Hemoglobin: 9.4 g/dL — ABNORMAL LOW (ref 12.0–15.0)
Hemoglobin: 8.9 g/dL — ABNORMAL LOW (ref 12.0–15.0)
Hemoglobin: 8.5 g/dL — ABNORMAL LOW (ref 12.0–15.0)

## 2013-11-07 LAB — BASIC METABOLIC PANEL
BUN: 14 mg/dL (ref 6–23)
CO2: 21 meq/L (ref 19–32)
CREATININE: 1.11 mg/dL — AB (ref 0.50–1.10)
Calcium: 9.6 mg/dL (ref 8.4–10.5)
Chloride: 103 mEq/L (ref 96–112)
GFR calc Af Amer: 53 mL/min — ABNORMAL LOW (ref >90)
GFR calc non Af Amer: 46 mL/min — ABNORMAL LOW (ref >90)
GLUCOSE: 99 mg/dL (ref 70–99)
Potassium: 4.4 mEq/L (ref 3.7–5.3)
Sodium: 141 mEq/L (ref 137–147)

## 2013-11-07 LAB — TROPONIN I: Troponin I: <0.3 ng/mL (ref <0.30)

## 2013-11-07 MED ORDER — FLUTICASONE PROPIONATE 50 MCG/ACT NA SUSP
2.0000 | Freq: Every day | NASAL | Status: DC
  Administered 2013-11-07 – 2013-11-09 (×3): 2 via NASAL
  Filled 2013-11-07: qty 16

## 2013-11-07 MED ORDER — LORATADINE 10 MG PO TABS
10.0000 mg | ORAL_TABLET | Freq: Every day | ORAL | Status: DC
  Administered 2013-11-07 – 2013-11-09 (×3): 10 mg via ORAL
  Filled 2013-11-07 (×3): qty 1

## 2013-11-07 NOTE — Progress Notes (Signed)
Patient ID: [name]  female  WFU:[id number]    DOB: 1931/12/12    DOA: 11/05/2013  PCP: Elyn Peers, MD  Assessment/Plan: Principal Problem:   Syncope: - Likely vasovagal after defecating, orthostatic vital signs negative this morning - 2-D echo showed EF of 60-65%, focal basal hypertrophy, no regional wall motion abnormalities  Active Problems:   Anemia with right lower quadrant abdominal pain - CT abdomen pelvis showed no acute intra-abdominal abnormality - patient had EGD in 8/14 which had shown normal esophagus and duodenum, moderate chronic gastritis. colonoscopy in 8/14, which was essentially normal as well - Patient reports that she does not take any NSAIDs - FOBT is positive, currently patient is on PPI, GI consulted, recommended EGD/enteroscopy Monday    Hypertension: Currently stable    Breast cancer continue tamoxifen treatment    Renal insufficiency- currently improved, stable  DVT Prophylaxis: SCDs  Code Status:  Family Communication:  Disposition: Pending GI workup    Subjective: Denies any specific complaints today, no abdominal pain, chest pain, shortness of breath  Objective: Weight change:   Intake/Output Summary (Last 24 hours) at 11/07/13 1227 Last data filed at 11/07/13 0900  Gross per 24 hour  Intake    360 ml  Output      0 ml  Net    360 ml   Blood pressure 113/64, pulse 66, temperature 98.4 F (36.9 C), temperature source Oral, resp. rate 16, height 5\' 2"  (1.575 m), weight 61.871 kg (136 lb 6.4 oz), SpO2 98.00%.  Physical Exam: General: Alert and awake, oriented x3, not in any acute distress. CVS: S1-S2 clear, no murmur rubs or gallops Chest: clear to auscultation bilaterally, no wheezing, rales or rhonchi Abdomen: NT, soft nondistended, normal bowel sounds  Extremities: no cyanosis, clubbing or edema noted bilaterally   Lab Results: Basic Metabolic Panel:  Recent Labs Lab 11/06/13 0840 11/07/13 0515  NA 142 141  K  4.0 4.4  CL 108 103  CO2 21 21  GLUCOSE 143* 99  BUN 28* 14  CREATININE 1.12* 1.11*  CALCIUM 9.1 9.6   Liver Function Tests:  Recent Labs Lab 11/05/13 1647  AST 44*  ALT 44*  ALKPHOS 88  BILITOT <0.2*  PROT 7.7  ALBUMIN 3.6   No results found for this basename: LIPASE, AMYLASE,  in the last 168 hours No results found for this basename: AMMONIA,  in the last 168 hours CBC:  Recent Labs Lab 11/05/13 1647 11/06/13 0515 11/06/13 0840  11/06/13 2119 11/07/13 0515  WBC 8.8 6.1 5.7  --   --   --   NEUTROABS 7.0  --   --   --   --   --   HGB 9.4* 8.2* 8.4*  < > 8.7* 9.4*  HCT 27.5* 24.0* 24.5*  < > 24.7* 27.1*  MCV 99.6 99.6 100.0  --   --   --   PLT 194 163 161  --   --   --   < > = values in this interval not displayed. Cardiac Enzymes:  Recent Labs Lab 11/06/13 1320 11/06/13 1656 11/07/13 0005  TROPONINI <0.30 <0.30 <0.30   BNP: No components found with this basename: POCBNP,  CBG: No results found for this basename: GLUCAP,  in the last 168 hours   Micro Results: No results found for this or any previous visit (from the past 240 hour(s)).  Studies/Results: No results found.  Medications: Scheduled Meds: . allopurinol  100 mg Oral Daily  . anastrozole  1 mg Oral Daily  . calcium carbonate  1 tablet Oral Daily  . cholecalciferol  1,000 Units Oral Daily  . omega-3 acid ethyl esters  1 g Oral Daily  . pantoprazole (PROTONIX) IV  40 mg Intravenous Q12H  . sodium chloride  3 mL Intravenous Q12H      LOS: 2 days   Ranika Mcniel M.D. [facility] 11/07/2013, 12:27 PM Pager: [phone]  If 7PM-7AM, please contact night-coverage [email or web address] Password [email or web address]  

## 2013-11-07 NOTE — Progress Notes (Signed)
Covering for Dr. Collene Mares  Progress Note   Subjective  Feels weak. Stools dark like they have been on Fe   Objective  Vital signs in last 24 hours: Temp:  [98.2 F (36.8 C)-98.7 F (37.1 C)] 98.4 F (36.9 C) (01/18 0400) Pulse Rate:  [66-74] 66 (01/18 0400) Resp:  [15-16] 16 (01/18 0400) BP: (108-120)/(32-67) 113/64 mmHg (01/18 0400) SpO2:  [98 %-99 %] 98 % (01/18 0400)   General:   Alert, well-developed, female in NAD Heart:  Regular rate and rhythm; no murmurs Abdomen:  Soft, nontender and nondistended. Normal bowel sounds, without guarding, and without rebound.   Extremities:  Without edema. Neurologic:  Alert and  oriented x4;  grossly normal neurologically. Psych:  Alert and cooperative. Normal mood and affect.  Intake/Output from previous day: 01/17 0701 - 01/18 0700 In: 498 [P.O.:120; I.V.:378] Out: 3 [Urine:2; Stool:1] Intake/Output this shift: Total I/O In: 240 [P.O.:240] Out: -   Lab Results:  Recent Labs  11/05/13 1647 11/06/13 0515 11/06/13 0840 11/06/13 1230 11/06/13 2119 11/07/13 0515  WBC 8.8 6.1 5.7  --   --   --   HGB 9.4* 8.2* 8.4* 8.3* 8.7* 9.4*  HCT 27.5* 24.0* 24.5* 24.0* 24.7* 27.1*  PLT 194 163 161  --   --   --    BMET  Recent Labs  11/05/13 1647 11/06/13 0840 11/07/13 0515  NA 142 142 141  K 4.9 4.0 4.4  CL 103 108 103  CO2 27 21 21   GLUCOSE 121* 143* 99  BUN 41* 28* 14  CREATININE 1.47* 1.12* 1.11*  CALCIUM 10.6* 9.1 9.6   LFT  Recent Labs  11/05/13 1647  PROT 7.7  ALBUMIN 3.6  AST 44*  ALT 44*  ALKPHOS 88  BILITOT <0.2*   Studies/Results: Ct Abdomen Pelvis W Contrast  11/06/2013   CLINICAL DATA:  Right-sided abdominal pain and flank pain. Diaphoresis and near syncope. History of left breast cancer, status post radiation therapy.  EXAM: CT ABDOMEN AND PELVIS WITH CONTRAST  TECHNIQUE: Multidetector CT imaging of the abdomen and pelvis was performed using the standard protocol following bolus administration of  intravenous contrast.  CONTRAST:  174mL OMNIPAQUE IOHEXOL 300 MG/ML  SOLN  COMPARISON:  CT of the abdomen and pelvis performed 05/05/2008  FINDINGS: Emphysematous change is noted at the lung bases.  The liver and spleen are unremarkable in appearance. The gallbladder is within normal limits. The pancreas and adrenal glands are unremarkable.  The kidneys are unremarkable in appearance. There is no evidence of hydronephrosis. No renal or ureteral stones are seen. No perinephric stranding is appreciated.  No free fluid is identified. The small bowel is unremarkable in appearance. The stomach is within normal limits. No acute vascular abnormalities are seen. Relatively diffuse calcification is noted along the abdominal aorta and its branches.  The patient is status post appendectomy. Contrast progresses to the level of the sigmoid colon. The colon is unremarkable in appearance.  The bladder is mildly distended and grossly unremarkable. The patient is status post hysterectomy. No suspicious adnexal masses are seen. No inguinal lymphadenopathy is seen.  No acute osseous abnormalities are identified. Disc space narrowing and vacuum phenomenon are seen at L4-L5. Facet disease is noted along the lumbar spine.  IMPRESSION: 1. No acute abnormality seen within the abdomen or pelvis. 2. Relatively diffuse calcification along the abdominal aorta and its branches. 3. Emphysematous change at the lung bases.   Electronically Signed   By: Francoise Schaumann.D.  On: 11/06/2013 22:03     Assessment & Plan   1. Worsening Fe deficiency anemia with heme + dark stool on Fe. Hb now 9.4. CT showing calcifications of abd aorta and its branches. Plan for EGD/enteroscopy Monday with Drs Mann/Hung  Principal Problem:   Syncope Active Problems:   Hypertension   Breast cancer   Renal insufficiency   Anemia   Nonspecific abnormal finding in stool contents    LOS: 2 days   Norberto Sorenson T. Fuller Plan MD [facility]  11/07/2013, 10:33 AM

## 2013-11-07 NOTE — Progress Notes (Signed)
  Echocardiogram 2D Echocardiogram has been performed.  [name] 11/07/2013, 10:05 AM

## 2013-11-08 ENCOUNTER — Encounter (HOSPITAL_COMMUNITY): Payer: Self-pay

## 2013-11-08 ENCOUNTER — Encounter (HOSPITAL_COMMUNITY)
Admission: EM | Disposition: A | Discharge status: Home / Self Care | Payer: Self-pay | Source: Home / Self Care | Attending: Emergency Medicine

## 2013-11-08 DIAGNOSIS — D638 Anemia in other chronic diseases classified elsewhere: Secondary | ICD-10-CM

## 2013-11-08 DIAGNOSIS — R55 Syncope and collapse: Principal | ICD-10-CM

## 2013-11-08 HISTORY — PX: ENTEROSCOPY: SHX5533

## 2013-11-08 SURGERY — ENTEROSCOPY
Anesthesia: Moderate Sedation

## 2013-11-08 SURGERY — EGD (ESOPHAGOGASTRODUODENOSCOPY)
Anesthesia: Moderate Sedation

## 2013-11-08 MED ORDER — DIPHENHYDRAMINE HCL 50 MG/ML IJ SOLN
INTRAMUSCULAR | Status: AC
  Filled 2013-11-08: qty 1

## 2013-11-08 MED ORDER — LORATADINE 10 MG PO TABS: 10.0000 mg | ORAL_TABLET | Freq: Every day | ORAL | Status: DC | PRN

## 2013-11-08 MED ORDER — OMEPRAZOLE 20 MG PO CPDR: 20.0000 mg | DELAYED_RELEASE_CAPSULE | Freq: Every day | ORAL | Status: DC

## 2013-11-08 MED ORDER — FENTANYL CITRATE 0.05 MG/ML IJ SOLN
INTRAMUSCULAR | Status: DC | PRN
  Administered 2013-11-08 (×2): 25 ug via INTRAVENOUS

## 2013-11-08 MED ORDER — MIDAZOLAM HCL 10 MG/2ML IJ SOLN
INTRAMUSCULAR | Status: DC | PRN
  Administered 2013-11-08: 1 mg via INTRAVENOUS
  Administered 2013-11-08 (×3): 2 mg via INTRAVENOUS

## 2013-11-08 MED ORDER — BUTAMBEN-TETRACAINE-BENZOCAINE 2-2-14 % EX AERO
INHALATION_SPRAY | CUTANEOUS | Status: DC | PRN
  Administered 2013-11-08: 2 via TOPICAL

## 2013-11-08 MED ORDER — FENTANYL CITRATE 0.05 MG/ML IJ SOLN
INTRAMUSCULAR | Status: AC
  Filled 2013-11-08: qty 2

## 2013-11-08 MED ORDER — FLUTICASONE PROPIONATE 50 MCG/ACT NA SUSP: 2.0000 | Freq: Every day | NASAL | Status: DC | PRN

## 2013-11-08 MED ORDER — MIDAZOLAM HCL 5 MG/ML IJ SOLN
INTRAMUSCULAR | Status: AC
  Filled 2013-11-08: qty 2

## 2013-11-08 NOTE — Discharge Summary (Signed)
Physician Discharge Summary  Patient ID: [name] MRN: [id number] DOB/AGE: [birth year] 80 y.o.  Admit date: 11/05/2013 Discharge date: 11/09/2013  Primary Care Physician:  Elyn Peers, MD  Discharge Diagnoses:    . Syncope likely vasovagal episode  . 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 Hypertension . Breast cancer . Renal insufficiency  Consults:  Gastroenterology, Dr. Fuller Plan, Dr. Benson Norway    Allergies:  No Known Allergies   Discharge Medications:   Medication List         allopurinol 100 MG tablet  Commonly known as:  ZYLOPRIM  Take 100 mg by mouth daily.     anastrozole 1 MG tablet  Commonly known as:  ARIMIDEX  Take 1 tablet (1 mg total) by mouth daily.     calcium carbonate 600 MG Tabs tablet  Commonly known as:  OS-CAL  Take 600 mg by mouth daily.     diclofenac sodium 1 % Gel  Commonly known as:  VOLTAREN  Apply 2 g topically 3 (three) times daily as needed. For arthritis pain     EXFORGE HCT 5-160-12.5 MG Tabs  Generic drug:  Amlodipine-Valsartan-HCTZ  Take 1 tablet by mouth daily.     Fish Oil 500 MG Caps  Take 1 capsule by mouth daily.     fluticasone 50 MCG/ACT nasal spray  Commonly known as:  FLONASE  Place 2 sprays into both nostrils daily as needed for allergies or rhinitis.     folic acid 341 MCG tablet  Commonly known as:  FOLVITE  Take 400-800 mcg by mouth daily.     INTEGRA PLUS Caps  Take 1 capsule by mouth daily.     loratadine 10 MG tablet  Commonly known as:  CLARITIN  Take 1 tablet (10 mg total) by mouth daily as needed for allergies or rhinitis.     omeprazole 20 MG capsule  Commonly known as:  PRILOSEC  Take 1 capsule (20 mg total) by mouth daily.     RADIAPLEX EX  Apply topically.     vitamin C 500 MG tablet  Commonly known as:  ASCORBIC ACID  Take 500 mg by mouth daily.     Vitamin D-3 1000 UNITS Caps  Take 1,000 Units by mouth daily.         Brief H and P: For complete  details please refer to admission H and P, but in brief [name] is a 80 y.o. female who presents to the ED after suffering a syncopal episode while she was using the toilet. She denied having any nausea or vomiting or ABD pain, and she also denied having fevers or chills. She reported that she occasionally has syncope while defecating. She was evaluated in the ED and was found to have a hemoglobin level of 9.4, and on 10/23/2013 her hemoglobin had been 12. She reported having dark stools due to her Iron therapy and had not noticed any red or burgundy or bloody stools. She also denied having hematemesis.    Hospital Course:     Syncope: - Likely vasovagal after defecating, orthostatic vital signs negative - 2-D echo showed EF of 60-65%, focal basal hypertrophy, no regional wall motion abnormalities   Anemia with right lower quadrant abdominal pain   CT abdomen pelvis showed no acute intra-abdominal abnormality. Patient had EGD in 8/14 which had shown normal esophagus and duodenum, moderate chronic gastritis. colonoscopy in 8/14, which was essentially normal as well. She reported that she does not take any NSAIDs.  FOBT was positive hence GI was consulted. Patient underwent enteroscopy 11/08/13 which showed multiple gastric AVMs, duodenal and jejunal AVM's. She was recommended to be transfused as needed.  Followup with Dr. Collene Mares in 2 weeks Hemoglobin remained stable at 9.0 on 11/09/13 at discharge.    Hypertension: Currently stable   Breast cancer continue tamoxifen treatment   Renal insufficiency- currently improved, stable    Day of Discharge BP 124/47  Pulse 67  Temp(Src) 99.9 F (37.7 C) (Oral)  Resp 18  Ht 5\' 2"  (1.575 m)  Wt 61.456 kg (135 lb 7.8 oz)  BMI 24.77 kg/m2  SpO2 96%  Physical Exam: General: Alert and awake oriented x3 not in any acute distress. HEENT: anicteric sclera, pupils reactive to light and accommodation CVS: S1-S2 clear no murmur rubs or gallops Chest:  clear to auscultation bilaterally, no wheezing rales or rhonchi Abdomen: soft nontender, nondistended, normal bowel sounds Extremities: no cyanosis, clubbing or edema noted bilaterally Neuro: Cranial nerves II-XII intact, no focal neurological deficits   The results of significant diagnostics from this hospitalization (including imaging, microbiology, ancillary and laboratory) are listed below for reference.    LAB RESULTS: Basic Metabolic Panel:  Recent Labs Lab 11/07/13 0515 11/09/13 0615  NA 141 142  K 4.4 4.1  CL 103 106  CO2 21 24  GLUCOSE 99 115*  BUN 14 12  CREATININE 1.11* 1.23*  CALCIUM 9.6 9.1   Liver Function Tests:  Recent Labs Lab 11/05/13 1647  AST 44*  ALT 44*  ALKPHOS 88  BILITOT <0.2*  PROT 7.7  ALBUMIN 3.6   No results found for this basename: LIPASE, AMYLASE,  in the last 168 hours No results found for this basename: AMMONIA,  in the last 168 hours CBC:  Recent Labs Lab 11/05/13 1647  11/06/13 0840  11/07/13 2249 11/09/13 0615  WBC 8.8  < > 5.7  --   --  8.6  NEUTROABS 7.0  --   --   --   --   --   HGB 9.4*  < > 8.4*  < > 8.5* 9.0*  HCT 27.5*  < > 24.5*  < > 24.3* 25.4*  MCV 99.6  < > 100.0  --   --  98.1  PLT 194  < > 161  --   --  204  < > = values in this interval not displayed. Cardiac Enzymes:  Recent Labs Lab 11/06/13 1656 11/07/13 0005  TROPONINI <0.30 <0.30   BNP: No components found with this basename: POCBNP,  CBG: No results found for this basename: GLUCAP,  in the last 168 hours  Significant Diagnostic Studies:  Ct Abdomen Pelvis W Contrast  11/06/2013   CLINICAL DATA:  Right-sided abdominal pain and flank pain. Diaphoresis and near syncope. History of left breast cancer, status post radiation therapy.  EXAM: CT ABDOMEN AND PELVIS WITH CONTRAST  TECHNIQUE: Multidetector CT imaging of the abdomen and pelvis was performed using the standard protocol following bolus administration of intravenous contrast.  CONTRAST:   171mL OMNIPAQUE IOHEXOL 300 MG/ML  SOLN  COMPARISON:  CT of the abdomen and pelvis performed 05/05/2008  FINDINGS: Emphysematous change is noted at the lung bases.  The liver and spleen are unremarkable in appearance. The gallbladder is within normal limits. The pancreas and adrenal glands are unremarkable.  The kidneys are unremarkable in appearance. There is no evidence of hydronephrosis. No renal or ureteral stones are seen. No perinephric stranding is appreciated.  No free fluid is identified. The  small bowel is unremarkable in appearance. The stomach is within normal limits. No acute vascular abnormalities are seen. Relatively diffuse calcification is noted along the abdominal aorta and its branches.  The patient is status post appendectomy. Contrast progresses to the level of the sigmoid colon. The colon is unremarkable in appearance.  The bladder is mildly distended and grossly unremarkable. The patient is status post hysterectomy. No suspicious adnexal masses are seen. No inguinal lymphadenopathy is seen.  No acute osseous abnormalities are identified. Disc space narrowing and vacuum phenomenon are seen at L4-L5. Facet disease is noted along the lumbar spine.  IMPRESSION: 1. No acute abnormality seen within the abdomen or pelvis. 2. Relatively diffuse calcification along the abdominal aorta and its branches. 3. Emphysematous change at the lung bases.   Electronically Signed   By: Garald Balding M.D.   On: 11/06/2013 22:03    Disposition and Follow-up: Discharge Orders   Future Appointments Provider Department Dept Phone   11/18/2013 10:00 AM [facility] [phone]   11/18/2013 10:30 AM [facility] Nurse [facility] [phone]   11/30/2013 1:30 PM Adin Hector, [facility] [place]   12/16/2013 10:00 AM [facility] [phone]   12/16/2013  10:30 AM [facility] In[facility] Nurse [facility] [phone]   01/13/2014 10:15 AM [facility] [phone]   01/13/2014 10:45 AM [facility] Nurse [facility] [phone]   02/10/2014 10:00 AM [facility] [phone]   02/10/2014 10:30 AM [facility] [phone]   03/10/2014 10:00 AM [facility] [phone]   03/10/2014 10:30 AM [facility] Nurse [facility] [phone]   04/08/2014 10:00 AM [facility] [phone]   04/08/2014 10:30 AM Deatra Robinson, MD [facility] [phone]   04/08/2014 11:00 AM [facility] [phone]   Future Orders Complete By Expires   Diet - low sodium heart healthy  As directed    Increase activity slowly  As directed        DISPOSITION: Home  DIET: Heart healthy   DISCHARGE FOLLOW-UP Follow-up Information   Follow up with Elyn Peers, MD. Schedule an appointment as soon as possible for a visit in 2 weeks.   Specialty:  Family Medicine   Contact information:   [place] [address] [phone]       Follow up with MANN,JYOTHI, MD. Schedule an appointment as soon as possible for a visit in 2 weeks. (For hospital followup)    Specialty:  Gastroenterology   Contact information:   [address] [place] [address] [phone]       Time spent on Discharge: 35 minutes  Signed:    RAI,RIPUDEEP M.D. [facility] 11/09/2013, 10:24 AM  Pager: [phone]

## 2013-11-08 NOTE — Op Note (Signed)
[facility] [place], [id number]   OPERATIVE PROCEDURE REPORT  PATIENT: [name], [name]  MR#: [id number] BIRTHDATE: Jul 13, 1932 , 71  yrs. old GENDER: Female ENDOSCOPIST: Carol Ada, MD REFERRED BY: PROCEDURE DATE: 11/08/2013 PROCEDURE:   Small bowel enteroscopy with ablation therapy ASA CLASS:   Class III INDICATIONS:1.  iron deficiency anemia. MEDICATIONS: Versed 7 mg IV and Fentanyl 50 mcg IV TOPICAL ANESTHETIC:   Cetacaine Spray  DESCRIPTION OF PROCEDURE:   After the risks benefits and alternatives of the procedure were thoroughly explained, informed consent was obtained.  The     endoscope was introduced through the mouth  and advanced to the proximal jejunum jejunum , limited by Without limitations.   The instrument was slowly withdrawn as the mucosa was fully examined.    FINDINGS: The esophagus was normal.  In the gastric mucosa multiple nonbleeding AVMs were identified.  AVMs were identified into the proximal jejunum.  All visualized AVMs were cauterized with APC. No evidence of any inflammation, ulcerations, erosions, polyps, or masses.   Retroflexed views revealed no abnormalities.    The scope was then withdrawn from the patient and the procedure terminated.  COMPLICATIONS: There were no complications. ENDOSCOPIC IMPRESSION: 1) Gastric AVMs 2) Duodenal and jejunal AVMs.  RECOMMENDATIONS:  1) Follow [facility]. 2) Transfuse if necessary.  REPEAT EXAM:  _______________________________ eSignedCarol Ada, MD 11/08/2013 1:21 PM   CC:

## 2013-11-08 NOTE — H&P (View-Only) (Signed)
Patient ID: [name]  female  WFU:[id number]    DOB: 1931/12/12    DOA: 11/05/2013  PCP: Elyn Peers, MD  Assessment/Plan: Principal Problem:   Syncope: - Likely vasovagal after defecating, orthostatic vital signs negative this morning - 2-D echo showed EF of 60-65%, focal basal hypertrophy, no regional wall motion abnormalities  Active Problems:   Anemia with right lower quadrant abdominal pain - CT abdomen pelvis showed no acute intra-abdominal abnormality - patient had EGD in 8/14 which had shown normal esophagus and duodenum, moderate chronic gastritis. colonoscopy in 8/14, which was essentially normal as well - Patient reports that she does not take any NSAIDs - FOBT is positive, currently patient is on PPI, GI consulted, recommended EGD/enteroscopy Monday    Hypertension: Currently stable    Breast cancer continue tamoxifen treatment    Renal insufficiency- currently improved, stable  DVT Prophylaxis: SCDs  Code Status:  Family Communication:  Disposition: Pending GI workup    Subjective: Denies any specific complaints today, no abdominal pain, chest pain, shortness of breath  Objective: Weight change:   Intake/Output Summary (Last 24 hours) at 11/07/13 1227 Last data filed at 11/07/13 0900  Gross per 24 hour  Intake    360 ml  Output      0 ml  Net    360 ml   Blood pressure 113/64, pulse 66, temperature 98.4 F (36.9 C), temperature source Oral, resp. rate 16, height 5\' 2"  (1.575 m), weight 61.871 kg (136 lb 6.4 oz), SpO2 98.00%.  Physical Exam: General: Alert and awake, oriented x3, not in any acute distress. CVS: S1-S2 clear, no murmur rubs or gallops Chest: clear to auscultation bilaterally, no wheezing, rales or rhonchi Abdomen: NT, soft nondistended, normal bowel sounds  Extremities: no cyanosis, clubbing or edema noted bilaterally   Lab Results: Basic Metabolic Panel:  Recent Labs Lab 11/06/13 0840 11/07/13 0515  NA 142 141  K  4.0 4.4  CL 108 103  CO2 21 21  GLUCOSE 143* 99  BUN 28* 14  CREATININE 1.12* 1.11*  CALCIUM 9.1 9.6   Liver Function Tests:  Recent Labs Lab 11/05/13 1647  AST 44*  ALT 44*  ALKPHOS 88  BILITOT <0.2*  PROT 7.7  ALBUMIN 3.6   No results found for this basename: LIPASE, AMYLASE,  in the last 168 hours No results found for this basename: AMMONIA,  in the last 168 hours CBC:  Recent Labs Lab 11/05/13 1647 11/06/13 0515 11/06/13 0840  11/06/13 2119 11/07/13 0515  WBC 8.8 6.1 5.7  --   --   --   NEUTROABS 7.0  --   --   --   --   --   HGB 9.4* 8.2* 8.4*  < > 8.7* 9.4*  HCT 27.5* 24.0* 24.5*  < > 24.7* 27.1*  MCV 99.6 99.6 100.0  --   --   --   PLT 194 163 161  --   --   --   < > = values in this interval not displayed. Cardiac Enzymes:  Recent Labs Lab 11/06/13 1320 11/06/13 1656 11/07/13 0005  TROPONINI <0.30 <0.30 <0.30   BNP: No components found with this basename: POCBNP,  CBG: No results found for this basename: GLUCAP,  in the last 168 hours   Micro Results: No results found for this or any previous visit (from the past 240 hour(s)).  Studies/Results: No results found.  Medications: Scheduled Meds: . allopurinol  100 mg Oral Daily  . anastrozole  1 mg Oral Daily  . calcium carbonate  1 tablet Oral Daily  . cholecalciferol  1,000 Units Oral Daily  . omega-3 acid ethyl esters  1 g Oral Daily  . pantoprazole (PROTONIX) IV  40 mg Intravenous Q12H  . sodium chloride  3 mL Intravenous Q12H      LOS: 2 days   Izzie Geers M.D. [facility] 11/07/2013, 12:27 PM Pager: [phone]  If 7PM-7AM, please contact night-coverage [email or web address] Password [email or web address]

## 2013-11-08 NOTE — Interval H&P Note (Signed)
History and Physical Interval Note:  11/08/2013 12:36 PM  [name]  has presented today for surgery, with the diagnosis of heme + stool  The various methods of treatment have been discussed with the patient and family. After consideration of risks, benefits and other options for treatment, the patient has consented to  Procedure(s): ENTEROSCOPY (N/A) as a surgical intervention .  The patient's history has been reviewed, patient examined, no change in status, stable for surgery.  I have reviewed the patient's chart and labs.  Questions were answered to the patient's satisfaction.     Hubert Raatz D

## 2013-11-08 NOTE — Progress Notes (Signed)
Patient ID: [name]  female  EAV:[id number]    DOB: Apr 30, 1932    DOA: 11/05/2013  PCP: Elyn Peers, MD  Assessment/Plan: Principal Problem:   Syncope: - Likely vasovagal after defecating, orthostatic vital signs negative  - 2-D echo showed EF of 60-65%, focal basal hypertrophy, no regional wall motion abnormalities  Active Problems:   Anemia with right lower quadrant abdominal pain: CT abdomen pelvis showed no acute intra-abdominal abnormality Patient had EGD in 8/14 which had shown normal esophagus and duodenum, moderate chronic gastritis. colonoscopy in 8/14, which was essentially normal as well. She reported that she does not take any NSAIDs - FOBT was positive hence GI was consulted. Patient underwent enteroscopy 11/08/13 which showed multiple gastric AVMs, duodenal in generally BM's. She was recommended to be transfused history. Followup with Dr. Collene Mares in 2 weeks    Hypertension: Currently stable    Breast cancer continue tamoxifen treatment    Renal insufficiency- currently improved, stable  DVT Prophylaxis: SCDs  Code Status:  Family Communication:  Disposition: ? Today if patient is alert and awake, currently groggy after the endoscopy    Subjective: Patient seen earlier, awaiting endoscopy today, denies any specific complaints  Objective: Weight change:   Intake/Output Summary (Last 24 hours) at 11/08/13 1508 Last data filed at 11/08/13 0600  Gross per 24 hour  Intake    600 ml  Output      0 ml  Net    600 ml   Blood pressure 110/66, pulse 68, temperature 97.8 F (36.6 C), temperature source Oral, resp. rate 16, height 5\' 2"  (1.575 m), weight 61.871 kg (136 lb 6.4 oz), SpO2 98.00%.  Physical Exam: General: Alert and awake, oriented x3, not in any acute distress. CVS: S1-S2 clear, no murmur rubs or gallops Chest: clear to auscultation bilaterally, no wheezing, rales or rhonchi Abdomen: NT, soft nondistended, normal bowel sounds  Extremities: no  cyanosis, clubbing or edema noted bilaterally   Lab Results: Basic Metabolic Panel:  Recent Labs Lab 11/06/13 0840 11/07/13 0515  NA 142 141  K 4.0 4.4  CL 108 103  CO2 21 21  GLUCOSE 143* 99  BUN 28* 14  CREATININE 1.12* 1.11*  CALCIUM 9.1 9.6   Liver Function Tests:  Recent Labs Lab 11/05/13 1647  AST 44*  ALT 44*  ALKPHOS 88  BILITOT <0.2*  PROT 7.7  ALBUMIN 3.6   No results found for this basename: LIPASE, AMYLASE,  in the last 168 hours No results found for this basename: AMMONIA,  in the last 168 hours CBC:  Recent Labs Lab 11/05/13 1647 11/06/13 0515 11/06/13 0840  11/07/13 1315 11/07/13 2249  WBC 8.8 6.1 5.7  --   --   --   NEUTROABS 7.0  --   --   --   --   --   HGB 9.4* 8.2* 8.4*  < > 8.9* 8.5*  HCT 27.5* 24.0* 24.5*  < > 26.1* 24.3*  MCV 99.6 99.6 100.0  --   --   --   PLT 194 163 161  --   --   --   < > = values in this interval not displayed. Cardiac Enzymes:  Recent Labs Lab 11/06/13 1320 11/06/13 1656 11/07/13 0005  TROPONINI <0.30 <0.30 <0.30   BNP: No components found with this basename: POCBNP,  CBG: No results found for this basename: GLUCAP,  in the last 168 hours   Micro Results: No results found for this or any previous visit (from  the past 240 hour(s)).  Studies/Results: No results found.  Medications: Scheduled Meds: . allopurinol  100 mg Oral Daily  . anastrozole  1 mg Oral Daily  . calcium carbonate  1 tablet Oral Daily  . cholecalciferol  1,000 Units Oral Daily  . fluticasone  2 spray Each Nare Daily  . loratadine  10 mg Oral Daily  . omega-3 acid ethyl esters  1 g Oral Daily  . pantoprazole (PROTONIX) IV  40 mg Intravenous Q12H  . sodium chloride  3 mL Intravenous Q12H      LOS: 3 days   Ora Bollig M.D. [facility] 11/08/2013, 3:08 PM Pager: [phone]  If 7PM-7AM, please contact night-coverage [email or web address] Password [email or web address]

## 2013-11-09 ENCOUNTER — Encounter (HOSPITAL_COMMUNITY): Payer: Self-pay | Admitting: Gastroenterology

## 2013-11-09 DIAGNOSIS — F411 Generalized anxiety disorder: Secondary | ICD-10-CM

## 2013-11-09 DIAGNOSIS — R195 Other fecal abnormalities: Secondary | ICD-10-CM

## 2013-11-09 LAB — BASIC METABOLIC PANEL
BUN: 12 mg/dL (ref 6–23)
CALCIUM: 9.1 mg/dL (ref 8.4–10.5)
CO2: 24 mEq/L (ref 19–32)
CREATININE: 1.23 mg/dL — AB (ref 0.50–1.10)
Chloride: 106 mEq/L (ref 96–112)
GFR calc Af Amer: 47 mL/min — ABNORMAL LOW (ref >90)
GFR calc non Af Amer: 41 mL/min — ABNORMAL LOW (ref >90)
GLUCOSE: 115 mg/dL — AB (ref 70–99)
Potassium: 4.1 mEq/L (ref 3.7–5.3)
SODIUM: 142 meq/L (ref 137–147)

## 2013-11-09 LAB — CBC
HCT: 25.4 % — ABNORMAL LOW (ref 36.0–46.0)
HEMOGLOBIN: 9 g/dL — AB (ref 12.0–15.0)
MCH: 34.7 pg — AB (ref 26.0–34.0)
MCHC: 35.4 g/dL (ref 30.0–36.0)
MCV: 98.1 fL (ref 78.0–100.0)
Platelets: 204 10*3/uL (ref 150–400)
RBC: 2.59 MIL/uL — AB (ref 3.87–5.11)
RDW: 13.8 % (ref 11.5–15.5)
WBC: 8.6 10*3/uL (ref 4.0–10.5)

## 2013-11-09 NOTE — Progress Notes (Signed)
Pt is being discharged home. Pt has been provided with discharge instructions. RN went over discharge instructions with patient and answered all questions the patient had.

## 2013-11-17 ENCOUNTER — Other Ambulatory Visit: Payer: Self-pay | Admitting: Emergency Medicine

## 2013-11-17 DIAGNOSIS — C50919 Malignant neoplasm of unspecified site of unspecified female breast: Secondary | ICD-10-CM

## 2013-11-18 ENCOUNTER — Other Ambulatory Visit (HOSPITAL_BASED_OUTPATIENT_CLINIC_OR_DEPARTMENT_OTHER): Payer: PRIVATE HEALTH INSURANCE

## 2013-11-18 ENCOUNTER — Ambulatory Visit (HOSPITAL_BASED_OUTPATIENT_CLINIC_OR_DEPARTMENT_OTHER): Payer: PRIVATE HEALTH INSURANCE

## 2013-11-18 VITALS — BP 137/59 | HR 81 | Temp 97.5°F

## 2013-11-18 DIAGNOSIS — N039 Chronic nephritic syndrome with unspecified morphologic changes: Secondary | ICD-10-CM

## 2013-11-18 DIAGNOSIS — N189 Chronic kidney disease, unspecified: Secondary | ICD-10-CM

## 2013-11-18 DIAGNOSIS — D631 Anemia in chronic kidney disease: Secondary | ICD-10-CM

## 2013-11-18 DIAGNOSIS — D638 Anemia in other chronic diseases classified elsewhere: Secondary | ICD-10-CM

## 2013-11-18 DIAGNOSIS — C50919 Malignant neoplasm of unspecified site of unspecified female breast: Secondary | ICD-10-CM

## 2013-11-18 LAB — CBC WITH DIFFERENTIAL/PLATELET
BASO%: 0.7 % (ref 0.0–2.0)
Basophils Absolute: 0 10*3/uL (ref 0.0–0.1)
EOS%: 2.6 % (ref 0.0–7.0)
Eosinophils Absolute: 0.2 10*3/uL (ref 0.0–0.5)
HCT: 27.5 % — ABNORMAL LOW (ref 34.8–46.6)
HGB: 9.5 g/dL — ABNORMAL LOW (ref 11.6–15.9)
LYMPH%: 14.9 % (ref 14.0–49.7)
MCH: 35.3 pg — ABNORMAL HIGH (ref 25.1–34.0)
MCHC: 34.3 g/dL (ref 31.5–36.0)
MCV: 102.7 fL — ABNORMAL HIGH (ref 79.5–101.0)
MONO#: 0.6 10*3/uL (ref 0.1–0.9)
MONO%: 8.9 % (ref 0.0–14.0)
NEUT#: 4.5 10*3/uL (ref 1.5–6.5)
NEUT%: 72.9 % (ref 38.4–76.8)
PLATELETS: 272 10*3/uL (ref 145–400)
RBC: 2.68 10*6/uL — AB (ref 3.70–5.45)
RDW: 14.7 % — ABNORMAL HIGH (ref 11.2–14.5)
WBC: 6.2 10*3/uL (ref 3.9–10.3)
lymph#: 0.9 10*3/uL (ref 0.9–3.3)

## 2013-11-18 MED ORDER — DARBEPOETIN ALFA-POLYSORBATE 300 MCG/0.6ML IJ SOLN
300.0000 ug | Freq: Once | INTRAMUSCULAR | Status: AC
  Administered 2013-11-18: 300 ug via SUBCUTANEOUS
  Filled 2013-11-18: qty 0.6

## 2013-11-30 ENCOUNTER — Encounter (INDEPENDENT_AMBULATORY_CARE_PROVIDER_SITE_OTHER): Payer: Self-pay | Admitting: General Surgery

## 2013-11-30 ENCOUNTER — Other Ambulatory Visit (INDEPENDENT_AMBULATORY_CARE_PROVIDER_SITE_OTHER): Payer: Self-pay

## 2013-11-30 ENCOUNTER — Ambulatory Visit (INDEPENDENT_AMBULATORY_CARE_PROVIDER_SITE_OTHER): Payer: PRIVATE HEALTH INSURANCE | Admitting: General Surgery

## 2013-11-30 VITALS — BP 102/68 | HR 71 | Temp 97.7°F | Resp 16 | Ht 62.0 in | Wt 132.2 lb

## 2013-11-30 DIAGNOSIS — C50919 Malignant neoplasm of unspecified site of unspecified female breast: Secondary | ICD-10-CM

## 2013-11-30 DIAGNOSIS — Z853 Personal history of malignant neoplasm of breast: Secondary | ICD-10-CM

## 2013-11-30 NOTE — Progress Notes (Signed)
Patient ID: [name], female   DOB: December 21, 1931, 80 y.o.   MRN: [id number] History:  This patient underwent left partial mastectomy and sentinel lobe biopsy on 10/08/2012. She had a 2.1 cm invasive ductal carcinoma, all 3 sentinel nodes were negative. I had to re excise the posterior margin. Final pathology report shows negative margins. Pathologic stage T2, N0, ER 100%, PR 13%, HER-2-negative, Ki 67 14%.  She is feeling fine and has no complaints about her breast She had adjuvant radiation therapy was Dr. Sondra Come and tolerated that well. She is on her index and is followed by Dr. Marcy Panning. She has not had mammograms since her sugar-free. She was a little bit upset because of the $50 a pack a day she says that she will go ahead and have this done now, however. She has no complaints about her breast a little bit of tenderness in the left breast.   Past history, family history, social history, and review of systems are documented on chart unchanged and  noncontributory except as described above. She is undergoing a GI workup by Dr.  Vennie Homans for obscure GI bleeding.  Exam:  Patient is well. No complaints  Neck reveals no adenopathy or mass Lungs are clear auscultation bilaterally Heart regular rate and rhythm. No murmur. No ectopy. Left breast reveals transverse incision above the areola. Slight volume loss because of the extent of resection. No hematoma. No seroma. No skin necrosis. Axillary incision looks fine. No arm swelling or sensory deficit    Assessment:  Invasive carcinoma left breast, pathologic stage T2, N0, receptor positive, HER-2-negative, Ki 67 14%.  No evidence of cancer 14 months following left partial mastectomy, reexcision posterior margin, and sentinel lymph node biopsy  GI bleeding. Evaluation underway by gastroenterology   Plan: Proceed with bilateral mammograms now Annual mammography thereafter Continue arimidex and follow up with Dr. Marcy Panning   to see me  in one year     Weber Monnier M. Dalbert Batman, M.D., [facility] General and Minimally invasive Surgery Breast and Colorectal Surgery Office:   [phone] Pager:   [phone]

## 2013-11-30 NOTE — Patient Instructions (Signed)
Examination of your breasts and all the lymph node areas is normal. There is no evidence of cancer by physical exam.  You'll be scheduled for bilateral mammograms now.  You should get bilateral mammograms annually  Return to see Dr. Dalbert Batman in one year.

## 2013-12-01 ENCOUNTER — Telehealth (INDEPENDENT_AMBULATORY_CARE_PROVIDER_SITE_OTHER): Payer: Self-pay | Admitting: *Deleted

## 2013-12-01 NOTE — Telephone Encounter (Signed)
I spoke with pt and informed her of the appt for her MM at [facility] on 12/13/13 with an arrival time of 10:00am.  I made her aware of [facility] location.  She is agreeable with this appt.

## 2013-12-13 ENCOUNTER — Ambulatory Visit
Admission: RE | Admit: 2013-12-13 | Discharge: 2013-12-13 | Disposition: A | Discharge status: Home / Self Care | Payer: PRIVATE HEALTH INSURANCE | Source: Ambulatory Visit | Attending: General Surgery | Admitting: General Surgery

## 2013-12-13 ENCOUNTER — Other Ambulatory Visit (INDEPENDENT_AMBULATORY_CARE_PROVIDER_SITE_OTHER): Payer: Self-pay | Admitting: General Surgery

## 2013-12-13 DIAGNOSIS — Z853 Personal history of malignant neoplasm of breast: Secondary | ICD-10-CM

## 2013-12-15 ENCOUNTER — Telehealth: Payer: Self-pay | Admitting: Oncology

## 2013-12-15 ENCOUNTER — Other Ambulatory Visit: Payer: Self-pay

## 2013-12-15 ENCOUNTER — Telehealth: Payer: Self-pay

## 2013-12-15 DIAGNOSIS — C50919 Malignant neoplasm of unspecified site of unspecified female breast: Secondary | ICD-10-CM

## 2013-12-15 NOTE — Telephone Encounter (Signed)
Pt called wanting to reschedule tomorrows appointments, is afraid she will not be able to make it in due to weather and transportation.  POF sent.

## 2013-12-15 NOTE — Telephone Encounter (Signed)
, °

## 2013-12-16 ENCOUNTER — Other Ambulatory Visit: Payer: PRIVATE HEALTH INSURANCE

## 2013-12-16 ENCOUNTER — Ambulatory Visit: Payer: PRIVATE HEALTH INSURANCE

## 2013-12-23 ENCOUNTER — Ambulatory Visit: Payer: PRIVATE HEALTH INSURANCE

## 2013-12-23 ENCOUNTER — Other Ambulatory Visit (HOSPITAL_BASED_OUTPATIENT_CLINIC_OR_DEPARTMENT_OTHER): Payer: PRIVATE HEALTH INSURANCE

## 2013-12-23 DIAGNOSIS — C50219 Malignant neoplasm of upper-inner quadrant of unspecified female breast: Secondary | ICD-10-CM

## 2013-12-23 DIAGNOSIS — C50919 Malignant neoplasm of unspecified site of unspecified female breast: Secondary | ICD-10-CM

## 2013-12-23 DIAGNOSIS — D638 Anemia in other chronic diseases classified elsewhere: Secondary | ICD-10-CM

## 2013-12-23 LAB — COMPREHENSIVE METABOLIC PANEL (CC13)
ALK PHOS: 124 U/L (ref 40–150)
ALT: 42 U/L (ref 0–55)
AST: 54 U/L — ABNORMAL HIGH (ref 5–34)
Albumin: 3.7 g/dL (ref 3.5–5.0)
Anion Gap: 10 mEq/L (ref 3–11)
BILIRUBIN TOTAL: 0.33 mg/dL (ref 0.20–1.20)
BUN: 19.2 mg/dL (ref 7.0–26.0)
CO2: 26 mEq/L (ref 22–29)
CREATININE: 1.3 mg/dL — AB (ref 0.6–1.1)
Calcium: 10.4 mg/dL (ref 8.4–10.4)
Chloride: 107 mEq/L (ref 98–109)
GLUCOSE: 78 mg/dL (ref 70–140)
Potassium: 4.7 mEq/L (ref 3.5–5.1)
Sodium: 143 mEq/L (ref 136–145)
Total Protein: 8.1 g/dL (ref 6.4–8.3)

## 2013-12-23 LAB — CBC WITH DIFFERENTIAL/PLATELET
BASO%: 0.9 % (ref 0.0–2.0)
Basophils Absolute: 0 10*3/uL (ref 0.0–0.1)
EOS%: 3.1 % (ref 0.0–7.0)
Eosinophils Absolute: 0.2 10*3/uL (ref 0.0–0.5)
HEMATOCRIT: 37.9 % (ref 34.8–46.6)
HGB: 12.6 g/dL (ref 11.6–15.9)
LYMPH%: 19.3 % (ref 14.0–49.7)
MCH: 33 pg (ref 25.1–34.0)
MCHC: 33.3 g/dL (ref 31.5–36.0)
MCV: 99 fL (ref 79.5–101.0)
MONO#: 0.6 10*3/uL (ref 0.1–0.9)
MONO%: 11.8 % (ref 0.0–14.0)
NEUT#: 3.4 10*3/uL (ref 1.5–6.5)
NEUT%: 64.9 % (ref 38.4–76.8)
PLATELETS: 194 10*3/uL (ref 145–400)
RBC: 3.82 10*6/uL (ref 3.70–5.45)
RDW: 13.5 % (ref 11.2–14.5)
WBC: 5.3 10*3/uL (ref 3.9–10.3)
lymph#: 1 10*3/uL (ref 0.9–3.3)

## 2013-12-23 MED ORDER — DARBEPOETIN ALFA-POLYSORBATE 500 MCG/ML IJ SOLN: 300.0000 ug | Freq: Once | INTRAMUSCULAR | Status: DC

## 2014-01-13 ENCOUNTER — Ambulatory Visit: Payer: PRIVATE HEALTH INSURANCE

## 2014-01-13 ENCOUNTER — Other Ambulatory Visit: Payer: PRIVATE HEALTH INSURANCE

## 2014-01-19 ENCOUNTER — Other Ambulatory Visit: Payer: Self-pay

## 2014-01-19 DIAGNOSIS — D638 Anemia in other chronic diseases classified elsewhere: Secondary | ICD-10-CM

## 2014-01-20 ENCOUNTER — Ambulatory Visit: Payer: PRIVATE HEALTH INSURANCE

## 2014-01-20 ENCOUNTER — Other Ambulatory Visit (HOSPITAL_BASED_OUTPATIENT_CLINIC_OR_DEPARTMENT_OTHER): Payer: PRIVATE HEALTH INSURANCE

## 2014-01-20 DIAGNOSIS — D631 Anemia in chronic kidney disease: Secondary | ICD-10-CM

## 2014-01-20 DIAGNOSIS — D638 Anemia in other chronic diseases classified elsewhere: Secondary | ICD-10-CM

## 2014-01-20 DIAGNOSIS — C50219 Malignant neoplasm of upper-inner quadrant of unspecified female breast: Secondary | ICD-10-CM

## 2014-01-20 DIAGNOSIS — N039 Chronic nephritic syndrome with unspecified morphologic changes: Secondary | ICD-10-CM

## 2014-01-20 DIAGNOSIS — N189 Chronic kidney disease, unspecified: Secondary | ICD-10-CM

## 2014-01-20 LAB — COMPREHENSIVE METABOLIC PANEL (CC13)
ALT: 50 U/L (ref 0–55)
ANION GAP: 11 meq/L (ref 3–11)
AST: 62 U/L — ABNORMAL HIGH (ref 5–34)
Albumin: 3.8 g/dL (ref 3.5–5.0)
Alkaline Phosphatase: 122 U/L (ref 40–150)
BUN: 21.5 mg/dL (ref 7.0–26.0)
CALCIUM: 10.2 mg/dL (ref 8.4–10.4)
CHLORIDE: 106 meq/L (ref 98–109)
CO2: 24 meq/L (ref 22–29)
CREATININE: 1.3 mg/dL — AB (ref 0.6–1.1)
GLUCOSE: 79 mg/dL (ref 70–140)
Potassium: 5.5 mEq/L — ABNORMAL HIGH (ref 3.5–5.1)
Sodium: 141 mEq/L (ref 136–145)
Total Bilirubin: 0.29 mg/dL (ref 0.20–1.20)
Total Protein: 8.2 g/dL (ref 6.4–8.3)

## 2014-01-20 LAB — CBC WITH DIFFERENTIAL/PLATELET
BASO%: 0.9 % (ref 0.0–2.0)
BASOS ABS: 0 10*3/uL (ref 0.0–0.1)
EOS ABS: 0.1 10*3/uL (ref 0.0–0.5)
EOS%: 1.4 % (ref 0.0–7.0)
HEMATOCRIT: 34.7 % — AB (ref 34.8–46.6)
HEMOGLOBIN: 11.7 g/dL (ref 11.6–15.9)
LYMPH#: 1 10*3/uL (ref 0.9–3.3)
LYMPH%: 19.3 % (ref 14.0–49.7)
MCH: 32.4 pg (ref 25.1–34.0)
MCHC: 33.7 g/dL (ref 31.5–36.0)
MCV: 96.1 fL (ref 79.5–101.0)
MONO#: 0.6 10*3/uL (ref 0.1–0.9)
MONO%: 12.3 % (ref 0.0–14.0)
NEUT#: 3.4 10*3/uL (ref 1.5–6.5)
NEUT%: 66.1 % (ref 38.4–76.8)
Platelets: 210 10*3/uL (ref 145–400)
RBC: 3.61 10*6/uL — ABNORMAL LOW (ref 3.70–5.45)
RDW: 13.1 % (ref 11.2–14.5)
WBC: 5.1 10*3/uL (ref 3.9–10.3)

## 2014-01-20 LAB — IRON AND TIBC CHCC
%SAT: 26 % (ref 21–57)
IRON: 87 ug/dL (ref 41–142)
TIBC: 339 ug/dL (ref 236–444)
UIBC: 251 ug/dL (ref 120–384)

## 2014-01-20 LAB — FERRITIN CHCC: Ferritin: 198 ng/ml (ref 9–269)

## 2014-01-20 MED ORDER — DARBEPOETIN ALFA-POLYSORBATE 500 MCG/ML IJ SOLN: 300.0000 ug | Freq: Once | INTRAMUSCULAR | Status: DC

## 2014-01-21 ENCOUNTER — Telehealth: Payer: Self-pay

## 2014-01-21 ENCOUNTER — Telehealth: Payer: Self-pay | Admitting: Oncology

## 2014-01-21 DIAGNOSIS — C50919 Malignant neoplasm of unspecified site of unspecified female breast: Secondary | ICD-10-CM

## 2014-01-21 MED ORDER — LACTULOSE 10 GM/15ML PO SOLN: 10.0000 g | ORAL | Status: DC

## 2014-01-21 NOTE — Telephone Encounter (Signed)
S/w the pt and she is aware of her lab appt on Monday 01/24/2014@10 :30am

## 2014-01-21 NOTE — Telephone Encounter (Signed)
Per note below, I let the patient know her potassium was high.  Pt denies any chest pain or heart palpitations.  Pt denies taking any potassium supplement.  Let her know that potassium affects her heart and if she has any chest pain or palpitations she should go immediately to ER.  Let her know sending prescription for Lactulose.  [facility] [address].  Pt voiced understanding.    Sent POF to scheduling for lab recheck.  Lactulose order placed.

## 2014-01-21 NOTE — Telephone Encounter (Signed)
Message copied by Prentiss Bells on Fri Jan 21, 2014 12:09 PM ------      Message from: Minette Headland      Created: Thu Jan 20, 2014 11:29 PM       Please call patient and inform her that her potassium is elevated.  Ask her if she is having any chest pain or palpitations.  Ask her if she is taking a potassium supplement.  If not, I want to call her in Lactulose.  Please request information regarding her pharmacy.  I will also request repeat lab on Monday.            Thanks,       LC       ----- Message -----         From: [facility]         Sent: 01/20/2014  10:38 AM           To: Deatra Robinson, MD                   ------

## 2014-01-24 ENCOUNTER — Telehealth: Payer: Self-pay | Admitting: *Deleted

## 2014-01-24 ENCOUNTER — Other Ambulatory Visit (HOSPITAL_BASED_OUTPATIENT_CLINIC_OR_DEPARTMENT_OTHER): Payer: PRIVATE HEALTH INSURANCE

## 2014-01-24 DIAGNOSIS — C50919 Malignant neoplasm of unspecified site of unspecified female breast: Secondary | ICD-10-CM

## 2014-01-24 DIAGNOSIS — C50219 Malignant neoplasm of upper-inner quadrant of unspecified female breast: Secondary | ICD-10-CM

## 2014-01-24 LAB — COMPREHENSIVE METABOLIC PANEL (CC13)
ALK PHOS: 113 U/L (ref 40–150)
ALT: 42 U/L (ref 0–55)
AST: 52 U/L — ABNORMAL HIGH (ref 5–34)
Albumin: 3.6 g/dL (ref 3.5–5.0)
Anion Gap: 11 mEq/L (ref 3–11)
BUN: 20.6 mg/dL (ref 7.0–26.0)
CALCIUM: 10 mg/dL (ref 8.4–10.4)
CO2: 24 mEq/L (ref 22–29)
Chloride: 108 mEq/L (ref 98–109)
Creatinine: 1.3 mg/dL — ABNORMAL HIGH (ref 0.6–1.1)
GLUCOSE: 83 mg/dL (ref 70–140)
POTASSIUM: 4.7 meq/L (ref 3.5–5.1)
Sodium: 143 mEq/L (ref 136–145)
Total Bilirubin: 0.24 mg/dL (ref 0.20–1.20)
Total Protein: 7.8 g/dL (ref 6.4–8.3)

## 2014-01-24 NOTE — Telephone Encounter (Signed)
Message copied by Harmon Pier on Mon Jan 24, 2014  5:36 PM ------      Message from: Minette Headland      Created: Mon Jan 24, 2014  4:04 PM       Patient's potassium is much improved, please tell her not to take any more lactulose.            Thanks, lc       ----- Message -----         From: [facility]         Sent: 01/24/2014  11:23 AM           To: Minette Headland, NP                   ------

## 2014-01-24 NOTE — Telephone Encounter (Signed)
Per Charlestine Massed, NP. Called pt to inform her of K+Level (4.7) and to discontinue the Lactulose. Pt verbalized understanding. No further concerns. Message to be forwarded to Charlestine Massed, NP.

## 2014-02-10 ENCOUNTER — Ambulatory Visit: Payer: PRIVATE HEALTH INSURANCE

## 2014-02-10 ENCOUNTER — Other Ambulatory Visit: Payer: PRIVATE HEALTH INSURANCE

## 2014-02-16 ENCOUNTER — Other Ambulatory Visit: Payer: Self-pay | Admitting: *Deleted

## 2014-02-16 DIAGNOSIS — C50919 Malignant neoplasm of unspecified site of unspecified female breast: Secondary | ICD-10-CM

## 2014-02-17 ENCOUNTER — Other Ambulatory Visit (HOSPITAL_BASED_OUTPATIENT_CLINIC_OR_DEPARTMENT_OTHER): Payer: PRIVATE HEALTH INSURANCE

## 2014-02-17 ENCOUNTER — Ambulatory Visit: Payer: PRIVATE HEALTH INSURANCE

## 2014-02-17 DIAGNOSIS — C50919 Malignant neoplasm of unspecified site of unspecified female breast: Secondary | ICD-10-CM

## 2014-02-17 DIAGNOSIS — D638 Anemia in other chronic diseases classified elsewhere: Secondary | ICD-10-CM

## 2014-02-17 LAB — CBC WITH DIFFERENTIAL/PLATELET
BASO%: 0.6 % (ref 0.0–2.0)
Basophils Absolute: 0 10*3/uL (ref 0.0–0.1)
EOS ABS: 0.2 10*3/uL (ref 0.0–0.5)
EOS%: 2.6 % (ref 0.0–7.0)
HCT: 34.2 % — ABNORMAL LOW (ref 34.8–46.6)
HGB: 11.3 g/dL — ABNORMAL LOW (ref 11.6–15.9)
LYMPH#: 0.9 10*3/uL (ref 0.9–3.3)
LYMPH%: 14.5 % (ref 14.0–49.7)
MCH: 31.7 pg (ref 25.1–34.0)
MCHC: 33.2 g/dL (ref 31.5–36.0)
MCV: 95.4 fL (ref 79.5–101.0)
MONO#: 0.7 10*3/uL (ref 0.1–0.9)
MONO%: 12.1 % (ref 0.0–14.0)
NEUT%: 70.2 % (ref 38.4–76.8)
NEUTROS ABS: 4.2 10*3/uL (ref 1.5–6.5)
PLATELETS: 220 10*3/uL (ref 145–400)
RBC: 3.58 10*6/uL — AB (ref 3.70–5.45)
RDW: 13.1 % (ref 11.2–14.5)
WBC: 6 10*3/uL (ref 3.9–10.3)

## 2014-02-17 LAB — COMPREHENSIVE METABOLIC PANEL (CC13)
ALBUMIN: 3.5 g/dL (ref 3.5–5.0)
ALT: 36 U/L (ref 0–55)
AST: 44 U/L — ABNORMAL HIGH (ref 5–34)
Alkaline Phosphatase: 116 U/L (ref 40–150)
Anion Gap: 10 mEq/L (ref 3–11)
BUN: 24.4 mg/dL (ref 7.0–26.0)
CHLORIDE: 107 meq/L (ref 98–109)
CO2: 25 mEq/L (ref 22–29)
Calcium: 10.2 mg/dL (ref 8.4–10.4)
Creatinine: 1.5 mg/dL — ABNORMAL HIGH (ref 0.6–1.1)
GLUCOSE: 91 mg/dL (ref 70–140)
POTASSIUM: 4.5 meq/L (ref 3.5–5.1)
Sodium: 142 mEq/L (ref 136–145)
TOTAL PROTEIN: 7.8 g/dL (ref 6.4–8.3)
Total Bilirubin: 0.28 mg/dL (ref 0.20–1.20)

## 2014-02-17 MED ORDER — DARBEPOETIN ALFA-POLYSORBATE 500 MCG/ML IJ SOLN: 300.0000 ug | Freq: Once | INTRAMUSCULAR | Status: DC

## 2014-03-06 ENCOUNTER — Observation Stay (HOSPITAL_COMMUNITY)
Admission: EM | Admit: 2014-03-06 | Discharge: 2014-03-10 | Disposition: A | Discharge status: Home / Self Care | Payer: PRIVATE HEALTH INSURANCE | Attending: Internal Medicine | Admitting: Internal Medicine

## 2014-03-06 ENCOUNTER — Emergency Department (HOSPITAL_COMMUNITY): Payer: PRIVATE HEALTH INSURANCE

## 2014-03-06 ENCOUNTER — Encounter (HOSPITAL_COMMUNITY): Payer: Self-pay | Admitting: Emergency Medicine

## 2014-03-06 DIAGNOSIS — D649 Anemia, unspecified: Secondary | ICD-10-CM

## 2014-03-06 DIAGNOSIS — R5381 Other malaise: Secondary | ICD-10-CM

## 2014-03-06 DIAGNOSIS — R5383 Other fatigue: Secondary | ICD-10-CM

## 2014-03-06 DIAGNOSIS — N183 Chronic kidney disease, stage 3 unspecified: Secondary | ICD-10-CM | POA: Diagnosis not present

## 2014-03-06 DIAGNOSIS — Z87891 Personal history of nicotine dependence: Secondary | ICD-10-CM | POA: Diagnosis not present

## 2014-03-06 DIAGNOSIS — D62 Acute posthemorrhagic anemia: Secondary | ICD-10-CM | POA: Diagnosis not present

## 2014-03-06 DIAGNOSIS — I129 Hypertensive chronic kidney disease with stage 1 through stage 4 chronic kidney disease, or unspecified chronic kidney disease: Secondary | ICD-10-CM | POA: Insufficient documentation

## 2014-03-06 DIAGNOSIS — N289 Disorder of kidney and ureter, unspecified: Secondary | ICD-10-CM

## 2014-03-06 DIAGNOSIS — I10 Essential (primary) hypertension: Secondary | ICD-10-CM | POA: Diagnosis present

## 2014-03-06 DIAGNOSIS — Z923 Personal history of irradiation: Secondary | ICD-10-CM | POA: Insufficient documentation

## 2014-03-06 DIAGNOSIS — R55 Syncope and collapse: Secondary | ICD-10-CM | POA: Diagnosis present

## 2014-03-06 DIAGNOSIS — R195 Other fecal abnormalities: Secondary | ICD-10-CM

## 2014-03-06 DIAGNOSIS — E789 Disorder of lipoprotein metabolism, unspecified: Secondary | ICD-10-CM | POA: Diagnosis not present

## 2014-03-06 DIAGNOSIS — D638 Anemia in other chronic diseases classified elsewhere: Secondary | ICD-10-CM | POA: Diagnosis not present

## 2014-03-06 DIAGNOSIS — Z853 Personal history of malignant neoplasm of breast: Secondary | ICD-10-CM | POA: Insufficient documentation

## 2014-03-06 DIAGNOSIS — K759 Inflammatory liver disease, unspecified: Secondary | ICD-10-CM

## 2014-03-06 DIAGNOSIS — F411 Generalized anxiety disorder: Secondary | ICD-10-CM | POA: Insufficient documentation

## 2014-03-06 DIAGNOSIS — D500 Iron deficiency anemia secondary to blood loss (chronic): Secondary | ICD-10-CM | POA: Diagnosis not present

## 2014-03-06 DIAGNOSIS — C50919 Malignant neoplasm of unspecified site of unspecified female breast: Secondary | ICD-10-CM | POA: Diagnosis present

## 2014-03-06 DIAGNOSIS — C50912 Malignant neoplasm of unspecified site of left female breast: Secondary | ICD-10-CM | POA: Diagnosis present

## 2014-03-06 DIAGNOSIS — R0609 Other forms of dyspnea: Secondary | ICD-10-CM

## 2014-03-06 DIAGNOSIS — F419 Anxiety disorder, unspecified: Secondary | ICD-10-CM

## 2014-03-06 DIAGNOSIS — R531 Weakness: Secondary | ICD-10-CM

## 2014-03-06 LAB — URINALYSIS, ROUTINE W REFLEX MICROSCOPIC
Bilirubin Urine: NEGATIVE
Glucose, UA: NEGATIVE mg/dL
HGB URINE DIPSTICK: NEGATIVE
KETONES UR: NEGATIVE mg/dL
NITRITE: NEGATIVE
PROTEIN: NEGATIVE mg/dL
SPECIFIC GRAVITY, URINE: 1.007 (ref 1.005–1.030)
UROBILINOGEN UA: 0.2 mg/dL (ref 0.0–1.0)
pH: 7 (ref 5.0–8.0)

## 2014-03-06 LAB — BASIC METABOLIC PANEL
BUN: 26 mg/dL — AB (ref 6–23)
CALCIUM: 9.7 mg/dL (ref 8.4–10.5)
CO2: 23 mEq/L (ref 19–32)
CREATININE: 1.59 mg/dL — AB (ref 0.50–1.10)
Chloride: 102 mEq/L (ref 96–112)
GFR, EST AFRICAN AMERICAN: 35 mL/min — AB (ref >90)
GFR, EST NON AFRICAN AMERICAN: 30 mL/min — AB (ref >90)
Glucose, Bld: 130 mg/dL — ABNORMAL HIGH (ref 70–99)
POTASSIUM: 5.2 meq/L (ref 3.7–5.3)
Sodium: 139 mEq/L (ref 137–147)

## 2014-03-06 LAB — CBC
HEMATOCRIT: 26.4 % — AB (ref 36.0–46.0)
Hemoglobin: 8.8 g/dL — ABNORMAL LOW (ref 12.0–15.0)
MCH: 31.5 pg (ref 26.0–34.0)
MCHC: 33.3 g/dL (ref 30.0–36.0)
MCV: 94.6 fL (ref 78.0–100.0)
Platelets: 302 10*3/uL (ref 150–400)
RBC: 2.79 MIL/uL — ABNORMAL LOW (ref 3.87–5.11)
RDW: 14.3 % (ref 11.5–15.5)
WBC: 8.5 10*3/uL (ref 4.0–10.5)

## 2014-03-06 LAB — TROPONIN I: Troponin I: <0.3 ng/mL (ref <0.30)

## 2014-03-06 LAB — RETICULOCYTES
RBC.: 2.82 MIL/uL — ABNORMAL LOW (ref 3.87–5.11)
RETIC CT PCT: 4.6 % — AB (ref 0.4–3.1)
Retic Count, Absolute: 129.7 10*3/uL (ref 19.0–186.0)

## 2014-03-06 LAB — URINE MICROSCOPIC-ADD ON

## 2014-03-06 LAB — POC OCCULT BLOOD, ED: Fecal Occult Bld: NEGATIVE

## 2014-03-06 MED ORDER — ANASTROZOLE 1 MG PO TABS
1.0000 mg | ORAL_TABLET | Freq: Every day | ORAL | Status: DC
  Administered 2014-03-07 – 2014-03-10 (×4): 1 mg via ORAL
  Filled 2014-03-06 (×4): qty 1

## 2014-03-06 MED ORDER — DARBEPOETIN ALFA-POLYSORBATE 60 MCG/0.3ML IJ SOLN
60.0000 ug | INTRAMUSCULAR | Status: DC
  Administered 2014-03-06: 60 ug via SUBCUTANEOUS
  Filled 2014-03-06 (×2): qty 0.3

## 2014-03-06 MED ORDER — HEPARIN SODIUM (PORCINE) 5000 UNIT/ML IJ SOLN
5000.0000 [IU] | Freq: Three times a day (TID) | INTRAMUSCULAR | Status: DC
  Administered 2014-03-06 – 2014-03-07 (×2): 5000 [IU] via SUBCUTANEOUS
  Filled 2014-03-06 (×6): qty 1

## 2014-03-06 MED ORDER — ONDANSETRON HCL 4 MG PO TABS: 4.0000 mg | ORAL_TABLET | Freq: Four times a day (QID) | ORAL | Status: DC | PRN

## 2014-03-06 MED ORDER — ACETAMINOPHEN 650 MG RE SUPP: 650.0000 mg | Freq: Four times a day (QID) | RECTAL | Status: DC | PRN

## 2014-03-06 MED ORDER — FOLIC ACID 400 MCG PO TABS: 400.0000 ug | ORAL_TABLET | Freq: Every day | ORAL | Status: DC

## 2014-03-06 MED ORDER — CALCIUM CARBONATE 600 MG PO TABS
600.0000 mg | ORAL_TABLET | Freq: Every day | ORAL | Status: DC
  Filled 2014-03-06: qty 1

## 2014-03-06 MED ORDER — VITAMIN C 500 MG PO TABS
500.0000 mg | ORAL_TABLET | Freq: Every day | ORAL | Status: DC
  Administered 2014-03-07 – 2014-03-10 (×4): 500 mg via ORAL
  Filled 2014-03-06 (×4): qty 1

## 2014-03-06 MED ORDER — PANTOPRAZOLE SODIUM 40 MG PO TBEC
40.0000 mg | DELAYED_RELEASE_TABLET | Freq: Every day | ORAL | Status: DC
  Administered 2014-03-07 – 2014-03-10 (×4): 40 mg via ORAL
  Filled 2014-03-06 (×4): qty 1

## 2014-03-06 MED ORDER — FLUTICASONE PROPIONATE 50 MCG/ACT NA SUSP
2.0000 | Freq: Every day | NASAL | Status: DC | PRN
  Filled 2014-03-06: qty 16

## 2014-03-06 MED ORDER — SODIUM CHLORIDE 0.9 % IJ SOLN
3.0000 mL | Freq: Two times a day (BID) | INTRAMUSCULAR | Status: DC
Start: 2014-03-06 — End: 2014-03-10
  Administered 2014-03-09 (×2): 3 mL via INTRAVENOUS

## 2014-03-06 MED ORDER — ONDANSETRON HCL 4 MG/2ML IJ SOLN: 4.0000 mg | Freq: Four times a day (QID) | INTRAMUSCULAR | Status: DC | PRN

## 2014-03-06 MED ORDER — ALUM & MAG HYDROXIDE-SIMETH 200-200-20 MG/5ML PO SUSP: 30.0000 mL | Freq: Four times a day (QID) | ORAL | Status: DC | PRN

## 2014-03-06 MED ORDER — FOLIC ACID 0.5 MG HALF TAB
0.5000 mg | ORAL_TABLET | Freq: Every day | ORAL | Status: DC
  Administered 2014-03-07 – 2014-03-10 (×4): 0.5 mg via ORAL
  Filled 2014-03-06 (×4): qty 1

## 2014-03-06 MED ORDER — LORATADINE 10 MG PO TABS
10.0000 mg | ORAL_TABLET | Freq: Every day | ORAL | Status: DC | PRN
  Filled 2014-03-06: qty 1

## 2014-03-06 MED ORDER — MORPHINE SULFATE 2 MG/ML IJ SOLN: 1.0000 mg | INTRAMUSCULAR | Status: DC | PRN

## 2014-03-06 MED ORDER — SODIUM CHLORIDE 0.9 % IV BOLUS (SEPSIS)
500.0000 mL | Freq: Once | INTRAVENOUS | Status: AC
  Administered 2014-03-06: 500 mL via INTRAVENOUS

## 2014-03-06 MED ORDER — ACETAMINOPHEN 325 MG PO TABS
650.0000 mg | ORAL_TABLET | Freq: Four times a day (QID) | ORAL | Status: DC | PRN
  Administered 2014-03-08 – 2014-03-09 (×3): 650 mg via ORAL
  Filled 2014-03-06 (×3): qty 2

## 2014-03-06 MED ORDER — SODIUM CHLORIDE 0.9 % IV SOLN
INTRAVENOUS | Status: DC
Start: 2014-03-06 — End: 2014-03-10
  Administered 2014-03-06 – 2014-03-09 (×2): via INTRAVENOUS

## 2014-03-06 MED ORDER — SODIUM POLYSTYRENE SULFONATE 15 GM/60ML PO SUSP
15.0000 g | Freq: Once | ORAL | Status: AC
Start: 2014-03-06 — End: 2014-03-06
  Administered 2014-03-06: 15 g via ORAL
  Filled 2014-03-06: qty 60

## 2014-03-06 NOTE — H&P (Signed)
[facility] History and Physical  Chriss Redel SWF:[id number] DOB: [birth year] DOA: 03/06/2014  Referring physician: Betsey Holiday PCP: Elyn Peers, MD   Chief Complaint: Near syncope  HPI: [name] is a 80 y.o. female with past medical history of CKD stage 2-3, history of GI bleed secondary to AVMs and breast cancer. Patient came into the hospital because of near syncope. Patient said earlier today she was in the toilet bowl trying to defecate when she felt lightheaded, she did not lose her consciousness, she did not fall. She managed to go back to the bed, the bed she felt clammy and sweaty. She had the same exact episode in January of this year. Denies any chest pain, shortness of breath, any prodrome type of symptoms prior to the episode. She mentioned she had some runny nose and cough which resolved now which is likely secondary to seasonal allergies. In the ED she was found to have hemoglobin of 8.8, on 4/30 her hemoglobin was 11.3. Creatinine is 1.59, stool Hemoccult is negative. Patient used to get Aranesp shots, she didn't have it for the past 2 months because of hemoglobin >10.   Review of Systems:  Constitutional: negative for anorexia, fevers and sweats Eyes: negative for irritation, redness and visual disturbance Ears, nose, mouth, throat, and face: negative for earaches, epistaxis, nasal congestion and sore throat Respiratory: negative for cough, dyspnea on exertion, sputum and wheezing Cardiovascular: negative for chest pain, dyspnea, lower extremity edema, orthopnea, palpitations. Gastrointestinal: negative for abdominal pain, constipation, diarrhea, melena, nausea and vomiting Genitourinary:negative for dysuria, frequency and hematuria Hematologic/lymphatic: negative for bleeding, easy bruising and lymphadenopathy Musculoskeletal:negative for arthralgias, muscle weakness and stiff joints Neurological: negative for coordination problems, gait problems,  headaches and weakness Endocrine: negative for diabetic symptoms including polydipsia, polyuria and weight loss Allergic/Immunologic: negative for anaphylaxis, hay fever and urticaria  Past Medical History  Diagnosis Date  . Anemia of chronic disease   . Anxiety   . Hypertension   . Hepatitis   . Lipid disorder   . Wears glasses   . Renal insufficiency   . Wears dentures     top  . Breast cancer 08/28/12    IDC left breast bx=invasive ductal ca,ER/PR=+  . Breast cancer 09/01/2012  . Radiation 11/12/12-12/15/11    Left Breast/50 Gy   Past Surgical History  Procedure Laterality Date  . Abdominal hysterectomy       approx 20 years ago  . Appendectomy      20 years ago  . Foot surgery  1992    bone spurs both feet  . Colonoscopy  2009?  [name] Partial mastectomy with needle localization and axillary sentinel lymph node bx  10/08/2012    Procedure: PARTIAL MASTECTOMY WITH NEEDLE LOCALIZATION AND AXILLARY SENTINEL LYMPH NODE BX;  Surgeon: Adin Hector, MD;  Location: [facility];  Service: General;  Laterality: Left;  Left partial mastectomy with Needle localization and Sentinel lymph node biospy  . Enteroscopy N/A 11/08/2013    Procedure: ENTEROSCOPY;  Surgeon: Beryle Beams, MD;  Location: [place];  Service: Endoscopy;  Laterality: N/A;   Social History:   reports that she quit smoking about 6 years ago. Her smoking use included Cigarettes. She smoked 1.00 pack per day. She has never used smokeless tobacco. She reports that she drinks about .6 ounces of alcohol per week. She reports that she does not use illicit drugs.  No Known Allergies  Family History  Problem Relation Age of Onset  . Cancer  Father   . Breast cancer Cousin   . Tuberculosis Mother   . Prostate cancer Father      Prior to Admission medications   Medication Sig Start Date End Date Taking? Authorizing Provider  Amlodipine-Valsartan-HCTZ (EXFORGE HCT) 5-160-12.5 MG TABS Take 1 tablet by  mouth daily.   Yes Historical Provider, MD  anastrozole (ARIMIDEX) 1 MG tablet Take 1 tablet (1 mg total) by mouth daily. 04/15/13  Yes Deatra Robinson, MD  calcium carbonate (OS-CAL) 600 MG TABS Take 600 mg by mouth daily.   Yes Historical Provider, MD  Cholecalciferol (VITAMIN D-3) 1000 UNITS CAPS Take 1,000 Units by mouth daily.   Yes Historical Provider, MD  diclofenac sodium (VOLTAREN) 1 % GEL Apply 2 g topically 3 (three) times daily as needed. For arthritis pain   Yes Historical Provider, MD  fluticasone (FLONASE) 50 MCG/ACT nasal spray Place 2 sprays into both nostrils daily as needed for allergies or rhinitis. 11/08/13  Yes Ripudeep Krystal Eaton, MD  folic acid (FOLVITE) 595 MCG tablet Take 400-800 mcg by mouth daily.   Yes Historical Provider, MD  loratadine (CLARITIN) 10 MG tablet Take 1 tablet (10 mg total) by mouth daily as needed for allergies or rhinitis. 11/08/13  Yes Ripudeep Krystal Eaton, MD  Omega-3 Fatty Acids (FISH OIL) 500 MG CAPS Take 1 capsule by mouth daily.   Yes Historical Provider, MD  omeprazole (PRILOSEC) 20 MG capsule Take 1 capsule (20 mg total) by mouth daily. 11/08/13  Yes Ripudeep Krystal Eaton, MD  vitamin C (ASCORBIC ACID) 500 MG tablet Take 500 mg by mouth daily.   Yes Historical Provider, MD   Physical Exam: Filed Vitals:   03/06/14 1641  BP: 135/43  Pulse: 87  Temp: 98 F (36.7 C)  Resp: 18   Constitutional: Oriented to person, place, and time. Well-developed and well-nourished. Cooperative.  Head: Normocephalic and atraumatic.  Nose: Nose normal.  Mouth/Throat: Uvula is midline, oropharynx is clear and moist and mucous membranes are normal.  Eyes: Conjunctivae and EOM are normal. Pupils are equal, round, and reactive to light.  Neck: Trachea normal and normal range of motion. Neck supple.  Cardiovascular: Normal rate, regular rhythm, S1 normal, S2 normal, normal heart sounds and intact distal pulses.   Pulmonary/Chest: Effort normal and breath sounds normal.  Abdominal:  Soft. Bowel sounds are normal. There is no hepatosplenomegaly. There is no tenderness.  Musculoskeletal: Normal range of motion.  Neurological: Alert and oriented to person, place, and time. He has normal strength. No cranial nerve deficit or sensory deficit.  Skin: Skin is warm, dry and intact.  Psychiatric: Has a normal mood and affect. Speech is normal and behavior is normal.   Labs on Admission:  Basic Metabolic Panel:  Recent Labs Lab 03/06/14 1230  NA 139  K 5.2  CL 102  CO2 23  GLUCOSE 130*  BUN 26*  CREATININE 1.59*  CALCIUM 9.7   Liver Function Tests: No results found for this basename: AST, ALT, ALKPHOS, BILITOT, PROT, ALBUMIN,  in the last 168 hours No results found for this basename: LIPASE, AMYLASE,  in the last 168 hours No results found for this basename: AMMONIA,  in the last 168 hours CBC:  Recent Labs Lab 03/06/14 1230  WBC 8.5  HGB 8.8*  HCT 26.4*  MCV 94.6  PLT 302   Cardiac Enzymes:  Recent Labs Lab 03/06/14 1230  TROPONINI <0.30    BNP (last 3 results) No results found for this basename: PROBNP,  in the last 8760 hours CBG: No results found for this basename: GLUCAP,  in the last 168 hours  Radiological Exams on Admission: Dg Chest 2 View  03/06/2014   CLINICAL DATA:  Syncope and cold sweats  EXAM: CHEST  2 VIEW  COMPARISON:  None.  FINDINGS: The heart size and mediastinal contours are within normal limits. Atherosclerotic disease involves the thoracic aorta. Both lungs are clear. The visualized skeletal structures are unremarkable.  IMPRESSION: 1. No acute cardiopulmonary abnormalities. 2. Atherosclerosis   Electronically Signed   By: Kerby Moors M.D.   On: 03/06/2014 13:27    EKG: Independently reviewed.   Assessment/Plan Principal Problem:   Near syncope Active Problems:   Hypertension   Breast cancer   Anemia   CKD (chronic kidney disease), stage III    Near syncope -The history is very consistent with vasovagal attack,  having near syncope while she is trying to defecate. -Patient has same symptoms before, at that time that coincides with anemia. -Hemoglobin is 8.8, no indication for transfusion. -Will hold blood pressure medications, give IV fluids and ambulate in AM.  Anemia -Anemia of chronic disease, patient use to get Aranesp every 4 weeks. -Stool Hemoccult is negative, not microcytic anemia, defer further GI workup. -Aranesp prescription for the past 2 month because of hemoglobin being greater than 10. -Patient does not need iron, she is not microcytic, last iron study from 4/2 showed normal iron and ferritin. -Give Aranesp and patient to followup with hematology as outpatient. -If hemoglobin dropped to 8.0 after hydration and transfuse 1 units of packed RBCs in the a.m.  CKD stage 2-3 -Creatinine baseline at 1.59, potassium is 5.2 (on Valsartan). -Patient is valsartan and HCTZ hold both, give Kayexalate for the marginally high potassium. -Considered to discontinue valsartan/HCTZ on discharge because of recurrent syncope/CKD stage III.  Hypertension -Hold blood pressure medication patient is on Exforge HCT (amlodipine-valsartan-HCTZ).  -As mentioned above we'll likely not to restart valsartan/HCTZ on discharge  Breast cancer -Patient follows with Dr. Humphrey Rolls in the [facility], continue Arimidex.  Code Status: Full code Family Communication: Plan discussed with the patient in the presence of her son Ronalee Belts) at bedside.  Disposition Plan: Observation, telemetry  Time spent: 70 minutes  [facility] Pager [phone]

## 2014-03-06 NOTE — ED Notes (Signed)
Patient transported to X-ray 

## 2014-03-06 NOTE — ED Notes (Signed)
Onset 8am pt was sitting on toilet having bowel movement and started feeling dizzy, sweating and felt like she was going to pass out.  Pt went back to bed and after 5 min symptoms stopped.  Pt has been feeling fatigued x 1 week.  Is due to have iron checked next week.

## 2014-03-06 NOTE — ED Notes (Signed)
Pt was ordered to ambulate in the hall. Pt refused to get up. Pt states that she was weak and she was tired. Pt states that she didn't feel like getting up out of the bed. Dr. Betsey Holiday was notified.

## 2014-03-06 NOTE — ED Notes (Signed)
Pt reports having fatigue all week. This am got up to use restroom and felt near syncopal. Reports getting diaphoretic and vision changes which relieved with rest. ekg done at triage.

## 2014-03-06 NOTE — ED Provider Notes (Signed)
CSN: [id number]     Arrival date & time 03/06/14  1200 History   First MD Initiated Contact with Patient 03/06/14 1215     Chief Complaint  Patient presents with  . Near Syncope     (Consider location/radiation/quality/duration/timing/severity/associated sxs/prior Treatment) HPI  Patient presents to the ER for evaluation of near syncope. Patient reports that she has been feeling poorly all week. It started with nasal congestion and cough. She thought she had a cold or allergies. She has been feeling more fatigued than usual. Today she was on the toilet, tried to stand up and felt suddenly very dizzy, like she was going to pass out. There was no loss of consciousness. She denies chest pain and shortness of breath.  Past Medical History  Diagnosis Date  . Anemia of chronic disease   . Anxiety   . Hypertension   . Hepatitis   . Lipid disorder   . Wears glasses   . Renal insufficiency   . Wears dentures     top  . Breast cancer 08/28/12    IDC left breast bx=invasive ductal ca,ER/PR=+  . Breast cancer 09/01/2012  . Radiation 11/12/12-12/15/11    Left Breast/50 Gy   Past Surgical History  Procedure Laterality Date  . Abdominal hysterectomy       approx 20 years ago  . Appendectomy      20 years ago  . Foot surgery  1992    bone spurs both feet  . Colonoscopy  2009?  [name] Partial mastectomy with needle localization and axillary sentinel lymph node bx  10/08/2012    Procedure: PARTIAL MASTECTOMY WITH NEEDLE LOCALIZATION AND AXILLARY SENTINEL LYMPH NODE BX;  Surgeon: Adin Hector, MD;  Location: [facility];  Service: General;  Laterality: Left;  Left partial mastectomy with Needle localization and Sentinel lymph node biospy  . Enteroscopy N/A 11/08/2013    Procedure: ENTEROSCOPY;  Surgeon: Beryle Beams, MD;  Location: [place];  Service: Endoscopy;  Laterality: N/A;   Family History  Problem Relation Age of Onset  . Cancer Father   . Breast cancer Cousin   .  Tuberculosis Mother   . Prostate cancer Father    History  Substance Use Topics  . Smoking status: Former Smoker -- 1.00 packs/day    Types: Cigarettes    Quit date: 10/27/2007  . Smokeless tobacco: Never Used     Comment: started smoking age 49  . Alcohol Use: 0.6 oz/week    1 Glasses of wine per week     Comment:  glass wine per week   OB History   Grav Para Term Preterm Abortions TAB SAB Ect Mult Living                 Review of Systems  Constitutional: Positive for fatigue.  HENT: Positive for congestion.   Respiratory: Positive for cough. Negative for shortness of breath.   Cardiovascular: Negative for chest pain.  All other systems reviewed and are negative.     Allergies  Review of patient's allergies indicates no known allergies.  Home Medications   Prior to Admission medications   Medication Sig Start Date End Date Taking? Authorizing Provider  allopurinol (ZYLOPRIM) 100 MG tablet Take 100 mg by mouth daily.     Historical Provider, MD  Amlodipine-Valsartan-HCTZ (EXFORGE HCT) 5-160-12.5 MG TABS Take 1 tablet by mouth daily.    Historical Provider, MD  anastrozole (ARIMIDEX) 1 MG tablet Take 1 tablet (1 mg total) by  mouth daily. 04/15/13   Deatra Robinson, MD  calcium carbonate (OS-CAL) 600 MG TABS Take 600 mg by mouth daily.    Historical Provider, MD  Cholecalciferol (VITAMIN D-3) 1000 UNITS CAPS Take 1,000 Units by mouth daily.    Historical Provider, MD  diclofenac sodium (VOLTAREN) 1 % GEL Apply 2 g topically 3 (three) times daily as needed. For arthritis pain    Historical Provider, MD  FeFum-FePoly-FA-B Cmp-C-Biot (INTEGRA PLUS) CAPS Take 1 capsule by mouth daily. 05/24/13   Minette Headland, NP  fluticasone (FLONASE) 50 MCG/ACT nasal spray Place 2 sprays into both nostrils daily as needed for allergies or rhinitis. 11/08/13   Ripudeep Krystal Eaton, MD  folic acid (FOLVITE) 852 MCG tablet Take 400-800 mcg by mouth daily.    Historical Provider, MD  lactulose  (CHRONULAC) 10 GM/15ML solution Take 15 mLs (10 g total) by mouth as directed. Take three 15 mL doses for ONE day. 01/21/14   Minette Headland, NP  loratadine (CLARITIN) 10 MG tablet Take 1 tablet (10 mg total) by mouth daily as needed for allergies or rhinitis. 11/08/13   Ripudeep Krystal Eaton, MD  Omega-3 Fatty Acids (FISH OIL) 500 MG CAPS Take 1 capsule by mouth daily.    Historical Provider, MD  omeprazole (PRILOSEC) 20 MG capsule Take 1 capsule (20 mg total) by mouth daily. 11/08/13   Ripudeep Krystal Eaton, MD  vitamin C (ASCORBIC ACID) 500 MG tablet Take 500 mg by mouth daily.    Historical Provider, MD  Wound Cleansers (RADIAPLEX EX) Apply topically.    Historical Provider, MD   BP 122/49  Pulse 77  Temp(Src) 98.4 F (36.9 C) (Oral)  Resp 20  Ht 5\' 2"  (1.575 m)  Wt 130 lb (58.968 kg)  BMI 23.77 kg/m2  SpO2 99% Physical Exam  Constitutional: She is oriented to person, place, and time. She appears well-developed and well-nourished. No distress.  HENT:  Head: Normocephalic and atraumatic.  Right Ear: Hearing normal.  Left Ear: Hearing normal.  Nose: Nose normal.  Mouth/Throat: Oropharynx is clear and moist and mucous membranes are normal.  Eyes: Conjunctivae and EOM are normal. Pupils are equal, round, and reactive to light.  Neck: Normal range of motion. Neck supple.  Cardiovascular: Regular rhythm, S1 normal and S2 normal.  Exam reveals no gallop and no friction rub.   No murmur heard. Pulmonary/Chest: Effort normal and breath sounds normal. No respiratory distress. She exhibits no tenderness.  Abdominal: Soft. Normal appearance and bowel sounds are normal. There is no hepatosplenomegaly. There is no tenderness. There is no rebound, no guarding, no tenderness at McBurney's point and negative Murphy's sign. No hernia.  Musculoskeletal: Normal range of motion.  Neurological: She is alert and oriented to person, place, and time. She has normal strength. No cranial nerve deficit or sensory deficit.  Coordination normal. GCS eye subscore is 4. GCS verbal subscore is 5. GCS motor subscore is 6.  Extraocular muscle movement: normal No visual field cut Pupils: equal and reactive both direct and consensual response is normal No nystagmus present           Sensory function is intact to light touch, pinprick Proprioception intact  Grip strength 5/5 symmetric in upper extremities Lower extremity strength 5/5 against gravity No pronator drift Normal finger to nose bilaterally Normal heel to shin bilaterally  Gait: normal    Skin: Skin is warm, dry and intact. No rash noted. No cyanosis.  Psychiatric: She has a normal mood and  affect. Her speech is normal and behavior is normal. Thought content normal.    ED Course  Procedures (including critical care time) Labs Review Labs Reviewed  CBC - Abnormal; Notable for the following:    RBC 2.79 (*)    Hemoglobin 8.8 (*)    HCT 26.4 (*)    All other components within normal limits  BASIC METABOLIC PANEL - Abnormal; Notable for the following:    Glucose, Bld 130 (*)    BUN 26 (*)    Creatinine, Ser 1.59 (*)    GFR calc non Af Amer 30 (*)    GFR calc Af Amer 35 (*)    All other components within normal limits  URINALYSIS, ROUTINE W REFLEX MICROSCOPIC - Abnormal; Notable for the following:    Leukocytes, UA TRACE (*)    All other components within normal limits  URINE MICROSCOPIC-ADD ON - Abnormal; Notable for the following:    Squamous Epithelial / LPF FEW (*)    All other components within normal limits  TROPONIN I  POC OCCULT BLOOD, ED    Imaging Review Dg Chest 2 View  03/06/2014   CLINICAL DATA:  Syncope and cold sweats  EXAM: CHEST  2 VIEW  COMPARISON:  None.  FINDINGS: The heart size and mediastinal contours are within normal limits. Atherosclerotic disease involves the thoracic aorta. Both lungs are clear. The visualized skeletal structures are unremarkable.  IMPRESSION: 1. No acute cardiopulmonary abnormalities. 2.  Atherosclerosis   Electronically Signed   By: Kerby Moors M.D.   On: 03/06/2014 13:27     EKG Interpretation   Date/Time:  Sunday Mar 06 2014 12:05:25 EDT Ventricular Rate:  87 PR Interval:  206 QRS Duration: 76 QT Interval:  362 QTC Calculation: 435 R Axis:   50 Text Interpretation:  Normal sinus rhythm Nonspecific ST abnormality  Abnormal ECG No significant change since last tracing Confirmed by Asaph Serena   MD, Lukas Pelcher [phone]) on 03/06/2014 12:18:25 PM      MDM   Final diagnoses:  Anemia  Dyspnea on exertion  Generalized weakness    Patient presents to the ER for evaluation of generalized weakness. Patient reports that she has had progressively worsening fatigue over the past one week. She says she has a history of recurrent anemia and when her blood counts are low, she feels this way. Reviewing her records reveals that she does see him on 4 anemia of chronic disease, treated with iron. She does also, however, have a history of multiple gastric and small bowel AVMs recently visualized on endoscopy in January of this year.  She does not experiencing any chest pain. She does, however, have significant dyspnea on exertion which has worsened this week. EKG unchanged from previous. Troponin negative. Exertional dyspnea with anemia could be an anginal equivalent, will need further evaluation.  Rectal exam reveals heme negative stool, no evidence of ongoing bleeding to explain the drop in her hemoglobin. She is borderline, and 8.8, no clear indication for transfusion at this time, but if she drops further, would benefit from transfusion.  She is not orthostatic, but testing was difficult here in the ER. Patient reports severe weakness and inability to hold her own weight at the bedside.  There are no symptoms of vertigo. No concern for stroke or central vertigo in this patient. Symptoms are of extreme fatigue and dyspnea on exertion.  Based on the patient's symptomatology and drop  in hemoglobin, I have asked the hospitalist service to observe her in the  hospital overnight, obtain serial hemoglobin and consider transfusion if her hemoglobin continues to drop.    Orpah Greek, MD 03/06/14 [phone]

## 2014-03-06 NOTE — Progress Notes (Signed)
MEDICATION RELATED CONSULT NOTE - INITIAL   Pharmacy Consult for aranesp Indication: anemia of CKD  No Known Allergies  Patient Measurements: Height: 5\' 2"  (157.5 cm) Weight: 130 lb (58.968 kg) IBW/kg (Calculated) : 50.1  Vital Signs: Temp: 98 F (36.7 C) (05/17 1641) Temp src: Oral (05/17 1222) BP: 135/43 mmHg (05/17 1641) Pulse Rate: 87 (05/17 1641) Intake/Output from previous day:   Intake/Output from this shift: Total I/O In: 500 [I.V.:500] Out: -   Labs:  Recent Labs  03/06/14 1230  WBC 8.5  HGB 8.8*  HCT 26.4*  PLT 302  CREATININE 1.59*   Estimated Creatinine Clearance: 22.7 ml/min (by C-G formula based on Cr of 1.59).   Microbiology: No results found for this or any previous visit (from the past 720 hour(s)).  Medical History: Past Medical History  Diagnosis Date  . Anemia of chronic disease   . Anxiety   . Hypertension   . Hepatitis   . Lipid disorder   . Wears glasses   . Renal insufficiency   . Wears dentures     top  . Breast cancer 08/28/12    IDC left breast bx=invasive ductal ca,ER/PR=+  . Breast cancer 09/01/2012  . Radiation 11/12/12-12/15/11    Left Breast/50 Gy    Medications:  Prescriptions prior to admission  Medication Sig Dispense Refill  . Amlodipine-Valsartan-HCTZ (EXFORGE HCT) 5-160-12.5 MG TABS Take 1 tablet by mouth daily.      [name] anastrozole (ARIMIDEX) 1 MG tablet Take 1 tablet (1 mg total) by mouth daily.  30 tablet  12  . calcium carbonate (OS-CAL) 600 MG TABS Take 600 mg by mouth daily.      . Cholecalciferol (VITAMIN D-3) 1000 UNITS CAPS Take 1,000 Units by mouth daily.      . diclofenac sodium (VOLTAREN) 1 % GEL Apply 2 g topically 3 (three) times daily as needed. For arthritis pain      . fluticasone (FLONASE) 50 MCG/ACT nasal spray Place 2 sprays into both nostrils daily as needed for allergies or rhinitis.  16 g  2  . folic acid (FOLVITE) 329 MCG tablet Take 400-800 mcg by mouth daily.      [name] loratadine (CLARITIN)  10 MG tablet Take 1 tablet (10 mg total) by mouth daily as needed for allergies or rhinitis.  30 tablet  3  . Omega-3 Fatty Acids (FISH OIL) 500 MG CAPS Take 1 capsule by mouth daily.      [name] omeprazole (PRILOSEC) 20 MG capsule Take 1 capsule (20 mg total) by mouth daily.  30 capsule  3  . vitamin C (ASCORBIC ACID) 500 MG tablet Take 500 mg by mouth daily.        Assessment: 36 yof presented to the hospital after near syncope. To start aranesp for anemia of chronic kidney disease. She had been on aranesp 375mcg monthly until recently when her hemoglobin had been too high for administration. Today H/H is 8.8/26.4, plts 302. No bleeding noted. Also on SQ heparin for VTE prophylaxis.   Plan:  1. Aranesp 76mcg SQ weekly (dosed weekly inpatient >> close to her previous regimen) 2. Check anemia panel - may need iron supplementation depending on results  [place] 03/06/2014,6:01 PM

## 2014-03-07 DIAGNOSIS — R55 Syncope and collapse: Secondary | ICD-10-CM | POA: Diagnosis not present

## 2014-03-07 LAB — BASIC METABOLIC PANEL
BUN: 16 mg/dL (ref 6–23)
CO2: 23 meq/L (ref 19–32)
Calcium: 9 mg/dL (ref 8.4–10.5)
Chloride: 107 mEq/L (ref 96–112)
Creatinine, Ser: 1.27 mg/dL — ABNORMAL HIGH (ref 0.50–1.10)
GFR calc Af Amer: 45 mL/min — ABNORMAL LOW (ref >90)
GFR, EST NON AFRICAN AMERICAN: 39 mL/min — AB (ref >90)
GLUCOSE: 99 mg/dL (ref 70–99)
Potassium: 3.9 mEq/L (ref 3.7–5.3)
SODIUM: 143 meq/L (ref 137–147)

## 2014-03-07 LAB — CBC
HCT: 23.7 % — ABNORMAL LOW (ref 36.0–46.0)
HEMOGLOBIN: 7.8 g/dL — AB (ref 12.0–15.0)
MCH: 31 pg (ref 26.0–34.0)
MCHC: 32.9 g/dL (ref 30.0–36.0)
MCV: 94 fL (ref 78.0–100.0)
Platelets: 264 10*3/uL (ref 150–400)
RBC: 2.52 MIL/uL — ABNORMAL LOW (ref 3.87–5.11)
RDW: 14.4 % (ref 11.5–15.5)
WBC: 5.1 10*3/uL (ref 4.0–10.5)

## 2014-03-07 LAB — VITAMIN B12: Vitamin B-12: 853 pg/mL (ref 211–911)

## 2014-03-07 LAB — FOLATE: Folate: >20 ng/mL

## 2014-03-07 LAB — IRON AND TIBC
Iron: 24 ug/dL — ABNORMAL LOW (ref 42–135)
Saturation Ratios: 5 % — ABNORMAL LOW (ref 20–55)
TIBC: 447 ug/dL (ref 250–470)
UIBC: 423 ug/dL — ABNORMAL HIGH (ref 125–400)

## 2014-03-07 LAB — TSH: TSH: 1.28 u[IU]/mL (ref 0.350–4.500)

## 2014-03-07 LAB — FERRITIN: Ferritin: 40 ng/mL (ref 10–291)

## 2014-03-07 MED ORDER — CALCIUM CARBONATE 1250 (500 CA) MG PO TABS
1.0000 | ORAL_TABLET | Freq: Every day | ORAL | Status: DC
  Administered 2014-03-07 – 2014-03-10 (×4): 500 mg via ORAL
  Filled 2014-03-07 (×5): qty 1

## 2014-03-07 NOTE — Progress Notes (Signed)
Patient ID: [name], female   DOB: August 01, 1932, 80 y.o.   MRN: [id number]  [facility] PROGRESS NOTE  [name] KCL:[id number] DOB: 06/25/1932 DOA: 03/06/2014 PCP: Elyn Peers, MD  Brief narrative: 80 y.o. female with past medical history of CKD stage 2-3, history of GI bleed secondary to AVMs and breast cancer. Patient came into the hospital because of near syncope. Patient said earlier in the day PTA she was in the toilet bowl trying to have BM when she felt lightheaded, she did not lose her consciousness, she did not fall. She managed to go back to the bed but she started to feel clammy and sweaty. She had the same exact episode in January of this year. Denies any chest pain, shortness of breath, any prodrome type of symptoms prior to the episode. She mentioned she had some runny nose and cough which resolved now which is likely secondary to seasonal allergies.   In the ED she was found to have hemoglobin of 8.8, on 4/30 her hemoglobin was 11.3. Creatinine 1.59, stool Hemoccult is negative. Patient used to get Aranesp shots, she didn't have it for the past 2 months because of hemoglobin >10.   Principal Problem:   Near syncope - unclear etiology possibly form acute blood loss anemia of unclear etiology - BP is stable this AM - will have orthostatic vitals checked today, repeat 12 lead EKG, 2 D ECHO - PT evaluation - will repeat CBC in AM and if Hg < 7.5, will plan on transfusion  Active Problems:   Acute on chronic blood loss anemia - drop in hg from her recent baseline of ~10 - will repeat CBC in AM and will need to transfuse as noted above if Hg < 7.5 - will provide aranesp    Hypertension - reasonable inpatient control    Breast cancer - outpatient follow up  - continue anastrozole    CKD (chronic kidney disease), stage III - IVF provided and pt tolerating well and responding well - Cr trending down - continue IVF for now   Consultants:  None    Procedures/Studies:  Dg Chest 2 View  03/06/2014  No acute cardiopulmonary abnormalities. Atherosclerosis     Antibiotics:  None  Code Status: Full Family Communication: Pt at bedside Disposition Plan: Home when medically stable  HPI/Subjective: No events overnight.   Objective: Filed Vitals:   03/06/14 1611 03/06/14 1641 03/06/14 2050 03/07/14 0536  BP: 126/56 135/43 113/41 117/41  Pulse: 78 87 68 79  Temp:  98 F (36.7 C) 97.3 F (36.3 C) 97.9 F (36.6 C)  TempSrc:      Resp: 16 18 18 18   Height:      Weight:      SpO2: 100% 99% 97% 100%    Intake/Output Summary (Last 24 hours) at 03/07/14 1119 Last data filed at 03/07/14 4944  Gross per 24 hour  Intake   2060 ml  Output      0 ml  Net   2060 ml    Exam:   General:  Pt is alert, follows commands appropriately, not in acute distress  Cardiovascular: Regular rate and rhythm, S1/S2, no murmurs, no rubs, no gallops  Respiratory: Clear to auscultation bilaterally, no wheezing, no crackles, no rhonchi  Abdomen: Soft, non tender, non distended, bowel sounds present, no guarding  Extremities: No edema, pulses DP and PT palpable bilaterally  Neuro: Grossly nonfocal  Data Reviewed: Basic Metabolic Panel:  Recent Labs Lab 03/06/14 1230 03/07/14 9675  NA 139 143  K 5.2 3.9  CL 102 107  CO2 23 23  GLUCOSE 130* 99  BUN 26* 16  CREATININE 1.59* 1.27*  CALCIUM 9.7 9.0   CBC:  Recent Labs Lab 03/06/14 1230 03/07/14 0624  WBC 8.5 5.1  HGB 8.8* 7.8*  HCT 26.4* 23.7*  MCV 94.6 94.0  PLT 302 264   Cardiac Enzymes:  Recent Labs Lab 03/06/14 1230  TROPONINI <0.30    Scheduled Meds: . anastrozole  1 mg Oral Daily  . calcium carbonate  1 tablet Oral Q breakfast  . ARANESP  60 mcg Subcutaneous Q Sun-1800  . folic acid  0.5 mg Oral Daily  . pantoprazole  40 mg Oral Daily  . vitamin C  500 mg Oral Daily   Continuous Infusions: . sodium chloride 100 mL/hr at 03/06/14 1817   Theodis Blaze,  MD  [facility] Pager [phone]  If 7PM-7AM, please contact night-coverage [email or web address] Password [email or web address] 03/07/2014, 11:19 AM   LOS: 1 day

## 2014-03-07 NOTE — Evaluation (Signed)
Physical Therapy Evaluation Patient Details Name: [name] MRN: [id number] DOB: 1932-02-16 Today's Date: 03/07/2014   History of Present Illness  pt rpesents after near syncopal episode while using bathroom.    Clinical Impression  Pt mildly unsteady during amb and endorses feeling of being "a little" dizzy immediately after amb, but resolves with rest.  Feel pt would benefit from OPPT for high level balance activities.  Will continue to follow.      Follow Up Recommendations Outpatient PT;Supervision - Intermittent    Equipment Recommendations  None recommended by PT    Recommendations for Other Services       Precautions / Restrictions Precautions Precautions: Fall Restrictions Weight Bearing Restrictions: No      Mobility  Bed Mobility Overal bed mobility: Modified Independent                Transfers Overall transfer level: Modified independent Equipment used: None                Ambulation/Gait Ambulation/Gait assistance: Supervision Ambulation Distance (Feet): 120 Feet Assistive device: None Gait Pattern/deviations: Step-through pattern;Decreased stride length     General Gait Details: pt moves slowly and cautiously.  pt expresses worry about syncopal episode happening again.  pt does endorse mild dizziness at very end of amb, resolved with rest.    Stairs            Wheelchair Mobility    Modified Rankin (Stroke Patients Only)       Balance Overall balance assessment: Needs assistance         Standing balance support: No upper extremity supported Standing balance-Leahy Scale: Fair                               Pertinent Vitals/Pain Denied pain.      Home Living Family/patient expects to be discharged to:: Private residence Living Arrangements: Children Available Help at Discharge: Family;Available PRN/intermittently (Son works during the day) Type of Home: House Home Access: Level entry     Home  Layout: One [place]: None      Prior Function Level of Independence: Independent               Hand Dominance        Extremity/Trunk Assessment   Upper Extremity Assessment: Overall WFL for tasks assessed           Lower Extremity Assessment: Overall WFL for tasks assessed      Cervical / Trunk Assessment: Normal  Communication   Communication: No difficulties  Cognition Arousal/Alertness: Awake/alert Behavior During Therapy: WFL for tasks assessed/performed Overall Cognitive Status: Within Functional Limits for tasks assessed                      General Comments      Exercises        Assessment/Plan    PT Assessment Patient needs continued PT services  PT Diagnosis Difficulty walking   PT Problem List Decreased activity tolerance;Decreased balance;Decreased mobility  PT Treatment Interventions DME instruction;Gait training;Functional mobility training;Therapeutic activities;Therapeutic exercise;Balance training;Patient/family education   PT Goals (Current goals can be found in the Care Plan section) Acute Rehab PT Goals Patient Stated Goal: To stop passing out.   PT Goal Formulation: With patient Time For Goal Achievement: 03/14/14 Potential to Achieve Goals: Good    Frequency Min 3X/week   Barriers to discharge  Co-evaluation               End of Session Equipment Utilized During Treatment: Gait belt Activity Tolerance: Patient tolerated treatment well Patient left: in chair;with call bell/phone within reach;with family/visitor present Nurse Communication: Mobility status    Functional Assessment Tool Used: Clinical Judgement Functional Limitation: Mobility: Walking and moving around Mobility: Walking and Moving Around Current Status (O6712): At least 1 percent but less than 20 percent impaired, limited or restricted Mobility: Walking and Moving Around Goal Status [phone]): 0 percent impaired, limited or  restricted    Time: 9833-8250 PT Time Calculation (min): 14 min   Charges:   PT Evaluation $Initial PT Evaluation Tier I: 1 Procedure PT Treatments $Gait Training: 8-22 mins   PT G Codes:   Functional Assessment Tool Used: Clinical Judgement Functional Limitation: Mobility: Walking and moving around    [employer], [place] [phone] 03/07/2014, 12:12 PM

## 2014-03-07 NOTE — Progress Notes (Signed)
UR completed 

## 2014-03-08 ENCOUNTER — Observation Stay (HOSPITAL_COMMUNITY): Payer: PRIVATE HEALTH INSURANCE

## 2014-03-08 ENCOUNTER — Encounter (HOSPITAL_COMMUNITY): Payer: Self-pay | Admitting: Radiology

## 2014-03-08 DIAGNOSIS — R55 Syncope and collapse: Secondary | ICD-10-CM | POA: Diagnosis not present

## 2014-03-08 LAB — BASIC METABOLIC PANEL
BUN: 13 mg/dL (ref 6–23)
CHLORIDE: 111 meq/L (ref 96–112)
CO2: 22 meq/L (ref 19–32)
CREATININE: 1.2 mg/dL — AB (ref 0.50–1.10)
Calcium: 8.9 mg/dL (ref 8.4–10.5)
GFR calc Af Amer: 48 mL/min — ABNORMAL LOW (ref >90)
GFR calc non Af Amer: 42 mL/min — ABNORMAL LOW (ref >90)
GLUCOSE: 95 mg/dL (ref 70–99)
Potassium: 3.9 mEq/L (ref 3.7–5.3)
Sodium: 144 mEq/L (ref 137–147)

## 2014-03-08 LAB — CBC
HCT: 24.7 % — ABNORMAL LOW (ref 36.0–46.0)
Hemoglobin: 8.1 g/dL — ABNORMAL LOW (ref 12.0–15.0)
MCH: 30.9 pg (ref 26.0–34.0)
MCHC: 32.8 g/dL (ref 30.0–36.0)
MCV: 94.3 fL (ref 78.0–100.0)
Platelets: 265 10*3/uL (ref 150–400)
RBC: 2.62 MIL/uL — AB (ref 3.87–5.11)
RDW: 14.2 % (ref 11.5–15.5)
WBC: 3.9 10*3/uL — ABNORMAL LOW (ref 4.0–10.5)

## 2014-03-08 LAB — PREPARE RBC (CROSSMATCH)

## 2014-03-08 NOTE — Progress Notes (Signed)
Patient ID: [name], female   DOB: Mar 09, 1932, 80 y.o.   MRN: [id number]  [facility] PROGRESS NOTE  Journii Nierman ATF:[id number] DOB: 12/03/1931 DOA: 03/06/2014 PCP: Elyn Peers, MD  Brief narrative:  80 y.o. female with past medical history of CKD stage 2-3, history of GI bleed secondary to AVMs and breast cancer. Patient came into the hospital because of near syncope. Patient said earlier in the day PTA she was in the toilet bowl trying to have BM when she felt lightheaded, she did not lose her consciousness, she did not fall. She managed to go back to the bed but she started to feel clammy and sweaty. She had the same exact episode in January of this year. Denies any chest pain, shortness of breath, any prodrome type of symptoms prior to the episode. She mentioned she had some runny nose and cough which resolved now which is likely secondary to seasonal allergies.   In the ED she was found to have hemoglobin of 8.8, on 4/30 her hemoglobin was 11.3. Creatinine 1.59, stool Hemoccult is negative. Patient used to get Aranesp shots, she didn't have it for the past 2 months because of hemoglobin >10.   Principal Problem:  Near syncope  - unclear etiology possibly form acute blood loss anemia of unclear etiology  - BP is stable this AM  - will have orthostatic vitals checked again as I am not able to find record of the ne obtained yesterday - 2 D ECHO pending  - transfuse two units of blood  Active Problems:  Acute on chronic blood loss anemia  - drop in hg from her recent baseline of ~10  - transfuse two units of PRBC 03/08/2014 - will provide aranesp  - repeat CBC in AM Hypertension  - reasonable inpatient control  Breast cancer  - outpatient follow up  - continue anastrozole  CKD (chronic kidney disease), stage III  - IVF provided and pt tolerating well and responding well  - Cr trending down  - continue IVF for now  Right flank area pain - will ask for abd CT  w/o contrast rule out kidney stone   Consultants:  None  Procedures/Studies:  Dg Chest 2 View 03/06/2014 No acute cardiopulmonary abnormalities. Atherosclerosis  Antibiotics:  None  Code Status: Full  Family Communication: Pt at bedside  Disposition Plan: Home when medically stable   HPI/Subjective: No events overnight.   Objective: Filed Vitals:   03/07/14 1300 03/07/14 2100 03/08/14 0549 03/08/14 1300  BP: 109/57 123/39 126/55 156/56  Pulse: 74 72 79 88  Temp: 98 F (36.7 C) 97.8 F (36.6 C) 97.8 F (36.6 C) 98 F (36.7 C)  TempSrc:  Oral Oral   Resp: 18 18 18 18   Height:      Weight:      SpO2: 99% 100% 100% 100%    Intake/Output Summary (Last 24 hours) at 03/08/14 1328 Last data filed at 03/08/14 1300  Gross per 24 hour  Intake   1150 ml  Output      0 ml  Net   1150 ml    Exam:   General:  Pt is alert, follows commands appropriately, not in acute distress  Cardiovascular: Regular rate and rhythm, S1/S2, no murmurs, no rubs, no gallops  Respiratory: Clear to auscultation bilaterally, no wheezing, no crackles, no rhonchi  Abdomen: Soft, non tender, non distended, bowel sounds present, no guarding  Extremities: No edema, pulses DP and PT palpable bilaterally  Neuro: Grossly nonfocal  Data Reviewed: Basic Metabolic Panel:  Recent Labs Lab 03/06/14 1230 03/07/14 0624 03/08/14 0450  NA 139 143 144  K 5.2 3.9 3.9  CL 102 107 111  CO2 23 23 22   GLUCOSE 130* 99 95  BUN 26* 16 13  CREATININE 1.59* 1.27* 1.20*  CALCIUM 9.7 9.0 8.9   CBC:  Recent Labs Lab 03/06/14 1230 03/07/14 0624 03/08/14 0450  WBC 8.5 5.1 3.9*  HGB 8.8* 7.8* 8.1*  HCT 26.4* 23.7* 24.7*  MCV 94.6 94.0 94.3  PLT 302 264 265   Cardiac Enzymes:  Recent Labs Lab 03/06/14 1230  TROPONINI <0.30   Scheduled Meds: . anastrozole  1 mg Oral Daily  . calcium carbonate  1 tablet Oral Q breakfast  . darbepoetin (ARANESP) injection - NON-DIALYSIS  60 mcg Subcutaneous Q  Sun-1800  . folic acid  0.5 mg Oral Daily  . pantoprazole  40 mg Oral Daily  . sodium chloride  3 mL Intravenous Q12H  . vitamin C  500 mg Oral Daily   Continuous Infusions: . sodium chloride 75 mL/hr at 03/07/14 1200     Theodis Blaze, MD  [facility] Pager [phone]  If 7PM-7AM, please contact night-coverage [email or web address] Password [email or web address] 03/08/2014, 1:28 PM   LOS: 2 days

## 2014-03-09 DIAGNOSIS — I369 Nonrheumatic tricuspid valve disorder, unspecified: Secondary | ICD-10-CM

## 2014-03-09 DIAGNOSIS — R55 Syncope and collapse: Secondary | ICD-10-CM | POA: Diagnosis not present

## 2014-03-09 DIAGNOSIS — F411 Generalized anxiety disorder: Secondary | ICD-10-CM

## 2014-03-09 LAB — TYPE AND SCREEN
ABO/RH(D): B POS
Antibody Screen: NEGATIVE
UNIT DIVISION: 0
Unit division: 0

## 2014-03-09 LAB — CBC
HEMATOCRIT: 36.2 % (ref 36.0–46.0)
HEMOGLOBIN: 12.3 g/dL (ref 12.0–15.0)
MCH: 30.8 pg (ref 26.0–34.0)
MCHC: 34 g/dL (ref 30.0–36.0)
MCV: 90.5 fL (ref 78.0–100.0)
Platelets: 203 10*3/uL (ref 150–400)
RBC: 4 MIL/uL (ref 3.87–5.11)
RDW: 14.8 % (ref 11.5–15.5)
WBC: 4.6 10*3/uL (ref 4.0–10.5)

## 2014-03-09 LAB — BASIC METABOLIC PANEL
BUN: 9 mg/dL (ref 6–23)
CO2: 21 meq/L (ref 19–32)
Calcium: 9.4 mg/dL (ref 8.4–10.5)
Chloride: 108 mEq/L (ref 96–112)
Creatinine, Ser: 1.15 mg/dL — ABNORMAL HIGH (ref 0.50–1.10)
GFR calc Af Amer: 51 mL/min — ABNORMAL LOW (ref >90)
GFR calc non Af Amer: 44 mL/min — ABNORMAL LOW (ref >90)
GLUCOSE: 93 mg/dL (ref 70–99)
POTASSIUM: 3.8 meq/L (ref 3.7–5.3)
Sodium: 142 mEq/L (ref 137–147)

## 2014-03-09 NOTE — Progress Notes (Signed)
Echo Lab  2D Echocardiogram completed.  [place], RDCS 03/09/2014 10:56 AM

## 2014-03-09 NOTE — Progress Notes (Signed)
MEDICATION RELATED CONSULT NOTE - INITIAL   Pharmacy Consult for aranesp Indication: anemia of CKD  No Known Allergies  Patient Measurements: Height: 5\' 2"  (157.5 cm) Weight: 130 lb (58.968 kg) IBW/kg (Calculated) : 50.1  Vital Signs: Temp: 98.2 F (36.8 C) (05/20 0458) Temp src: Oral (05/20 0458) BP: 133/51 mmHg (05/20 0458) Pulse Rate: 65 (05/20 0458) Intake/Output from previous day: 05/19 0701 - 05/20 0700 In: 1010 [P.O.:240; I.V.:100; Blood:670] Out: -  Intake/Output from this shift: Total I/O In: 240 [P.O.:240] Out: -   Labs:  Recent Labs  03/07/14 0624 03/08/14 0450 03/09/14 0630  WBC 5.1 3.9* 4.6  HGB 7.8* 8.1* 12.3  HCT 23.7* 24.7* 36.2  PLT 264 265 203  CREATININE 1.27* 1.20* 1.15*   Estimated Creatinine Clearance: 31.4 ml/min (by C-G formula based on Cr of 1.15).   Microbiology: No results found for this or any previous visit (from the past 720 hour(s)).  Medical History: Past Medical History  Diagnosis Date  . Anemia of chronic disease   . Anxiety   . Hypertension   . Hepatitis   . Lipid disorder   . Wears glasses   . Renal insufficiency   . Wears dentures     top  . Breast cancer 08/28/12    IDC left breast bx=invasive ductal ca,ER/PR=+  . Breast cancer 09/01/2012  . Radiation 11/12/12-12/15/11    Left Breast/50 Gy    Medications:  Prescriptions prior to admission  Medication Sig Dispense Refill  . Amlodipine-Valsartan-HCTZ (EXFORGE HCT) 5-160-12.5 MG TABS Take 1 tablet by mouth daily.      [name] anastrozole (ARIMIDEX) 1 MG tablet Take 1 tablet (1 mg total) by mouth daily.  30 tablet  12  . calcium carbonate (OS-CAL) 600 MG TABS Take 600 mg by mouth daily.      . Cholecalciferol (VITAMIN D-3) 1000 UNITS CAPS Take 1,000 Units by mouth daily.      . diclofenac sodium (VOLTAREN) 1 % GEL Apply 2 g topically 3 (three) times daily as needed. For arthritis pain      . fluticasone (FLONASE) 50 MCG/ACT nasal spray Place 2 sprays into both nostrils  daily as needed for allergies or rhinitis.  16 g  2  . folic acid (FOLVITE) 952 MCG tablet Take 400-800 mcg by mouth daily.      [name] loratadine (CLARITIN) 10 MG tablet Take 1 tablet (10 mg total) by mouth daily as needed for allergies or rhinitis.  30 tablet  3  . Omega-3 Fatty Acids (FISH OIL) 500 MG CAPS Take 1 capsule by mouth daily.      [name] omeprazole (PRILOSEC) 20 MG capsule Take 1 capsule (20 mg total) by mouth daily.  30 capsule  3  . vitamin C (ASCORBIC ACID) 500 MG tablet Take 500 mg by mouth daily.        Assessment: 52 yof presented to the hospital after near syncope. Pharmacy to  dose aranesp for anemia of chronic kidney disease. She had been on aranesp 373mcg monthly until recently when her hemoglobin had been too high for administration.   Pt. Received 2 units PRBC on 5/19. hgb 8.1> 12.3,  Fe low = 25mcg/dl, saturation only 5%. Pt. Is on SCDs fro VTE prophylaxis  Plan:  - Continue Aranesp 10mcg SQ weekly (dosed weekly inpatient >> close to her previous regimen) - Recommend ferrous sulfate 325 mg PO TID, may also consider feraheme 510 mg IV x1, repeat one more dose after 3-5 days if no significant  [name], PharmD, BCPS  Clinical Pharmacist  Pager: [phone]   03/09/2014,11:32 AM

## 2014-03-09 NOTE — Progress Notes (Signed)
PT Cancellation Note  Patient Details Name: [name] MRN: [id number] DOB: 03/17/1932   Cancelled Treatment:    Reason Eval/Treat Not Completed: Other (comment) (Pt declined due to headache) Pt did not want to work with PT at this time. Notified RN of pts headache and encouraged pt to ambulate with nursing staff when headache better.     Vincent Peyer [facility] 03/09/2014, 11:25 AM Lavonia Dana, PT  [phone] 03/09/2014

## 2014-03-09 NOTE — Progress Notes (Signed)
[facility]                                                                              Patient Demographics  [name], is a 80 y.o. female, DOB - September 07, 1932, OVF:[id number]  Admit date - 03/06/2014   Admitting Physician Verlee Monte, MD  Outpatient Primary MD for the patient is Elyn Peers, MD  LOS - 3   Chief Complaint  Patient presents with  . Near Syncope      HPI 80 y.o. female with past medical history of CKD stage 2-3, history of GI bleed secondary to AVMs and breast cancer. Patient came into the hospital because of near syncope. Patient said earlier in the day PTA she was in the toilet bowl trying to have BM when she felt lightheaded, she did not lose her consciousness, she did not fall. She managed to go back to the bed but she started to feel clammy and sweaty. She had the same exact episode in January of this year. Denies any chest pain, shortness of breath, any prodrome type of symptoms prior to the episode. She mentioned she had some runny nose and cough which resolved now which is likely secondary to seasonal allergies.  In the ED she was found to have hemoglobin of 8.8, on 4/30 her hemoglobin was 11.3. Creatinine 1.59, stool Hemoccult is negative. Patient used to get Aranesp shots, she didn't have it for the past 2 months because of hemoglobin >10.   Assessment & Plan   Near syncope  -unclear etiology possibly form acute blood loss anemia of unclear etiology  -BP is stable this AM  -Will order orthostatic vitals again (as they have been ordered previously and not done)  -Echocardiogram pending -Patient received 2u PRBCs during hospital course  Acute on chronic blood loss anemia  -Baseline hemoglobin around 10-11, dopped to 7.8 -Received 2uPRBCs on 5/19 -Currently Hb 12.3 -Will continue Aranesp weekly -Continue to monitor CBC  Hypertension  -Currently controlled -Medication, Exforge, held  Breast cancer  -outpatient follow up  -continue  anastrozole   CKD (chronic kidney disease), stage III  -Resolved  -Patient was given IV fluid and responded well  -Creatinine currently 1.15   Right flank area pain -Improving -CT scan: No definite acute intra-abdominal or intrapelvic abnormalities  Code Status: Full  Family Communication:  none at bedside  Disposition Plan: Admitted, pending echocardiogram, likely discharge 03/10/2014  Time Spent in minutes   30 minutes  Procedures none  Consults  none  DVT Prophylaxis  SCDs  Lab Results  Component Value Date   PLT 203 03/09/2014    Medications  Scheduled Meds: . anastrozole  1 mg Oral Daily  . calcium carbonate  1 tablet Oral Q breakfast  . darbepoetin (ARANESP) injection - NON-DIALYSIS  60 mcg Subcutaneous Q Sun-1800  . folic acid  0.5 mg Oral Daily  . pantoprazole  40 mg Oral Daily  . sodium chloride  3 mL Intravenous Q12H  . vitamin C  500 mg Oral Daily   Continuous Infusions: . sodium chloride 50 mL/hr at 03/09/14 0131   PRN Meds:.acetaminophen, acetaminophen, alum & mag hydroxide-simeth, fluticasone, loratadine, morphine injection, ondansetron (ZOFRAN) IV, ondansetron  Antibiotics  none  Anti-infectives   None      Subjective:   [name] seen and examined today.  Patient still has right abdominal/flank pain, denies dysuria.  She denies dizziness, unless she gets up too quickly.   Currently denies any chest pain, shortness of breath.  Objective:   Filed Vitals:   03/08/14 2200 03/08/14 2215 03/09/14 0105 03/09/14 0458  BP: 135/54 132/58 136/61 133/51  Pulse: 70 72 70 65  Temp: 97.9 F (36.6 C) 97.7 F (36.5 C) 98.1 F (36.7 C) 98.2 F (36.8 C)  TempSrc: Oral Oral Oral Oral  Resp: 20 18 20 18   Height:      Weight:      SpO2: 98% 98% 99% 99%    Wt Readings from Last 3 Encounters:  03/06/14 58.968 kg (130 lb)  11/30/13 59.966 kg (132 lb 3.2 oz)  11/09/13 61.456 kg (135 lb 7.8 oz)     Intake/Output Summary (Last 24 hours) at  03/09/14 0738 Last data filed at 03/09/14 0105  Gross per 24 hour  Intake   1010 ml  Output      0 ml  Net   1010 ml    Exam  General: Well developed, well nourished, NAD, appears stated age  HEENT: NCAT, PERRLA, EOMI, Anicteic Sclera, mucous membranes moist.   Neck: Supple, no JVD, no masses  Cardiovascular: S1 S2 auscultated, no rubs, murmurs or gallops. Regular rate and rhythm.  Respiratory: Clear to auscultation bilaterally with equal chest rise  Abdomen: Soft, nontender, nondistended, + bowel sounds  Extremities: warm dry without cyanosis clubbing or edema  Neuro: AAOx3, no focal deficits  Skin: Without rashes exudates or nodules  Psych: Normal affect and demeanor with intact judgement and insight  Data Review   Micro Results No results found for this or any previous visit (from the past 240 hour(s)).  Radiology Reports Dg Chest 2 View  03/06/2014   CLINICAL DATA:  Syncope and cold sweats  EXAM: CHEST  2 VIEW  COMPARISON:  None.  FINDINGS: The heart size and mediastinal contours are within normal limits. Atherosclerotic disease involves the thoracic aorta. Both lungs are clear. The visualized skeletal structures are unremarkable.  IMPRESSION: 1. No acute cardiopulmonary abnormalities. 2. Atherosclerosis   Electronically Signed   By: Kerby Moors M.D.   On: 03/06/2014 13:27    CBC  Recent Labs Lab 03/06/14 1230 03/07/14 0624 03/08/14 0450 03/09/14 0630  WBC 8.5 5.1 3.9* 4.6  HGB 8.8* 7.8* 8.1* 12.3  HCT 26.4* 23.7* 24.7* 36.2  PLT 302 264 265 203  MCV 94.6 94.0 94.3 90.5  MCH 31.5 31.0 30.9 30.8  MCHC 33.3 32.9 32.8 34.0  RDW 14.3 14.4 14.2 14.8    Chemistries   Recent Labs Lab 03/06/14 1230 03/07/14 0624 03/08/14 0450 03/09/14 0630  NA 139 143 144 142  K 5.2 3.9 3.9 3.8  CL 102 107 111 108  CO2 23 23 22 21   GLUCOSE 130* 99 95 93  BUN 26* 16 13 9   CREATININE 1.59* 1.27* 1.20* 1.15*  CALCIUM 9.7 9.0 8.9 9.4    ------------------------------------------------------------------------------------------------------------------ estimated creatinine clearance is 31.4 ml/min (by C-G formula based on Cr of 1.15). ------------------------------------------------------------------------------------------------------------------ No results found for this basename: HGBA1C,  in the last 72 hours ------------------------------------------------------------------------------------------------------------------ No results found for this basename: CHOL, HDL, LDLCALC, TRIG, CHOLHDL, LDLDIRECT,  in the last 72 hours ------------------------------------------------------------------------------------------------------------------  Recent Labs  03/07/14 1040  TSH 1.280   ------------------------------------------------------------------------------------------------------------------  Recent Labs  03/06/14  1938  VITAMINB12 853  FOLATE >20.0  FERRITIN 40  TIBC 447  IRON 24*  RETICCTPCT 4.6*    Coagulation profile No results found for this basename: INR, PROTIME,  in the last 168 hours  No results found for this basename: DDIMER,  in the last 72 hours  Cardiac Enzymes  Recent Labs Lab 03/06/14 1230  TROPONINI <0.30   ------------------------------------------------------------------------------------------------------------------ No components found with this basename: POCBNP,     [name] D.O. on 03/09/2014 at 7:38 AM  Between 7am to 7pm - Pager - [phone]  After 7pm go to [email or web address] - password [email or web address]  And look for the night coverage person covering for me after hours  [facility] Office  [phone]

## 2014-03-10 ENCOUNTER — Ambulatory Visit: Payer: PRIVATE HEALTH INSURANCE

## 2014-03-10 ENCOUNTER — Other Ambulatory Visit: Payer: PRIVATE HEALTH INSURANCE

## 2014-03-10 DIAGNOSIS — R55 Syncope and collapse: Secondary | ICD-10-CM | POA: Diagnosis not present

## 2014-03-10 LAB — CBC
HEMATOCRIT: 37.8 % (ref 36.0–46.0)
Hemoglobin: 12.9 g/dL (ref 12.0–15.0)
MCH: 30.8 pg (ref 26.0–34.0)
MCHC: 34.1 g/dL (ref 30.0–36.0)
MCV: 90.2 fL (ref 78.0–100.0)
Platelets: 239 10*3/uL (ref 150–400)
RBC: 4.19 MIL/uL (ref 3.87–5.11)
RDW: 15 % (ref 11.5–15.5)
WBC: 6.6 10*3/uL (ref 4.0–10.5)

## 2014-03-10 LAB — BASIC METABOLIC PANEL
BUN: 10 mg/dL (ref 6–23)
CO2: 19 mEq/L (ref 19–32)
Calcium: 9.6 mg/dL (ref 8.4–10.5)
Chloride: 107 mEq/L (ref 96–112)
Creatinine, Ser: 1.2 mg/dL — ABNORMAL HIGH (ref 0.50–1.10)
GFR calc Af Amer: 48 mL/min — ABNORMAL LOW (ref >90)
GFR, EST NON AFRICAN AMERICAN: 42 mL/min — AB (ref >90)
GLUCOSE: 93 mg/dL (ref 70–99)
Potassium: 3.9 mEq/L (ref 3.7–5.3)
Sodium: 142 mEq/L (ref 137–147)

## 2014-03-10 MED ORDER — AMLODIPINE-VALSARTAN-HCTZ 5-160-12.5 MG PO TABS: 0.5000 | ORAL_TABLET | Freq: Every day | ORAL | Status: DC

## 2014-03-10 NOTE — Discharge Summary (Signed)
Physician Discharge Summary  [name] ZSW:[id number] DOB: May 23, 1932 DOA: 03/06/2014  PCP: Elyn Peers, MD  Admit date: 03/06/2014 Discharge date: 03/10/2014  Time spent: 45 minutes  Recommendations for Outpatient Follow-up:  Patient will be discharged to home. She is to followup with her primary care physician within one week of discharge. Patient should discuss her blood pressure management with her primary care physician. She should continue taking all her medications as prescribed.  Discharge Diagnoses:  Principal Problem:   Near syncope Active Problems:   Acute on chronic blood loss anemia   Hypertension   Breast cancer   Anemia   CKD (chronic kidney disease), stage III   Right flank pain  Discharge Condition: Stable  Diet recommendation: Heart healthy  Filed Weights   03/06/14 1208  Weight: 58.968 kg (130 lb)    History of present illness:  80 y.o. female with past medical history of CKD stage 2-3, history of GI bleed secondary to AVMs and breast cancer. Patient came into the hospital because of near syncope. Patient said earlier in the day PTA she was in the toilet bowl trying to have BM when she felt lightheaded, she did not lose her consciousness, she did not fall. She managed to go back to the bed but she started to feel clammy and sweaty. She had the same exact episode in January of this year. Denies any chest pain, shortness of breath, any prodrome type of symptoms prior to the episode. She mentioned she had some runny nose and cough which resolved now which is likely secondary to seasonal allergies.  In the ED she was found to have hemoglobin of 8.8, on 4/30 her hemoglobin was 11.3. Creatinine 1.59, stool Hemoccult is negative. Patient used to get Aranesp shots, she didn't have it for the past 2 months because of hemoglobin >10.  Hospital Course:  Near syncope  -unclear etiology possibly form acute blood loss anemia of unclear etiology  -blood pressure  remains stable -Orthostatic vitals completed and patient responded appropriately -Echocardiogram: EF 55-60%  -Patient received 2u PRBCs during hospital course   Acute on chronic blood loss anemia  -Baseline hemoglobin around 10-11, dopped to 7.8  -Received 2uPRBCs on 5/19  -Currently Hb 12.9 -Will continue Aranesp weekly  -FOBT negative  Hypertension  -Currently controlled  -Exforge HCT (home medication) held during hospital course -Patient should discuss her hypertension management with her primary care physician.  Breast cancer  -outpatient follow up  -continue anastrozole   CKD (chronic kidney disease), stage III  -Resolved  -Patient was given IV fluid and responded well   Right flank area pain  -Improving  -CT scan: No definite acute intra-abdominal or intrapelvic abnormalities  Procedures: Echocardiogram Study Conclusions - Left ventricle: The cavity size was normal. Wall thickness was normal. Systolic function was normal. The estimated ejection fraction was in the range of 55% to 60%. Wall motion was normal; there were no regional wall motion abnormalities. Left ventricular diastolic function parameters were normal. - Aortic valve: There was mild regurgitation. - Tricuspid valve: There was mild-moderate regurgitation directed centrally.  Consultations: None  Discharge Exam: Filed Vitals:   03/10/14 0538  BP: 138/58  Pulse: 64  Temp: 98.4 F (36.9 C)  Resp: 16   Exam  General: Well developed, well nourished, NAD, appears stated age  HEENT: NCAT, mucous membranes moist.  Neck: Supple, no JVD, no masses  Cardiovascular: S1 S2 auscultated, no rubs, murmurs or gallops. Regular rate and rhythm.  Respiratory: Clear to auscultation bilaterally  with equal chest rise  Abdomen: Soft, nontender, nondistended, + bowel sounds  Extremities: warm dry without cyanosis clubbing or edema  Neuro: AAOx3, no focal deficits  Skin: Without rashes exudates or nodules  Psych:  Normal affect and demeanor with intact judgement and insight  Discharge Instructions      Discharge Instructions   Diet - low sodium heart healthy    Complete by:  As directed      Discharge instructions    Complete by:  As directed   Patient will be discharged to home. She is to followup with her primary care physician within one week of discharge. Patient should discuss her blood pressure management with her primary care physician. She should continue taking all her medications as prescribed.     Increase activity slowly    Complete by:  As directed             Medication List         Amlodipine-Valsartan-HCTZ 5-160-12.5 MG Tabs  Commonly known as:  EXFORGE HCT  Take 0.5 tablets by mouth daily.     anastrozole 1 MG tablet  Commonly known as:  ARIMIDEX  Take 1 tablet (1 mg total) by mouth daily.     calcium carbonate 600 MG Tabs tablet  Commonly known as:  OS-CAL  Take 600 mg by mouth daily.     diclofenac sodium 1 % Gel  Commonly known as:  VOLTAREN  Apply 2 g topically 3 (three) times daily as needed. For arthritis pain     Fish Oil 500 MG Caps  Take 1 capsule by mouth daily.     fluticasone 50 MCG/ACT nasal spray  Commonly known as:  FLONASE  Place 2 sprays into both nostrils daily as needed for allergies or rhinitis.     folic acid 623 MCG tablet  Commonly known as:  FOLVITE  Take 400-800 mcg by mouth daily.     loratadine 10 MG tablet  Commonly known as:  CLARITIN  Take 1 tablet (10 mg total) by mouth daily as needed for allergies or rhinitis.     omeprazole 20 MG capsule  Commonly known as:  PRILOSEC  Take 1 capsule (20 mg total) by mouth daily.     vitamin C 500 MG tablet  Commonly known as:  ASCORBIC ACID  Take 500 mg by mouth daily.     Vitamin D-3 1000 UNITS Caps  Take 1,000 Units by mouth daily.       No Known Allergies Follow-up Information   Follow up with Elyn Peers, MD. Schedule an appointment as soon as possible for a visit in 1  week. [facility] follow up, discuss blood pressure management)    Specialty:  Family Medicine   Contact information:   [address] [place] [address] [phone]        The results of significant diagnostics from this hospitalization (including imaging, microbiology, ancillary and laboratory) are listed below for reference.    Significant Diagnostic Studies: Ct Abdomen Pelvis Wo Contrast  03/09/2014   CLINICAL DATA:  Right flank pain, history hypertension, breast cancer, renal insufficiency  EXAM: CT ABDOMEN AND PELVIS WITHOUT CONTRAST  TECHNIQUE: Multidetector CT imaging of the abdomen and pelvis was performed following the standard protocol without IV contrast. Sagittal and coronal MPR images reconstructed from axial data set. No oral contrast given.  COMPARISON:  11/06/2013, 05/05/2008  FINDINGS: Cylindrical bronchiectasis RIGHT lower lobe.  Dense atherosclerotic calcification aorta and iliac arteries.  Within  limits of a nonenhanced exam no focal abnormalities of the liver, spleen, pancreas, kidneys, or adrenal glands.  Dense calcification seen at the RIGHT ureter proximally, image 40, without hydronephrosis or proximal ureteral dilatation and unchanged since 2009, likely adjacent phlebolith.  No definite ureteral calcification or dilatation.  Bladder unremarkable.  Stomach decompressed, appears thickened, question artifact, unable to exclude wall thickening in the setting.  Bowel loops unremarkable for technique.  No mass, adenopathy, free air or hernia.  Degenerative disc disease changes L4-L5 with diffusely bulging disc.  IMPRESSION: No definite acute intra-abdominal or intrapelvic abnormalities as above.  Mild cylindrical bronchiectasis RIGHT lower lobe.   Electronically Signed   By: Lavonia Dana M.D.   On: 03/09/2014 09:50   Dg Chest 2 View  03/06/2014   CLINICAL DATA:  Syncope and cold sweats  EXAM: CHEST  2 VIEW  COMPARISON:  None.  FINDINGS: The heart size and mediastinal  contours are within normal limits. Atherosclerotic disease involves the thoracic aorta. Both lungs are clear. The visualized skeletal structures are unremarkable.  IMPRESSION: 1. No acute cardiopulmonary abnormalities. 2. Atherosclerosis   Electronically Signed   By: Kerby Moors M.D.   On: 03/06/2014 13:27    Microbiology: No results found for this or any previous visit (from the past 240 hour(s)).   Labs: Basic Metabolic Panel:  Recent Labs Lab 03/06/14 1230 03/07/14 0624 03/08/14 0450 03/09/14 0630 03/10/14 0546  NA 139 143 144 142 142  K 5.2 3.9 3.9 3.8 3.9  CL 102 107 111 108 107  CO2 23 23 22 21 19   GLUCOSE 130* 99 95 93 93  BUN 26* 16 13 9 10   CREATININE 1.59* 1.27* 1.20* 1.15* 1.20*  CALCIUM 9.7 9.0 8.9 9.4 9.6   Liver Function Tests: No results found for this basename: AST, ALT, ALKPHOS, BILITOT, PROT, ALBUMIN,  in the last 168 hours No results found for this basename: LIPASE, AMYLASE,  in the last 168 hours No results found for this basename: AMMONIA,  in the last 168 hours CBC:  Recent Labs Lab 03/06/14 1230 03/07/14 0624 03/08/14 0450 03/09/14 0630 03/10/14 0546  WBC 8.5 5.1 3.9* 4.6 6.6  HGB 8.8* 7.8* 8.1* 12.3 12.9  HCT 26.4* 23.7* 24.7* 36.2 37.8  MCV 94.6 94.0 94.3 90.5 90.2  PLT 302 264 265 203 239   Cardiac Enzymes:  Recent Labs Lab 03/06/14 1230  TROPONINI <0.30   BNP: BNP (last 3 results) No results found for this basename: PROBNP,  in the last 8760 hours CBG: No results found for this basename: GLUCAP,  in the last 168 hours     Signed:  Cristal Ford  [facility] 03/10/2014, 8:45 AM

## 2014-03-10 NOTE — Discharge Instructions (Signed)
Near-Syncope °Near-syncope (commonly known as near fainting) is sudden weakness, dizziness, or feeling like you might pass out. During an episode of near-syncope, you may also develop pale skin, have tunnel vision, or feel sick to your stomach (nauseous). Near-syncope may occur when getting up after sitting or while standing for a long time. It is caused by a sudden decrease in blood flow to the brain. This decrease can result from various causes or triggers, most of which are not serious. However, because near-syncope can sometimes be a sign of something serious, a medical evaluation is required. The specific cause is often not determined. °HOME CARE INSTRUCTIONS  °Monitor your condition for any changes. The following actions may help to alleviate any discomfort you are experiencing: °· Have someone stay with you until you feel stable. °· Lie down right away if you start feeling like you might faint. Breathe deeply and steadily. Wait until all the symptoms have passed. Most of these episodes last only a few minutes. You may feel tired for several hours.   °· Drink enough fluids to keep your urine clear or pale yellow.   °· If you are taking blood pressure or heart medicine, get up slowly when seated or lying down. Take several minutes to sit and then stand. This can reduce dizziness. °· Follow up with your health care provider as directed.  °SEEK IMMEDIATE MEDICAL CARE IF:  °· You have a severe headache.   °· You have unusual pain in the chest, abdomen, or back.   °· You are bleeding from the mouth or rectum, or you have black or tarry stool.   °· You have an irregular or very fast heartbeat.   °· You have repeated fainting or have seizure-like jerking during an episode.   °· You faint when sitting or lying down.   °· You have confusion.   °· You have difficulty walking.   °· You have severe weakness.   °· You have vision problems.   °MAKE SURE YOU:  °· Understand these instructions. °· Will watch your  condition. °· Will get help right away if you are not doing well or get worse. °Document Released: 10/07/2005 Document Revised: 06/09/2013 Document Reviewed: 03/12/2013 °Exit[employer]® Patient Information ©2014 [employer]. ° °

## 2014-03-16 ENCOUNTER — Other Ambulatory Visit: Payer: Self-pay | Admitting: *Deleted

## 2014-03-16 DIAGNOSIS — C50919 Malignant neoplasm of unspecified site of unspecified female breast: Secondary | ICD-10-CM

## 2014-03-16 DIAGNOSIS — D638 Anemia in other chronic diseases classified elsewhere: Secondary | ICD-10-CM

## 2014-03-17 ENCOUNTER — Ambulatory Visit: Payer: PRIVATE HEALTH INSURANCE

## 2014-03-17 ENCOUNTER — Telehealth: Payer: Self-pay | Admitting: *Deleted

## 2014-03-17 ENCOUNTER — Other Ambulatory Visit (HOSPITAL_BASED_OUTPATIENT_CLINIC_OR_DEPARTMENT_OTHER): Payer: PRIVATE HEALTH INSURANCE

## 2014-03-17 DIAGNOSIS — C50919 Malignant neoplasm of unspecified site of unspecified female breast: Secondary | ICD-10-CM

## 2014-03-17 DIAGNOSIS — D638 Anemia in other chronic diseases classified elsewhere: Secondary | ICD-10-CM

## 2014-03-17 LAB — CBC WITH DIFFERENTIAL/PLATELET
BASO%: 0.8 % (ref 0.0–2.0)
BASOS ABS: 0.1 10*3/uL (ref 0.0–0.1)
EOS%: 2.1 % (ref 0.0–7.0)
Eosinophils Absolute: 0.2 10*3/uL (ref 0.0–0.5)
HCT: 40.6 % (ref 34.8–46.6)
HGB: 13.2 g/dL (ref 11.6–15.9)
LYMPH%: 9.5 % — ABNORMAL LOW (ref 14.0–49.7)
MCH: 30.4 pg (ref 25.1–34.0)
MCHC: 32.5 g/dL (ref 31.5–36.0)
MCV: 93.4 fL (ref 79.5–101.0)
MONO#: 0.6 10*3/uL (ref 0.1–0.9)
MONO%: 8 % (ref 0.0–14.0)
NEUT#: 6.2 10*3/uL (ref 1.5–6.5)
NEUT%: 79.6 % — AB (ref 38.4–76.8)
Platelets: 238 10*3/uL (ref 145–400)
RBC: 4.34 10*6/uL (ref 3.70–5.45)
RDW: 14.5 % (ref 11.2–14.5)
WBC: 7.8 10*3/uL (ref 3.9–10.3)
lymph#: 0.7 10*3/uL — ABNORMAL LOW (ref 0.9–3.3)

## 2014-03-17 MED ORDER — DARBEPOETIN ALFA-POLYSORBATE 500 MCG/ML IJ SOLN: 300.0000 ug | Freq: Once | INTRAMUSCULAR | Status: DC

## 2014-03-17 NOTE — Telephone Encounter (Signed)
Patient asked to speak with nurse while here for lab/injection. She was recently in hospital and was given blood transfusions which has her Hgb>13.0. She did not need Aranesp injection today. Discussed with patient that she has a return appt in 34month, however if she feels like she is becoming weak and anemic before then, she knows to call and we will see her for labs before then if needed. Patient understands to seek medical attention for increase in shortness of breath, weakness, chest pain or palpitations.

## 2014-04-08 ENCOUNTER — Ambulatory Visit: Payer: PRIVATE HEALTH INSURANCE

## 2014-04-08 ENCOUNTER — Ambulatory Visit: Payer: PRIVATE HEALTH INSURANCE | Admitting: Oncology

## 2014-04-08 ENCOUNTER — Other Ambulatory Visit: Payer: PRIVATE HEALTH INSURANCE

## 2014-04-11 ENCOUNTER — Other Ambulatory Visit: Payer: Self-pay

## 2014-04-11 DIAGNOSIS — C50912 Malignant neoplasm of unspecified site of left female breast: Secondary | ICD-10-CM

## 2014-04-12 ENCOUNTER — Ambulatory Visit: Payer: PRIVATE HEALTH INSURANCE

## 2014-04-12 ENCOUNTER — Telehealth: Payer: Self-pay | Admitting: Hematology and Oncology

## 2014-04-12 ENCOUNTER — Ambulatory Visit (HOSPITAL_BASED_OUTPATIENT_CLINIC_OR_DEPARTMENT_OTHER): Payer: PRIVATE HEALTH INSURANCE | Admitting: Hematology and Oncology

## 2014-04-12 ENCOUNTER — Other Ambulatory Visit (HOSPITAL_BASED_OUTPATIENT_CLINIC_OR_DEPARTMENT_OTHER): Payer: PRIVATE HEALTH INSURANCE

## 2014-04-12 VITALS — BP 122/64 | HR 67 | Temp 98.2°F | Resp 20 | Ht 62.0 in | Wt 134.4 lb

## 2014-04-12 DIAGNOSIS — D638 Anemia in other chronic diseases classified elsewhere: Secondary | ICD-10-CM

## 2014-04-12 DIAGNOSIS — C50912 Malignant neoplasm of unspecified site of left female breast: Secondary | ICD-10-CM

## 2014-04-12 DIAGNOSIS — D509 Iron deficiency anemia, unspecified: Secondary | ICD-10-CM

## 2014-04-12 DIAGNOSIS — C50119 Malignant neoplasm of central portion of unspecified female breast: Secondary | ICD-10-CM

## 2014-04-12 DIAGNOSIS — M899 Disorder of bone, unspecified: Secondary | ICD-10-CM

## 2014-04-12 DIAGNOSIS — N289 Disorder of kidney and ureter, unspecified: Secondary | ICD-10-CM

## 2014-04-12 DIAGNOSIS — M949 Disorder of cartilage, unspecified: Secondary | ICD-10-CM

## 2014-04-12 LAB — COMPREHENSIVE METABOLIC PANEL (CC13)
ALBUMIN: 3.7 g/dL (ref 3.5–5.0)
ALT: 33 U/L (ref 0–55)
AST: 45 U/L — ABNORMAL HIGH (ref 5–34)
Alkaline Phosphatase: 101 U/L (ref 40–150)
Anion Gap: 8 mEq/L (ref 3–11)
BUN: 26 mg/dL (ref 7.0–26.0)
CO2: 27 mEq/L (ref 22–29)
Calcium: 10.1 mg/dL (ref 8.4–10.4)
Chloride: 99 mEq/L (ref 98–109)
Creatinine: 1.6 mg/dL — ABNORMAL HIGH (ref 0.6–1.1)
Glucose: 80 mg/dl (ref 70–140)
POTASSIUM: 5.2 meq/L — AB (ref 3.5–5.1)
SODIUM: 134 meq/L — AB (ref 136–145)
TOTAL PROTEIN: 7.9 g/dL (ref 6.4–8.3)
Total Bilirubin: 0.36 mg/dL (ref 0.20–1.20)

## 2014-04-12 LAB — CBC WITH DIFFERENTIAL/PLATELET
BASO%: 0.4 % (ref 0.0–2.0)
Basophils Absolute: 0 10*3/uL (ref 0.0–0.1)
EOS%: 2.2 % (ref 0.0–7.0)
Eosinophils Absolute: 0.1 10*3/uL (ref 0.0–0.5)
HCT: 33.1 % — ABNORMAL LOW (ref 34.8–46.6)
HGB: 11.2 g/dL — ABNORMAL LOW (ref 11.6–15.9)
LYMPH%: 20 % (ref 14.0–49.7)
MCH: 30.8 pg (ref 25.1–34.0)
MCHC: 33.8 g/dL (ref 31.5–36.0)
MCV: 90.9 fL (ref 79.5–101.0)
MONO#: 0.8 10*3/uL (ref 0.1–0.9)
MONO%: 15.7 % — AB (ref 0.0–14.0)
NEUT#: 3.1 10*3/uL (ref 1.5–6.5)
NEUT%: 61.7 % (ref 38.4–76.8)
Platelets: 169 10*3/uL (ref 145–400)
RBC: 3.64 10*6/uL — AB (ref 3.70–5.45)
RDW: 13.7 % (ref 11.2–14.5)
WBC: 5 10*3/uL (ref 3.9–10.3)
lymph#: 1 10*3/uL (ref 0.9–3.3)

## 2014-04-12 LAB — IRON AND TIBC CHCC
%SAT: 63 % — AB (ref 21–57)
Iron: 110 ug/dL (ref 41–142)
TIBC: 173 ug/dL — ABNORMAL LOW (ref 236–444)
UIBC: 63 ug/dL — ABNORMAL LOW (ref 120–384)

## 2014-04-12 LAB — FERRITIN CHCC: FERRITIN: 157 ng/mL (ref 9–269)

## 2014-04-12 MED ORDER — DARBEPOETIN ALFA-POLYSORBATE 500 MCG/ML IJ SOLN: 300.0000 ug | Freq: Once | INTRAMUSCULAR | Status: DC

## 2014-04-12 NOTE — Telephone Encounter (Signed)
per pof to sch pt labs mthly-appt in 19mths/Gudena-gave pt copy of sch

## 2014-04-14 ENCOUNTER — Ambulatory Visit: Payer: PRIVATE HEALTH INSURANCE | Admitting: Oncology

## 2014-04-14 ENCOUNTER — Ambulatory Visit: Payer: PRIVATE HEALTH INSURANCE

## 2014-04-14 ENCOUNTER — Other Ambulatory Visit: Payer: PRIVATE HEALTH INSURANCE

## 2014-04-16 ENCOUNTER — Encounter (HOSPITAL_COMMUNITY): Payer: Self-pay | Admitting: Emergency Medicine

## 2014-04-16 ENCOUNTER — Emergency Department (HOSPITAL_COMMUNITY): Payer: PRIVATE HEALTH INSURANCE

## 2014-04-16 ENCOUNTER — Inpatient Hospital Stay (HOSPITAL_COMMUNITY)
Admission: EM | Admit: 2014-04-16 | Discharge: 2014-04-19 | DRG: 378 | Disposition: A | Discharge status: Home / Self Care | Payer: PRIVATE HEALTH INSURANCE | Attending: Internal Medicine | Admitting: Internal Medicine

## 2014-04-16 DIAGNOSIS — Z7289 Other problems related to lifestyle: Secondary | ICD-10-CM

## 2014-04-16 DIAGNOSIS — Z79899 Other long term (current) drug therapy: Secondary | ICD-10-CM | POA: Diagnosis not present

## 2014-04-16 DIAGNOSIS — D638 Anemia in other chronic diseases classified elsewhere: Secondary | ICD-10-CM

## 2014-04-16 DIAGNOSIS — D649 Anemia, unspecified: Secondary | ICD-10-CM

## 2014-04-16 DIAGNOSIS — N183 Chronic kidney disease, stage 3 unspecified: Secondary | ICD-10-CM | POA: Diagnosis present

## 2014-04-16 DIAGNOSIS — K921 Melena: Secondary | ICD-10-CM | POA: Diagnosis present

## 2014-04-16 DIAGNOSIS — Z87891 Personal history of nicotine dependence: Secondary | ICD-10-CM

## 2014-04-16 DIAGNOSIS — Z853 Personal history of malignant neoplasm of breast: Secondary | ICD-10-CM

## 2014-04-16 DIAGNOSIS — Z923 Personal history of irradiation: Secondary | ICD-10-CM | POA: Diagnosis not present

## 2014-04-16 DIAGNOSIS — K5521 Angiodysplasia of colon with hemorrhage: Secondary | ICD-10-CM | POA: Diagnosis present

## 2014-04-16 DIAGNOSIS — D62 Acute posthemorrhagic anemia: Secondary | ICD-10-CM | POA: Diagnosis present

## 2014-04-16 DIAGNOSIS — F411 Generalized anxiety disorder: Secondary | ICD-10-CM | POA: Diagnosis present

## 2014-04-16 DIAGNOSIS — I10 Essential (primary) hypertension: Secondary | ICD-10-CM | POA: Diagnosis present

## 2014-04-16 DIAGNOSIS — I129 Hypertensive chronic kidney disease with stage 1 through stage 4 chronic kidney disease, or unspecified chronic kidney disease: Secondary | ICD-10-CM | POA: Diagnosis present

## 2014-04-16 DIAGNOSIS — Z789 Other specified health status: Secondary | ICD-10-CM

## 2014-04-16 DIAGNOSIS — Z7982 Long term (current) use of aspirin: Secondary | ICD-10-CM | POA: Diagnosis not present

## 2014-04-16 DIAGNOSIS — D500 Iron deficiency anemia secondary to blood loss (chronic): Secondary | ICD-10-CM | POA: Diagnosis present

## 2014-04-16 DIAGNOSIS — F419 Anxiety disorder, unspecified: Secondary | ICD-10-CM

## 2014-04-16 LAB — COMPREHENSIVE METABOLIC PANEL
ALT: 24 U/L (ref 0–35)
AST: 34 U/L (ref 0–37)
Albumin: 3.7 g/dL (ref 3.5–5.2)
Alkaline Phosphatase: 92 U/L (ref 39–117)
BUN: 22 mg/dL (ref 6–23)
CO2: 26 mEq/L (ref 19–32)
Calcium: 9.9 mg/dL (ref 8.4–10.5)
Chloride: 96 mEq/L (ref 96–112)
Creatinine, Ser: 1.45 mg/dL — ABNORMAL HIGH (ref 0.50–1.10)
GFR calc Af Amer: 38 mL/min — ABNORMAL LOW (ref >90)
GFR calc non Af Amer: 33 mL/min — ABNORMAL LOW (ref >90)
Glucose, Bld: 110 mg/dL — ABNORMAL HIGH (ref 70–99)
Potassium: 5.1 mEq/L (ref 3.7–5.3)
Sodium: 138 mEq/L (ref 137–147)
Total Bilirubin: <0.2 mg/dL — ABNORMAL LOW (ref 0.3–1.2)
Total Protein: 8 g/dL (ref 6.0–8.3)

## 2014-04-16 LAB — CBC
HCT: 29 % — ABNORMAL LOW (ref 36.0–46.0)
HCT: 27.9 % — ABNORMAL LOW (ref 36.0–46.0)
HEMOGLOBIN: 9.3 g/dL — AB (ref 12.0–15.0)
Hemoglobin: 9.5 g/dL — ABNORMAL LOW (ref 12.0–15.0)
MCH: 30.6 pg (ref 26.0–34.0)
MCH: 30.9 pg (ref 26.0–34.0)
MCHC: 32.8 g/dL (ref 30.0–36.0)
MCHC: 33.3 g/dL (ref 30.0–36.0)
MCV: 93.5 fL (ref 78.0–100.0)
MCV: 92.7 fL (ref 78.0–100.0)
Platelets: 189 10*3/uL (ref 150–400)
Platelets: 201 10*3/uL (ref 150–400)
RBC: 3.1 MIL/uL — ABNORMAL LOW (ref 3.87–5.11)
RBC: 3.01 MIL/uL — ABNORMAL LOW (ref 3.87–5.11)
RDW: 14.2 % (ref 11.5–15.5)
RDW: 14.3 % (ref 11.5–15.5)
WBC: 7.1 10*3/uL (ref 4.0–10.5)
WBC: 8.1 10*3/uL (ref 4.0–10.5)

## 2014-04-16 LAB — PREPARE RBC (CROSSMATCH)

## 2014-04-16 LAB — TROPONIN I: Troponin I: <0.3 ng/mL (ref <0.30)

## 2014-04-16 MED ORDER — ONDANSETRON HCL 4 MG PO TABS: 4.0000 mg | ORAL_TABLET | Freq: Four times a day (QID) | ORAL | Status: DC | PRN

## 2014-04-16 MED ORDER — FLUTICASONE PROPIONATE 50 MCG/ACT NA SUSP: 2.0000 | Freq: Every day | NASAL | Status: DC | PRN

## 2014-04-16 MED ORDER — SODIUM CHLORIDE 0.9 % IV SOLN
INTRAVENOUS | Status: DC
  Administered 2014-04-16: 17:00:00 via INTRAVENOUS
  Administered 2014-04-17 – 2014-04-18 (×2): 1 mL via INTRAVENOUS
  Administered 2014-04-19: 06:00:00 via INTRAVENOUS

## 2014-04-16 MED ORDER — ONDANSETRON HCL 4 MG/2ML IJ SOLN: 4.0000 mg | Freq: Four times a day (QID) | INTRAMUSCULAR | Status: DC | PRN

## 2014-04-16 MED ORDER — LORATADINE 10 MG PO TABS
10.0000 mg | ORAL_TABLET | Freq: Every day | ORAL | Status: DC | PRN
  Filled 2014-04-16: qty 1

## 2014-04-16 MED ORDER — ANASTROZOLE 1 MG PO TABS
1.0000 mg | ORAL_TABLET | Freq: Every day | ORAL | Status: DC
  Administered 2014-04-17 – 2014-04-19 (×3): 1 mg via ORAL
  Filled 2014-04-16 (×3): qty 1

## 2014-04-16 MED ORDER — VITAMIN C 500 MG PO TABS
500.0000 mg | ORAL_TABLET | Freq: Every day | ORAL | Status: DC
  Administered 2014-04-17 – 2014-04-19 (×3): 500 mg via ORAL
  Filled 2014-04-16 (×3): qty 1

## 2014-04-16 MED ORDER — ACETAMINOPHEN 650 MG RE SUPP: 650.0000 mg | Freq: Four times a day (QID) | RECTAL | Status: DC | PRN

## 2014-04-16 MED ORDER — CALCIUM CARBONATE 1250 (500 CA) MG PO TABS
1.0000 | ORAL_TABLET | Freq: Every day | ORAL | Status: DC
  Administered 2014-04-17 – 2014-04-19 (×3): 500 mg via ORAL
  Filled 2014-04-16 (×5): qty 1

## 2014-04-16 MED ORDER — SODIUM CHLORIDE 0.9 % IV BOLUS (SEPSIS): 500.0000 mL | Freq: Once | INTRAVENOUS | Status: DC

## 2014-04-16 MED ORDER — ACETAMINOPHEN 325 MG PO TABS: 650.0000 mg | ORAL_TABLET | Freq: Four times a day (QID) | ORAL | Status: DC | PRN

## 2014-04-16 MED ORDER — PANTOPRAZOLE SODIUM 40 MG IV SOLR
40.0000 mg | Freq: Two times a day (BID) | INTRAVENOUS | Status: DC
  Administered 2014-04-16 – 2014-04-19 (×6): 40 mg via INTRAVENOUS
  Filled 2014-04-16 (×9): qty 40

## 2014-04-16 MED ORDER — CALCIUM CARBONATE 600 MG PO TABS: 600.0000 mg | ORAL_TABLET | Freq: Every day | ORAL | Status: DC

## 2014-04-16 NOTE — ED Notes (Addendum)
Pt states "i just feel tired and don't have any energy". Also presents with a productive cough which is yellow greenish. The fatigue has been since Wednesday. Pt states she is normally up and active but states she is anemic. Pt also states her stool has been black all this week and saw some blood on the tissue.

## 2014-04-16 NOTE — ED Notes (Signed)
IV team returned page.  

## 2014-04-16 NOTE — H&P (Signed)
[facility] History and Physical  [name] BZM:[id number] DOB: 12/10/1931 DOA: 04/16/2014  Referring physician: Dr. Doristine Locks PCP: Elyn Peers, MD   Chief Complaint: melena  HPI: [name] is a 80 y.o. female  Past medical history of AVMs a colonoscopy 2 years prior to admission, she also had an EGD that showed esophagitis on aspirin that comes in for melanotic stools and shortness of breath. She relates she started having 1 or 2 bowel movements 3 days prior to admission are melanotic. On the day of admission she started getting short of breath and dizzy with generalized weakness. She has on her medication her mother's home she has not been taking.  In the ED: A CBC was done that shows a hemoglobin of 9.5 (a month ago was 13.2).   Review of Systems:  Constitutional:  No weight loss, night sweats, Fevers, chills, fatigue.  HEENT:  No headaches, Difficulty swallowing,Tooth/dental problems,Sore throat,  No sneezing, itching, ear ache, nasal congestion, post nasal drip,  Cardio-vascular:  No chest pain, Orthopnea, PND, swelling in lower extremities, anasarca, dizziness, palpitations  Resp:  No shortness of breath with exertion or at rest. No excess mucus, no productive cough, No non-productive cough, No coughing up of blood.No change in color of mucus.No wheezing.No chest wall deformity  Skin:  no rash or lesions.  GU:  no dysuria, change in color of urine, no urgency or frequency. No flank pain.  Musculoskeletal:  No joint pain or swelling. No decreased range of motion. No back pain.  Psych:  No change in mood or affect. No depression or anxiety. No memory loss.   Past Medical History  Diagnosis Date  . Anemia of chronic disease   . Anxiety   . Hypertension   . Hepatitis   . Lipid disorder   . Wears glasses   . Renal insufficiency   . Wears dentures     top  . Breast cancer 08/28/12    IDC left breast bx=invasive ductal ca,ER/PR=+  . Breast cancer  09/01/2012  . Radiation 11/12/12-12/15/11    Left Breast/50 Gy   Past Surgical History  Procedure Laterality Date  . Abdominal hysterectomy       approx 20 years ago  . Appendectomy      20 years ago  . Foot surgery  1992    bone spurs both feet  . Colonoscopy  2009?  [name] Partial mastectomy with needle localization and axillary sentinel lymph node bx  10/08/2012    Procedure: PARTIAL MASTECTOMY WITH NEEDLE LOCALIZATION AND AXILLARY SENTINEL LYMPH NODE BX;  Surgeon: Adin Hector, MD;  Location: [facility];  Service: General;  Laterality: Left;  Left partial mastectomy with Needle localization and Sentinel lymph node biospy  . Enteroscopy N/A 11/08/2013    Procedure: ENTEROSCOPY;  Surgeon: Beryle Beams, MD;  Location: [facility];  Service: Endoscopy;  Laterality: N/A;   Social History:  reports that she quit smoking about 6 years ago. Her smoking use included Cigarettes. She smoked 1.00 pack per day. She has never used smokeless tobacco. She reports that she drinks about .6 ounces of alcohol per week. She reports that she does not use illicit drugs.  No Known Allergies  Family History  Problem Relation Age of Onset  . Cancer Father   . Prostate cancer Father   . Breast cancer Cousin   . Tuberculosis Mother      Prior to Admission medications   Medication Sig Start Date End Date Taking? Authorizing  Provider  Amlodipine-Valsartan-HCTZ (EXFORGE HCT) 5-160-12.5 MG TABS Take 1 tablet by mouth daily.   Yes Historical Provider, MD  anastrozole (ARIMIDEX) 1 MG tablet Take 1 tablet (1 mg total) by mouth daily. 04/15/13  Yes Deatra Robinson, MD  aspirin EC 81 MG tablet Take 81 mg by mouth every 6 (six) hours as needed for mild pain.   Yes Historical Provider, MD  calcium carbonate (OS-CAL) 600 MG TABS Take 600 mg by mouth daily.   Yes Historical Provider, MD  Cholecalciferol (VITAMIN D-3) 1000 UNITS CAPS Take 1,000 Units by mouth daily.   Yes Historical Provider, MD    diclofenac sodium (VOLTAREN) 1 % GEL Apply 2 g topically 3 (three) times daily as needed. For arthritis pain   Yes Historical Provider, MD  fluticasone (FLONASE) 50 MCG/ACT nasal spray Place 2 sprays into both nostrils daily as needed for allergies or rhinitis. 11/08/13  Yes Ripudeep Krystal Eaton, MD  folic acid (FOLVITE) 893 MCG tablet Take 400-800 mcg by mouth daily.   Yes Historical Provider, MD  loratadine (CLARITIN) 10 MG tablet Take 1 tablet (10 mg total) by mouth daily as needed for allergies or rhinitis. 11/08/13  Yes Ripudeep Krystal Eaton, MD  Omega-3 Fatty Acids (FISH OIL) 500 MG CAPS Take 1 capsule by mouth daily.   Yes Historical Provider, MD  omeprazole (PRILOSEC) 20 MG capsule Take 1 capsule (20 mg total) by mouth daily. 11/08/13  Yes Ripudeep Krystal Eaton, MD  vitamin C (ASCORBIC ACID) 500 MG tablet Take 500 mg by mouth daily.   Yes Historical Provider, MD   Physical Exam: Filed Vitals:   04/16/14 1600  BP: 118/43  Pulse:   Temp: 98.6 F (37 C)  Resp: 16    BP 118/43  Pulse 71  Temp(Src) 98.6 F (37 C) (Oral)  Resp 16  SpO2 100%  General:  Appears calm and comfortable Eyes: PERRL, normal lids, irises & conjunctiva ENT: grossly normal hearing, lips & tongue Neck: no LAD, masses or thyromegaly Cardiovascular: RRR, no m/r/g. No LE edema. Telemetry: SR, no arrhythmias  Respiratory: CTA bilaterally, no w/r/r. Normal respiratory effort. Abdomen: soft, nontender nondistended Skin: no rash or induration seen on limited exam Musculoskeletal: grossly normal tone BUE/BLE Psychiatric: grossly normal mood and affect, speech fluent and appropriate Neurologic: grossly non-focal.          Labs on Admission:  Basic Metabolic Panel:  Recent Labs Lab 04/12/14 1051 04/16/14 1244  NA 134* 138  K 5.2* 5.1  CL  --  96  CO2 27 26  GLUCOSE 80 110*  BUN 26.0 22  CREATININE 1.6* 1.45*  CALCIUM 10.1 9.9   Liver Function Tests:  Recent Labs Lab 04/12/14 1051 04/16/14 1244  AST 45* 34  ALT  33 24  ALKPHOS 101 92  BILITOT 0.36 <0.2*  PROT 7.9 8.0  ALBUMIN 3.7 3.7   No results found for this basename: LIPASE, AMYLASE,  in the last 168 hours No results found for this basename: AMMONIA,  in the last 168 hours CBC:  Recent Labs Lab 04/12/14 1051 04/16/14 1244  WBC 5.0 7.1  NEUTROABS 3.1  --   HGB 11.2* 9.5*  HCT 33.1* 29.0*  MCV 90.9 93.5  PLT 169 189   Cardiac Enzymes:  Recent Labs Lab 04/16/14 1244  TROPONINI <0.30    BNP (last 3 results) No results found for this basename: PROBNP,  in the last 8760 hours CBG: No results found for this basename: GLUCAP,  in the last  168 hours  Radiological Exams on Admission: Dg Chest 2 View  04/16/2014   CLINICAL DATA:  Productive cough for 5 days.  Short of breath.  EXAM: CHEST  2 VIEW  COMPARISON:  CT 03/08/2014. Chest radiographs dating back to 06/25/2008.  FINDINGS: Emphysema is present. Aortic atherosclerosis. Surgical clips in the LEFT chest, likely related to prior breast surgery. Atelectasis is present of the LEFT hemidiaphragm. There is no airspace disease. No pleural effusion. On the lateral view, there is enlargement of the retrosternal clear space. Stable density over the RIGHT first rib end represents calcified costochondral cartilage. Bilateral pleural apical scarring noted. Cardiopericardial silhouette within normal limits.  IMPRESSION: Emphysema without acute cardiopulmonary disease.   Electronically Signed   By: Dereck Ligas M.D.   On: 04/16/2014 14:29    EKG: Independently reviewed. Sinus rhythm  Assessment/Plan Acute blood loss anemia/Melena/ Blood loss anemia - Start her on IV PPI, give her clear liquid diet n.p.o. after midnight. MCV is within normal limits with some of the chronic loss. - Bolus  normal saline transfuse 1 unit of packed red blood cells check a CBC cultures fusion. - I have consulted gastroenterology she will probably have a procedure tomorrow morning. - Hold aspirin Tylenol for fever  or pain.   Hypertension: - Blood pressure within normal I will hold her ARB-II and diuretic.   Code Status: presumed full Family Communication: none Disposition Plan: inpatient  Time spent: 85 minutes  Charlynne Cousins [facility] Pager [phone]  **Disclaimer: This note may have been dictated with voice recognition software. Similar sounding words can inadvertently be transcribed and this note may contain transcription errors which may not have been corrected upon publication of note.**

## 2014-04-16 NOTE — Progress Notes (Signed)
Blood transfusion completed without having  any adverse reaction. VSS throughout transfusion.

## 2014-04-16 NOTE — ED Notes (Signed)
IV team paged again

## 2014-04-16 NOTE — Progress Notes (Signed)
OFFICE PROGRESS NOTE  CC Dr. Lucianne Lei  Chief complaint: Followup visit for anemia and breast cancer  DIAGNOSIS: Anemia of chronic disease, renal insufficiency and iron deficiency, Breast cancer.  PRIOR THERAPY: From original intake note:  #1 Aranesp injections every 4 weeks and iron as indicated.   #2 mammogram and ultrasound guided core biopsy of the left breast. Prognostic markers showed invasive ductal carcinoma grade 1 or 2 ER positive PR positive HER-2/neu negative Ki-67 around 14%.  #3 Left partial mastectomy and sentinel node biopsy on 10/08/12, 2.1cm invasive ductal carcinoma, all three sentinel nodes were negatve.  Posterior margin had to be re-excised.    #4 Radiation therapy from 1/14 to 12/14/12  #5 Arimidex daily beginning 12/28/12  CURRENT THERAPY: Aranesp injections every 4 weeks and iron as indicated. Arimidex daily  INTERVAL HISTORY:  [name] is here for followup visit for anemia and breast cancer. Her last Aranesp injection was on 03/14/2014. Her hemoglobin today is 11.2 with hct of of 33.1%.  She says overall she is feeling better. She is tolerating  Arimidex very well without any problems. She denies any shortness of breath, chest pain, palpitations, blood in the stool or blood in the urine. Denies any hot flashes, arthralgias or myalgias . Denies any headaches, fever, dizziness, or blurred vision. she denies any history of unintentional weight loss or decrease in appetite. Does not complain of any  hot flashes or vaginal dryness.  Review of systems: A 10 point review of systems is been assessed and pertinent symptoms as mentioned in interval history  ALLERGIES:  None No current facility-administered medications for this visit.   Current Outpatient Prescriptions  Medication Sig Dispense Refill  . anastrozole (ARIMIDEX) 1 MG tablet Take 1 tablet (1 mg total) by mouth daily.  30 tablet  12  . calcium carbonate (OS-CAL) 600 MG TABS Take 600 mg by mouth  daily.      . Cholecalciferol (VITAMIN D-3) 1000 UNITS CAPS Take 1,000 Units by mouth daily.      . diclofenac sodium (VOLTAREN) 1 % GEL Apply 2 g topically 3 (three) times daily as needed. For arthritis pain      . fluticasone (FLONASE) 50 MCG/ACT nasal spray Place 2 sprays into both nostrils daily as needed for allergies or rhinitis.  16 g  2  . folic acid (FOLVITE) 480 MCG tablet Take 400-800 mcg by mouth daily.      [name] loratadine (CLARITIN) 10 MG tablet Take 1 tablet (10 mg total) by mouth daily as needed for allergies or rhinitis.  30 tablet  3  . Omega-3 Fatty Acids (FISH OIL) 500 MG CAPS Take 1 capsule by mouth daily.      [name] omeprazole (PRILOSEC) 20 MG capsule Take 1 capsule (20 mg total) by mouth daily.  30 capsule  3  . vitamin C (ASCORBIC ACID) 500 MG tablet Take 500 mg by mouth daily.      . Amlodipine-Valsartan-HCTZ (EXFORGE HCT) 5-160-12.5 MG TABS Take 1 tablet by mouth daily.      [name] aspirin EC 81 MG tablet Take 81 mg by mouth every 6 (six) hours as needed for mild pain.       Facility-Administered Medications Ordered in Other Visits  Medication Dose Route Frequency Donnajean Chesnut Last Rate Last Dose  . pantoprazole (PROTONIX) injection 40 mg  40 mg Intravenous Q12H Charlynne Cousins, MD      . sodium chloride 0.9 % bolus 500 mL  500 mL Intravenous Once Virgel Manifold,  MD        Health Maintenance  Mammogram: 06/2012 Colonoscopy: 2009 Bone Density Scan: 2 years ago Pap Smear: s/p TAH/BSO Eye Exam: 12/2011 Vitamin D Level: pending Lipid Panel: 06/2012  ROS:  General: fatigue (-), night sweats (-), fever (-), pain (-) Lymph: palpable nodes (-) HEENT: vision changes (-), mucositis (-), gum bleeding (-), epistaxis (-) Cardiovascular: chest pain (-), palpitations (-) Pulmonary: shortness of breath (-), dyspnea on exertion (-), cough (-), hemoptysis (-) GI:  Early satiety (-), melena (-), dysphagia (-), nausea/vomiting (-), diarrhea (-) GU: dysuria (-), hematuria (-), incontinence  (-) Musculoskeletal: joint swelling (-), joint pain (-), back pain (-) Neuro: weakness (-), numbness (-), headache (-), confusion (-) Skin: Rash (-), lesions (-), dryness (-) Psych: depression (-), suicidal/homicidal ideation (-), feeling of hopelessness (-)   PHYSICAL EXAMINATION:  BP 122/64  Pulse 67  Temp(Src) 98.2 F (36.8 C) (Oral)  Resp 20  Ht '5\' 2"'  (1.575 m)  Wt 134 lb 6.4 oz (60.963 kg)  BMI 24.58 kg/m2. General: Well developed, well nourished, in no acute distress.  EENT: No ocular or oral lesions. No stomatitis.  Respiratory: Lungs are clear to auscultation bilaterally with normal respiratory movement and no accessory muscle use. Cardiac: No murmur, rub or tachycardia. No upper or lower extremity edema.  GI: Abdomen is soft, no palpable hepatosplenomegaly. No fluid wave. No tenderness. Musculoskeletal: No kyphosis, no tenderness over the spine, ribs or hips. Lymph: No cervical, infraclavicular, axillary or inguinal adenopathy. Neuro: No focal neurological deficits. Psych: Alert and oriented X 3, appropriate mood and affect.  Breast: left breast with radiation changes, no erythema, lumpectomy site well healed with no nodularity.  Right breast: no nodularity or masses. No bilateral axillary lymphadenopathy ECOG PERFORMANCE STATUS: 0 - Asymptomatic    LABORATORY DATA: Lab Results  Component Value Date   WBC 7.1 04/16/2014   HGB 9.5* 04/16/2014   HCT 29.0* 04/16/2014   MCV 93.5 04/16/2014   PLT 189 04/16/2014      Chemistry      Component Value Date/Time   NA 138 04/16/2014 1244   NA 134* 04/12/2014 1051   NA 138 04/12/2010 0954   K 5.1 04/16/2014 1244   K 5.2* 04/12/2014 1051   K 4.8* 04/12/2010 0954   CL 96 04/16/2014 1244   CL 105 12/28/2012 1059   CL 101 04/12/2010 0954   CO2 26 04/16/2014 1244   CO2 27 04/12/2014 1051   CO2 26 04/12/2010 0954   BUN 22 04/16/2014 1244   BUN 26.0 04/12/2014 1051   BUN 14 04/12/2010 0954   CREATININE 1.45* 04/16/2014 1244   CREATININE  1.6* 04/12/2014 1051   CREATININE 1.2 04/12/2010 0954      Component Value Date/Time   CALCIUM 9.9 04/16/2014 1244   CALCIUM 10.1 04/12/2014 1051   CALCIUM 9.8 04/12/2010 0954   ALKPHOS 92 04/16/2014 1244   ALKPHOS 101 04/12/2014 1051   ALKPHOS 106* 04/12/2010 0954   AST 34 04/16/2014 1244   AST 45* 04/12/2014 1051   AST 80* 04/12/2010 0954   ALT 24 04/16/2014 1244   ALT 33 04/12/2014 1051   ALT 69* 04/12/2010 0954   BILITOT <0.2* 04/16/2014 1244   BILITOT 0.36 04/12/2014 1051   BILITOT 0.50 04/12/2010 0954    ADDITIONAL INFORMATION: 1. CHROMOGENIC IN-SITU HYBRIDIZATION Interpretation HER-2/NEU BY [facility] - NO AMPLIFICATION OF HER-2 DETECTED. THE RATIO OF HER-2: CEP 17 SIGNALS WAS 1.09. Reference range: Ratio: HER2:CEP17 < 1.8 - gene amplification not observed  Ratio: HER2:CEP 17 1.8-2.2 - equivocal result Ratio: HER2:CEP17 > 2.2 - gene amplification observed Enid Cutter MD Pathologist, Electronic Signature ( Signed 10/22/2012) 1. A sample (block 1A) was sent to [facility] for Oncotype testing. The patient's recurrence score is 18. Those patients who had a recurrence score of 18 had an average rate of distant recurrence of 11%. (JBK:kh 10/23/11) Enid Cutter MD Pathologist, Electronic Signature ( Signed 10/22/2012) FINAL DIAGNOSIS Diagnosis 1. Breast, lumpectomy, Left - INVASIVE DUCTAL CARCINOMA WITH PAPILLARY FEATURES, GRADE I/III, SPANNING 2.1 CM. - INVASIVE CARCINOMA IS FOCALLY 0.1 CM TO THE POSTERIOR MARGIN OF SPECIMEN #1. 1 of 3 FINAL for [name], [name] ([id number]) Diagnosis(continued) - SEE ONCOLOGY TABLE BELOW. 2. Breast, excision, Left posterior margin - BENIGN BREAST PARENCHYMA. - BENIGN SKELETAL MUSCLE. - THERE IS NO EVIDENCE OF MALIGNANCY. - SEE COMMENT. 3. Lymph node, sentinel, biopsy, Left - THERE IS NO EVIDENCE OF CARCINOMA IN 1 OF 1 LYMPH NODE (0/1). 4. Lymph node, sentinel, biopsy, Left - THERE IS NO EVIDENCE OF CARCINOMA IN 1 OF 1 LYMPH NODE (0/1). 5. Lymph  node, sentinel, biopsy, Left - THERE IS NO EVIDENCE OF CARCINOMA IN 1 OF 1 LYMPH NODE (0/1). Microscopic Comment 1. BREAST, INVASIVE TUMOR, WITH LYMPH NODE SAMPLING Specimen, including laterality: Left breast Procedure: Partial mastectomy Grade: I Tubule formation: 2 Nuclear pleomorphism: 2 Mitotic: 1 Tumor size (gross measurement): 2.1 cm Margins: Invasive, distance to closest margin: Greater than 0.2 cm to all margins (including re-excision of posterior margin - specimen #2) Lymphovascular invasion: Not identified Ductal carcinoma in situ: Not identified Lobular neoplasia: Not identified Tumor focality: Unifocal Treatment effect: N/A Extent of tumor: Confined to breast parenchyma Lymph nodes: # examined: 3 Lymph nodes with metastasis: 0 Breast prognostic profile: [phone] Estrogen receptor: 100%, strong staining intensity Progesterone receptor: 13%, moderate staining intensity Her 2 neu: No amplification was detected. The ratio was 1.24. Her 2 neu by [facility] will be repeated on the current case and the results reported separately. Ki-67: 14% Non-neoplastic breast: Healing biopsy site. TNM: pT2, pN0 (JBK:caf 10/12/12) 2. The surgical resection margins of the specimen were inked and microscopically evaluated.  ASSESSMENT: 80 year old female with  #1 History of anemia of chronic disease/renal insufficiency and iron deficiency patient has been receiving Aranesp injections and as needed iron infusions   #2 new diagnosis of breast cancer in the left breast that is ER positive HER-2/neu negative. Status post lumpectomy on 10/08/2012. Final pathology revealed a 2.1 cm invasive ductal carcinoma grade 1 ER PR positive HER-2/neu negative. Final clinical staging and pathologic staging stage II (T2 N0).  #3 patient was begun on radiation therapywhich he completed on 12/14/2012.  #22 December 2012 patient was started on adjuvant  Arimidex 1 mg daily   #5 osteopenia: calcium/vitamin D3  and weight bearing exercises  PLAN:  #1 Her hemoglobin and hematocrit is stable with hemoglobin of 11.2 g/dL and hematocrit of 33.1%. She does not require Aranesp injection today. I have reviewed her iron indices and she does not need any IV iron supplementation at this time. We'll monitor CBC and differential once monthly and she gets aranesp injections as needed.  #2 I have reviewed the bilateral diagnostic mammogram performed in February 2015. It shows no evidence of any malignancy. Continue taking arimidex 1 mg daily  #3 continue calcium with vitamin D supplementation as recommended   #4 Continue aranesp injections every 4 weeks as needed    next follow up visit in 6 months with CBC differential and CMP  All questions were answered. The patient knows to call the clinic with any problems, questions or concerns.  Total time spent: 30 minutes   Wilmon Arms, MD Medical/Oncology [facility] [phone] (Office)  04/16/2014, 3:35 PM

## 2014-04-16 NOTE — ED Provider Notes (Signed)
CSN: [id number]     Arrival date & time 04/16/14  1232 History   First MD Initiated Contact with Patient 04/16/14 1327     Chief Complaint  Patient presents with  . Fatigue  . Melena     (Consider location/radiation/quality/duration/timing/severity/associated sxs/prior Treatment) HPI  Pt presenting with generalized fatigue. Onset about a week ago and progressively worsening. Just has no energy. No fever or chills. No cough. Dyspnea with exertion. Denies acute pain anywhere. Black appearing stool for past week. No gross blood. No blood thinners aside from baby ASA.   Past Medical History  Diagnosis Date  . Anemia of chronic disease   . Anxiety   . Hypertension   . Hepatitis   . Lipid disorder   . Wears glasses   . Renal insufficiency   . Wears dentures     top  . Breast cancer 08/28/12    IDC left breast bx=invasive ductal ca,ER/PR=+  . Breast cancer 09/01/2012  . Radiation 11/12/12-12/15/11    Left Breast/50 Gy   Past Surgical History  Procedure Laterality Date  . Abdominal hysterectomy       approx 20 years ago  . Appendectomy      20 years ago  . Foot surgery  1992    bone spurs both feet  . Colonoscopy  2009?  [name] Partial mastectomy with needle localization and axillary sentinel lymph node bx  10/08/2012    Procedure: PARTIAL MASTECTOMY WITH NEEDLE LOCALIZATION AND AXILLARY SENTINEL LYMPH NODE BX;  Surgeon: Adin Hector, MD;  Location: [place];  Service: General;  Laterality: Left;  Left partial mastectomy with Needle localization and Sentinel lymph node biospy  . Enteroscopy N/A 11/08/2013    Procedure: ENTEROSCOPY;  Surgeon: Beryle Beams, MD;  Location: [place];  Service: Endoscopy;  Laterality: N/A;   Family History  Problem Relation Age of Onset  . Cancer Father   . Breast cancer Cousin   . Tuberculosis Mother   . Prostate cancer Father    History  Substance Use Topics  . Smoking status: Former Smoker -- 1.00 packs/day    Types:  Cigarettes    Quit date: 10/27/2007  . Smokeless tobacco: Never Used     Comment: started smoking age 9  . Alcohol Use: 0.6 oz/week    1 Glasses of wine per week     Comment:  glass wine per week   OB History   Grav Para Term Preterm Abortions TAB SAB Ect Mult Living                 Review of Systems  All systems reviewed and negative, other than as noted in HPI.   Allergies  Review of patient's allergies indicates no known allergies.  Home Medications   Prior to Admission medications   Medication Sig Start Date End Date Taking? Authorizing Jarely Juncaj  Amlodipine-Valsartan-HCTZ (EXFORGE HCT) 5-160-12.5 MG TABS Take 1 tablet by mouth daily.   Yes Historical Ambriel Gorelick, MD  anastrozole (ARIMIDEX) 1 MG tablet Take 1 tablet (1 mg total) by mouth daily. 04/15/13  Yes Deatra Robinson, MD  aspirin EC 81 MG tablet Take 81 mg by mouth every 6 (six) hours as needed for mild pain.   Yes Historical Cloee Dunwoody, MD  calcium carbonate (OS-CAL) 600 MG TABS Take 600 mg by mouth daily.   Yes Historical Shanta Dorvil, MD  Cholecalciferol (VITAMIN D-3) 1000 UNITS CAPS Take 1,000 Units by mouth daily.   Yes Historical Ji Feldner, MD  diclofenac  sodium (VOLTAREN) 1 % GEL Apply 2 g topically 3 (three) times daily as needed. For arthritis pain   Yes Historical Maki Sweetser, MD  fluticasone (FLONASE) 50 MCG/ACT nasal spray Place 2 sprays into both nostrils daily as needed for allergies or rhinitis. 11/08/13  Yes Ripudeep Krystal Eaton, MD  folic acid (FOLVITE) 841 MCG tablet Take 400-800 mcg by mouth daily.   Yes Historical Daytona Retana, MD  loratadine (CLARITIN) 10 MG tablet Take 1 tablet (10 mg total) by mouth daily as needed for allergies or rhinitis. 11/08/13  Yes Ripudeep Krystal Eaton, MD  Omega-3 Fatty Acids (FISH OIL) 500 MG CAPS Take 1 capsule by mouth daily.   Yes Historical Justen Fonda, MD  omeprazole (PRILOSEC) 20 MG capsule Take 1 capsule (20 mg total) by mouth daily. 11/08/13  Yes Ripudeep Krystal Eaton, MD  vitamin C (ASCORBIC ACID) 500 MG  tablet Take 500 mg by mouth daily.   Yes Historical Wonda Goodgame, MD   BP 103/37  Pulse 71  Temp(Src) 98.3 F (36.8 C) (Oral)  Resp 17  SpO2 100% Physical Exam  Nursing note and vitals reviewed. Constitutional: She appears well-developed and well-nourished. No distress.  Laying in bed. Tired appearing, but not toxic.   HENT:  Head: Normocephalic and atraumatic.  Eyes: Conjunctivae are normal. Right eye exhibits no discharge. Left eye exhibits no discharge.  Neck: Neck supple.  Cardiovascular: Normal rate, regular rhythm and normal heart sounds.  Exam reveals no gallop and no friction rub.   No murmur heard. Pulmonary/Chest: Effort normal and breath sounds normal. No respiratory distress.  Abdominal: Soft. She exhibits no distension. There is no tenderness.  Musculoskeletal: She exhibits no edema and no tenderness.  Neurological: She is alert.  Skin: Skin is warm and dry.  Psychiatric: She has a normal mood and affect. Her behavior is normal. Thought content normal.    ED Course  Procedures (including critical care time) Labs Review Labs Reviewed  CBC - Abnormal; Notable for the following:    RBC 3.10 (*)    Hemoglobin 9.5 (*)    HCT 29.0 (*)    All other components within normal limits  COMPREHENSIVE METABOLIC PANEL - Abnormal; Notable for the following:    Glucose, Bld 110 (*)    Creatinine, Ser 1.45 (*)    Total Bilirubin <0.2 (*)    GFR calc non Af Amer 33 (*)    GFR calc Af Amer 38 (*)    All other components within normal limits  TROPONIN I  OCCULT BLOOD X 1 CARD TO LAB, STOOL  TYPE AND SCREEN    Imaging Review Dg Chest 2 View  04/16/2014   CLINICAL DATA:  Productive cough for 5 days.  Short of breath.  EXAM: CHEST  2 VIEW  COMPARISON:  CT 03/08/2014. Chest radiographs dating back to 06/25/2008.  FINDINGS: Emphysema is present. Aortic atherosclerosis. Surgical clips in the LEFT chest, likely related to prior breast surgery. Atelectasis is present of the LEFT  hemidiaphragm. There is no airspace disease. No pleural effusion. On the lateral view, there is enlargement of the retrosternal clear space. Stable density over the RIGHT first rib end represents calcified costochondral cartilage. Bilateral pleural apical scarring noted. Cardiopericardial silhouette within normal limits.  IMPRESSION: Emphysema without acute cardiopulmonary disease.   Electronically Signed   By: Dereck Ligas M.D.   On: 04/16/2014 14:29     EKG Interpretation   Date/Time:  Saturday April 16 2014 14:31:05 EDT Ventricular Rate:  71 PR Interval:  191 QRS  Duration: 88 QT Interval:  420 QTC Calculation: 456 R Axis:   11 Text Interpretation:  Sinus rhythm ED PHYSICIAN INTERPRETATION AVAILABLE  IN [facility] Confirmed by TEST, Record ([id number]) on 04/18/2014 7:47:17  AM      MDM   Final diagnoses:  Melena  Symptomatic anemia    82yF with generalized weakness/fatigue. Melenotic stool. Suspect symptoms from blood loss anemia. Will check EKG and CXR though. Hemoglobin 13.2 one month ago. 11.2 4 days ago. 9.5 today. No blood thinners aside from 81mg  ASA. Denies established GI care. PCP Dr Criss Rosales.   Virgel Manifold, MD 04/20/14 [phone]

## 2014-04-16 NOTE — ED Notes (Signed)
Consult MD at bedside.

## 2014-04-16 NOTE — ED Notes (Signed)
IV attempt X 2 by this RN.  Restricted access : can only stick right arm. IV team paged

## 2014-04-17 DIAGNOSIS — F411 Generalized anxiety disorder: Secondary | ICD-10-CM

## 2014-04-17 DIAGNOSIS — K5521 Angiodysplasia of colon with hemorrhage: Principal | ICD-10-CM

## 2014-04-17 DIAGNOSIS — Z789 Other specified health status: Secondary | ICD-10-CM

## 2014-04-17 LAB — CBC
HCT: 31.4 % — ABNORMAL LOW (ref 36.0–46.0)
HEMATOCRIT: 30 % — AB (ref 36.0–46.0)
HEMOGLOBIN: 10.5 g/dL — AB (ref 12.0–15.0)
Hemoglobin: 10.3 g/dL — ABNORMAL LOW (ref 12.0–15.0)
MCH: 30.6 pg (ref 26.0–34.0)
MCH: 31.6 pg (ref 26.0–34.0)
MCHC: 33.4 g/dL (ref 30.0–36.0)
MCHC: 34.3 g/dL (ref 30.0–36.0)
MCV: 91.5 fL (ref 78.0–100.0)
MCV: 92 fL (ref 78.0–100.0)
PLATELETS: 176 10*3/uL (ref 150–400)
Platelets: 160 10*3/uL (ref 150–400)
RBC: 3.43 MIL/uL — AB (ref 3.87–5.11)
RBC: 3.26 MIL/uL — ABNORMAL LOW (ref 3.87–5.11)
RDW: 13.9 % (ref 11.5–15.5)
RDW: 13.9 % (ref 11.5–15.5)
WBC: 6.2 10*3/uL (ref 4.0–10.5)
WBC: 5.9 10*3/uL (ref 4.0–10.5)

## 2014-04-17 LAB — TYPE AND SCREEN
ABO/RH(D): B POS
Antibody Screen: NEGATIVE
Unit division: 0

## 2014-04-17 NOTE — Consult Note (Signed)
Referring Provider: No ref. provider found Primary Care Physician:  Elyn Peers, MD Primary Gastroenterologist:  Dr.Mann  Reason for Consultation:  Melena,  symptomatic  anemia  HPI: [name] is a 80 y.o. female to Dr. Meriel Pica with history of chronic iron deficiency anemia and known AVMs. Patient was mid to the emergency room yesterday afternoon with complaints of dark blackish stools daily over the past week and now with onset of shortness of breath and dizziness with standing. Patient has had some mild lower abdominal discomfort no nausea or vomiting. No fever or chills. She's been having one to 2 bowel movements daily which is normal for her. Patient is also known to hematology and has had intermittent iron infusions. Her last hemoglobin was checked in May of 2015 was 13.2. On arrival to the emergency room yesterday hemoglobin 9.5. Patient was given 1 unit of blood last evening. She denies any aspirin or NSAID use. She has had 2 bowel movements since admission both black. Patient had colonoscopy in August of 2014 she had 3 polyps removed no diverticulosis or other lesions noted. Patient had undergone EGD and small bowel enteroscopy in January 2015 with Dr.Hung for complaints of melena and was noted to have multiple small gastric AVMs and multiple small AVMs in the duodenum and into the proximal jejunum. All of the AVMs  visible were ablated with APC.   Past Medical History  Diagnosis Date  . Anemia of chronic disease   . Anxiety   . Hypertension   . Hepatitis   . Lipid disorder   . Wears glasses   . Renal insufficiency   . Wears dentures     top  . Breast cancer 08/28/12    IDC left breast bx=invasive ductal ca,ER/PR=+  . Breast cancer 09/01/2012  . Radiation 11/12/12-12/15/11    Left Breast/50 Gy    Past Surgical History  Procedure Laterality Date  . Abdominal hysterectomy       approx 20 years ago  . Appendectomy      20 years ago  . Foot surgery  1992   bone spurs both feet  . Colonoscopy  2009?  [name] Partial mastectomy with needle localization and axillary sentinel lymph node bx  10/08/2012    Procedure: PARTIAL MASTECTOMY WITH NEEDLE LOCALIZATION AND AXILLARY SENTINEL LYMPH NODE BX;  Surgeon: Adin Hector, MD;  [facility];  Service: General;  Laterality: Left;  Left partial mastectomy with Needle localization and Sentinel lymph node biospy  . Enteroscopy N/A 11/08/2013    Procedure: ENTEROSCOPY;  Surgeon: Beryle Beams, MD;  Location: [place];  Service: Endoscopy;  Laterality: N/A;  . Bones spur      removed 20 years ago    Prior to Admission medications   Medication Sig Start Date End Date Taking? Authorizing Provider  Amlodipine-Valsartan-HCTZ (EXFORGE HCT) 5-160-12.5 MG TABS Take 1 tablet by mouth daily.   Yes Historical Provider, MD  anastrozole (ARIMIDEX) 1 MG tablet Take 1 tablet (1 mg total) by mouth daily. 04/15/13  Yes Deatra Robinson, MD  aspirin EC 81 MG tablet Take 81 mg by mouth every 6 (six) hours as needed for mild pain.   Yes Historical Provider, MD  calcium carbonate (OS-CAL) 600 MG TABS Take 600 mg by mouth daily.   Yes Historical Provider, MD  Cholecalciferol (VITAMIN D-3) 1000 UNITS CAPS Take 1,000 Units by mouth daily.   Yes Historical Provider, MD  diclofenac sodium (VOLTAREN) 1 % GEL Apply 2 g  topically 3 (three) times daily as needed. For arthritis pain   Yes Historical Provider, MD  fluticasone (FLONASE) 50 MCG/ACT nasal spray Place 2 sprays into both nostrils daily as needed for allergies or rhinitis. 11/08/13  Yes Ripudeep Krystal Eaton, MD  folic acid (FOLVITE) 259 MCG tablet Take 400-800 mcg by mouth daily.   Yes Historical Provider, MD  loratadine (CLARITIN) 10 MG tablet Take 1 tablet (10 mg total) by mouth daily as needed for allergies or rhinitis. 11/08/13  Yes Ripudeep Krystal Eaton, MD  Omega-3 Fatty Acids (FISH OIL) 500 MG CAPS Take 1 capsule by mouth daily.   Yes Historical Provider, MD    omeprazole (PRILOSEC) 20 MG capsule Take 1 capsule (20 mg total) by mouth daily. 11/08/13  Yes Ripudeep Krystal Eaton, MD  vitamin C (ASCORBIC ACID) 500 MG tablet Take 500 mg by mouth daily.   Yes Historical Provider, MD    Current Facility-Administered Medications  Medication Dose Route Frequency Provider Last Rate Last Dose  . 0.9 %  sodium chloride infusion   Intravenous Continuous Charlynne Cousins, MD 75 mL/hr at 04/17/14 0816 1 mL at 04/17/14 0816  . acetaminophen (TYLENOL) tablet 650 mg  650 mg Oral Q6H PRN Charlynne Cousins, MD       Or  . acetaminophen (TYLENOL) suppository 650 mg  650 mg Rectal Q6H PRN Charlynne Cousins, MD      . anastrozole (ARIMIDEX) tablet 1 mg  1 mg Oral Daily Charlynne Cousins, MD   1 mg at 04/17/14 1000  . calcium carbonate (OS-CAL - dosed in mg of elemental calcium) tablet 500 mg of elemental calcium  1 tablet Oral Q breakfast Charlynne Cousins, MD   500 mg of elemental calcium at 04/17/14 0933  . fluticasone (FLONASE) 50 MCG/ACT nasal spray 2 spray  2 spray Each Nare Daily PRN Charlynne Cousins, MD      . loratadine (CLARITIN) tablet 10 mg  10 mg Oral Daily PRN Charlynne Cousins, MD      . ondansetron [facility]) tablet 4 mg  4 mg Oral Q6H PRN Charlynne Cousins, MD       Or  . ondansetron [name]) injection 4 mg  4 mg Intravenous Q6H PRN Charlynne Cousins, MD      . pantoprazole (PROTONIX) injection 40 mg  40 mg Intravenous Q12H Charlynne Cousins, MD   40 mg at 04/17/14 0942  . sodium chloride 0.9 % bolus 500 mL  500 mL Intravenous Once Virgel Manifold, MD      . vitamin C (ASCORBIC ACID) tablet 500 mg  500 mg Oral Daily Charlynne Cousins, MD   500 mg at 04/17/14 5638    Allergies as of 04/16/2014  . (No Known Allergies)    Family History  Problem Relation Age of Onset  . Cancer Father   . Prostate cancer Father   . Breast cancer Cousin   . Tuberculosis Mother     History   Social History  . Marital Status: Widowed    Spouse Name:  N/A    Number of Children: 3  . Years of Education: N/A   Occupational History  .     Social History Main Topics  . Smoking status: Former Smoker -- 1.00 packs/day    Types: Cigarettes    Quit date: 10/27/2007  . Smokeless tobacco: Never Used     Comment: started smoking age 70  . Alcohol Use: 0.6 oz/week    1  Glasses of wine per week     Comment:  glass wine per week  . Drug Use: No  . Sexual Activity: Not Currently   Other Topics Concern  . Not on file   Social History Narrative   Widowed lives with one of her sons   3 sons and 2 daughters   Menses age 8 or 54   Age 68 with first child   No breast feeding   No HRT    Review of Systems:  Pertinent positive and negative review of systems were noted in the above HPI section.  All other review of systems was otherwise negative. Physical Exam: Vital signs in last 24 hours: Temp:  [97.6 F (36.4 C)-98.6 F (37 C)] 98.3 F (36.8 C) (06/28 0429) Pulse Rate:  [57-81] 63 (06/28 0429) Resp:  [13-20] 16 (06/28 0429) BP: (95-135)/(37-67) 95/62 mmHg (06/28 0429) SpO2:  [93 %-100 %] 95 % (06/28 0429) Weight:  [134 lb 1.6 oz (60.827 kg)] 134 lb 1.6 oz (60.827 kg) (06/27 1700) Last BM Date: 04/17/14 General:   Alert,  Well-developed, well-nourished,AA female  pleasant and cooperative in NAD Head:  Normocephalic and atraumatic. Eyes:  Sclera clear, no icterus.   Conjunctiva pale  Ears:  Normal auditory acuity. Nose:  No deformity, discharge,  or lesions. Mouth:  No deformity or lesions.   Neck:  Supple; no masses or thyromegaly. Lungs:  Clear throughout to auscultation.   No wheezes, crackles, or rhonchi. Heart:  Regular rate and rhythm; no murmurs, clicks, rubs,  or gallops. Abdomen:  Soft,nontender, BS active,nonpalp mass or hsm.   Rectal:  Deferred  Msk:  Symmetrical without gross deformities. . Pulses:  Normal pulses noted. Extremities:  Without clubbing or edema. Neurologic:  Alert and  oriented x4;  grossly normal  neurologically. Skin:  Intact without significant lesions or rashes.. Psych:  Alert and cooperative. Normal mood and affect.  Intake/Output from previous day: 06/27 0701 - 06/28 0700 In: 110 [I.V.:97.5; Blood:12.5] Out: -  Intake/Output this shift:    Lab Results:  Recent Labs  04/16/14 1244 04/16/14 1649 04/17/14 0500  WBC 7.1 8.1 6.2  HGB 9.5* 9.3* 10.5*  HCT 29.0* 27.9* 31.4*  PLT 189 201 160   BMET  Recent Labs  04/16/14 1244  NA 138  K 5.1  CL 96  CO2 26  GLUCOSE 110*  BUN 22  CREATININE 1.45*  CALCIUM 9.9   LFT  Recent Labs  04/16/14 1244  PROT 8.0  ALBUMIN 3.7  AST 34  ALT 24  ALKPHOS 92  BILITOT <0.2*   PT/INR No results found for this basename: LABPROT, INR,  in the last 72 hours Hepatitis Panel No results found for this basename: HEPBSAG, HCVAB, HEPAIGM, HEPBIGM,  in the last 72 hours    MPRESSION:  # 33  80 year old female with recurrent symptomatic anemia and melena Patient has history of gastric duodenal and jejunal AVMs and has had recurrent problems with melena in the past. EGD and small bowel enteroscopy were done in January of 2015 with multiple AVMs ablated. Patient is most likely oozing again from AVMs #2 chronic iron deficiency anemia secondary to above #3 history of colon polyps #4 chronic kidney disease stage III #5 hypertension #6 history of breast cancer    PLAN: #1  transfuse to keep hemoglobin in the 8-9 range   #2 full liquid diet today and n.p.o. after midnight   #3 will schedule for EGD and small bowel enteroscopy with Dr. Benson Norway on  Monday, 04/18/2014 Further plans pending findings of above   Dyke Weible  04/17/2014, 10:33 AM

## 2014-04-17 NOTE — Consult Note (Signed)
Patient seen, examined, and I agree with the above documentation, including the assessment and plan. Given history of gastric, duodenal and jejunal angiodysplastic lesions with bleeding, planned repeat small bowel enteroscopy with Dr. Collene Mares or Dr. Benson Norway Monitor hemoglobin, transfuse as necessary Consider IV iron prior to discharge

## 2014-04-17 NOTE — Progress Notes (Signed)
[facility] PROGRESS NOTE   Ricki Vanhandel WUJ:[id number] DOB: 1932-04-18 DOA: 04/16/2014 PCP: Elyn Peers, MD  HPI/Subjective: Denies any abdominal pain or complaints, some tenderness in the epigastric area  Assessment/Plan: Active Problems:   Hypertension   CKD (chronic kidney disease), stage III   Blood loss anemia   Acute blood loss anemia   Melena   GI bleed/melanotic stools. - Start her on IV PPI, give her clear liquid diet n.p.o.  - Bolus normal saline transfuse 1 unit of packed red blood cells check a CBC cultures fusion.  - I have consulted gastroenterology she will probably have a procedure tomorrow morning.  - Hold aspirin Tylenol for fever or pain.   Acute blood loss anemia -Presented with hemoglobin of 9.5, her average is between 11.2 and 13.2. -Patient transfuse 1 units of packed RBCs because of dizziness. -Hemoglobin now is 10.5, check hemoglobin serially, transfuse if hemoglobin <9.0.  CKD stage III -Creatinine baseline is 1.2 from 5/21. Presented with creatinine of 1.45. -This is not very far from her baseline, follow creatinine serially. -Continue IV fluids, hold losartan and hydrochlorothiazide.  Hypertension:  - Blood pressure within normal I will hold her ARB-II and diuretic   Code Status: Full code Family Communication: Plan discussed with the patient. Disposition Plan: Remains inpatient   Consultants:  Gastroenterology  Procedures:  None  Antibiotics:  None   Objective: Filed Vitals:   04/17/14 0429  BP: 95/62  Pulse: 63  Temp: 98.3 F (36.8 C)  Resp: 16    Intake/Output Summary (Last 24 hours) at 04/17/14 0904 Last data filed at 04/16/14 2130  Gross per 24 hour  Intake    110 ml  Output      0 ml  Net    110 ml   Filed Weights   04/16/14 1700  Weight: 60.827 kg (134 lb 1.6 oz)    Exam: General: Alert and awake, oriented x3, not in any acute distress. HEENT: anicteric sclera, pupils reactive to light  and accommodation, EOMI CVS: S1-S2 clear, no murmur rubs or gallops Chest: clear to auscultation bilaterally, no wheezing, rales or rhonchi Abdomen: soft nontender, nondistended, normal bowel sounds, no organomegaly Extremities: no cyanosis, clubbing or edema noted bilaterally Neuro: Cranial nerves II-XII intact, no focal neurological deficits  Data Reviewed: Basic Metabolic Panel:  Recent Labs Lab 04/12/14 1051 04/16/14 1244  NA 134* 138  K 5.2* 5.1  CL  --  96  CO2 27 26  GLUCOSE 80 110*  BUN 26.0 22  CREATININE 1.6* 1.45*  CALCIUM 10.1 9.9   Liver Function Tests:  Recent Labs Lab 04/12/14 1051 04/16/14 1244  AST 45* 34  ALT 33 24  ALKPHOS 101 92  BILITOT 0.36 <0.2*  PROT 7.9 8.0  ALBUMIN 3.7 3.7   No results found for this basename: LIPASE, AMYLASE,  in the last 168 hours No results found for this basename: AMMONIA,  in the last 168 hours CBC:  Recent Labs Lab 04/12/14 1051 04/16/14 1244 04/16/14 1649 04/17/14 0500  WBC 5.0 7.1 8.1 6.2  NEUTROABS 3.1  --   --   --   HGB 11.2* 9.5* 9.3* 10.5*  HCT 33.1* 29.0* 27.9* 31.4*  MCV 90.9 93.5 92.7 91.5  PLT 169 189 201 160   Cardiac Enzymes:  Recent Labs Lab 04/16/14 1244  TROPONINI <0.30   BNP (last 3 results) No results found for this basename: PROBNP,  in the last 8760 hours CBG: No results found for this basename: GLUCAP,  in the last 168 hours  Micro No results found for this or any previous visit (from the past 240 hour(s)).   Studies: Dg Chest 2 View  04/16/2014   CLINICAL DATA:  Productive cough for 5 days.  Short of breath.  EXAM: CHEST  2 VIEW  COMPARISON:  CT 03/08/2014. Chest radiographs dating back to 06/25/2008.  FINDINGS: Emphysema is present. Aortic atherosclerosis. Surgical clips in the LEFT chest, likely related to prior breast surgery. Atelectasis is present of the LEFT hemidiaphragm. There is no airspace disease. No pleural effusion. On the lateral view, there is enlargement of the  retrosternal clear space. Stable density over the RIGHT first rib end represents calcified costochondral cartilage. Bilateral pleural apical scarring noted. Cardiopericardial silhouette within normal limits.  IMPRESSION: Emphysema without acute cardiopulmonary disease.   Electronically Signed   By: Dereck Ligas M.D.   On: 04/16/2014 14:29    Scheduled Meds: . anastrozole  1 mg Oral Daily  . calcium carbonate  1 tablet Oral Q breakfast  . pantoprazole (PROTONIX) IV  40 mg Intravenous Q12H  . sodium chloride  500 mL Intravenous Once  . vitamin C  500 mg Oral Daily   Continuous Infusions: . sodium chloride 1 mL (04/17/14 0816)       Time spent: 35 minutes    [facility] A  [facility] Pager [phone] If 7PM-7AM, please contact night-coverage at [email or web address], password [facility] 04/17/2014, 9:04 AM  LOS: 1 day

## 2014-04-18 ENCOUNTER — Encounter (HOSPITAL_COMMUNITY): Payer: PRIVATE HEALTH INSURANCE | Admitting: Anesthesiology

## 2014-04-18 ENCOUNTER — Inpatient Hospital Stay (HOSPITAL_COMMUNITY): Payer: PRIVATE HEALTH INSURANCE | Admitting: Anesthesiology

## 2014-04-18 ENCOUNTER — Encounter (HOSPITAL_COMMUNITY)
Admission: EM | Disposition: A | Discharge status: Home / Self Care | Payer: Self-pay | Source: Home / Self Care | Attending: Internal Medicine

## 2014-04-18 ENCOUNTER — Encounter (HOSPITAL_COMMUNITY): Payer: Self-pay | Admitting: *Deleted

## 2014-04-18 DIAGNOSIS — K921 Melena: Secondary | ICD-10-CM | POA: Diagnosis not present

## 2014-04-18 DIAGNOSIS — K5521 Angiodysplasia of colon with hemorrhage: Secondary | ICD-10-CM | POA: Diagnosis not present

## 2014-04-18 DIAGNOSIS — D638 Anemia in other chronic diseases classified elsewhere: Secondary | ICD-10-CM

## 2014-04-18 HISTORY — PX: ENTEROSCOPY: SHX5533

## 2014-04-18 LAB — CBC
HCT: 30.2 % — ABNORMAL LOW (ref 36.0–46.0)
HCT: 29.6 % — ABNORMAL LOW (ref 36.0–46.0)
Hemoglobin: 10.2 g/dL — ABNORMAL LOW (ref 12.0–15.0)
Hemoglobin: 9.9 g/dL — ABNORMAL LOW (ref 12.0–15.0)
MCH: 31.6 pg (ref 26.0–34.0)
MCH: 30.8 pg (ref 26.0–34.0)
MCHC: 33.8 g/dL (ref 30.0–36.0)
MCHC: 33.4 g/dL (ref 30.0–36.0)
MCV: 93.5 fL (ref 78.0–100.0)
MCV: 92.2 fL (ref 78.0–100.0)
PLATELETS: 199 10*3/uL (ref 150–400)
Platelets: 184 10*3/uL (ref 150–400)
RBC: 3.23 MIL/uL — ABNORMAL LOW (ref 3.87–5.11)
RBC: 3.21 MIL/uL — AB (ref 3.87–5.11)
RDW: 14.2 % (ref 11.5–15.5)
RDW: 14 % (ref 11.5–15.5)
WBC: 5.7 10*3/uL (ref 4.0–10.5)
WBC: 9.3 10*3/uL (ref 4.0–10.5)

## 2014-04-18 LAB — BASIC METABOLIC PANEL
BUN: 12 mg/dL (ref 6–23)
CO2: 25 mEq/L (ref 19–32)
CREATININE: 1.3 mg/dL — AB (ref 0.50–1.10)
Calcium: 9.4 mg/dL (ref 8.4–10.5)
Chloride: 106 mEq/L (ref 96–112)
GFR, EST AFRICAN AMERICAN: 44 mL/min — AB (ref >90)
GFR, EST NON AFRICAN AMERICAN: 38 mL/min — AB (ref >90)
Glucose, Bld: 100 mg/dL — ABNORMAL HIGH (ref 70–99)
Potassium: 4.8 mEq/L (ref 3.7–5.3)
Sodium: 142 mEq/L (ref 137–147)

## 2014-04-18 SURGERY — ENTEROSCOPY
Anesthesia: Monitor Anesthesia Care

## 2014-04-18 MED ORDER — PROPOFOL INFUSION 10 MG/ML OPTIME
INTRAVENOUS | Status: DC | PRN
  Administered 2014-04-18: 100 ug/kg/min via INTRAVENOUS

## 2014-04-18 MED ORDER — PHENOL 1.4 % MT LIQD
1.0000 | OROMUCOSAL | Status: DC | PRN
  Filled 2014-04-18: qty 177

## 2014-04-18 MED ORDER — BUTAMBEN-TETRACAINE-BENZOCAINE 2-2-14 % EX AERO
INHALATION_SPRAY | CUTANEOUS | Status: DC | PRN
  Administered 2014-04-18: 2 via TOPICAL

## 2014-04-18 MED ORDER — PROPOFOL 10 MG/ML IV BOLUS
INTRAVENOUS | Status: DC | PRN
  Administered 2014-04-18 (×2): 50 mg via INTRAVENOUS

## 2014-04-18 MED ORDER — LACTATED RINGERS IV SOLN
INTRAVENOUS | Status: DC
  Administered 2014-04-18: 1000 mL via INTRAVENOUS

## 2014-04-18 MED ORDER — PHENYLEPHRINE HCL 10 MG/ML IJ SOLN
INTRAMUSCULAR | Status: DC | PRN
  Administered 2014-04-18: 80 ug via INTRAVENOUS

## 2014-04-18 NOTE — H&P (View-Only) (Signed)
[facility] PROGRESS NOTE   [name] TKW:[id number] DOB: 13-Nov-1931 DOA: 04/16/2014 PCP: Elyn Peers, MD  HPI/Subjective: Had 2 melanotic stools yesterday, denies any specific complaints today.  Assessment/Plan: Active Problems:   Hypertension   CKD (chronic kidney disease), stage III   Blood loss anemia   Acute blood loss anemia   Melena   GI bleed/melanotic stools. - Start her on IV PPI, give her clear liquid diet, no n.p.o. past midnight. - Bolus normal saline transfuse 1 unit of packed red blood cells check a CBC cultures fusion.  - Hold aspirin Tylenol for fever or pain.  - Seen by GI scheduled for enteroscopy today. I  Acute blood loss anemia -Presented with hemoglobin of 9.5, her average is between 11.2 and 13.2. -Patient transfuse 1 units of packed RBCs because of dizziness. -Hemoglobin now is 10.5, check hemoglobin serially, transfuse if hemoglobin <8.0.  CKD stage III -Creatinine baseline is 1.2 from 5/21. Presented with creatinine of 1.45. -This is not very far from her baseline, follow creatinine serially. -Continue IV fluids, hold losartan and hydrochlorothiazide.  Hypertension:  - Blood pressure within normal I will hold her ARB-II and diuretic   Code Status: Full code Family Communication: Plan discussed with the patient. Disposition Plan: Remains inpatient   Consultants:  Gastroenterology  Procedures:  None  Antibiotics:  None   Objective: Filed Vitals:   04/18/14 0437  BP: 112/64  Pulse: 79  Temp: 97.6 F (36.4 C)  Resp: 16    Intake/Output Summary (Last 24 hours) at 04/18/14 1137 Last data filed at 04/18/14 0845  Gross per 24 hour  Intake 953.75 ml  Output      0 ml  Net 953.75 ml   Filed Weights   04/16/14 1700  Weight: 60.827 kg (134 lb 1.6 oz)    Exam: General: Alert and awake, oriented x3, not in any acute distress. HEENT: anicteric sclera, pupils reactive to light and accommodation, EOMI CVS:  S1-S2 clear, no murmur rubs or gallops Chest: clear to auscultation bilaterally, no wheezing, rales or rhonchi Abdomen: soft nontender, nondistended, normal bowel sounds, no organomegaly Extremities: no cyanosis, clubbing or edema noted bilaterally Neuro: Cranial nerves II-XII intact, no focal neurological deficits  Data Reviewed: Basic Metabolic Panel:  Recent Labs Lab 04/12/14 1051 04/16/14 1244 04/18/14 0558  NA 134* 138 142  K 5.2* 5.1 4.8  CL  --  96 106  CO2 27 26 25   GLUCOSE 80 110* 100*  BUN 26.0 22 12  CREATININE 1.6* 1.45* 1.30*  CALCIUM 10.1 9.9 9.4   Liver Function Tests:  Recent Labs Lab 04/12/14 1051 04/16/14 1244  AST 45* 34  ALT 33 24  ALKPHOS 101 92  BILITOT 0.36 <0.2*  PROT 7.9 8.0  ALBUMIN 3.7 3.7   No results found for this basename: LIPASE, AMYLASE,  in the last 168 hours No results found for this basename: AMMONIA,  in the last 168 hours CBC:  Recent Labs Lab 04/12/14 1051 04/16/14 1244 04/16/14 1649 04/17/14 0500 04/17/14 1717 04/18/14 0558  WBC 5.0 7.1 8.1 6.2 5.9 5.7  NEUTROABS 3.1  --   --   --   --   --   HGB 11.2* 9.5* 9.3* 10.5* 10.3* 10.2*  HCT 33.1* 29.0* 27.9* 31.4* 30.0* 30.2*  MCV 90.9 93.5 92.7 91.5 92.0 93.5  PLT 169 189 201 160 176 184   Cardiac Enzymes:  Recent Labs Lab 04/16/14 1244  TROPONINI <0.30   BNP (last 3 results) No results found  for this basename: PROBNP,  in the last 8760 hours CBG: No results found for this basename: GLUCAP,  in the last 168 hours  Micro No results found for this or any previous visit (from the past 240 hour(s)).   Studies: Dg Chest 2 View  04/16/2014   CLINICAL DATA:  Productive cough for 5 days.  Short of breath.  EXAM: CHEST  2 VIEW  COMPARISON:  CT 03/08/2014. Chest radiographs dating back to 06/25/2008.  FINDINGS: Emphysema is present. Aortic atherosclerosis. Surgical clips in the LEFT chest, likely related to prior breast surgery. Atelectasis is present of the LEFT  hemidiaphragm. There is no airspace disease. No pleural effusion. On the lateral view, there is enlargement of the retrosternal clear space. Stable density over the RIGHT first rib end represents calcified costochondral cartilage. Bilateral pleural apical scarring noted. Cardiopericardial silhouette within normal limits.  IMPRESSION: Emphysema without acute cardiopulmonary disease.   Electronically Signed   By: Dereck Ligas M.D.   On: 04/16/2014 14:29    Scheduled Meds: . anastrozole  1 mg Oral Daily  . calcium carbonate  1 tablet Oral Q breakfast  . pantoprazole (PROTONIX) IV  40 mg Intravenous Q12H  . vitamin C  500 mg Oral Daily   Continuous Infusions: . sodium chloride 75 mL/hr at 04/17/14 1900       Time spent: 35 minutes    [facility] A  [facility] Pager [phone] If 7PM-7AM, please contact night-coverage at [email or web address], password [facility] 04/18/2014, 11:37 AM  LOS: 2 days

## 2014-04-18 NOTE — Progress Notes (Signed)
Utilization review completed.  

## 2014-04-18 NOTE — Op Note (Signed)
[facility] [place], [address]   OPERATIVE PROCEDURE REPORT  PATIENT: [name], [name]  MR#: [id number] BIRTHDATE: 30-Mar-1932 , 82  yrs. old GENDER: Female ENDOSCOPIST: Carol Ada, MD REFERRED BY: PROCEDURE DATE: 04/18/2014 PROCEDURE:   Small bowel enteroscopy with control of bleeding ASA CLASS:   Class III INDICATIONS:1.  control bleeding. MEDICATIONS: MAC sedation, administered by CRNA TOPICAL ANESTHETIC:   none  DESCRIPTION OF PROCEDURE:   After the risks benefits and alternatives of the procedure were thoroughly explained, informed consent was obtained.  The     endoscope was introduced through the mouth  and advanced to the proximal jejunum jejunum , limited by Without limitations.   The instrument was slowly withdrawn as the mucosa was fully examined.    FINDINGS:  Deep intubation of the small bowel was achived.  The endoscope was advanced to the hilt of the scope.  I presume that I was in the proximal jejunum.  Multiple AVMs were identified and all visualized AVMs were ablated with APC.  The bleeding was from the small bowel as there was evidence of fresh blood around one of the AVMs.  With cautery several of the AVMs bled briskly, but all were controlled with further cautery.  A hemoclip was deployed as I noted mucosal trauma with passage of the enteroscope.  I felt that the trauma was deeper than normally expected.  One small nonbleeding antral AVM was ablated.   Retroflexed views revealed no abnormalities.    The scope was then withdrawn from the patient and the procedure terminated.  COMPLICATIONS: There were no complications. ENDOSCOPIC IMPRESSION: 1) Small bowel AVMs.  One was noted to have fresh blood.  All were ablated with APC. 2) Nonbleeding antral AVM.  RECOMMENDATIONS: 1) Follow HGB and transfuse if necessary. REPEAT EXAM: Venia Carbon Date & Procedure]  _______________________________ eSignedCarol Ada, MD 04/18/2014 1:27 PM   CC:

## 2014-04-18 NOTE — Anesthesia Preprocedure Evaluation (Addendum)
Anesthesia Evaluation  Patient identified by MRN, date of birth, ID band Patient awake    Reviewed: Allergy & Precautions, H&P , NPO status , Patient's Chart, lab work & pertinent test results  History of Anesthesia Complications Negative for: history of anesthetic complications  Airway Mallampati: I      Dental  (+) Upper Dentures, Partial Lower   Pulmonary former smoker,    Pulmonary exam normal       Cardiovascular hypertension, Pt. on medications Rhythm:Regular     Neuro/Psych    GI/Hepatic Patient denies knowledge of hepatitis history, treatment or symptoms.    Endo/Other    Renal/GU Renal InsufficiencyRenal diseasePatient denies knowledge of diagnosis, treatment, type or symptoms.     Musculoskeletal   Abdominal Normal abdominal exam  (+)   Peds  Hematology  (+) anemia ,   Anesthesia Other Findings   Reproductive/Obstetrics                          Anesthesia Physical Anesthesia Plan  ASA: III  Anesthesia Plan: MAC   Post-op Pain Management:    Induction: Intravenous  Airway Management Planned: Nasal Cannula and Natural Airway  Additional Equipment:   Intra-op Plan:   Post-operative Plan:   Informed Consent:   Plan Discussed with: CRNA, Anesthesiologist and Surgeon  Anesthesia Plan Comments:        Anesthesia Quick Evaluation

## 2014-04-18 NOTE — Interval H&P Note (Signed)
History and Physical Interval Note:  04/18/2014 12:30 PM  Calise Dunckel  has presented today for surgery, with the diagnosis of melena,hx of AVM's  The various methods of treatment have been discussed with the patient and family. After consideration of risks, benefits and other options for treatment, the patient has consented to  Procedure(s): ESOPHAGOGASTRODUODENOSCOPY (EGD) (N/A) ENTEROSCOPY (N/A) as a surgical intervention .  The patient's history has been reviewed, patient examined, no change in status, stable for surgery.  I have reviewed the patient's chart and labs.  Questions were answered to the patient's satisfaction.     HUNG,PATRICK D

## 2014-04-18 NOTE — Progress Notes (Signed)
[facility] PROGRESS NOTE   [name] ZCH:[id number] DOB: January 11, 1932 DOA: 04/16/2014 PCP: Elyn Peers, MD  HPI/Subjective: Had 2 melanotic stools yesterday, denies any specific complaints today.  Assessment/Plan: Active Problems:   Hypertension   CKD (chronic kidney disease), stage III   Blood loss anemia   Acute blood loss anemia   Melena   GI bleed/melanotic stools. - Start her on IV PPI, give her clear liquid diet, no n.p.o. past midnight. - Bolus normal saline transfuse 1 unit of packed red blood cells check a CBC cultures fusion.  - Hold aspirin Tylenol for fever or pain.  - Seen by GI scheduled for enteroscopy today. I  Acute blood loss anemia -Presented with hemoglobin of 9.5, her average is between 11.2 and 13.2. -Patient transfuse 1 units of packed RBCs because of dizziness. -Hemoglobin now is 10.5, check hemoglobin serially, transfuse if hemoglobin <8.0.  CKD stage III -Creatinine baseline is 1.2 from 5/21. Presented with creatinine of 1.45. -This is not very far from her baseline, follow creatinine serially. -Continue IV fluids, hold losartan and hydrochlorothiazide.  Hypertension:  - Blood pressure within normal I will hold her ARB-II and diuretic   Code Status: Full code Family Communication: Plan discussed with the patient. Disposition Plan: Remains inpatient   Consultants:  Gastroenterology  Procedures:  None  Antibiotics:  None   Objective: Filed Vitals:   04/18/14 0437  BP: 112/64  Pulse: 79  Temp: 97.6 F (36.4 C)  Resp: 16    Intake/Output Summary (Last 24 hours) at 04/18/14 1137 Last data filed at 04/18/14 0845  Gross per 24 hour  Intake 953.75 ml  Output      0 ml  Net 953.75 ml   Filed Weights   04/16/14 1700  Weight: 60.827 kg (134 lb 1.6 oz)    Exam: General: Alert and awake, oriented x3, not in any acute distress. HEENT: anicteric sclera, pupils reactive to light and accommodation, EOMI CVS:  S1-S2 clear, no murmur rubs or gallops Chest: clear to auscultation bilaterally, no wheezing, rales or rhonchi Abdomen: soft nontender, nondistended, normal bowel sounds, no organomegaly Extremities: no cyanosis, clubbing or edema noted bilaterally Neuro: Cranial nerves II-XII intact, no focal neurological deficits  Data Reviewed: Basic Metabolic Panel:  Recent Labs Lab 04/12/14 1051 04/16/14 1244 04/18/14 0558  NA 134* 138 142  K 5.2* 5.1 4.8  CL  --  96 106  CO2 27 26 25   GLUCOSE 80 110* 100*  BUN 26.0 22 12  CREATININE 1.6* 1.45* 1.30*  CALCIUM 10.1 9.9 9.4   Liver Function Tests:  Recent Labs Lab 04/12/14 1051 04/16/14 1244  AST 45* 34  ALT 33 24  ALKPHOS 101 92  BILITOT 0.36 <0.2*  PROT 7.9 8.0  ALBUMIN 3.7 3.7   No results found for this basename: LIPASE, AMYLASE,  in the last 168 hours No results found for this basename: AMMONIA,  in the last 168 hours CBC:  Recent Labs Lab 04/12/14 1051 04/16/14 1244 04/16/14 1649 04/17/14 0500 04/17/14 1717 04/18/14 0558  WBC 5.0 7.1 8.1 6.2 5.9 5.7  NEUTROABS 3.1  --   --   --   --   --   HGB 11.2* 9.5* 9.3* 10.5* 10.3* 10.2*  HCT 33.1* 29.0* 27.9* 31.4* 30.0* 30.2*  MCV 90.9 93.5 92.7 91.5 92.0 93.5  PLT 169 189 201 160 176 184   Cardiac Enzymes:  Recent Labs Lab 04/16/14 1244  TROPONINI <0.30   BNP (last 3 results) No results found  for this basename: PROBNP,  in the last 8760 hours CBG: No results found for this basename: GLUCAP,  in the last 168 hours  Micro No results found for this or any previous visit (from the past 240 hour(s)).   Studies: Dg Chest 2 View  04/16/2014   CLINICAL DATA:  Productive cough for 5 days.  Short of breath.  EXAM: CHEST  2 VIEW  COMPARISON:  CT 03/08/2014. Chest radiographs dating back to 06/25/2008.  FINDINGS: Emphysema is present. Aortic atherosclerosis. Surgical clips in the LEFT chest, likely related to prior breast surgery. Atelectasis is present of the LEFT  hemidiaphragm. There is no airspace disease. No pleural effusion. On the lateral view, there is enlargement of the retrosternal clear space. Stable density over the RIGHT first rib end represents calcified costochondral cartilage. Bilateral pleural apical scarring noted. Cardiopericardial silhouette within normal limits.  IMPRESSION: Emphysema without acute cardiopulmonary disease.   Electronically Signed   By: Dereck Ligas M.D.   On: 04/16/2014 14:29    Scheduled Meds: . anastrozole  1 mg Oral Daily  . calcium carbonate  1 tablet Oral Q breakfast  . pantoprazole (PROTONIX) IV  40 mg Intravenous Q12H  . vitamin C  500 mg Oral Daily   Continuous Infusions: . sodium chloride 75 mL/hr at 04/17/14 1900       Time spent: 35 minutes    [facility] A  [facility] Pager [phone] If 7PM-7AM, please contact night-coverage at [email or web address], password [facility] 04/18/2014, 11:37 AM  LOS: 2 days

## 2014-04-18 NOTE — Anesthesia Procedure Notes (Signed)
Procedure Name: MAC Date/Time: 04/18/2014 12:55 PM Performed by: Izora Gala Pre-anesthesia Checklist: Patient identified, Emergency Drugs available and Suction available Patient Re-evaluated:Patient Re-evaluated prior to inductionOxygen Delivery Method: Nasal cannula Preoxygenation: Pre-oxygenation with 100% oxygen Intubation Type: IV induction

## 2014-04-19 ENCOUNTER — Encounter (HOSPITAL_COMMUNITY): Payer: Self-pay | Admitting: Gastroenterology

## 2014-04-19 LAB — CBC
HCT: 30.1 % — ABNORMAL LOW (ref 36.0–46.0)
HCT: 28.5 % — ABNORMAL LOW (ref 36.0–46.0)
Hemoglobin: 10.2 g/dL — ABNORMAL LOW (ref 12.0–15.0)
Hemoglobin: 9.6 g/dL — ABNORMAL LOW (ref 12.0–15.0)
MCH: 31.2 pg (ref 26.0–34.0)
MCH: 31.6 pg (ref 26.0–34.0)
MCHC: 33.9 g/dL (ref 30.0–36.0)
MCHC: 33.7 g/dL (ref 30.0–36.0)
MCV: 92 fL (ref 78.0–100.0)
MCV: 93.8 fL (ref 78.0–100.0)
Platelets: 207 10*3/uL (ref 150–400)
Platelets: 202 10*3/uL (ref 150–400)
RBC: 3.27 MIL/uL — AB (ref 3.87–5.11)
RBC: 3.04 MIL/uL — ABNORMAL LOW (ref 3.87–5.11)
RDW: 14.1 % (ref 11.5–15.5)
RDW: 14.3 % (ref 11.5–15.5)
WBC: 8.1 10*3/uL (ref 4.0–10.5)
WBC: 6.4 10*3/uL (ref 4.0–10.5)

## 2014-04-19 NOTE — Progress Notes (Signed)
CARE MANAGEMENT NOTE 04/19/2014  Patient:  [name], [name]   Account Number:  [id number]  Date Initiated:  04/19/2014  Documentation initiated by:  [facility]  Subjective/Objective Assessment:   admitted with melena, anemia     Action/Plan:   plan return home   Anticipated DC Date:  04/19/2014   Anticipated DC Plan:  [place]  CM consult      Choice offered to / List presented to:             Status of service:  Completed, signed off Medicare Important Message given?  YES (If response is "NO", the following Medicare IM given date fields will be blank) Date Medicare IM given:  04/19/2014 Medicare IM given by:  [facility] Date Additional Medicare IM given:   Additional Medicare IM given by:    Discharge Disposition:  HOME/SELF CARE  Per UR Regulation:    If discussed at Long Length of Stay Meetings, dates discussed:    Comments:

## 2014-04-19 NOTE — Progress Notes (Signed)
Subjective: No complaints.  Objective: Vital signs in last 24 hours: Temp:  [98 F (36.7 C)-98.9 F (37.2 C)] 98 F (36.7 C) (06/30 1316) Pulse Rate:  [70-77] 77 (06/30 1316) Resp:  [18-20] 20 (06/30 1316) BP: (116-140)/(46-66) 129/66 mmHg (06/30 1316) SpO2:  [94 %-99 %] 99 % (06/30 1316) Last BM Date: 04/18/14  Intake/Output from previous day: 06/29 0701 - 06/30 0700 In: 2420 [I.V.:2420] Out: -  Intake/Output this shift: Total I/O In: 360 [P.O.:360] Out: -   General appearance: alert and no distress GI: soft, non-tender; bowel sounds normal; no masses,  no organomegaly  Lab Results:  Recent Labs  04/18/14 1915 04/19/14 0500 04/19/14 1641  WBC 9.3 8.1 6.4  HGB 9.9* 10.2* 9.6*  HCT 29.6* 30.1* 28.5*  PLT 199 207 202   BMET  Recent Labs  04/18/14 0558  NA 142  K 4.8  CL 106  CO2 25  GLUCOSE 100*  BUN 12  CREATININE 1.30*  CALCIUM 9.4   LFT No results found for this basename: PROT, ALBUMIN, AST, ALT, ALKPHOS, BILITOT, BILIDIR, IBILI,  in the last 72 hours PT/INR No results found for this basename: LABPROT, INR,  in the last 72 hours Hepatitis Panel No results found for this basename: HEPBSAG, HCVAB, HEPAIGM, HEPBIGM,  in the last 72 hours C-Diff No results found for this basename: CDIFFTOX,  in the last 72 hours Fecal Lactopherrin No results found for this basename: FECLLACTOFRN,  in the last 72 hours  Studies/Results: No results found.  Medications:  Scheduled: . anastrozole  1 mg Oral Daily  . calcium carbonate  1 tablet Oral Q breakfast  . pantoprazole (PROTONIX) IV  40 mg Intravenous Q12H  . vitamin C  500 mg Oral Daily   Continuous: . sodium chloride 75 mL/hr at 04/19/14 0607    Assessment/Plan: 1) Bleeding small bowel AVMs s/p APC.     HGB is stable at this time.  No complaints.  Plan: 1) Okay to D/C home.   2) Follow up with Dr. Collene Mares.   LOS: 3 days   HUNG,PATRICK D 04/19/2014, 5:48 PM

## 2014-04-19 NOTE — Discharge Summary (Signed)
Physician Discharge Summary  [name] [id number]:[id number] DOB: 1932-03-08 DOA: 04/16/2014  PCP: Elyn Peers, MD  Admit date: 04/16/2014 Discharge date: 04/19/2014  Time spent: 40 minutes  Recommendations for Outpatient Follow-up:  1. Followup with primary care physician in one week. 2. Followup with gastroenterology as needed  Discharge Diagnoses:  Active Problems:   Hypertension   CKD (chronic kidney disease), stage III   Blood loss anemia   Acute blood loss anemia   Melena   Discharge Condition: Stable  Diet recommendation: Heart healthy diet  Filed Weights   04/16/14 1700  Weight: 60.827 kg (134 lb 1.6 oz)    History of present illness:  [name] is a 80 y.o. female  Past medical history of AVMs a colonoscopy 2 years prior to admission, she also had an EGD that showed esophagitis on aspirin that comes in for melanotic stools and shortness of breath. She relates she started having 1 or 2 bowel movements 3 days prior to admission are melanotic. On the day of admission she started getting short of breath and dizzy with generalized weakness. She has on her medication her mother's home she has not been taking.  Hospital Course:   GI bleed/melanotic stools.  - Start her on IV PPI, give her clear liquid diet, no n.p.o. past midnight.  - Bolus normal saline transfuse 1 unit of packed red blood cells check a CBC cultures fusion.  - Hold aspirin, Tylenol for fever or pain.  - Enteroscopy done yesterday and showed multiple AVMs, all ablated by APC. - Advise not to take the aspirin, followup with gastroenterology as outpatient.  Acute blood loss anemia  -Presented with hemoglobin of 9.5, her average is between 11.2 and 13.2.  -Patient transfuse 1 units of packed RBCs because of dizziness.  -Hemoglobin now is 10.5, check hemoglobin serially, transfuse if hemoglobin <8.0.  -Hemoglobin is 10.2 today.  CKD stage III  -Creatinine baseline is 1.2 from 5/21. Presented  with creatinine of 1.45.  -This is not very far from her baseline, follow creatinine serially.  -IV fluids started, losartan and hydrochlorothiazide held when she was in the hospital.  -Upon discharge home medications restarted, discharge creatinine is 1.3.  Hypertension:  - Blood pressure within normal I will hold her ARB-II and diuretic    Procedures:  Small bowel enteroscopy done by Dr. [place] Norway on 04/18/2014 showed small bowel AVMs, and was noted to have fresh blood, all were ablated with APC. Has nonbleeding antral AVM.  Consultations:  Gastroenterology  Discharge Exam: Filed Vitals:   04/19/14 0547  BP: 116/58  Pulse: 74  Temp: 98.4 F (36.9 C)  Resp: 18   General: Alert and awake, oriented x3, not in any acute distress. HEENT: anicteric sclera, pupils reactive to light and accommodation, EOMI CVS: S1-S2 clear, no murmur rubs or gallops Chest: clear to auscultation bilaterally, no wheezing, rales or rhonchi Abdomen: soft nontender, nondistended, normal bowel sounds, no organomegaly Extremities: no cyanosis, clubbing or edema noted bilaterally Neuro: Cranial nerves II-XII intact, no focal neurological deficits  Discharge Instructions You were cared for by a hospitalist during your hospital stay. If you have any questions about your discharge medications or the care you received while you were in the hospital after you are discharged, you can call the unit and asked to speak with the hospitalist on call if the hospitalist that took care of you is not available. Once you are discharged, your primary care physician will handle any further medical issues. Please note that NO  REFILLS for any discharge medications will be authorized once you are discharged, as it is imperative that you return to your primary care physician (or establish a relationship with a primary care physician if you do not have one) for your aftercare needs so that they can reassess your need for medications and  monitor your lab values.      Discharge Instructions   Diet - low sodium heart healthy    Complete by:  As directed      Increase activity slowly    Complete by:  As directed             Medication List    STOP taking these medications       aspirin EC 81 MG tablet      TAKE these medications       anastrozole 1 MG tablet  Commonly known as:  ARIMIDEX  Take 1 tablet (1 mg total) by mouth daily.     calcium carbonate 600 MG Tabs tablet  Commonly known as:  OS-CAL  Take 600 mg by mouth daily.     diclofenac sodium 1 % Gel  Commonly known as:  VOLTAREN  Apply 2 g topically 3 (three) times daily as needed. For arthritis pain     EXFORGE HCT 5-160-12.5 MG Tabs  Generic drug:  Amlodipine-Valsartan-HCTZ  Take 1 tablet by mouth daily.     Fish Oil 500 MG Caps  Take 1 capsule by mouth daily.     fluticasone 50 MCG/ACT nasal spray  Commonly known as:  FLONASE  Place 2 sprays into both nostrils daily as needed for allergies or rhinitis.     folic acid 030 MCG tablet  Commonly known as:  FOLVITE  Take 400-800 mcg by mouth daily.     loratadine 10 MG tablet  Commonly known as:  CLARITIN  Take 1 tablet (10 mg total) by mouth daily as needed for allergies or rhinitis.     omeprazole 20 MG capsule  Commonly known as:  PRILOSEC  Take 1 capsule (20 mg total) by mouth daily.     vitamin C 500 MG tablet  Commonly known as:  ASCORBIC ACID  Take 500 mg by mouth daily.     Vitamin D-3 1000 UNITS Caps  Take 1,000 Units by mouth daily.       No Known Allergies Follow-up Information   Follow up with Juanita Craver, MD.   Specialty:  Gastroenterology   Contact information:   [address] [place] [address] [phone]        The results of significant diagnostics from this hospitalization (including imaging, microbiology, ancillary and laboratory) are listed below for reference.    Significant Diagnostic Studies: Dg Chest 2 View  04/16/2014    CLINICAL DATA:  Productive cough for 5 days.  Short of breath.  EXAM: CHEST  2 VIEW  COMPARISON:  CT 03/08/2014. Chest radiographs dating back to 06/25/2008.  FINDINGS: Emphysema is present. Aortic atherosclerosis. Surgical clips in the LEFT chest, likely related to prior breast surgery. Atelectasis is present of the LEFT hemidiaphragm. There is no airspace disease. No pleural effusion. On the lateral view, there is enlargement of the retrosternal clear space. Stable density over the RIGHT first rib end represents calcified costochondral cartilage. Bilateral pleural apical scarring noted. Cardiopericardial silhouette within normal limits.  IMPRESSION: Emphysema without acute cardiopulmonary disease.   Electronically Signed   By: Dereck Ligas M.D.   On: 04/16/2014 14:29    Microbiology:  No results found for this or any previous visit (from the past 240 hour(s)).   Labs: Basic Metabolic Panel:  Recent Labs Lab 04/16/14 1244 04/18/14 0558  NA 138 142  K 5.1 4.8  CL 96 106  CO2 26 25  GLUCOSE 110* 100*  BUN 22 12  CREATININE 1.45* 1.30*  CALCIUM 9.9 9.4   Liver Function Tests:  Recent Labs Lab 04/16/14 1244  AST 34  ALT 24  ALKPHOS 92  BILITOT <0.2*  PROT 8.0  ALBUMIN 3.7   No results found for this basename: LIPASE, AMYLASE,  in the last 168 hours No results found for this basename: AMMONIA,  in the last 168 hours CBC:  Recent Labs Lab 04/17/14 0500 04/17/14 1717 04/18/14 0558 04/18/14 1915 04/19/14 0500  WBC 6.2 5.9 5.7 9.3 8.1  HGB 10.5* 10.3* 10.2* 9.9* 10.2*  HCT 31.4* 30.0* 30.2* 29.6* 30.1*  MCV 91.5 92.0 93.5 92.2 92.0  PLT 160 176 184 199 207   Cardiac Enzymes:  Recent Labs Lab 04/16/14 1244  TROPONINI <0.30   BNP: BNP (last 3 results) No results found for this basename: PROBNP,  in the last 8760 hours CBG: No results found for this basename: GLUCAP,  in the last 168 hours     Signed:  ELMAHI,MUTAZ A  [facility] 04/19/2014, 12:17  PM

## 2014-04-19 NOTE — Progress Notes (Signed)
Patient discharge teaching given, including activity, diet, follow-up appoints, and medications. Patient verbalized understanding of all discharge instructions. IV access was d/c'd. Vitals are stable. Skin is intact except as charted in most recent assessments. Pt to be escorted out by NT, to be driven home by family. 

## 2014-04-19 NOTE — Progress Notes (Signed)
Physician Discharge Summary  [name] KPT:[id number] DOB: February 02, 1932 DOA: 04/16/2014  PCP: Elyn Peers, MD  Admit date: 04/16/2014 Discharge date: 04/19/2014  Time spent: 40 minutes  Recommendations for Outpatient Follow-up:  1. Followup with primary care physician in one week. 2. Followup with gastroenterology as needed  Discharge Diagnoses:  Active Problems:   Hypertension   CKD (chronic kidney disease), stage III   Blood loss anemia   Acute blood loss anemia   Melena   Discharge Condition: Stable  Diet recommendation: Heart healthy diet  Filed Weights   04/16/14 1700  Weight: 60.827 kg (134 lb 1.6 oz)    History of present illness:  [name] is a 80 y.o. female  Past medical history of AVMs a colonoscopy 2 years prior to admission, she also had an EGD that showed esophagitis on aspirin that comes in for melanotic stools and shortness of breath. She relates she started having 1 or 2 bowel movements 3 days prior to admission are melanotic. On the day of admission she started getting short of breath and dizzy with generalized weakness. She has on her medication her mother's home she has not been taking.  Hospital Course:   GI bleed/melanotic stools.  - Start her on IV PPI, give her clear liquid diet, no n.p.o. past midnight.  - Bolus normal saline transfuse 1 unit of packed red blood cells check a CBC cultures fusion.  - Hold aspirin, Tylenol for fever or pain.  - Enteroscopy done yesterday and showed multiple AVMs, all ablated by APC. - Advise not to take the aspirin, followup with gastroenterology as outpatient.  Acute blood loss anemia  -Presented with hemoglobin of 9.5, her average is between 11.2 and 13.2.  -Patient transfuse 1 units of packed RBCs because of dizziness.  -Hemoglobin now is 10.5, check hemoglobin serially, transfuse if hemoglobin <8.0.  -Hemoglobin is 10.2 today.  CKD stage III  -Creatinine baseline is 1.2 from 5/21. Presented  with creatinine of 1.45.  -This is not very far from her baseline, follow creatinine serially.  -IV fluids started, losartan and hydrochlorothiazide held when she was in the hospital.  -Upon discharge home medications restarted, discharge creatinine is 1.3.  Hypertension:  - Blood pressure within normal I will hold her ARB-II and diuretic    Procedures:  Small bowel enteroscopy done by Dr. [place] Norway on 04/18/2014 showed small bowel AVMs, and was noted to have fresh blood, all were ablated with APC. Has nonbleeding antral AVM.  Consultations:  Gastroenterology  Discharge Exam: Filed Vitals:   04/19/14 0547  BP: 116/58  Pulse: 74  Temp: 98.4 F (36.9 C)  Resp: 18   General: Alert and awake, oriented x3, not in any acute distress. HEENT: anicteric sclera, pupils reactive to light and accommodation, EOMI CVS: S1-S2 clear, no murmur rubs or gallops Chest: clear to auscultation bilaterally, no wheezing, rales or rhonchi Abdomen: soft nontender, nondistended, normal bowel sounds, no organomegaly Extremities: no cyanosis, clubbing or edema noted bilaterally Neuro: Cranial nerves II-XII intact, no focal neurological deficits  Discharge Instructions You were cared for by a hospitalist during your hospital stay. If you have any questions about your discharge medications or the care you received while you were in the hospital after you are discharged, you can call the unit and asked to speak with the hospitalist on call if the hospitalist that took care of you is not available. Once you are discharged, your primary care physician will handle any further medical issues. Please note that NO  REFILLS for any discharge medications will be authorized once you are discharged, as it is imperative that you return to your primary care physician (or establish a relationship with a primary care physician if you do not have one) for your aftercare needs so that they can reassess your need for medications and  monitor your lab values.  Discharge Instructions   Diet - low sodium heart healthy    Complete by:  As directed      Increase activity slowly    Complete by:  As directed             Medication List    STOP taking these medications       aspirin EC 81 MG tablet      TAKE these medications       anastrozole 1 MG tablet  Commonly known as:  ARIMIDEX  Take 1 tablet (1 mg total) by mouth daily.     calcium carbonate 600 MG Tabs tablet  Commonly known as:  OS-CAL  Take 600 mg by mouth daily.     diclofenac sodium 1 % Gel  Commonly known as:  VOLTAREN  Apply 2 g topically 3 (three) times daily as needed. For arthritis pain     EXFORGE HCT 5-160-12.5 MG Tabs  Generic drug:  Amlodipine-Valsartan-HCTZ  Take 1 tablet by mouth daily.     Fish Oil 500 MG Caps  Take 1 capsule by mouth daily.     fluticasone 50 MCG/ACT nasal spray  Commonly known as:  FLONASE  Place 2 sprays into both nostrils daily as needed for allergies or rhinitis.     folic acid 324 MCG tablet  Commonly known as:  FOLVITE  Take 400-800 mcg by mouth daily.     loratadine 10 MG tablet  Commonly known as:  CLARITIN  Take 1 tablet (10 mg total) by mouth daily as needed for allergies or rhinitis.     omeprazole 20 MG capsule  Commonly known as:  PRILOSEC  Take 1 capsule (20 mg total) by mouth daily.     vitamin C 500 MG tablet  Commonly known as:  ASCORBIC ACID  Take 500 mg by mouth daily.     Vitamin D-3 1000 UNITS Caps  Take 1,000 Units by mouth daily.       No Known Allergies     Follow-up Information   Follow up with Juanita Craver, MD.   Specialty:  Gastroenterology   Contact information:   [address] [place] [address] [phone]        The results of significant diagnostics from this hospitalization (including imaging, microbiology, ancillary and laboratory) are listed below for reference.    Significant Diagnostic Studies: Dg Chest 2 View  04/16/2014    CLINICAL DATA:  Productive cough for 5 days.  Short of breath.  EXAM: CHEST  2 VIEW  COMPARISON:  CT 03/08/2014. Chest radiographs dating back to 06/25/2008.  FINDINGS: Emphysema is present. Aortic atherosclerosis. Surgical clips in the LEFT chest, likely related to prior breast surgery. Atelectasis is present of the LEFT hemidiaphragm. There is no airspace disease. No pleural effusion. On the lateral view, there is enlargement of the retrosternal clear space. Stable density over the RIGHT first rib end represents calcified costochondral cartilage. Bilateral pleural apical scarring noted. Cardiopericardial silhouette within normal limits.  IMPRESSION: Emphysema without acute cardiopulmonary disease.   Electronically Signed   By: Dereck Ligas M.D.   On: 04/16/2014 14:29    Microbiology:  No results found for this or any previous visit (from the past 240 hour(s)).   Labs: Basic Metabolic Panel:  Recent Labs Lab 04/16/14 1244 04/18/14 0558  NA 138 142  K 5.1 4.8  CL 96 106  CO2 26 25  GLUCOSE 110* 100*  BUN 22 12  CREATININE 1.45* 1.30*  CALCIUM 9.9 9.4   Liver Function Tests:  Recent Labs Lab 04/16/14 1244  AST 34  ALT 24  ALKPHOS 92  BILITOT <0.2*  PROT 8.0  ALBUMIN 3.7   No results found for this basename: LIPASE, AMYLASE,  in the last 168 hours No results found for this basename: AMMONIA,  in the last 168 hours CBC:  Recent Labs Lab 04/17/14 0500 04/17/14 1717 04/18/14 0558 04/18/14 1915 04/19/14 0500  WBC 6.2 5.9 5.7 9.3 8.1  HGB 10.5* 10.3* 10.2* 9.9* 10.2*  HCT 31.4* 30.0* 30.2* 29.6* 30.1*  MCV 91.5 92.0 93.5 92.2 92.0  PLT 160 176 184 199 207   Cardiac Enzymes:  Recent Labs Lab 04/16/14 1244  TROPONINI <0.30   BNP: BNP (last 3 results) No results found for this basename: PROBNP,  in the last 8760 hours CBG: No results found for this basename: GLUCAP,  in the last 168 hours     Signed:  ELMAHI,MUTAZ A  [facility] 04/19/2014, 12:13  PM

## 2014-04-19 NOTE — Anesthesia Postprocedure Evaluation (Signed)
  Anesthesia Post-op Note  Patient: [name]  Procedure(s) Performed: Procedure(s): ENTEROSCOPY (N/A)  Patient Location: PACU and Endoscopy Unit  Anesthesia Type:MAC  Level of Consciousness: awake  Airway and Oxygen Therapy: Patient Spontanous Breathing  Post-op Pain: mild  Post-op Assessment: Post-op Vital signs reviewed  Post-op Vital Signs: Reviewed  Last Vitals:  Filed Vitals:   04/19/14 1316  BP: 129/66  Pulse: 77  Temp: 36.7 C  Resp: 20    Complications: No apparent anesthesia complications

## 2014-04-19 NOTE — Transfer of Care (Signed)
Immediate Anesthesia Transfer of Care Note  Patient: [name]  Procedure(s) Performed: Procedure(s): ENTEROSCOPY (N/A)  Patient Location: PACU- ENDO recovery  Anesthesia Type:MAC  Level of Consciousness: sedated  Airway & Oxygen Therapy: Patient connected to nasal cannula oxygen  Post-op Assessment: Report given to PACU RN- ENDO Recovery  Post vital signs: Reviewed  Complications: No apparent anesthesia complications

## 2014-04-20 NOTE — Progress Notes (Signed)
Note below was from lab results 04/16/14 emergency room visit.       Patient Demographics     Patient Name Sex DOB SSN Address Phone    Kelly, [name] Female [birth year] [id number] [address] [phone] (Home) [phone] (Work)              pl check Received: 4 days ago     Wilmon Arms, MD Prentiss Bells, RN            Pl check if pt received Aranesp on June 23rd. If not pl call pt for Aranesp inj. Her Hb was below 10gm/dl on today's labs

## 2014-04-29 ENCOUNTER — Other Ambulatory Visit: Payer: Self-pay | Admitting: *Deleted

## 2014-04-29 DIAGNOSIS — C50912 Malignant neoplasm of unspecified site of left female breast: Secondary | ICD-10-CM

## 2014-04-29 MED ORDER — ANASTROZOLE 1 MG PO TABS: 1.0000 mg | ORAL_TABLET | Freq: Every day | ORAL | Status: DC

## 2014-05-11 ENCOUNTER — Other Ambulatory Visit: Payer: Self-pay | Admitting: *Deleted

## 2014-05-11 DIAGNOSIS — C50912 Malignant neoplasm of unspecified site of left female breast: Secondary | ICD-10-CM

## 2014-05-12 ENCOUNTER — Ambulatory Visit: Payer: PRIVATE HEALTH INSURANCE

## 2014-05-12 ENCOUNTER — Other Ambulatory Visit (HOSPITAL_BASED_OUTPATIENT_CLINIC_OR_DEPARTMENT_OTHER): Payer: PRIVATE HEALTH INSURANCE

## 2014-05-12 DIAGNOSIS — D638 Anemia in other chronic diseases classified elsewhere: Secondary | ICD-10-CM

## 2014-05-12 DIAGNOSIS — C50119 Malignant neoplasm of central portion of unspecified female breast: Secondary | ICD-10-CM

## 2014-05-12 DIAGNOSIS — C50912 Malignant neoplasm of unspecified site of left female breast: Secondary | ICD-10-CM

## 2014-05-12 LAB — COMPREHENSIVE METABOLIC PANEL (CC13)
ALBUMIN: 3.7 g/dL (ref 3.5–5.0)
ALT: 23 U/L (ref 0–55)
ANION GAP: 8 meq/L (ref 3–11)
AST: 35 U/L — ABNORMAL HIGH (ref 5–34)
Alkaline Phosphatase: 118 U/L (ref 40–150)
BILIRUBIN TOTAL: 0.31 mg/dL (ref 0.20–1.20)
BUN: 17.4 mg/dL (ref 7.0–26.0)
CO2: 26 meq/L (ref 22–29)
Calcium: 10.3 mg/dL (ref 8.4–10.4)
Chloride: 105 mEq/L (ref 98–109)
Creatinine: 1.4 mg/dL — ABNORMAL HIGH (ref 0.6–1.1)
GLUCOSE: 81 mg/dL (ref 70–140)
POTASSIUM: 5.1 meq/L (ref 3.5–5.1)
Sodium: 139 mEq/L (ref 136–145)
TOTAL PROTEIN: 8.2 g/dL (ref 6.4–8.3)

## 2014-05-12 LAB — CBC WITH DIFFERENTIAL/PLATELET
BASO%: 0.4 % (ref 0.0–2.0)
Basophils Absolute: 0 10*3/uL (ref 0.0–0.1)
EOS%: 1.8 % (ref 0.0–7.0)
Eosinophils Absolute: 0.1 10*3/uL (ref 0.0–0.5)
HCT: 34.6 % — ABNORMAL LOW (ref 34.8–46.6)
HEMOGLOBIN: 11.4 g/dL — AB (ref 11.6–15.9)
LYMPH%: 18.5 % (ref 14.0–49.7)
MCH: 30.6 pg (ref 25.1–34.0)
MCHC: 32.9 g/dL (ref 31.5–36.0)
MCV: 92.8 fL (ref 79.5–101.0)
MONO#: 0.6 10*3/uL (ref 0.1–0.9)
MONO%: 9.2 % (ref 0.0–14.0)
NEUT#: 4.7 10*3/uL (ref 1.5–6.5)
NEUT%: 70.1 % (ref 38.4–76.8)
Platelets: 274 10*3/uL (ref 145–400)
RBC: 3.73 10*6/uL (ref 3.70–5.45)
RDW: 13.8 % (ref 11.2–14.5)
WBC: 6.7 10*3/uL (ref 3.9–10.3)
lymph#: 1.2 10*3/uL (ref 0.9–3.3)

## 2014-05-12 MED ORDER — DARBEPOETIN ALFA-POLYSORBATE 500 MCG/ML IJ SOLN: 300.0000 ug | Freq: Once | INTRAMUSCULAR | Status: DC

## 2014-06-08 ENCOUNTER — Other Ambulatory Visit: Payer: Self-pay

## 2014-06-08 DIAGNOSIS — D638 Anemia in other chronic diseases classified elsewhere: Secondary | ICD-10-CM

## 2014-06-09 ENCOUNTER — Ambulatory Visit: Payer: PRIVATE HEALTH INSURANCE

## 2014-06-09 ENCOUNTER — Other Ambulatory Visit (HOSPITAL_BASED_OUTPATIENT_CLINIC_OR_DEPARTMENT_OTHER): Payer: PRIVATE HEALTH INSURANCE

## 2014-06-09 DIAGNOSIS — D638 Anemia in other chronic diseases classified elsewhere: Secondary | ICD-10-CM

## 2014-06-09 DIAGNOSIS — C50119 Malignant neoplasm of central portion of unspecified female breast: Secondary | ICD-10-CM

## 2014-06-09 LAB — COMPREHENSIVE METABOLIC PANEL (CC13)
ALT: 27 U/L (ref 0–55)
AST: 39 U/L — AB (ref 5–34)
Albumin: 3.6 g/dL (ref 3.5–5.0)
Alkaline Phosphatase: 118 U/L (ref 40–150)
Anion Gap: 10 mEq/L (ref 3–11)
BILIRUBIN TOTAL: 0.27 mg/dL (ref 0.20–1.20)
BUN: 19.2 mg/dL (ref 7.0–26.0)
CO2: 25 mEq/L (ref 22–29)
Calcium: 10.1 mg/dL (ref 8.4–10.4)
Chloride: 107 mEq/L (ref 98–109)
Creatinine: 1.4 mg/dL — ABNORMAL HIGH (ref 0.6–1.1)
Glucose: 79 mg/dl (ref 70–140)
Potassium: 4.3 mEq/L (ref 3.5–5.1)
SODIUM: 141 meq/L (ref 136–145)
TOTAL PROTEIN: 8.1 g/dL (ref 6.4–8.3)

## 2014-06-09 LAB — CBC WITH DIFFERENTIAL/PLATELET
BASO%: 1.1 % (ref 0.0–2.0)
Basophils Absolute: 0.1 10*3/uL (ref 0.0–0.1)
EOS%: 2 % (ref 0.0–7.0)
Eosinophils Absolute: 0.1 10*3/uL (ref 0.0–0.5)
HEMATOCRIT: 35.7 % (ref 34.8–46.6)
HGB: 11.7 g/dL (ref 11.6–15.9)
LYMPH#: 1.1 10*3/uL (ref 0.9–3.3)
LYMPH%: 14.5 % (ref 14.0–49.7)
MCH: 30.7 pg (ref 25.1–34.0)
MCHC: 32.9 g/dL (ref 31.5–36.0)
MCV: 93.4 fL (ref 79.5–101.0)
MONO#: 0.7 10*3/uL (ref 0.1–0.9)
MONO%: 9.2 % (ref 0.0–14.0)
NEUT#: 5.3 10*3/uL (ref 1.5–6.5)
NEUT%: 73.2 % (ref 38.4–76.8)
Platelets: 236 10*3/uL (ref 145–400)
RBC: 3.82 10*6/uL (ref 3.70–5.45)
RDW: 14.1 % (ref 11.2–14.5)
WBC: 7.3 10*3/uL (ref 3.9–10.3)

## 2014-06-09 MED ORDER — DARBEPOETIN ALFA-POLYSORBATE 500 MCG/ML IJ SOLN: 300.0000 ug | Freq: Once | INTRAMUSCULAR | Status: DC

## 2014-07-06 ENCOUNTER — Other Ambulatory Visit: Payer: Self-pay

## 2014-07-06 DIAGNOSIS — C50919 Malignant neoplasm of unspecified site of unspecified female breast: Secondary | ICD-10-CM

## 2014-07-07 ENCOUNTER — Other Ambulatory Visit (HOSPITAL_BASED_OUTPATIENT_CLINIC_OR_DEPARTMENT_OTHER): Payer: PRIVATE HEALTH INSURANCE

## 2014-07-07 ENCOUNTER — Ambulatory Visit: Payer: PRIVATE HEALTH INSURANCE

## 2014-07-07 DIAGNOSIS — C50119 Malignant neoplasm of central portion of unspecified female breast: Secondary | ICD-10-CM

## 2014-07-07 DIAGNOSIS — D638 Anemia in other chronic diseases classified elsewhere: Secondary | ICD-10-CM

## 2014-07-07 DIAGNOSIS — C50919 Malignant neoplasm of unspecified site of unspecified female breast: Secondary | ICD-10-CM

## 2014-07-07 LAB — CBC WITH DIFFERENTIAL/PLATELET
BASO%: 0.9 % (ref 0.0–2.0)
BASOS ABS: 0.1 10*3/uL (ref 0.0–0.1)
EOS%: 2.1 % (ref 0.0–7.0)
Eosinophils Absolute: 0.1 10*3/uL (ref 0.0–0.5)
HEMATOCRIT: 36.1 % (ref 34.8–46.6)
HEMOGLOBIN: 11.6 g/dL (ref 11.6–15.9)
LYMPH#: 1.1 10*3/uL (ref 0.9–3.3)
LYMPH%: 19.6 % (ref 14.0–49.7)
MCH: 30 pg (ref 25.1–34.0)
MCHC: 32.2 g/dL (ref 31.5–36.0)
MCV: 93.3 fL (ref 79.5–101.0)
MONO#: 0.7 10*3/uL (ref 0.1–0.9)
MONO%: 12.7 % (ref 0.0–14.0)
NEUT#: 3.6 10*3/uL (ref 1.5–6.5)
NEUT%: 64.7 % (ref 38.4–76.8)
PLATELETS: 221 10*3/uL (ref 145–400)
RBC: 3.87 10*6/uL (ref 3.70–5.45)
RDW: 14 % (ref 11.2–14.5)
WBC: 5.6 10*3/uL (ref 3.9–10.3)

## 2014-07-07 LAB — COMPREHENSIVE METABOLIC PANEL
ALT: 42 U/L — ABNORMAL HIGH (ref 0–35)
AST: 46 U/L — ABNORMAL HIGH (ref 0–37)
Albumin: 4 g/dL (ref 3.5–5.2)
Alkaline Phosphatase: 98 U/L (ref 39–117)
BUN: 24 mg/dL — ABNORMAL HIGH (ref 6–23)
CALCIUM: 10 mg/dL (ref 8.4–10.5)
CHLORIDE: 104 meq/L (ref 96–112)
CO2: 25 mEq/L (ref 19–32)
CREATININE: 1.45 mg/dL — AB (ref 0.50–1.10)
Glucose, Bld: 77 mg/dL (ref 70–99)
Potassium: 4.8 mEq/L (ref 3.5–5.3)
Sodium: 137 mEq/L (ref 135–145)
Total Bilirubin: 0.3 mg/dL (ref 0.2–1.2)
Total Protein: 8 g/dL (ref 6.0–8.3)

## 2014-07-07 MED ORDER — DARBEPOETIN ALFA-POLYSORBATE 500 MCG/ML IJ SOLN: 300.0000 ug | Freq: Once | INTRAMUSCULAR | Status: DC

## 2014-07-17 ENCOUNTER — Emergency Department (HOSPITAL_COMMUNITY)
Admission: EM | Admit: 2014-07-17 | Discharge: 2014-07-17 | Disposition: A | Discharge status: Home / Self Care | Payer: PRIVATE HEALTH INSURANCE | Attending: Emergency Medicine | Admitting: Emergency Medicine

## 2014-07-17 ENCOUNTER — Encounter (HOSPITAL_COMMUNITY): Payer: Self-pay | Admitting: Emergency Medicine

## 2014-07-17 ENCOUNTER — Emergency Department (HOSPITAL_COMMUNITY): Payer: PRIVATE HEALTH INSURANCE

## 2014-07-17 DIAGNOSIS — Z8659 Personal history of other mental and behavioral disorders: Secondary | ICD-10-CM | POA: Diagnosis not present

## 2014-07-17 DIAGNOSIS — R5383 Other fatigue: Secondary | ICD-10-CM | POA: Diagnosis not present

## 2014-07-17 DIAGNOSIS — Z862 Personal history of diseases of the blood and blood-forming organs and certain disorders involving the immune mechanism: Secondary | ICD-10-CM | POA: Diagnosis not present

## 2014-07-17 DIAGNOSIS — Z853 Personal history of malignant neoplasm of breast: Secondary | ICD-10-CM | POA: Diagnosis not present

## 2014-07-17 DIAGNOSIS — Z8639 Personal history of other endocrine, nutritional and metabolic disease: Secondary | ICD-10-CM | POA: Insufficient documentation

## 2014-07-17 DIAGNOSIS — R531 Weakness: Secondary | ICD-10-CM

## 2014-07-17 DIAGNOSIS — D649 Anemia, unspecified: Secondary | ICD-10-CM | POA: Insufficient documentation

## 2014-07-17 DIAGNOSIS — R404 Transient alteration of awareness: Secondary | ICD-10-CM | POA: Insufficient documentation

## 2014-07-17 DIAGNOSIS — Z79899 Other long term (current) drug therapy: Secondary | ICD-10-CM | POA: Insufficient documentation

## 2014-07-17 DIAGNOSIS — Z8719 Personal history of other diseases of the digestive system: Secondary | ICD-10-CM | POA: Insufficient documentation

## 2014-07-17 DIAGNOSIS — Z87891 Personal history of nicotine dependence: Secondary | ICD-10-CM | POA: Insufficient documentation

## 2014-07-17 DIAGNOSIS — R55 Syncope and collapse: Secondary | ICD-10-CM | POA: Diagnosis not present

## 2014-07-17 DIAGNOSIS — R5381 Other malaise: Secondary | ICD-10-CM | POA: Insufficient documentation

## 2014-07-17 DIAGNOSIS — I10 Essential (primary) hypertension: Secondary | ICD-10-CM | POA: Insufficient documentation

## 2014-07-17 LAB — CBC WITH DIFFERENTIAL/PLATELET
BASOS PCT: 0 % (ref 0–1)
Basophils Absolute: 0 10*3/uL (ref 0.0–0.1)
Eosinophils Absolute: 0 10*3/uL (ref 0.0–0.7)
Eosinophils Relative: 1 % (ref 0–5)
HCT: 36.1 % (ref 36.0–46.0)
Hemoglobin: 12.2 g/dL (ref 12.0–15.0)
LYMPHS PCT: 20 % (ref 12–46)
Lymphs Abs: 1.2 10*3/uL (ref 0.7–4.0)
MCH: 30.3 pg (ref 26.0–34.0)
MCHC: 33.8 g/dL (ref 30.0–36.0)
MCV: 89.8 fL (ref 78.0–100.0)
MONOS PCT: 6 % (ref 3–12)
Monocytes Absolute: 0.4 10*3/uL (ref 0.1–1.0)
NEUTROS ABS: 4.6 10*3/uL (ref 1.7–7.7)
Neutrophils Relative %: 73 % (ref 43–77)
Platelets: 225 10*3/uL (ref 150–400)
RBC: 4.02 MIL/uL (ref 3.87–5.11)
RDW: 13.3 % (ref 11.5–15.5)
WBC: 6.2 10*3/uL (ref 4.0–10.5)

## 2014-07-17 LAB — COMPREHENSIVE METABOLIC PANEL
ALT: 32 U/L (ref 0–35)
ANION GAP: 12 (ref 5–15)
AST: 39 U/L — ABNORMAL HIGH (ref 0–37)
Albumin: 3.8 g/dL (ref 3.5–5.2)
Alkaline Phosphatase: 121 U/L — ABNORMAL HIGH (ref 39–117)
BILIRUBIN TOTAL: 0.3 mg/dL (ref 0.3–1.2)
BUN: 19 mg/dL (ref 6–23)
CHLORIDE: 100 meq/L (ref 96–112)
CO2: 27 meq/L (ref 19–32)
CREATININE: 1.24 mg/dL — AB (ref 0.50–1.10)
Calcium: 10.4 mg/dL (ref 8.4–10.5)
GFR calc Af Amer: 46 mL/min — ABNORMAL LOW (ref >90)
GFR, EST NON AFRICAN AMERICAN: 40 mL/min — AB (ref >90)
Glucose, Bld: 104 mg/dL — ABNORMAL HIGH (ref 70–99)
Potassium: 5.3 mEq/L (ref 3.7–5.3)
Sodium: 139 mEq/L (ref 137–147)
Total Protein: 9 g/dL — ABNORMAL HIGH (ref 6.0–8.3)

## 2014-07-17 MED ORDER — SODIUM CHLORIDE 0.9 % IV BOLUS (SEPSIS)
500.0000 mL | Freq: Once | INTRAVENOUS | Status: AC
Start: 2014-07-17 — End: 2014-07-17
  Administered 2014-07-17: 500 mL via INTRAVENOUS

## 2014-07-17 NOTE — Discharge Instructions (Signed)
Return to the emergency department if you develop severe headache, chest pain, difficulty breathing, or other new and concerning symptoms.   Syncope Syncope is a medical term for fainting or passing out. This means you lose consciousness and drop to the ground. People are generally unconscious for less than 5 minutes. You may have some muscle twitches for up to 15 seconds before waking up and returning to normal. Syncope occurs more often in older adults, but it can happen to anyone. While most causes of syncope are not dangerous, syncope can be a sign of a serious medical problem. It is important to seek medical care.  CAUSES  Syncope is caused by a sudden drop in blood flow to the brain. The specific cause is often not determined. Factors that can bring on syncope include:  Taking medicines that lower blood pressure.  Sudden changes in posture, such as standing up quickly.  Taking more medicine than prescribed.  Standing in one place for too long.  Seizure disorders.  Dehydration and excessive exposure to heat.  Low blood sugar (hypoglycemia).  Straining to have a bowel movement.  Heart disease, irregular heartbeat, or other circulatory problems.  Fear, emotional distress, seeing blood, or severe pain. SYMPTOMS  Right before fainting, you may:  Feel dizzy or light-headed.  Feel nauseous.  See all white or all black in your field of vision.  Have cold, clammy skin. DIAGNOSIS  Your health care provider will ask about your symptoms, perform a physical exam, and perform an electrocardiogram (ECG) to record the electrical activity of your heart. Your health care provider may also perform other heart or blood tests to determine the cause of your syncope which may include:  Transthoracic echocardiogram (TTE). During echocardiography, sound waves are used to evaluate how blood flows through your heart.  Transesophageal echocardiogram (TEE).  Cardiac monitoring. This allows your  health care provider to monitor your heart rate and rhythm in real time.  Holter monitor. This is a portable device that records your heartbeat and can help diagnose heart arrhythmias. It allows your health care provider to track your heart activity for several days, if needed.  Stress tests by exercise or by giving medicine that makes the heart beat faster. TREATMENT  In most cases, no treatment is needed. Depending on the cause of your syncope, your health care provider may recommend changing or stopping some of your medicines. HOME CARE INSTRUCTIONS  Have someone stay with you until you feel stable.  Do not drive, use machinery, or play sports until your health care provider says it is okay.  Keep all follow-up appointments as directed by your health care provider.  Lie down right away if you start feeling like you might faint. Breathe deeply and steadily. Wait until all the symptoms have passed.  Drink enough fluids to keep your urine clear or pale yellow.  If you are taking blood pressure or heart medicine, get up slowly and take several minutes to sit and then stand. This can reduce dizziness. SEEK IMMEDIATE MEDICAL CARE IF:   You have a severe headache.  You have unusual pain in the chest, abdomen, or back.  You are bleeding from your mouth or rectum, or you have black or tarry stool.  You have an irregular or very fast heartbeat.  You have pain with breathing.  You have repeated fainting or seizure-like jerking during an episode.  You faint when sitting or lying down.  You have confusion.  You have trouble walking.  You  have severe weakness.  You have vision problems. If you fainted, call your local emergency services (911 in [place] Do not drive yourself to the hospital.  MAKE SURE YOU:  Understand these instructions.  Will watch your condition.  Will get help right away if you are not doing well or get worse. Document Released: 10/07/2005 Document Revised:  10/12/2013 Document Reviewed: 12/06/2011 [facility] Patient Information 2015 [place], [place]. This information is not intended to replace advice given to you by your health care provider. Make sure you discuss any questions you have with your health care provider.

## 2014-07-17 NOTE — Progress Notes (Signed)
Transport to CT refused per [name]. Due to RN attempting IV access on pt at 1605

## 2014-07-17 NOTE — ED Notes (Addendum)
Pt presents to department for evaluation of syncopal episode. States she became sweaty and passed out this morning at home. Also c/o headache and generalized weakness. Pt is alert and oriented x4.

## 2014-07-17 NOTE — ED Notes (Signed)
Patient transported to CT 

## 2014-07-17 NOTE — ED Notes (Signed)
Unable to start another IV; patient states IV team usually has to stick her and asked if she could drink fluids instead. Notified Dr. Stark Jock; he stated ok to wait on IV access, hold fluids and have her drink. If admitted, IV team will have to be called. For now, ok for no access.

## 2014-07-17 NOTE — ED Provider Notes (Signed)
CSN: [id number]     Arrival date & time 07/17/14  1359 History   First MD Initiated Contact with Patient 07/17/14 1457     Chief Complaint  Patient presents with  . Loss of Consciousness     (Consider location/radiation/quality/duration/timing/severity/associated sxs/prior Treatment) HPI Comments: Patient is an 80 year old female who presents for evaluation of syncope. She states she woke up this morning to get ready for church when she suddenly felt flushed and lightheaded. She felt extremely weak and became sweaty at which time she "passed out". This episode lasted for approximately 5 minutes then seemed to resolve on its own. She remains somewhat weak, however has no chest pain or difficulty breathing. She does complain of a mild headache.  Patient is a 80 y.o. female presenting with syncope. The history is provided by the patient.  Loss of Consciousness Episode history:  Single Most recent episode:  Today Duration:  5 minutes Timing:  Constant Progression:  Resolved Chronicity:  New Relieved by:  Nothing Worsened by:  Nothing tried Ineffective treatments:  None tried Associated symptoms: no nausea and no palpitations     Past Medical History  Diagnosis Date  . Anemia of chronic disease   . Anxiety   . Hypertension   . Hepatitis   . Lipid disorder   . Wears glasses   . Renal insufficiency   . Wears dentures     top  . Breast cancer 08/28/12    IDC left breast bx=invasive ductal ca,ER/PR=+  . Breast cancer 09/01/2012  . Radiation 11/12/12-12/15/11    Left Breast/50 Gy   Past Surgical History  Procedure Laterality Date  . Abdominal hysterectomy       approx 20 years ago  . Appendectomy      20 years ago  . Foot surgery  1992    bone spurs both feet  . Colonoscopy  2009?  [name] Partial mastectomy with needle localization and axillary sentinel lymph node bx  10/08/2012    Procedure: PARTIAL MASTECTOMY WITH NEEDLE LOCALIZATION AND AXILLARY SENTINEL LYMPH NODE BX;  Surgeon:  Adin Hector, MD;  Location: [facility];  Service: General;  Laterality: Left;  Left partial mastectomy with Needle localization and Sentinel lymph node biospy  . Enteroscopy N/A 11/08/2013    Procedure: ENTEROSCOPY;  Surgeon: Beryle Beams, MD;  Location: [facility];  Service: Endoscopy;  Laterality: N/A;  . Bones spur      removed 20 years ago  . Enteroscopy N/A 04/18/2014    Procedure: ENTEROSCOPY;  Surgeon: Beryle Beams, MD;  Location: [facility];  Service: Endoscopy;  Laterality: N/A;   Family History  Problem Relation Age of Onset  . Cancer Father   . Prostate cancer Father   . Breast cancer Cousin   . Tuberculosis Mother    History  Substance Use Topics  . Smoking status: Former Smoker -- 1.00 packs/day    Types: Cigarettes    Quit date: 10/27/2007  . Smokeless tobacco: Never Used     Comment: started smoking age 69  . Alcohol Use: 0.6 oz/week    1 Glasses of wine per week     Comment:  glass wine per week   OB History   Grav Para Term Preterm Abortions TAB SAB Ect Mult Living                 Review of Systems  Cardiovascular: Positive for syncope. Negative for palpitations.  Gastrointestinal: Negative for nausea.  All other systems  reviewed and are negative.     Allergies  Review of patient's allergies indicates no known allergies.  Home Medications   Prior to Admission medications   Medication Sig Start Date End Date Taking? Authorizing Provider  Amlodipine-Valsartan-HCTZ (EXFORGE HCT) 5-160-12.5 MG TABS Take 1 tablet by mouth daily.    Historical Provider, MD  anastrozole (ARIMIDEX) 1 MG tablet Take 1 tablet (1 mg total) by mouth daily. 04/29/14   Minette Headland, NP  calcium carbonate (OS-CAL) 600 MG TABS Take 600 mg by mouth daily.    Historical Provider, MD  Cholecalciferol (VITAMIN D-3) 1000 UNITS CAPS Take 1,000 Units by mouth daily.    Historical Provider, MD  diclofenac sodium (VOLTAREN) 1 % GEL Apply 2 g topically 3 (three)  times daily as needed. For arthritis pain    Historical Provider, MD  fluticasone (FLONASE) 50 MCG/ACT nasal spray Place 2 sprays into both nostrils daily as needed for allergies or rhinitis. 11/08/13   Ripudeep Krystal Eaton, MD  folic acid (FOLVITE) 127 MCG tablet Take 400-800 mcg by mouth daily.    Historical Provider, MD  loratadine (CLARITIN) 10 MG tablet Take 1 tablet (10 mg total) by mouth daily as needed for allergies or rhinitis. 11/08/13   Ripudeep Krystal Eaton, MD  Omega-3 Fatty Acids (FISH OIL) 500 MG CAPS Take 1 capsule by mouth daily.    Historical Provider, MD  omeprazole (PRILOSEC) 20 MG capsule Take 1 capsule (20 mg total) by mouth daily. 11/08/13   Ripudeep Krystal Eaton, MD  vitamin C (ASCORBIC ACID) 500 MG tablet Take 500 mg by mouth daily.    Historical Provider, MD   BP 140/53  Pulse 73  Temp(Src) 98 F (36.7 C) (Oral)  Resp 18  Ht 5\' 2"  (1.575 m)  Wt 132 lb (59.875 kg)  BMI 24.14 kg/m2  SpO2 100% Physical Exam  Nursing note and vitals reviewed. Constitutional: She is oriented to person, place, and time. She appears well-developed and well-nourished. No distress.  HENT:  Head: Normocephalic and atraumatic.  Mouth/Throat: Oropharynx is clear and moist.  Eyes: EOM are normal. Pupils are equal, round, and reactive to light.  Neck: Normal range of motion. Neck supple.  Cardiovascular: Normal rate and regular rhythm.  Exam reveals no gallop and no friction rub.   No murmur heard. Pulmonary/Chest: Effort normal and breath sounds normal. No respiratory distress. She has no wheezes.  Abdominal: Soft. Bowel sounds are normal. She exhibits no distension. There is no tenderness.  Musculoskeletal: Normal range of motion.  Neurological: She is alert and oriented to person, place, and time. No cranial nerve deficit. She exhibits normal muscle tone. Coordination normal.  Skin: Skin is warm and dry. She is not diaphoretic.    ED Course  Procedures (including critical care time) Labs Review Labs  Reviewed  COMPREHENSIVE METABOLIC PANEL - Abnormal; Notable for the following:    Glucose, Bld 104 (*)    Creatinine, Ser 1.24 (*)    Total Protein 9.0 (*)    AST 39 (*)    Alkaline Phosphatase 121 (*)    GFR calc non Af Amer 40 (*)    GFR calc Af Amer 46 (*)    All other components within normal limits  CBC WITH DIFFERENTIAL    Imaging Review No results found.   EKG Interpretation   Date/Time:  Sunday July 17 2014 14:08:07 EDT Ventricular Rate:  73 PR Interval:  192 QRS Duration: 78 QT Interval:  392 QTC Calculation: 431 R Axis:   -  2 Text Interpretation:  Normal sinus rhythm Normal ECG Confirmed by DELOS   MD, Ahmaud Duthie ([id number]) on 07/17/2014 3:12:51 PM      MDM   Final diagnoses:  None    Patient is an 80 year old female who presents after an episode of weakness, dizziness, and syncope. Her vital signs are stable and she appears well. She is complaining of mild headache, head CT is unremarkable. Her laboratory studies are essentially unremarkable as well. She has a history of anemia, however hemoglobin today is 12 and I do not feel this to be the cause. She has been observed for several hours and has had no further episodes. I feel as though she is appropriate for discharge. To return as needed if she worsens and followup with her primary Dr.   Veryl Speak, MD 07/17/14 [id number]

## 2014-07-17 NOTE — ED Notes (Signed)
Given 2 gingerales.

## 2014-08-03 ENCOUNTER — Other Ambulatory Visit: Payer: Self-pay

## 2014-08-03 DIAGNOSIS — C50919 Malignant neoplasm of unspecified site of unspecified female breast: Secondary | ICD-10-CM

## 2014-08-04 ENCOUNTER — Other Ambulatory Visit (HOSPITAL_BASED_OUTPATIENT_CLINIC_OR_DEPARTMENT_OTHER): Payer: PRIVATE HEALTH INSURANCE

## 2014-08-04 ENCOUNTER — Ambulatory Visit: Payer: PRIVATE HEALTH INSURANCE

## 2014-08-04 DIAGNOSIS — C50112 Malignant neoplasm of central portion of left female breast: Secondary | ICD-10-CM

## 2014-08-04 DIAGNOSIS — C50919 Malignant neoplasm of unspecified site of unspecified female breast: Secondary | ICD-10-CM

## 2014-08-04 LAB — CBC WITH DIFFERENTIAL/PLATELET
BASO%: 0.5 % (ref 0.0–2.0)
Basophils Absolute: 0 10*3/uL (ref 0.0–0.1)
EOS%: 2.3 % (ref 0.0–7.0)
Eosinophils Absolute: 0.1 10*3/uL (ref 0.0–0.5)
HCT: 36 % (ref 34.8–46.6)
HGB: 12.2 g/dL (ref 11.6–15.9)
LYMPH#: 1.2 10*3/uL (ref 0.9–3.3)
LYMPH%: 20.9 % (ref 14.0–49.7)
MCH: 31.2 pg (ref 25.1–34.0)
MCHC: 33.9 g/dL (ref 31.5–36.0)
MCV: 92.1 fL (ref 79.5–101.0)
MONO#: 0.5 10*3/uL (ref 0.1–0.9)
MONO%: 9.5 % (ref 0.0–14.0)
NEUT#: 3.7 10*3/uL (ref 1.5–6.5)
NEUT%: 66.8 % (ref 38.4–76.8)
Platelets: 233 10*3/uL (ref 145–400)
RBC: 3.91 10*6/uL (ref 3.70–5.45)
RDW: 13.4 % (ref 11.2–14.5)
WBC: 5.6 10*3/uL (ref 3.9–10.3)

## 2014-08-04 LAB — COMPREHENSIVE METABOLIC PANEL (CC13)
ALT: 30 U/L (ref 0–55)
ANION GAP: 11 meq/L (ref 3–11)
AST: 38 U/L — AB (ref 5–34)
Albumin: 3.6 g/dL (ref 3.5–5.0)
Alkaline Phosphatase: 124 U/L (ref 40–150)
BUN: 21.5 mg/dL (ref 7.0–26.0)
CHLORIDE: 108 meq/L (ref 98–109)
CO2: 23 meq/L (ref 22–29)
Calcium: 10.2 mg/dL (ref 8.4–10.4)
Creatinine: 1.6 mg/dL — ABNORMAL HIGH (ref 0.6–1.1)
Glucose: 101 mg/dl (ref 70–140)
Potassium: 4.1 mEq/L (ref 3.5–5.1)
Sodium: 142 mEq/L (ref 136–145)
Total Bilirubin: 0.32 mg/dL (ref 0.20–1.20)
Total Protein: 8.4 g/dL — ABNORMAL HIGH (ref 6.4–8.3)

## 2014-08-31 ENCOUNTER — Other Ambulatory Visit: Payer: Self-pay | Admitting: *Deleted

## 2014-08-31 DIAGNOSIS — C50919 Malignant neoplasm of unspecified site of unspecified female breast: Secondary | ICD-10-CM

## 2014-09-01 ENCOUNTER — Ambulatory Visit: Payer: PRIVATE HEALTH INSURANCE

## 2014-09-01 ENCOUNTER — Other Ambulatory Visit (HOSPITAL_BASED_OUTPATIENT_CLINIC_OR_DEPARTMENT_OTHER): Payer: PRIVATE HEALTH INSURANCE

## 2014-09-01 DIAGNOSIS — C50919 Malignant neoplasm of unspecified site of unspecified female breast: Secondary | ICD-10-CM

## 2014-09-01 DIAGNOSIS — C50212 Malignant neoplasm of upper-inner quadrant of left female breast: Secondary | ICD-10-CM

## 2014-09-01 LAB — COMPREHENSIVE METABOLIC PANEL (CC13)
ALBUMIN: 3.5 g/dL (ref 3.5–5.0)
ALT: 28 U/L (ref 0–55)
AST: 35 U/L — ABNORMAL HIGH (ref 5–34)
Alkaline Phosphatase: 104 U/L (ref 40–150)
Anion Gap: 7 mEq/L (ref 3–11)
BUN: 23.2 mg/dL (ref 7.0–26.0)
CO2: 27 meq/L (ref 22–29)
Calcium: 9.8 mg/dL (ref 8.4–10.4)
Chloride: 107 mEq/L (ref 98–109)
Creatinine: 1.5 mg/dL — ABNORMAL HIGH (ref 0.6–1.1)
GLUCOSE: 93 mg/dL (ref 70–140)
POTASSIUM: 4.6 meq/L (ref 3.5–5.1)
SODIUM: 141 meq/L (ref 136–145)
TOTAL PROTEIN: 7.7 g/dL (ref 6.4–8.3)
Total Bilirubin: 0.32 mg/dL (ref 0.20–1.20)

## 2014-09-01 LAB — CBC WITH DIFFERENTIAL/PLATELET
BASO%: 0.9 % (ref 0.0–2.0)
Basophils Absolute: 0.1 10*3/uL (ref 0.0–0.1)
EOS ABS: 0.1 10*3/uL (ref 0.0–0.5)
EOS%: 1.9 % (ref 0.0–7.0)
HCT: 34.6 % — ABNORMAL LOW (ref 34.8–46.6)
HGB: 11.4 g/dL — ABNORMAL LOW (ref 11.6–15.9)
LYMPH#: 1 10*3/uL (ref 0.9–3.3)
LYMPH%: 16.7 % (ref 14.0–49.7)
MCH: 30.7 pg (ref 25.1–34.0)
MCHC: 32.9 g/dL (ref 31.5–36.0)
MCV: 93.6 fL (ref 79.5–101.0)
MONO#: 0.6 10*3/uL (ref 0.1–0.9)
MONO%: 9.6 % (ref 0.0–14.0)
NEUT%: 70.9 % (ref 38.4–76.8)
NEUTROS ABS: 4.1 10*3/uL (ref 1.5–6.5)
PLATELETS: 220 10*3/uL (ref 145–400)
RBC: 3.7 10*6/uL (ref 3.70–5.45)
RDW: 13.9 % (ref 11.2–14.5)
WBC: 5.7 10*3/uL (ref 3.9–10.3)

## 2014-09-01 NOTE — Progress Notes (Signed)
Hemoglobin 11.4 today. Met pt in lobby, gave her a copy of labs and told her she did not need aranesp injection today. Pt discharged home.

## 2014-09-26 ENCOUNTER — Other Ambulatory Visit: Payer: Self-pay | Admitting: Adult Health

## 2014-09-26 DIAGNOSIS — C50919 Malignant neoplasm of unspecified site of unspecified female breast: Secondary | ICD-10-CM

## 2014-09-28 ENCOUNTER — Other Ambulatory Visit: Payer: Self-pay | Admitting: *Deleted

## 2014-09-28 DIAGNOSIS — C50919 Malignant neoplasm of unspecified site of unspecified female breast: Secondary | ICD-10-CM

## 2014-09-29 ENCOUNTER — Telehealth: Payer: Self-pay | Admitting: Hematology and Oncology

## 2014-09-29 ENCOUNTER — Other Ambulatory Visit: Payer: PRIVATE HEALTH INSURANCE

## 2014-09-29 ENCOUNTER — Other Ambulatory Visit: Payer: Self-pay | Admitting: *Deleted

## 2014-09-29 ENCOUNTER — Encounter: Payer: PRIVATE HEALTH INSURANCE | Admitting: Hematology and Oncology

## 2014-09-29 ENCOUNTER — Ambulatory Visit: Payer: PRIVATE HEALTH INSURANCE

## 2014-09-29 ENCOUNTER — Other Ambulatory Visit (HOSPITAL_BASED_OUTPATIENT_CLINIC_OR_DEPARTMENT_OTHER): Payer: PRIVATE HEALTH INSURANCE

## 2014-09-29 DIAGNOSIS — C50112 Malignant neoplasm of central portion of left female breast: Secondary | ICD-10-CM

## 2014-09-29 DIAGNOSIS — C50919 Malignant neoplasm of unspecified site of unspecified female breast: Secondary | ICD-10-CM

## 2014-09-29 LAB — COMPREHENSIVE METABOLIC PANEL (CC13)
ALT: 42 U/L (ref 0–55)
AST: 49 U/L — AB (ref 5–34)
Albumin: 3.8 g/dL (ref 3.5–5.0)
Alkaline Phosphatase: 119 U/L (ref 40–150)
Anion Gap: 11 mEq/L (ref 3–11)
BILIRUBIN TOTAL: 0.45 mg/dL (ref 0.20–1.20)
BUN: 33.3 mg/dL — ABNORMAL HIGH (ref 7.0–26.0)
CO2: 25 meq/L (ref 22–29)
CREATININE: 1.7 mg/dL — AB (ref 0.6–1.1)
Calcium: 10.2 mg/dL (ref 8.4–10.4)
Chloride: 104 mEq/L (ref 98–109)
EGFR: 33 mL/min/{1.73_m2} — ABNORMAL LOW (ref >90)
Glucose: 90 mg/dl (ref 70–140)
Potassium: 4.7 mEq/L (ref 3.5–5.1)
Sodium: 140 mEq/L (ref 136–145)
TOTAL PROTEIN: 8.1 g/dL (ref 6.4–8.3)

## 2014-09-29 LAB — CBC WITH DIFFERENTIAL/PLATELET
BASO%: 0.5 % (ref 0.0–2.0)
Basophils Absolute: 0 10*3/uL (ref 0.0–0.1)
EOS%: 1.5 % (ref 0.0–7.0)
Eosinophils Absolute: 0.1 10*3/uL (ref 0.0–0.5)
HEMATOCRIT: 34.9 % (ref 34.8–46.6)
HGB: 11.6 g/dL (ref 11.6–15.9)
LYMPH#: 1.3 10*3/uL (ref 0.9–3.3)
LYMPH%: 21.9 % (ref 14.0–49.7)
MCH: 31.1 pg (ref 25.1–34.0)
MCHC: 33.2 g/dL (ref 31.5–36.0)
MCV: 93.6 fL (ref 79.5–101.0)
MONO#: 0.5 10*3/uL (ref 0.1–0.9)
MONO%: 8.3 % (ref 0.0–14.0)
NEUT#: 4.1 10*3/uL (ref 1.5–6.5)
NEUT%: 67.8 % (ref 38.4–76.8)
Platelets: 235 10*3/uL (ref 145–400)
RBC: 3.73 10*6/uL (ref 3.70–5.45)
RDW: 13.5 % (ref 11.2–14.5)
WBC: 6 10*3/uL (ref 3.9–10.3)

## 2014-09-29 NOTE — Telephone Encounter (Signed)
per pof to sch pt appt-gave pt copy of sch-pt only had labs today-per new pof and VG to sch pt 4 weeks from today

## 2014-09-29 NOTE — Telephone Encounter (Signed)
pt arrived early and refused to wait until her appt time-adv pt appt was not until 11:45. She stated she will not wait and be aggrevated beacause we werer runnung behind-adv she was very early for appt-pt understood excepted next appt time & date per VG

## 2014-10-13 ENCOUNTER — Emergency Department (HOSPITAL_COMMUNITY): Payer: PRIVATE HEALTH INSURANCE

## 2014-10-13 ENCOUNTER — Emergency Department (HOSPITAL_COMMUNITY)
Admission: EM | Admit: 2014-10-13 | Discharge: 2014-10-13 | Disposition: A | Discharge status: Home / Self Care | Payer: PRIVATE HEALTH INSURANCE | Attending: Emergency Medicine | Admitting: Emergency Medicine

## 2014-10-13 ENCOUNTER — Encounter (HOSPITAL_COMMUNITY): Payer: Self-pay | Admitting: Cardiology

## 2014-10-13 DIAGNOSIS — R55 Syncope and collapse: Secondary | ICD-10-CM | POA: Diagnosis not present

## 2014-10-13 DIAGNOSIS — Z8659 Personal history of other mental and behavioral disorders: Secondary | ICD-10-CM | POA: Insufficient documentation

## 2014-10-13 DIAGNOSIS — Z87448 Personal history of other diseases of urinary system: Secondary | ICD-10-CM | POA: Insufficient documentation

## 2014-10-13 DIAGNOSIS — Z923 Personal history of irradiation: Secondary | ICD-10-CM | POA: Insufficient documentation

## 2014-10-13 DIAGNOSIS — R42 Dizziness and giddiness: Secondary | ICD-10-CM | POA: Diagnosis not present

## 2014-10-13 DIAGNOSIS — R110 Nausea: Secondary | ICD-10-CM | POA: Insufficient documentation

## 2014-10-13 DIAGNOSIS — Z79899 Other long term (current) drug therapy: Secondary | ICD-10-CM | POA: Diagnosis not present

## 2014-10-13 DIAGNOSIS — R079 Chest pain, unspecified: Secondary | ICD-10-CM

## 2014-10-13 DIAGNOSIS — E860 Dehydration: Secondary | ICD-10-CM | POA: Diagnosis not present

## 2014-10-13 DIAGNOSIS — D649 Anemia, unspecified: Secondary | ICD-10-CM | POA: Insufficient documentation

## 2014-10-13 DIAGNOSIS — R51 Headache: Secondary | ICD-10-CM | POA: Insufficient documentation

## 2014-10-13 DIAGNOSIS — Z8719 Personal history of other diseases of the digestive system: Secondary | ICD-10-CM | POA: Diagnosis not present

## 2014-10-13 DIAGNOSIS — Z853 Personal history of malignant neoplasm of breast: Secondary | ICD-10-CM | POA: Insufficient documentation

## 2014-10-13 DIAGNOSIS — I10 Essential (primary) hypertension: Secondary | ICD-10-CM | POA: Insufficient documentation

## 2014-10-13 DIAGNOSIS — Z87891 Personal history of nicotine dependence: Secondary | ICD-10-CM | POA: Insufficient documentation

## 2014-10-13 DIAGNOSIS — R519 Headache, unspecified: Secondary | ICD-10-CM

## 2014-10-13 LAB — I-STAT TROPONIN, ED: Troponin i, poc: 0.01 ng/mL (ref 0.00–0.08)

## 2014-10-13 LAB — URINALYSIS, ROUTINE W REFLEX MICROSCOPIC
Bilirubin Urine: NEGATIVE
GLUCOSE, UA: NEGATIVE mg/dL
Hgb urine dipstick: NEGATIVE
KETONES UR: NEGATIVE mg/dL
Nitrite: NEGATIVE
PH: 6 (ref 5.0–8.0)
Protein, ur: NEGATIVE mg/dL
Specific Gravity, Urine: 1.016 (ref 1.005–1.030)
Urobilinogen, UA: 0.2 mg/dL (ref 0.0–1.0)

## 2014-10-13 LAB — URINE MICROSCOPIC-ADD ON

## 2014-10-13 LAB — TYPE AND SCREEN
ABO/RH(D): B POS
Antibody Screen: NEGATIVE

## 2014-10-13 LAB — BASIC METABOLIC PANEL
ANION GAP: 11 (ref 5–15)
BUN: 26 mg/dL — AB (ref 6–23)
CO2: 22 mmol/L (ref 19–32)
Calcium: 9.9 mg/dL (ref 8.4–10.5)
Chloride: 105 mEq/L (ref 96–112)
Creatinine, Ser: 1.72 mg/dL — ABNORMAL HIGH (ref 0.50–1.10)
GFR, EST AFRICAN AMERICAN: 31 mL/min — AB (ref >90)
GFR, EST NON AFRICAN AMERICAN: 27 mL/min — AB (ref >90)
Glucose, Bld: 107 mg/dL — ABNORMAL HIGH (ref 70–99)
Potassium: 4.5 mmol/L (ref 3.5–5.1)
Sodium: 138 mmol/L (ref 135–145)

## 2014-10-13 LAB — CBC
HEMATOCRIT: 36.3 % (ref 36.0–46.0)
Hemoglobin: 12.2 g/dL (ref 12.0–15.0)
MCH: 31 pg (ref 26.0–34.0)
MCHC: 33.6 g/dL (ref 30.0–36.0)
MCV: 92.4 fL (ref 78.0–100.0)
Platelets: 228 10*3/uL (ref 150–400)
RBC: 3.93 MIL/uL (ref 3.87–5.11)
RDW: 13.2 % (ref 11.5–15.5)
WBC: 8.9 10*3/uL (ref 4.0–10.5)

## 2014-10-13 LAB — POC OCCULT BLOOD, ED: FECAL OCCULT BLD: POSITIVE — AB

## 2014-10-13 MED ORDER — SODIUM CHLORIDE 0.9 % IV BOLUS (SEPSIS)
1000.0000 mL | Freq: Once | INTRAVENOUS | Status: AC
  Administered 2014-10-13: 1000 mL via INTRAVENOUS

## 2014-10-13 NOTE — ED Notes (Signed)
Pt reports that she had a syncopal episode this morning while cooking. Reports that she also noticed some blood in her stool today.

## 2014-10-13 NOTE — ED Notes (Signed)
Patient transported to X-ray 

## 2014-10-13 NOTE — Discharge Instructions (Signed)
Vasovagal Syncope, Adult °Syncope, commonly known as fainting, is a temporary loss of consciousness. It occurs when the blood flow to the brain is reduced. Vasovagal syncope (also called neurocardiogenic syncope) is a fainting spell in which the blood flow to the brain is reduced because of a sudden drop in heart rate and blood pressure. Vasovagal syncope occurs when the brain and the cardiovascular system (blood vessels) do not adequately communicate and respond to each other. This is the most common cause of fainting. It often occurs in response to fear or some other type of emotional or physical stress. The body has a reaction in which the heart starts beating too slowly or the blood vessels expand, reducing blood pressure. This type of fainting spell is generally considered harmless. However, injuries can occur if a person takes a sudden fall during a fainting spell.  °CAUSES  °Vasovagal syncope occurs when a person's blood pressure and heart rate decrease suddenly, usually in response to a trigger. Many things and situations can trigger an episode. Some of these include:  °· Pain.   °· Fear.   °· The sight of blood or medical procedures, such as blood being drawn from a vein.   °· Common activities, such as coughing, swallowing, stretching, or going to the bathroom.   °· Emotional stress.   °· Prolonged standing, especially in a warm environment.   °· Lack of sleep or rest.   °· Prolonged lack of food.   °· Prolonged lack of fluids.   °· Recent illness. °· The use of certain drugs that affect blood pressure, such as cocaine, alcohol, marijuana, inhalants, and opiates.   °SYMPTOMS  °Before the fainting episode, you may:  °· Feel dizzy or light headed.   °· Become pale. °· Sense that you are going to faint.   °· Feel like the room is spinning.   °· Have tunnel vision, only seeing directly in front of you.   °· Feel sick to your stomach (nauseous).   °· See spots or slowly lose vision.   °· Hear ringing in your  ears.   °· Have a headache.   °· Feel warm and sweaty.   °· Feel a sensation of pins and needles. °During the fainting spell, you will generally be unconscious for no longer than a couple minutes before waking up and returning to normal. If you get up too quickly before your body can recover, you may faint again. Some twitching or jerky movements may occur during the fainting spell.  °DIAGNOSIS  °Your caregiver will ask about your symptoms, take a medical history, and perform a physical exam. Various tests may be done to rule out other causes of fainting. These may include blood tests and tests to check the heart, such as electrocardiography, echocardiography, and possibly an electrophysiology study. When other causes have been ruled out, a test may be done to check the body's response to changes in position (tilt table test). °TREATMENT  °Most cases of vasovagal syncope do not require treatment. Your caregiver may recommend ways to avoid fainting triggers and may provide home strategies for preventing fainting. If you must be exposed to a possible trigger, you can drink additional fluids to help reduce your chances of having an episode of vasovagal syncope. If you have warning signs of an oncoming episode, you can respond by positioning yourself favorably (lying down). °If your fainting spells continue, you may be given medicines to prevent fainting. Some medicines may help make you more resistant to repeated episodes of vasovagal syncope. Special exercises or compression stockings may be recommended. In rare cases, the surgical placement   of a pacemaker is considered. °HOME CARE INSTRUCTIONS  °· Learn to identify the warning signs of vasovagal syncope.   °· Sit or lie down at the first warning sign of a fainting spell. If sitting, put your head down between your legs. If you lie down, swing your legs up in the air to increase blood flow to the brain.   °· Avoid hot tubs and saunas. °· Avoid prolonged  standing. °· Drink enough fluids to keep your urine clear or pale yellow. Avoid caffeine. °· Increase salt in your diet as directed by your caregiver.   °· If you have to stand for a long time, perform movements such as:   °¨ Crossing your legs.   °¨ Flexing and stretching your leg muscles.   °¨ Squatting.   °¨ Moving your legs.   °¨ Bending over.   °· Only take over-the-counter or prescription medicines as directed by your caregiver. Do not suddenly stop any medicines without asking your caregiver first.  °SEEK MEDICAL CARE IF:  °· Your fainting spells continue or happen more frequently in spite of treatment.   °· You lose consciousness for more than a couple minutes. °· You have fainting spells during or after exercising or after being startled.   °· You have new symptoms that occur with the fainting spells, such as:   °¨ Shortness of breath. °¨ Chest pain.   °¨ Irregular heartbeat.   °· You have episodes of twitching or jerky movements that last longer than a few seconds. °· You have episodes of twitching or jerky movements without obvious fainting. °SEEK IMMEDIATE MEDICAL CARE IF:  °· You have injuries or bleeding after a fainting spell.   °· You have episodes of twitching or jerky movements that last longer than 5 minutes.   °· You have more than one spell of twitching or jerky movements before returning to consciousness after fainting. °MAKE SURE YOU:  °· Understand these instructions. °· Will watch your condition. °· Will get help right away if you are not doing well or get worse. °Document Released: 09/23/2012 Document Reviewed: 09/23/2012 °[employer]® Patient Information ©2015 [employer]. This information is not intended to replace advice given to you by your health care provider. Make sure you discuss any questions you have with your health care provider. ° °

## 2014-10-13 NOTE — ED Notes (Signed)
Pt returned from X-ray.  

## 2014-10-13 NOTE — ED Provider Notes (Signed)
CSN: [id number]     Arrival date & time 10/13/14  1134 History   First MD Initiated Contact with Patient 10/13/14 1150     Chief Complaint  Patient presents with  . Loss of Consciousness    [name] is a 80 y.o. female with history of hypertension, anemia of chronic disease and breast cancer who presents to emergency department after a syncopal episode while sitting on her couch today. Patient reports she was sitting on her sofa when she felt like she was having a hot flash passed out around 4 hours prior to arrival. She reports prior to her syncopal episode she had a bowel movement and started feeling "weird" while on the toilet. Patient reports similar syncopal episodes while sitting on the toilet. Patient reports she had some nausea after this episode, this is since resolved. Patient reports a slight headache at 3 out of 10 currently. Patient reports she's had loose stools for the past year and reports some bright red blood when wiping yesterday. Patient reports she had similar syncopal episode about 4 months ago while sitting on the toilet. The patient denies fevers, chills, vomiting, diarrhea, abdominal pain, dysuria, hematuria, changes to her vision, changes to her balance, loss of bowel or bladder control, chest pain, palpitations, numbness, or tingling. Patient denies personal or family history of cardiovascular disease. The patient has personal or family history of DVT or PEs.  (Consider location/radiation/quality/duration/timing/severity/associated sxs/prior Treatment) HPI  Past Medical History  Diagnosis Date  . Anemia of chronic disease   . Anxiety   . Hypertension   . Hepatitis   . Lipid disorder   . Wears glasses   . Renal insufficiency   . Wears dentures     top  . Breast cancer 08/28/12    IDC left breast bx=invasive ductal ca,ER/PR=+  . Breast cancer 09/01/2012  . Radiation 11/12/12-12/15/11    Left Breast/50 Gy   Past Surgical History  Procedure Laterality  Date  . Abdominal hysterectomy       approx 20 years ago  . Appendectomy      20 years ago  . Foot surgery  1992    bone spurs both feet  . Colonoscopy  2009?  [name] Partial mastectomy with needle localization and axillary sentinel lymph node bx  10/08/2012    Procedure: PARTIAL MASTECTOMY WITH NEEDLE LOCALIZATION AND AXILLARY SENTINEL LYMPH NODE BX;  Surgeon: Adin Hector, MD;  Location: [place];  Service: General;  Laterality: Left;  Left partial mastectomy with Needle localization and Sentinel lymph node biospy  . Enteroscopy N/A 11/08/2013    Procedure: ENTEROSCOPY;  Surgeon: Beryle Beams, MD;  Location: [place];  Service: Endoscopy;  Laterality: N/A;  . Bones spur      removed 20 years ago  . Enteroscopy N/A 04/18/2014    Procedure: ENTEROSCOPY;  Surgeon: Beryle Beams, MD;  Location: [place];  Service: Endoscopy;  Laterality: N/A;   Family History  Problem Relation Age of Onset  . Cancer Father   . Prostate cancer Father   . Breast cancer Cousin   . Tuberculosis Mother    History  Substance Use Topics  . Smoking status: Former Smoker -- 1.00 packs/day    Types: Cigarettes    Quit date: 10/27/2007  . Smokeless tobacco: Never Used     Comment: started smoking age 89  . Alcohol Use: 0.6 oz/week    1 Glasses of wine per week     Comment:  glass  wine per week   OB History    No data available     Review of Systems  Constitutional: Negative for fever and chills.  HENT: Negative for congestion, ear pain, sore throat and trouble swallowing.   Eyes: Negative for pain and visual disturbance.  Respiratory: Negative for cough, shortness of breath and wheezing.   Cardiovascular: Negative for chest pain and palpitations.  Gastrointestinal: Positive for nausea. Negative for vomiting, abdominal pain and diarrhea.  Genitourinary: Negative for dysuria, hematuria and difficulty urinating.  Musculoskeletal: Negative for back pain, neck pain and neck  stiffness.  Skin: Negative for rash and wound.  Neurological: Positive for syncope, light-headedness and headaches. Negative for seizures and facial asymmetry.  All other systems reviewed and are negative.     Allergies  Review of patient's allergies indicates no known allergies.  Home Medications   Prior to Admission medications   Medication Sig Start Date End Date Taking? Authorizing Provider  Amlodipine-Valsartan-HCTZ (EXFORGE HCT) 5-160-12.5 MG TABS Take 1 tablet by mouth daily.    Historical Provider, MD  anastrozole (ARIMIDEX) 1 MG tablet TAKE 1 TABLET BY MOUTH DAILY 09/26/14   Rulon Eisenmenger, MD  calcium carbonate (OS-CAL) 600 MG TABS Take 600 mg by mouth daily.    Historical Provider, MD  Cholecalciferol (VITAMIN D-3) 1000 UNITS CAPS Take 1,000 Units by mouth daily.    Historical Provider, MD  diclofenac sodium (VOLTAREN) 1 % GEL Apply 2 g topically 3 (three) times daily as needed. For arthritis pain    Historical Provider, MD  fluticasone (FLONASE) 50 MCG/ACT nasal spray Place 2 sprays into both nostrils daily as needed for allergies or rhinitis. 11/08/13   Ripudeep Krystal Eaton, MD  folic acid (FOLVITE) 119 MCG tablet Take 400-800 mcg by mouth daily.    Historical Provider, MD  loratadine (CLARITIN) 10 MG tablet Take 1 tablet (10 mg total) by mouth daily as needed for allergies or rhinitis. 11/08/13   Ripudeep Krystal Eaton, MD  Omega-3 Fatty Acids (FISH OIL) 500 MG CAPS Take 1 capsule by mouth daily.    Historical Provider, MD  omeprazole (PRILOSEC) 20 MG capsule Take 1 capsule (20 mg total) by mouth daily. 11/08/13   Ripudeep Krystal Eaton, MD  vitamin C (ASCORBIC ACID) 500 MG tablet Take 500 mg by mouth daily.    Historical Provider, MD   BP 118/50 mmHg  Pulse 73  Temp(Src) 97.5 F (36.4 C) (Oral)  Resp 20  Ht 5\' 2"  (1.575 m)  Wt 132 lb (59.875 kg)  BMI 24.14 kg/m2  SpO2 100% Physical Exam  Constitutional: She is oriented to person, place, and time. She appears well-developed and  well-nourished. No distress.  HENT:  Head: Normocephalic and atraumatic.  Right Ear: External ear normal.  Left Ear: External ear normal.  Nose: Nose normal.  Mouth/Throat: Oropharynx is clear and moist. No oropharyngeal exudate.  No temporal tenderness or edema.   Eyes: Conjunctivae and EOM are normal. Pupils are equal, round, and reactive to light. Right eye exhibits no discharge. Left eye exhibits no discharge.  Neck: Normal range of motion. Neck supple.  Cardiovascular: Normal rate, regular rhythm, normal heart sounds and intact distal pulses.  Exam reveals no gallop and no friction rub.   No murmur heard. Bilateral radial pulses are intact. Bilateral posterior tibialis pulses are intact.  Pulmonary/Chest: Effort normal and breath sounds normal. No respiratory distress. She has no wheezes. She has no rales.  Abdominal: Soft. Bowel sounds are normal. She exhibits no distension  and no mass. There is no tenderness. There is no rebound and no guarding.  Musculoskeletal: She exhibits no edema.  Patient's strength is 5 out of 5 in bilateral upper and lower extremities. Patient is able ambulate without difficulty or assistance.  Lymphadenopathy:    She has no cervical adenopathy.  Neurological: She is alert and oriented to person, place, and time. No cranial nerve deficit. Coordination normal.  Cranial nerves II through XII are intact bilaterally. Bilateral finger to nose intact. Bilateral rapid alternating movements are intact. No pronator drift. No facial droop. Speech is clear and coherent. Grip strength is good bilaterally.  Skin: Skin is warm and dry. No rash noted. She is not diaphoretic. No erythema. No pallor.  Psychiatric: She has a normal mood and affect. Her behavior is normal.  Nursing note and vitals reviewed.   ED Course  Procedures (including critical care time) Labs Review Labs Reviewed  BASIC METABOLIC PANEL - Abnormal; Notable for the following:    Glucose, Bld 107 (*)     BUN 26 (*)    Creatinine, Ser 1.72 (*)    GFR calc non Af Amer 27 (*)    GFR calc Af Amer 31 (*)    All other components within normal limits  URINALYSIS, ROUTINE W REFLEX MICROSCOPIC - Abnormal; Notable for the following:    Leukocytes, UA TRACE (*)    All other components within normal limits  URINE MICROSCOPIC-ADD ON - Abnormal; Notable for the following:    Squamous Epithelial / LPF FEW (*)    Bacteria, UA FEW (*)    Casts HYALINE CASTS (*)    All other components within normal limits  POC OCCULT BLOOD, ED - Abnormal; Notable for the following:    Fecal Occult Bld POSITIVE (*)    All other components within normal limits  CBC  OCCULT BLOOD X 1 CARD TO LAB, STOOL  I-STAT TROPOININ, ED  I-STAT TROPOININ, ED  TYPE AND SCREEN    Imaging Review Dg Chest 2 View  10/13/2014   CLINICAL DATA:  Chest pain today. Blood in stool and syncopal episode. Initial encounter.  EXAM: CHEST  2 VIEW  COMPARISON:  Radiographs 04/16/2014 and 03/06/2014.  FINDINGS: The heart size and mediastinal contours are stable. There is diffuse aortic atherosclerosis. Postsurgical changes in the left breast and left upper lobe scarring are stable. There is no airspace disease or pleural effusion. The osseous structures appear stable.  IMPRESSION: Stable chronic findings related to treated breast cancer. No acute cardiopulmonary process.   Electronically Signed   By: Camie Patience M.D.   On: 10/13/2014 14:20     EKG Interpretation   Date/Time:  Thursday October 13 2014 11:40:51 EST Ventricular Rate:  73 PR Interval:  172 QRS Duration: 78 QT Interval:  390 QTC Calculation: 429 R Axis:     Text Interpretation:  Normal sinus rhythm Normal ECG Confirmed by DOCHERTY   MD, MEGAN ([id number]) on 10/13/2014 11:42:00 AM      MDM   Meds given in ED:  Medications  sodium chloride 0.9 % bolus 1,000 mL (0 mLs Intravenous Stopped 10/13/14 1532)    Discharge Medication List as of 10/13/2014  3:12 PM      Final  diagnoses:  Headache  Syncope  Dehydration    [name] is a 80 y.o. female with history of hypertension, anemia of chronic disease and breast cancer who presents to emergency department after a syncopal episode while sitting on her couch today. The patient  reports she started feeling weird while having a bowel movement just prior to her syncopal episode. Patient reports she's had a couple of these single episodes while having bowel movements. Patient reports she drinks very little water throughout the day. Patient initially had a headache and some nausea but this resolved and CT was canceled. Patient is afebrile and nontoxic appearing. Patient reports some bright red blood when wiping after bowel movement today. Patient has Hemoccult-positive this is likely due to hemorrhoids. Patient is not anemic. Patient's urinalysis is unremarkable. Patient had a negative troponin. The patient's CBC is unremarkable.  The patient's BMP showed some slightly worsening kidney function.  Patient's chest x-ray was negative for acute findings. Patient reports feeling much improved after fluid bolus. She reports her headache and nausea have completely resolved. Patient is able to ambulate in the room without difficulty or assistance. She denies feeling lightheaded or dizzy. Prior to discharge the patient's blood pressure is 118/50. I advised the patient to rest today and to increase her fluid intake daily. I advised the patient to follow-up with her PCP for her syncopal episodes. The patient's son will be staying with her for the next few days. Advised patient return to the emergency department new or worsening symptoms or new concerns. Patient verbalized understanding and agreement with plan.   This patient was discussed with and evaluated by Dr. Rulon Abide who agrees with assessment and plan.     Hanley Hays, PA-C 10/13/14 1711  Ernestina Patches, MD 10/13/14 2024

## 2014-10-26 ENCOUNTER — Other Ambulatory Visit: Payer: Self-pay | Admitting: *Deleted

## 2014-10-26 DIAGNOSIS — C50919 Malignant neoplasm of unspecified site of unspecified female breast: Secondary | ICD-10-CM

## 2014-10-27 ENCOUNTER — Other Ambulatory Visit (HOSPITAL_BASED_OUTPATIENT_CLINIC_OR_DEPARTMENT_OTHER): Payer: Medicare Other

## 2014-10-27 ENCOUNTER — Ambulatory Visit: Payer: Medicare Other

## 2014-10-27 ENCOUNTER — Other Ambulatory Visit: Payer: Self-pay | Admitting: Hematology and Oncology

## 2014-10-27 ENCOUNTER — Ambulatory Visit (HOSPITAL_BASED_OUTPATIENT_CLINIC_OR_DEPARTMENT_OTHER): Payer: Medicare Other | Admitting: Hematology and Oncology

## 2014-10-27 ENCOUNTER — Telehealth: Payer: Self-pay | Admitting: Hematology and Oncology

## 2014-10-27 VITALS — BP 139/43 | HR 70 | Temp 98.0°F | Resp 18 | Ht 62.0 in | Wt 135.8 lb

## 2014-10-27 DIAGNOSIS — C50912 Malignant neoplasm of unspecified site of left female breast: Secondary | ICD-10-CM

## 2014-10-27 DIAGNOSIS — N189 Chronic kidney disease, unspecified: Secondary | ICD-10-CM

## 2014-10-27 DIAGNOSIS — C50919 Malignant neoplasm of unspecified site of unspecified female breast: Secondary | ICD-10-CM

## 2014-10-27 DIAGNOSIS — N63 Unspecified lump in unspecified breast: Secondary | ICD-10-CM

## 2014-10-27 DIAGNOSIS — D631 Anemia in chronic kidney disease: Secondary | ICD-10-CM | POA: Diagnosis not present

## 2014-10-27 DIAGNOSIS — D638 Anemia in other chronic diseases classified elsewhere: Secondary | ICD-10-CM

## 2014-10-27 LAB — COMPREHENSIVE METABOLIC PANEL (CC13)
ALBUMIN: 3.7 g/dL (ref 3.5–5.0)
ALT: 27 U/L (ref 0–55)
AST: 32 U/L (ref 5–34)
Alkaline Phosphatase: 117 U/L (ref 40–150)
Anion Gap: 9 mEq/L (ref 3–11)
BUN: 24.4 mg/dL (ref 7.0–26.0)
CALCIUM: 10.5 mg/dL — AB (ref 8.4–10.4)
CHLORIDE: 103 meq/L (ref 98–109)
CO2: 27 mEq/L (ref 22–29)
Creatinine: 1.5 mg/dL — ABNORMAL HIGH (ref 0.6–1.1)
EGFR: 38 mL/min/{1.73_m2} — AB (ref >90)
Glucose: 83 mg/dl (ref 70–140)
Potassium: 4.6 mEq/L (ref 3.5–5.1)
Sodium: 138 mEq/L (ref 136–145)
TOTAL PROTEIN: 7.9 g/dL (ref 6.4–8.3)
Total Bilirubin: 0.34 mg/dL (ref 0.20–1.20)

## 2014-10-27 LAB — CBC WITH DIFFERENTIAL/PLATELET
BASO%: 1.4 % (ref 0.0–2.0)
Basophils Absolute: 0.1 10*3/uL (ref 0.0–0.1)
EOS%: 1.9 % (ref 0.0–7.0)
Eosinophils Absolute: 0.1 10*3/uL (ref 0.0–0.5)
HCT: 35.4 % (ref 34.8–46.6)
HEMOGLOBIN: 11.4 g/dL — AB (ref 11.6–15.9)
LYMPH%: 15.1 % (ref 14.0–49.7)
MCH: 30.5 pg (ref 25.1–34.0)
MCHC: 32.3 g/dL (ref 31.5–36.0)
MCV: 94.6 fL (ref 79.5–101.0)
MONO#: 0.6 10*3/uL (ref 0.1–0.9)
MONO%: 10 % (ref 0.0–14.0)
NEUT%: 71.6 % (ref 38.4–76.8)
NEUTROS ABS: 4.5 10*3/uL (ref 1.5–6.5)
Platelets: 252 10*3/uL (ref 145–400)
RBC: 3.74 10*6/uL (ref 3.70–5.45)
RDW: 13.1 % (ref 11.2–14.5)
WBC: 6.2 10*3/uL (ref 3.9–10.3)
lymph#: 0.9 10*3/uL (ref 0.9–3.3)

## 2014-10-27 MED ORDER — ANASTROZOLE 1 MG PO TABS: 1.0000 mg | ORAL_TABLET | Freq: Every day | ORAL | Status: DC

## 2014-10-27 NOTE — Assessment & Plan Note (Addendum)
Anemia due to chronic kidney disease: Previously received Aranesp treatments every [redacted] weeks along with iron, currently her hemoglobins have been excellent over the past 6 months and has not required any Aranesp treatments. I do not think she will need any in the near future. We do not have to check her blood counts every single month. After the next months follow-up visit I will set her up for every 3 month blood count checks.

## 2014-10-27 NOTE — Assessment & Plan Note (Signed)
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  Breast cancer surveillance: 1. Mammograms February 2015 revealed benign-appearing intramammary lymph node in the right side was evaluated by ultrasound and felt to be not concerning 2. Physical examination 10/27/2014: Palpable abnormality in the left breast at the lateral end of the previous scar tissue. I would like to send her up for diagnostic mammograms and evaluation of the breast center. I discussed with her that if they find an abnormality that would biopsy it. I would like to see her back in a month to evaluate the abnormality.  Arimidex toxicities: Patient does not have any major toxicities from Arimidex. Her bone density test will need to be scheduled for July 2016. I sent her a prescription for 1 years supply of Arimidex therapy.

## 2014-10-27 NOTE — Progress Notes (Signed)
Patient Care Team: Lucianne Lei, MD as PCP - General (Family Medicine)  DIAGNOSIS: Stage II left breast cancer; anemia of chronic kidney disease  SUMMARY OF ONCOLOGIC HISTORY:   Breast cancer, left breast   08/25/2012 Initial Diagnosis Breast cancer, left breast: Invasive ductal carcinoma grade 1-2 ER/PR positive HER-2 negative Ki-67 14%   10/08/2012 Surgery Left lumpectomy and sentinel lymph node biopsy: 2.1 cm invasive ductal carcinoma 3 sentinel lymph nodes negative posterior margin reexcision ER positive PR positive HER-2 negative Ki-67 14% grade 1-2   11/10/2012 - 12/11/2012 Radiation Therapy Adjuvant radiation therapy   12/28/2012 -  Anti-estrogen oral therapy Arimidex 1 mg daily    CHIEF COMPLIANT: Left breast lump  INTERVAL HISTORY: [name] is a 81 year old lady with above-mentioned history of left-sided breast cancer treated with lumpectomy and radiation currently on Arimidex therapy is here for routine follow-up. She is reporting a new palpable left breast lump the end of the left breast scar. She denies any pain or difficulties with it. She is also being followed by our practice for anemia related to chronic kidney disease previously received Aranesp treatments. She has not required any such treatment over the past 6 months. She is otherwise feeling well from the anemia standpoint.  REVIEW OF SYSTEMS:   Constitutional: Denies fevers, chills or abnormal weight loss Eyes: Denies blurriness of vision Ears, nose, mouth, throat, and face: Denies mucositis or sore throat Respiratory: Denies cough, dyspnea or wheezes Cardiovascular: Denies palpitation, chest discomfort or lower extremity swelling Gastrointestinal:  Denies nausea, heartburn or change in bowel habits Skin: Denies abnormal skin rashes Lymphatics: Denies new lymphadenopathy or easy bruising Neurological:Denies numbness, tingling or new weaknesses Behavioral/Psych: Mood is stable, no new changes  Breast: Left breast  lump All other systems were reviewed with the patient and are negative.  I have reviewed the past medical history, past surgical history, social history and family history with the patient and they are unchanged from previous note.  ALLERGIES:  has No Known Allergies.  MEDICATIONS:  Current Outpatient Prescriptions  Medication Sig Dispense Refill  . Amlodipine-Valsartan-HCTZ (EXFORGE HCT) 5-160-12.5 MG TABS Take 1 tablet by mouth daily.    [name] anastrozole (ARIMIDEX) 1 MG tablet Take 1 tablet (1 mg total) by mouth daily. 90 tablet 3  . calcium carbonate (OS-CAL) 600 MG TABS Take 600 mg by mouth daily.    . Cholecalciferol (VITAMIN D-3) 1000 UNITS CAPS Take 1,000 Units by mouth daily.    . diclofenac sodium (VOLTAREN) 1 % GEL Apply 2 g topically 3 (three) times daily as needed. For arthritis pain    . fluticasone (FLONASE) 50 MCG/ACT nasal spray Place 2 sprays into both nostrils daily as needed for allergies or rhinitis. 16 g 2  . folic acid (FOLVITE) 734 MCG tablet Take 400-800 mcg by mouth daily.    [name] loratadine (CLARITIN) 10 MG tablet Take 1 tablet (10 mg total) by mouth daily as needed for allergies or rhinitis. 30 tablet 3  . Omega-3 Fatty Acids (FISH OIL) 500 MG CAPS Take 1 capsule by mouth daily.    [name] omeprazole (PRILOSEC) 20 MG capsule Take 1 capsule (20 mg total) by mouth daily. 30 capsule 3  . vitamin C (ASCORBIC ACID) 500 MG tablet Take 500 mg by mouth daily.     No current facility-administered medications for this visit.    PHYSICAL EXAMINATION: ECOG PERFORMANCE STATUS: 0 - Asymptomatic  Filed Vitals:   10/27/14 0940  BP: 139/43  Pulse: 70  Temp: 98 F (  36.7 C)  Resp: 18   Filed Weights   10/27/14 0940  Weight: 135 lb 12.8 oz (61.598 kg)    GENERAL:alert, no distress and comfortable SKIN: skin color, texture, turgor are normal, no rashes or significant lesions EYES: normal, Conjunctiva are pink and non-injected, sclera clear OROPHARYNX:no exudate, no erythema and  lips, buccal mucosa, and tongue normal  NECK: supple, thyroid normal size, non-tender, without nodularity LYMPH:  no palpable lymphadenopathy in the cervical, axillary or inguinal LUNGS: clear to auscultation and percussion with normal breathing effort HEART: regular rate & rhythm and no murmurs and no lower extremity edema ABDOMEN:abdomen soft, non-tender and normal bowel sounds Musculoskeletal:no cyanosis of digits and no clubbing  NEURO: alert & oriented x 3 with fluent speech, no focal motor/sensory deficits BREAST: Palpable mass in the left breast at the lateral end of the previous breast surgery scar No palpable axillary supraclavicular or infraclavicular adenopathy no breast tenderness or nipple discharge.   LABORATORY DATA:  I have reviewed the data as listed   Chemistry      Component Value Date/Time   NA 138 10/27/2014 0920   NA 138 10/13/2014 1210   NA 138 04/12/2010 0954   K 4.6 10/27/2014 0920   K 4.5 10/13/2014 1210   K 4.8* 04/12/2010 0954   CL 105 10/13/2014 1210   CL 105 12/28/2012 1059   CL 101 04/12/2010 0954   CO2 27 10/27/2014 0920   CO2 22 10/13/2014 1210   CO2 26 04/12/2010 0954   BUN 24.4 10/27/2014 0920   BUN 26* 10/13/2014 1210   BUN 14 04/12/2010 0954   CREATININE 1.5* 10/27/2014 0920   CREATININE 1.72* 10/13/2014 1210   CREATININE 1.2 04/12/2010 0954      Component Value Date/Time   CALCIUM 10.5* 10/27/2014 0920   CALCIUM 9.9 10/13/2014 1210   CALCIUM 9.8 04/12/2010 0954   ALKPHOS 117 10/27/2014 0920   ALKPHOS 121* 07/17/2014 1428   ALKPHOS 106* 04/12/2010 0954   AST 32 10/27/2014 0920   AST 39* 07/17/2014 1428   AST 80* 04/12/2010 0954   ALT 27 10/27/2014 0920   ALT 32 07/17/2014 1428   ALT 69* 04/12/2010 0954   BILITOT 0.34 10/27/2014 0920   BILITOT 0.3 07/17/2014 1428   BILITOT 0.50 04/12/2010 0954       Lab Results  Component Value Date   WBC 6.2 10/27/2014   HGB 11.4* 10/27/2014   HCT 35.4 10/27/2014   MCV 94.6 10/27/2014    PLT 252 10/27/2014   NEUTROABS 4.5 10/27/2014    ASSESSMENT & PLAN:  Anemia of chronic disease Anemia due to chronic kidney disease: Previously received Aranesp treatments every [redacted] weeks along with iron, currently her hemoglobins have been excellent over the past 6 months and has not required any Aranesp treatments. I do not think she will need any in the near future. We do not have to check her blood counts every single month. After the next months follow-up visit I will set her up for every 3 month blood count checks.  Breast cancer, left breast Left breast invasive ductal carcinoma T2 N0 M0 stage II a 2.1 cm grade 1 ER/PR positive HER-2 negative diagnosed November 2013 status post lumpectomy and radiation currently on oral Arimidex started March 2014.  Breast cancer surveillance: 1. Mammograms February 2015 revealed benign-appearing intramammary lymph node in the right side was evaluated by ultrasound and felt to be not concerning 2. Physical examination 10/27/2014: Palpable abnormality in the left breast at the  lateral end of the previous scar tissue. I would like to send her up for diagnostic mammograms and evaluation of the breast center. I discussed with her that if they find an abnormality that would biopsy it. I would like to see her back in a month to evaluate the abnormality.  Arimidex toxicities: Patient does not have any major toxicities from Arimidex. Her bone density test will need to be scheduled for July 2016. I sent her a prescription for 1 years supply of Arimidex therapy.   Anemia of chronic kidney disease:Anemia due to chronic kidney disease: Previously received Aranesp treatments every [redacted] weeks along with iron, currently her hemoglobins have been excellent over the past 6 months and has not required any Aranesp treatments. I do not think she will need any in the near future. We do not have to check her blood counts every single month. After the next months follow-up visit I will  set her up for every 3 month blood count checks.   Orders Placed This Encounter  Procedures  . MM Digital Diagnostic Bilat    Standing Status: Future     Number of Occurrences:      Standing Expiration Date: 10/27/2015    Order Specific Question:  Reason for Exam (SYMPTOM  OR DIAGNOSIS REQUIRED)    Answer:  H/O breast cancer now has a palpable lump in the left breast    Order Specific Question:  Preferred imaging location?    Answer:  [facility]   The patient has a good understanding of the overall plan. she agrees with it. She will call with any problems that may develop before her next visit here.   Rulon Eisenmenger, MD 10/27/2014 10:21 AM

## 2014-10-27 NOTE — Telephone Encounter (Signed)
, °

## 2014-11-03 ENCOUNTER — Ambulatory Visit
Admission: RE | Admit: 2014-11-03 | Discharge: 2014-11-03 | Disposition: A | Discharge status: Home / Self Care | Payer: Medicare Other | Source: Ambulatory Visit | Attending: Hematology and Oncology | Admitting: Hematology and Oncology

## 2014-11-03 DIAGNOSIS — N63 Unspecified lump in unspecified breast: Secondary | ICD-10-CM

## 2014-11-03 DIAGNOSIS — R921 Mammographic calcification found on diagnostic imaging of breast: Secondary | ICD-10-CM | POA: Diagnosis not present

## 2014-11-03 DIAGNOSIS — C50912 Malignant neoplasm of unspecified site of left female breast: Secondary | ICD-10-CM

## 2014-11-03 DIAGNOSIS — Z853 Personal history of malignant neoplasm of breast: Secondary | ICD-10-CM | POA: Diagnosis not present

## 2014-11-23 ENCOUNTER — Other Ambulatory Visit: Payer: Self-pay | Admitting: *Deleted

## 2014-11-23 DIAGNOSIS — C50912 Malignant neoplasm of unspecified site of left female breast: Secondary | ICD-10-CM

## 2014-11-24 ENCOUNTER — Other Ambulatory Visit (HOSPITAL_BASED_OUTPATIENT_CLINIC_OR_DEPARTMENT_OTHER): Payer: Medicare Other

## 2014-11-24 ENCOUNTER — Ambulatory Visit (HOSPITAL_BASED_OUTPATIENT_CLINIC_OR_DEPARTMENT_OTHER): Payer: Medicare Other | Admitting: Hematology and Oncology

## 2014-11-24 ENCOUNTER — Telehealth: Payer: Self-pay | Admitting: Hematology and Oncology

## 2014-11-24 VITALS — BP 139/62 | HR 76 | Temp 97.8°F | Resp 18 | Ht 62.0 in | Wt 137.1 lb

## 2014-11-24 DIAGNOSIS — N189 Chronic kidney disease, unspecified: Secondary | ICD-10-CM

## 2014-11-24 DIAGNOSIS — D631 Anemia in chronic kidney disease: Secondary | ICD-10-CM

## 2014-11-24 DIAGNOSIS — C50912 Malignant neoplasm of unspecified site of left female breast: Secondary | ICD-10-CM

## 2014-11-24 DIAGNOSIS — Z170 Estrogen receptor positive status [ER+]: Secondary | ICD-10-CM

## 2014-11-24 DIAGNOSIS — C50112 Malignant neoplasm of central portion of left female breast: Secondary | ICD-10-CM | POA: Diagnosis not present

## 2014-11-24 DIAGNOSIS — D638 Anemia in other chronic diseases classified elsewhere: Secondary | ICD-10-CM

## 2014-11-24 LAB — CBC WITH DIFFERENTIAL/PLATELET
BASO%: 0.2 % (ref 0.0–2.0)
Basophils Absolute: 0 10*3/uL (ref 0.0–0.1)
EOS%: 2.2 % (ref 0.0–7.0)
Eosinophils Absolute: 0.1 10*3/uL (ref 0.0–0.5)
HCT: 35.9 % (ref 34.8–46.6)
HGB: 11.8 g/dL (ref 11.6–15.9)
LYMPH%: 21.1 % (ref 14.0–49.7)
MCH: 30.6 pg (ref 25.1–34.0)
MCHC: 32.9 g/dL (ref 31.5–36.0)
MCV: 93.2 fL (ref 79.5–101.0)
MONO#: 0.6 10*3/uL (ref 0.1–0.9)
MONO%: 11 % (ref 0.0–14.0)
NEUT%: 65.5 % (ref 38.4–76.8)
NEUTROS ABS: 3.3 10*3/uL (ref 1.5–6.5)
Platelets: 238 10*3/uL (ref 145–400)
RBC: 3.85 10*6/uL (ref 3.70–5.45)
RDW: 13 % (ref 11.2–14.5)
WBC: 5 10*3/uL (ref 3.9–10.3)
lymph#: 1.1 10*3/uL (ref 0.9–3.3)

## 2014-11-24 LAB — COMPREHENSIVE METABOLIC PANEL (CC13)
ALK PHOS: 107 U/L (ref 40–150)
ALT: 25 U/L (ref 0–55)
ANION GAP: 11 meq/L (ref 3–11)
AST: 31 U/L (ref 5–34)
Albumin: 3.7 g/dL (ref 3.5–5.0)
BUN: 22.4 mg/dL (ref 7.0–26.0)
CO2: 24 meq/L (ref 22–29)
Calcium: 9.8 mg/dL (ref 8.4–10.4)
Chloride: 106 mEq/L (ref 98–109)
Creatinine: 1.4 mg/dL — ABNORMAL HIGH (ref 0.6–1.1)
EGFR: 41 mL/min/{1.73_m2} — AB (ref >90)
Glucose: 81 mg/dl (ref 70–140)
Potassium: 4.7 mEq/L (ref 3.5–5.1)
Sodium: 141 mEq/L (ref 136–145)
Total Bilirubin: 0.33 mg/dL (ref 0.20–1.20)
Total Protein: 8 g/dL (ref 6.4–8.3)

## 2014-11-24 NOTE — Telephone Encounter (Signed)
per pof to sch pt appt-sch DEXA-gave pt copy of sch °

## 2014-11-24 NOTE — Assessment & Plan Note (Signed)
Anemia of chronic kidney disease: Patient does not need erythropoietin supplementation anymore. Her hemoglobin today is 11.8. Patient is completely asymptomatic. 6 month follow-up with recheck of labs.

## 2014-11-24 NOTE — Assessment & Plan Note (Signed)
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  Breast cancer surveillance: 1. Mammograms February 2015 revealed benign-appearing intramammary lymph node in the right side was evaluated by ultrasound and felt to be not concerning 2. Physical examination 10/27/2014: Palpable abnormality in the left breast at the lateral end of the previous scar tissue. This was evaluated by mammogram and ultrasound 11/03/2014 and was found to be benign area  Arimidex toxicities: Patient does not have any major toxicities from Arimidex. Her bone density test will need to be scheduled for July 2016.  Return to clinic in 6 months for follow-up

## 2014-11-24 NOTE — Progress Notes (Signed)
Patient Care Team: Lucianne Lei, MD as PCP - General (Family Medicine)  DIAGNOSIS: No matching staging information was found for the patient.  SUMMARY OF ONCOLOGIC HISTORY:   Breast cancer, left breast   08/25/2012 Initial Diagnosis Breast cancer, left breast: Invasive ductal carcinoma grade 1-2 ER/PR positive HER-2 negative Ki-67 14%   10/08/2012 Surgery Left lumpectomy and sentinel lymph node biopsy: 2.1 cm invasive ductal carcinoma 3 sentinel lymph nodes negative posterior margin reexcision ER positive PR positive HER-2 negative Ki-67 14% grade 1-2   11/10/2012 - 12/11/2012 Radiation Therapy Adjuvant radiation therapy   12/28/2012 -  Anti-estrogen oral therapy Arimidex 1 mg daily    CHIEF COMPLIANT: Breast cancer follow-up  INTERVAL HISTORY: [name] is a 81 year old lady with above-mentioned history of left-sided breast cancer treated with lumpectomy and radiation currently on Arimidex therapy tolerating it very well without any major problems. She had a palpable abnormality in the left breast was evaluated with mammogram and ultrasound which came back as normal. She is scheduled to undergo routine mammograms in February 2016. She is feeling great without any problems or concerns. She has also been evaluated for anemia of chronic kidney disease. For which she initially was treated with Aranesp injections. She has not required any Aranesp for the past 8 months. She feels well without any shortness of breath or fatigue.  REVIEW OF SYSTEMS:   Constitutional: Denies fevers, chills or abnormal weight loss Eyes: Denies blurriness of vision Ears, nose, mouth, throat, and face: Denies mucositis or sore throat Respiratory: Denies cough, dyspnea or wheezes Cardiovascular: Denies palpitation, chest discomfort or lower extremity swelling Gastrointestinal:  Denies nausea, heartburn or change in bowel habits Skin: Denies abnormal skin rashes Lymphatics: Denies new lymphadenopathy or easy  bruising Neurological:Denies numbness, tingling or new weaknesses Behavioral/Psych: Mood is stable, no new changes  Breast:  denies any pain or lumps or nodules in either breasts All other systems were reviewed with the patient and are negative.  I have reviewed the past medical history, past surgical history, social history and family history with the patient and they are unchanged from previous note.  ALLERGIES:  has No Known Allergies.  MEDICATIONS:  Current Outpatient Prescriptions  Medication Sig Dispense Refill  . Amlodipine-Valsartan-HCTZ (EXFORGE HCT) 5-160-12.5 MG TABS Take 1 tablet by mouth daily.    [name] anastrozole (ARIMIDEX) 1 MG tablet Take 1 tablet (1 mg total) by mouth daily. 90 tablet 3  . calcium carbonate (OS-CAL) 600 MG TABS Take 600 mg by mouth daily.    . Cholecalciferol (VITAMIN D-3) 1000 UNITS CAPS Take 1,000 Units by mouth daily.    . diclofenac sodium (VOLTAREN) 1 % GEL Apply 2 g topically 3 (three) times daily as needed. For arthritis pain    . fluticasone (FLONASE) 50 MCG/ACT nasal spray Place 2 sprays into both nostrils daily as needed for allergies or rhinitis. 16 g 2  . folic acid (FOLVITE) 360 MCG tablet Take 400-800 mcg by mouth daily.    [name] loratadine (CLARITIN) 10 MG tablet Take 1 tablet (10 mg total) by mouth daily as needed for allergies or rhinitis. 30 tablet 3  . Omega-3 Fatty Acids (FISH OIL) 500 MG CAPS Take 1 capsule by mouth daily.    [name] omeprazole (PRILOSEC) 20 MG capsule Take 1 capsule (20 mg total) by mouth daily. 30 capsule 3  . vitamin C (ASCORBIC ACID) 500 MG tablet Take 500 mg by mouth daily.     No current facility-administered medications for this visit.  PHYSICAL EXAMINATION: ECOG PERFORMANCE STATUS: 0 - Asymptomatic  Filed Vitals:   11/24/14 1002  BP: 139/62  Pulse: 76  Temp: 97.8 F (36.6 C)  Resp: 18   Filed Weights   11/24/14 1002  Weight: 137 lb 1.6 oz (62.188 kg)    GENERAL:alert, no distress and comfortable SKIN:  skin color, texture, turgor are normal, no rashes or significant lesions EYES: normal, Conjunctiva are pink and non-injected, sclera clear OROPHARYNX:no exudate, no erythema and lips, buccal mucosa, and tongue normal  NECK: supple, thyroid normal size, non-tender, without nodularity LYMPH:  no palpable lymphadenopathy in the cervical, axillary or inguinal LUNGS: clear to auscultation and percussion with normal breathing effort HEART: regular rate & rhythm and no murmurs and no lower extremity edema ABDOMEN:abdomen soft, non-tender and normal bowel sounds Musculoskeletal:no cyanosis of digits and no clubbing  NEURO: alert & oriented x 3 with fluent speech, no focal motor/sensory deficits  LABORATORY DATA:  I have reviewed the data as listed   Chemistry      Component Value Date/Time   NA 141 11/24/2014 0942   NA 138 10/13/2014 1210   NA 138 04/12/2010 0954   K 4.7 11/24/2014 0942   K 4.5 10/13/2014 1210   K 4.8* 04/12/2010 0954   CL 105 10/13/2014 1210   CL 105 12/28/2012 1059   CL 101 04/12/2010 0954   CO2 24 11/24/2014 0942   CO2 22 10/13/2014 1210   CO2 26 04/12/2010 0954   BUN 22.4 11/24/2014 0942   BUN 26* 10/13/2014 1210   BUN 14 04/12/2010 0954   CREATININE 1.4* 11/24/2014 0942   CREATININE 1.72* 10/13/2014 1210   CREATININE 1.2 04/12/2010 0954      Component Value Date/Time   CALCIUM 9.8 11/24/2014 0942   CALCIUM 9.9 10/13/2014 1210   CALCIUM 9.8 04/12/2010 0954   ALKPHOS 107 11/24/2014 0942   ALKPHOS 121* 07/17/2014 1428   ALKPHOS 106* 04/12/2010 0954   AST 31 11/24/2014 0942   AST 39* 07/17/2014 1428   AST 80* 04/12/2010 0954   ALT 25 11/24/2014 0942   ALT 32 07/17/2014 1428   ALT 69* 04/12/2010 0954   BILITOT 0.33 11/24/2014 0942   BILITOT 0.3 07/17/2014 1428   BILITOT 0.50 04/12/2010 0954       Lab Results  Component Value Date   WBC 5.0 11/24/2014   HGB 11.8 11/24/2014   HCT 35.9 11/24/2014   MCV 93.2 11/24/2014   PLT 238 11/24/2014    NEUTROABS 3.3 11/24/2014     RADIOGRAPHIC STUDIES: I have personally reviewed the radiology reports and agreed with their findings. Mammogram evaluation for a palpable abnormality in the left breast January 2016 is normal  ASSESSMENT & PLAN:  Breast cancer, left breast Left breast invasive ductal carcinoma T2 N0 M0 stage II a 2.1 cm grade 1 ER/PR positive HER-2 negative diagnosed November 2013 status post lumpectomy and radiation currently on oral Arimidex started March 2014.  Breast cancer surveillance: 1. Mammograms February 2015 revealed benign-appearing intramammary lymph node in the right side was evaluated by ultrasound and felt to be not concerning 2. Physical examination 10/27/2014: Palpable abnormality in the left breast at the lateral end of the previous scar tissue. This was evaluated by mammogram and ultrasound 11/03/2014 and was found to be benign area  Arimidex toxicities: Patient does not have any major toxicities from Arimidex. Her bone density test will need to be scheduled for July 2016.  Return to clinic in 6 months for follow-up  Anemia of chronic disease Anemia of chronic kidney disease: Patient does not need erythropoietin supplementation anymore. Her hemoglobin today is 11.8. Patient is completely asymptomatic. 6 month follow-up with recheck of labs.    Orders Placed This Encounter  Procedures  . DG Bone Density    Standing Status: Future     Number of Occurrences:      Standing Expiration Date: 11/24/2015    Order Specific Question:  Reason for Exam (SYMPTOM  OR DIAGNOSIS REQUIRED)    Answer:  osteoporosis evaluation    Order Specific Question:  Preferred imaging location?    Answer:  [facility]  . CBC with Differential    Standing Status: Future     Number of Occurrences:      Standing Expiration Date: 11/24/2015  . Comprehensive metabolic panel (Cmet) - [facility]    Standing Status: Future     Number of Occurrences:      Standing Expiration Date:  11/24/2015   The patient has a good understanding of the overall plan. she agrees with it. She will call with any problems that may develop before her next visit here.   Rulon Eisenmenger, MD

## 2014-12-01 DIAGNOSIS — C50812 Malignant neoplasm of overlapping sites of left female breast: Secondary | ICD-10-CM | POA: Diagnosis not present

## 2014-12-01 DIAGNOSIS — I10 Essential (primary) hypertension: Secondary | ICD-10-CM | POA: Diagnosis not present

## 2014-12-06 ENCOUNTER — Other Ambulatory Visit: Payer: Self-pay | Admitting: Hematology and Oncology

## 2014-12-06 DIAGNOSIS — Z9889 Other specified postprocedural states: Secondary | ICD-10-CM

## 2014-12-06 DIAGNOSIS — Z853 Personal history of malignant neoplasm of breast: Secondary | ICD-10-CM

## 2014-12-12 ENCOUNTER — Other Ambulatory Visit: Payer: Self-pay | Admitting: General Surgery

## 2014-12-12 DIAGNOSIS — Z9889 Other specified postprocedural states: Secondary | ICD-10-CM

## 2014-12-12 DIAGNOSIS — Z853 Personal history of malignant neoplasm of breast: Secondary | ICD-10-CM

## 2014-12-15 ENCOUNTER — Ambulatory Visit
Admission: RE | Admit: 2014-12-15 | Discharge: 2014-12-15 | Disposition: A | Discharge status: Home / Self Care | Payer: Medicare Other | Source: Ambulatory Visit | Attending: Hematology and Oncology | Admitting: Hematology and Oncology

## 2014-12-15 DIAGNOSIS — R928 Other abnormal and inconclusive findings on diagnostic imaging of breast: Secondary | ICD-10-CM | POA: Diagnosis not present

## 2014-12-15 DIAGNOSIS — Z9889 Other specified postprocedural states: Secondary | ICD-10-CM

## 2014-12-15 DIAGNOSIS — Z853 Personal history of malignant neoplasm of breast: Secondary | ICD-10-CM

## 2015-01-05 DIAGNOSIS — R7309 Other abnormal glucose: Secondary | ICD-10-CM | POA: Diagnosis not present

## 2015-01-05 DIAGNOSIS — B182 Chronic viral hepatitis C: Secondary | ICD-10-CM | POA: Diagnosis not present

## 2015-01-05 DIAGNOSIS — I10 Essential (primary) hypertension: Secondary | ICD-10-CM | POA: Diagnosis not present

## 2015-02-02 DIAGNOSIS — H2513 Age-related nuclear cataract, bilateral: Secondary | ICD-10-CM | POA: Diagnosis not present

## 2015-03-02 DIAGNOSIS — Z86010 Personal history of colonic polyps: Secondary | ICD-10-CM | POA: Diagnosis not present

## 2015-03-02 DIAGNOSIS — D509 Iron deficiency anemia, unspecified: Secondary | ICD-10-CM | POA: Diagnosis not present

## 2015-03-02 DIAGNOSIS — K219 Gastro-esophageal reflux disease without esophagitis: Secondary | ICD-10-CM | POA: Diagnosis not present

## 2015-03-05 ENCOUNTER — Emergency Department (HOSPITAL_COMMUNITY)
Admission: EM | Admit: 2015-03-05 | Discharge: 2015-03-05 | Disposition: A | Discharge status: Home / Self Care | Payer: Medicare Other | Attending: Emergency Medicine | Admitting: Emergency Medicine

## 2015-03-05 ENCOUNTER — Encounter (HOSPITAL_COMMUNITY): Payer: Self-pay | Admitting: Family Medicine

## 2015-03-05 ENCOUNTER — Emergency Department (HOSPITAL_COMMUNITY): Payer: Medicare Other

## 2015-03-05 DIAGNOSIS — R05 Cough: Secondary | ICD-10-CM | POA: Diagnosis not present

## 2015-03-05 DIAGNOSIS — I10 Essential (primary) hypertension: Secondary | ICD-10-CM | POA: Diagnosis not present

## 2015-03-05 DIAGNOSIS — Z87891 Personal history of nicotine dependence: Secondary | ICD-10-CM | POA: Insufficient documentation

## 2015-03-05 DIAGNOSIS — Z853 Personal history of malignant neoplasm of breast: Secondary | ICD-10-CM | POA: Insufficient documentation

## 2015-03-05 DIAGNOSIS — E860 Dehydration: Secondary | ICD-10-CM | POA: Insufficient documentation

## 2015-03-05 DIAGNOSIS — Z87448 Personal history of other diseases of urinary system: Secondary | ICD-10-CM | POA: Insufficient documentation

## 2015-03-05 DIAGNOSIS — Z8659 Personal history of other mental and behavioral disorders: Secondary | ICD-10-CM | POA: Diagnosis not present

## 2015-03-05 DIAGNOSIS — Z8739 Personal history of other diseases of the musculoskeletal system and connective tissue: Secondary | ICD-10-CM | POA: Diagnosis not present

## 2015-03-05 DIAGNOSIS — Z8639 Personal history of other endocrine, nutritional and metabolic disease: Secondary | ICD-10-CM | POA: Diagnosis not present

## 2015-03-05 DIAGNOSIS — Z7952 Long term (current) use of systemic steroids: Secondary | ICD-10-CM | POA: Diagnosis not present

## 2015-03-05 DIAGNOSIS — Z923 Personal history of irradiation: Secondary | ICD-10-CM | POA: Insufficient documentation

## 2015-03-05 DIAGNOSIS — R531 Weakness: Secondary | ICD-10-CM | POA: Diagnosis not present

## 2015-03-05 DIAGNOSIS — Z8719 Personal history of other diseases of the digestive system: Secondary | ICD-10-CM | POA: Diagnosis not present

## 2015-03-05 DIAGNOSIS — D649 Anemia, unspecified: Secondary | ICD-10-CM | POA: Diagnosis not present

## 2015-03-05 DIAGNOSIS — Z98811 Dental restoration status: Secondary | ICD-10-CM | POA: Diagnosis not present

## 2015-03-05 DIAGNOSIS — Z973 Presence of spectacles and contact lenses: Secondary | ICD-10-CM | POA: Diagnosis not present

## 2015-03-05 DIAGNOSIS — D638 Anemia in other chronic diseases classified elsewhere: Secondary | ICD-10-CM | POA: Insufficient documentation

## 2015-03-05 DIAGNOSIS — Z79899 Other long term (current) drug therapy: Secondary | ICD-10-CM | POA: Diagnosis not present

## 2015-03-05 DIAGNOSIS — R55 Syncope and collapse: Secondary | ICD-10-CM | POA: Insufficient documentation

## 2015-03-05 DIAGNOSIS — R51 Headache: Secondary | ICD-10-CM | POA: Diagnosis not present

## 2015-03-05 LAB — CBC
HEMATOCRIT: 34.1 % — AB (ref 36.0–46.0)
Hemoglobin: 11.6 g/dL — ABNORMAL LOW (ref 12.0–15.0)
MCH: 30 pg (ref 26.0–34.0)
MCHC: 34 g/dL (ref 30.0–36.0)
MCV: 88.1 fL (ref 78.0–100.0)
Platelets: 252 10*3/uL (ref 150–400)
RBC: 3.87 MIL/uL (ref 3.87–5.11)
RDW: 13.1 % (ref 11.5–15.5)
WBC: 6.6 10*3/uL (ref 4.0–10.5)

## 2015-03-05 LAB — URINALYSIS, ROUTINE W REFLEX MICROSCOPIC
Bilirubin Urine: NEGATIVE
Glucose, UA: NEGATIVE mg/dL
HGB URINE DIPSTICK: NEGATIVE
KETONES UR: NEGATIVE mg/dL
LEUKOCYTES UA: NEGATIVE
Nitrite: NEGATIVE
PROTEIN: NEGATIVE mg/dL
Specific Gravity, Urine: 1.006 (ref 1.005–1.030)
Urobilinogen, UA: 0.2 mg/dL (ref 0.0–1.0)
pH: 6.5 (ref 5.0–8.0)

## 2015-03-05 LAB — BASIC METABOLIC PANEL
Anion gap: 11 (ref 5–15)
BUN: 24 mg/dL — AB (ref 6–20)
CO2: 23 mmol/L (ref 22–32)
Calcium: 9.7 mg/dL (ref 8.9–10.3)
Chloride: 98 mmol/L — ABNORMAL LOW (ref 101–111)
Creatinine, Ser: 1.28 mg/dL — ABNORMAL HIGH (ref 0.44–1.00)
GFR calc non Af Amer: 38 mL/min — ABNORMAL LOW (ref >60)
GFR, EST AFRICAN AMERICAN: 45 mL/min — AB (ref >60)
GLUCOSE: 115 mg/dL — AB (ref 65–99)
Potassium: 4.7 mmol/L (ref 3.5–5.1)
Sodium: 132 mmol/L — ABNORMAL LOW (ref 135–145)

## 2015-03-05 LAB — HEPATIC FUNCTION PANEL
ALT: 41 U/L (ref 14–54)
AST: 60 U/L — ABNORMAL HIGH (ref 15–41)
Albumin: 3.7 g/dL (ref 3.5–5.0)
Alkaline Phosphatase: 104 U/L (ref 38–126)
Bilirubin, Direct: 0.1 mg/dL (ref 0.1–0.5)
Indirect Bilirubin: 0.5 mg/dL (ref 0.3–0.9)
Total Bilirubin: 0.6 mg/dL (ref 0.3–1.2)
Total Protein: 8.1 g/dL (ref 6.5–8.1)

## 2015-03-05 LAB — I-STAT TROPONIN, ED: Troponin i, poc: 0 ng/mL (ref 0.00–0.08)

## 2015-03-05 LAB — LIPASE, BLOOD: LIPASE: 72 U/L — AB (ref 22–51)

## 2015-03-05 MED ORDER — SODIUM CHLORIDE 0.9 % IV BOLUS (SEPSIS)
1000.0000 mL | Freq: Once | INTRAVENOUS | Status: AC
  Administered 2015-03-05: 1000 mL via INTRAVENOUS

## 2015-03-05 NOTE — Discharge Instructions (Signed)
Please call your doctor for a followup appointment within 24-48 hours. When you talk to your doctor please let them know that you were seen in the emergency department and have them acquire all of your records so that they can discuss the findings with you and formulate a treatment plan to fully care for your new and ongoing problems. Please follow-up with your primary care provider early this week for reassessment Please rest and stay hydrated Please continue to drink plenty of fluids Please avoid any physical or strenuous activity Please continue to monitor symptoms closely and if symptoms are to worsen or change (fever greater than 101, chills, sweating, nausea, vomiting, chest pain, shortness of breathe, difficulty breathing, weakness, numbness, tingling, worsening or changes to pain pattern, fall, injuries, headache, dizziness, chest pain, difficulty breathing, neck pain, neck stiffness, visual changes, numbness, tingling) please report back to the Emergency Department immediately.   Syncope Syncope is a medical term for fainting or passing out. This means you lose consciousness and drop to the ground. People are generally unconscious for less than 5 minutes. You may have some muscle twitches for up to 15 seconds before waking up and returning to normal. Syncope occurs more often in older adults, but it can happen to anyone. While most causes of syncope are not dangerous, syncope can be a sign of a serious medical problem. It is important to seek medical care.  CAUSES  Syncope is caused by a sudden drop in blood flow to the brain. The specific cause is often not determined. Factors that can bring on syncope include:  Taking medicines that lower blood pressure.  Sudden changes in posture, such as standing up quickly.  Taking more medicine than prescribed.  Standing in one place for too long.  Seizure disorders.  Dehydration and excessive exposure to heat.  Low blood sugar  (hypoglycemia).  Straining to have a bowel movement.  Heart disease, irregular heartbeat, or other circulatory problems.  Fear, emotional distress, seeing blood, or severe pain. SYMPTOMS  Right before fainting, you may:  Feel dizzy or light-headed.  Feel nauseous.  See all white or all black in your field of vision.  Have cold, clammy skin. DIAGNOSIS  Your health care provider will ask about your symptoms, perform a physical exam, and perform an electrocardiogram (ECG) to record the electrical activity of your heart. Your health care provider may also perform other heart or blood tests to determine the cause of your syncope which may include:  Transthoracic echocardiogram (TTE). During echocardiography, sound waves are used to evaluate how blood flows through your heart.  Transesophageal echocardiogram (TEE).  Cardiac monitoring. This allows your health care provider to monitor your heart rate and rhythm in real time.  Holter monitor. This is a portable device that records your heartbeat and can help diagnose heart arrhythmias. It allows your health care provider to track your heart activity for several days, if needed.  Stress tests by exercise or by giving medicine that makes the heart beat faster. TREATMENT  In most cases, no treatment is needed. Depending on the cause of your syncope, your health care provider may recommend changing or stopping some of your medicines. HOME CARE INSTRUCTIONS  Have someone stay with you until you feel stable.  Do not drive, use machinery, or play sports until your health care provider says it is okay.  Keep all follow-up appointments as directed by your health care provider.  Lie down right away if you start feeling like you might faint.  Breathe deeply and steadily. Wait until all the symptoms have passed.  Drink enough fluids to keep your urine clear or pale yellow.  If you are taking blood pressure or heart medicine, get up slowly and  take several minutes to sit and then stand. This can reduce dizziness. SEEK IMMEDIATE MEDICAL CARE IF:   You have a severe headache.  You have unusual pain in the chest, abdomen, or back.  You are bleeding from your mouth or rectum, or you have black or tarry stool.  You have an irregular or very fast heartbeat.  You have pain with breathing.  You have repeated fainting or seizure-like jerking during an episode.  You faint when sitting or lying down.  You have confusion.  You have trouble walking.  You have severe weakness.  You have vision problems. If you fainted, call your local emergency services (911 in [place] Do not drive yourself to the hospital.  MAKE SURE YOU:  Understand these instructions.  Will watch your condition.  Will get help right away if you are not doing well or get worse. Document Released: 10/07/2005 Document Revised: 10/12/2013 Document Reviewed: 12/06/2011 [facility] Patient Information 2015 [place], [place]. This information is not intended to replace advice given to you by your health care provider. Make sure you discuss any questions you have with your health care provider.   Dehydration, Adult Dehydration means your body does not have as much fluid as it needs. Your kidneys, brain, and heart will not work properly without the right amount of fluids and salt.  HOME CARE  Ask your doctor how to replace body fluid losses (rehydrate).  Drink enough fluids to keep your pee (urine) clear or pale yellow.  Drink small amounts of fluids often if you feel sick to your stomach (nauseous) or throw up (vomit).  Eat like you normally do.  Avoid:  Foods or drinks high in sugar.  Bubbly (carbonated) drinks.  Juice.  Very hot or cold fluids.  Drinks with caffeine.  Fatty, greasy foods.  Alcohol.  Tobacco.  Eating too much.  Gelatin desserts.  Wash your hands to avoid spreading germs (bacteria, viruses).  Only take medicine as told by  your doctor.  Keep all doctor visits as told. GET HELP RIGHT AWAY IF:   You cannot drink something without throwing up.  You get worse even with treatment.  Your vomit has blood in it or looks greenish.  Your poop (stool) has blood in it or looks black and tarry.  You have not peed in 6 to 8 hours.  You pee a small amount of very dark pee.  You have a fever.  You pass out (faint).  You have belly (abdominal) pain that gets worse or stays in one spot (localizes).  You have a rash, stiff neck, or bad headache.  You get easily annoyed, sleepy, or are hard to wake up.  You feel weak, dizzy, or very thirsty. MAKE SURE YOU:   Understand these instructions.  Will watch your condition.  Will get help right away if you are not doing well or get worse. Document Released: 08/03/2009 Document Revised: 12/30/2011 Document Reviewed: 05/27/2011 [facility] Patient Information 2015 [place], [place]. This information is not intended to replace advice given to you by your health care provider. Make sure you discuss any questions you have with your health care provider.

## 2015-03-05 NOTE — ED Notes (Signed)
PA Marissa at the bedside.

## 2015-03-05 NOTE — ED Notes (Signed)
Patient ambulated to the bathroom with no trouble. Denies dizziness.

## 2015-03-05 NOTE — ED Notes (Signed)
Patient ambulated. Patient denies any dizziness or nausea. Patient getting dressed. PA made aware.

## 2015-03-05 NOTE — ED Provider Notes (Signed)
CSN: [id number]     Arrival date & time 03/05/15  1446 History   First MD Initiated Contact with Patient 03/05/15 1606     Chief Complaint  Patient presents with  . Near Syncope     (Consider location/radiation/quality/duration/timing/severity/associated sxs/prior Treatment) Patient is a 81 y.o. female presenting with near-syncope. The history is provided by the patient. No language interpreter was used.  Near Syncope Pertinent negatives include no abdominal pain, chest pain, chills, coughing, fever, nausea, neck pain, numbness, vomiting or weakness.  [name] is an 81 y/o F with PMHx of anemia of chronic disease, anxiety, HTN, Hepatitis, lipid disorder, breast cancer with radiation in remission since 11/2012 presenting to the ED with a syncopal episode that occurred this morning at approximately 8:30AM. Patient reported that she was on the toilet having a BM, not baring down that much, with sudden onset of dizzy and the sensation as if she was going to pass out. Patient reported that she was able to get from the toilet to her bed and stated that she passed out on her bed for a couple of minutes. Son, who accompanies patient, reported that he found his mother on the bed laying there - reported that he was able to wake her up and stated that when he woke up her up she knew what was going on around her and knew who she was - alert and oriented x 3. Patient reported that this has happened numerous times, reported at least 10 times. Patient reported that she felt fine yesterday and this morning, reported that she did feel a little nauseous prior to the event, but stated that that has now subsided. Patient reported that she has been seen by her PCP regarding this, but reported that nothing has been done. Reported that she does not have a Cardiologist. Patient reported that she has not been staying hydrated. Denied chest pain, shortness of breath, difficulty breathing before and after the event.  Denied head injury, confusion, disorientation, changes to medications in the past couple of months, nausea, vomiting, numbness, tingling, blurred vision, sudden loss of vision, gait issues, balance issues, abdominal pain, melena, hemaotchezia, diarrhea, back pain, neck pain, fever, chills, cough, nasal congestion, recent illnesses. PCP Dr. Criss Rosales  Past Medical History  Diagnosis Date  . Anemia of chronic disease   . Anxiety   . Hypertension   . Hepatitis   . Lipid disorder   . Wears glasses   . Renal insufficiency   . Wears dentures     top  . Breast cancer 08/28/12    [facility] left breast bx=invasive ductal ca,ER/PR=+  . Breast cancer 09/01/2012  . Radiation 11/12/12-12/15/11    Left Breast/50 Gy   Past Surgical History  Procedure Laterality Date  . Abdominal hysterectomy       approx 20 years ago  . Appendectomy      20 years ago  . Foot surgery  1992    bone spurs both feet  . Colonoscopy  2009?  [name] Partial mastectomy with needle localization and axillary sentinel lymph node bx  10/08/2012    Procedure: PARTIAL MASTECTOMY WITH NEEDLE LOCALIZATION AND AXILLARY SENTINEL LYMPH NODE BX;  Surgeon: Adin Hector, MD;  Location: [facility];  Service: General;  Laterality: Left;  Left partial mastectomy with Needle localization and Sentinel lymph node biospy  . Enteroscopy N/A 11/08/2013    Procedure: ENTEROSCOPY;  Surgeon: Beryle Beams, MD;  Location: [facility];  Service: Endoscopy;  Laterality: N/A;  .  Bones spur      removed 20 years ago  . Enteroscopy N/A 04/18/2014    Procedure: ENTEROSCOPY;  Surgeon: Beryle Beams, MD;  Location: [facility];  Service: Endoscopy;  Laterality: N/A;   Family History  Problem Relation Age of Onset  . Cancer Father   . Prostate cancer Father   . Breast cancer Cousin   . Tuberculosis Mother    History  Substance Use Topics  . Smoking status: Former Smoker -- 1.00 packs/day    Types: Cigarettes    Quit date: 10/27/2007  .  Smokeless tobacco: Never Used     Comment: started smoking age 48  . Alcohol Use: 0.6 oz/week    1 Glasses of wine per week     Comment:  glass wine per week   OB History    No data available     Review of Systems  Constitutional: Negative for fever and chills.  Respiratory: Negative for cough, chest tightness and shortness of breath.   Cardiovascular: Positive for near-syncope. Negative for chest pain.  Gastrointestinal: Negative for nausea, vomiting, abdominal pain, diarrhea, constipation, blood in stool and anal bleeding.  Musculoskeletal: Negative for back pain, neck pain and neck stiffness.  Neurological: Positive for syncope. Negative for dizziness, weakness and numbness.      Allergies  Review of patient's allergies indicates no known allergies.  Home Medications   Prior to Admission medications   Medication Sig Start Date End Date Taking? Authorizing Provider  Amlodipine-Valsartan-HCTZ (EXFORGE HCT) 5-160-12.5 MG TABS Take 1 tablet by mouth daily.   Yes Historical Provider, MD  anastrozole (ARIMIDEX) 1 MG tablet Take 1 tablet (1 mg total) by mouth daily. 10/27/14  Yes Nicholas Lose, MD  calcium carbonate (OS-CAL) 600 MG TABS Take 600 mg by mouth daily.   Yes Historical Provider, MD  Cholecalciferol (VITAMIN D-3) 1000 UNITS CAPS Take 1,000 Units by mouth daily.   Yes Historical Provider, MD  diclofenac sodium (VOLTAREN) 1 % GEL Apply 2 g topically 3 (three) times daily as needed. For arthritis pain   Yes Historical Provider, MD  fluticasone (FLONASE) 50 MCG/ACT nasal spray Place 2 sprays into both nostrils daily as needed for allergies or rhinitis. 11/08/13  Yes Ripudeep Krystal Eaton, MD  folic acid (FOLVITE) 408 MCG tablet Take 400-800 mcg by mouth daily.   Yes Historical Provider, MD  loratadine (CLARITIN) 10 MG tablet Take 1 tablet (10 mg total) by mouth daily as needed for allergies or rhinitis. 11/08/13  Yes Ripudeep Krystal Eaton, MD  Omega-3 Fatty Acids (FISH OIL) 500 MG CAPS Take 1  capsule by mouth daily.   Yes Historical Provider, MD  omeprazole (PRILOSEC) 20 MG capsule Take 1 capsule (20 mg total) by mouth daily. 11/08/13  Yes Ripudeep Krystal Eaton, MD  vitamin C (ASCORBIC ACID) 500 MG tablet Take 500 mg by mouth daily.   Yes Historical Provider, MD   BP 139/52 mmHg  Pulse 81  Temp(Src) 98.6 F (37 C) (Oral)  Resp 18  SpO2 99% Physical Exam  Constitutional: She is oriented to person, place, and time. She appears well-developed and well-nourished. No distress.  HENT:  Head: Normocephalic and atraumatic.  Mouth/Throat: Oropharynx is clear and moist. No oropharyngeal exudate.  Eyes: Conjunctivae and EOM are normal. Pupils are equal, round, and reactive to light. Right eye exhibits no discharge. Left eye exhibits no discharge.  Neck: Normal range of motion. Neck supple. No tracheal deviation present.  Cardiovascular: Normal rate, regular rhythm and normal heart  sounds.  Exam reveals no friction rub.   No murmur heard. Pulses:      Radial pulses are 2+ on the right side, and 2+ on the left side.       Dorsalis pedis pulses are 2+ on the right side, and 2+ on the left side.  Pulmonary/Chest: Effort normal and breath sounds normal. No respiratory distress. She has no wheezes. She has no rales.  Musculoskeletal: Normal range of motion.  Full ROM to upper and lower extremities without difficulty noted, negative ataxia noted.  Lymphadenopathy:    She has no cervical adenopathy.  Neurological: She is alert and oriented to person, place, and time. No cranial nerve deficit. She exhibits normal muscle tone. Coordination normal.  Cranial nerves III-XII grossly intact Strength 5+/5+ to upper and lower extremities bilaterally with resistance applied, equal distribution noted Equal grip strength Negative facial drooping Negatives alert speech Negative aphasia Patient is able to bring finger to nose bilaterally without difficulty or ataxia Negative arm drift Fine motor skills  intact Gait proper, proper balance - negative sway, negative drift, negative step-offs Patient is able to follow commands well Patient response to questions appropriately  Skin: Skin is warm and dry. No rash noted. She is not diaphoretic. No erythema.  Psychiatric: She has a normal mood and affect. Her behavior is normal. Thought content normal.  Nursing note and vitals reviewed.   ED Course  Procedures (including critical care time)  Results for orders placed or performed during the hospital encounter of 03/05/15  CBC  Result Value Ref Range   WBC 6.6 4.0 - 10.5 K/uL   RBC 3.87 3.87 - 5.11 MIL/uL   Hemoglobin 11.6 (L) 12.0 - 15.0 g/dL   HCT 34.1 (L) 36.0 - 46.0 %   MCV 88.1 78.0 - 100.0 fL   MCH 30.0 26.0 - 34.0 pg   MCHC 34.0 30.0 - 36.0 g/dL   RDW 13.1 11.5 - 15.5 %   Platelets 252 150 - 400 K/uL  Basic metabolic panel  Result Value Ref Range   Sodium 132 (L) 135 - 145 mmol/L   Potassium 4.7 3.5 - 5.1 mmol/L   Chloride 98 (L) 101 - 111 mmol/L   CO2 23 22 - 32 mmol/L   Glucose, Bld 115 (H) 65 - 99 mg/dL   BUN 24 (H) 6 - 20 mg/dL   Creatinine, Ser 1.28 (H) 0.44 - 1.00 mg/dL   Calcium 9.7 8.9 - 10.3 mg/dL   GFR calc non Af Amer 38 (L) >60 mL/min   GFR calc Af Amer 45 (L) >60 mL/min   Anion gap 11 5 - 15  Hepatic function panel  Result Value Ref Range   Total Protein 8.1 6.5 - 8.1 g/dL   Albumin 3.7 3.5 - 5.0 g/dL   AST 60 (H) 15 - 41 U/L   ALT 41 14 - 54 U/L   Alkaline Phosphatase 104 38 - 126 U/L   Total Bilirubin 0.6 0.3 - 1.2 mg/dL   Bilirubin, Direct 0.1 0.1 - 0.5 mg/dL   Indirect Bilirubin 0.5 0.3 - 0.9 mg/dL  Lipase, blood  Result Value Ref Range   Lipase 72 (H) 22 - 51 U/L  Urinalysis, Routine w reflex microscopic  Result Value Ref Range   Color, Urine YELLOW YELLOW   APPearance CLEAR CLEAR   Specific Gravity, Urine 1.006 1.005 - 1.030   pH 6.5 5.0 - 8.0   Glucose, UA NEGATIVE NEGATIVE mg/dL   Hgb urine dipstick NEGATIVE NEGATIVE  Bilirubin Urine  NEGATIVE NEGATIVE   Ketones, ur NEGATIVE NEGATIVE mg/dL   Protein, ur NEGATIVE NEGATIVE mg/dL   Urobilinogen, UA 0.2 0.0 - 1.0 mg/dL   Nitrite NEGATIVE NEGATIVE   Leukocytes, UA NEGATIVE NEGATIVE  I-stat troponin, ED  (not at [facility], [facility])  Result Value Ref Range   Troponin i, poc 0.00 0.00 - 0.08 ng/mL   Comment 3            Labs Review Labs Reviewed  CBC - Abnormal; Notable for the following:    Hemoglobin 11.6 (*)    HCT 34.1 (*)    All other components within normal limits  BASIC METABOLIC PANEL - Abnormal; Notable for the following:    Sodium 132 (*)    Chloride 98 (*)    Glucose, Bld 115 (*)    BUN 24 (*)    Creatinine, Ser 1.28 (*)    GFR calc non Af Amer 38 (*)    GFR calc Af Amer 45 (*)    All other components within normal limits  HEPATIC FUNCTION PANEL - Abnormal; Notable for the following:    AST 60 (*)    All other components within normal limits  LIPASE, BLOOD - Abnormal; Notable for the following:    Lipase 72 (*)    All other components within normal limits  URINALYSIS, ROUTINE W REFLEX MICROSCOPIC  I-STAT TROPOININ, ED    Imaging Review Dg Chest 2 View  03/05/2015   CLINICAL DATA:  Weakness syncope and cough for 1 day.  EXAM: CHEST  2 VIEW  COMPARISON:  10/13/2014 and prior chest radiographs  FINDINGS: Mild cardiomegaly again noted.  The cardiomediastinal silhouette is unremarkable.  Surgical clips overlying the left breast identified.  There is no evidence of focal airspace disease, pulmonary edema, suspicious pulmonary nodule/mass, pleural effusion, or pneumothorax.  No acute bony abnormalities are identified.  IMPRESSION: Mild cardiomegaly without evidence of acute cardiopulmonary disease.   Electronically Signed   By: Margarette Canada M.D.   On: 03/05/2015 16:12   Ct Head Wo Contrast  03/05/2015   CLINICAL DATA:  81 year old female with near syncope, weakness headache and nausea. History of breast cancer.  EXAM: CT HEAD WITHOUT CONTRAST  TECHNIQUE: Contiguous  axial images were obtained from the base of the skull through the vertex without intravenous contrast.  COMPARISON:  07/17/2014  FINDINGS: Atrophy and mild chronic small-vessel white matter ischemic changes are again noted.  No acute intracranial abnormalities are identified, including mass lesion or mass effect, hydrocephalus, extra-axial fluid collection, midline shift, hemorrhage, or acute infarction.  The visualized bony calvarium is unremarkable.  IMPRESSION: No evidence of acute intracranial abnormality.  Mild atrophy and chronic small-vessel white matter ischemic changes.   Electronically Signed   By: Margarette Canada M.D.   On: 03/05/2015 16:56     EKG Interpretation   Date/Time:  Sunday Mar 05 2015 14:52:13 EDT Ventricular Rate:  82 PR Interval:  186 QRS Duration: 80 QT Interval:  388 QTC Calculation: 453 R Axis:   31 Text Interpretation:  Normal sinus rhythm Normal ECG No significant change  since last tracing Confirmed by Mingo Amber  MD, BLAIR ([id number]) on 03/05/2015  4:22:47 PM       Orthostatic VS for the past 24 hrs:  BP- Lying Pulse- Lying BP- Sitting Pulse- Sitting BP- Standing at 0 minutes Pulse- Standing at 0 minutes  03/05/15 1531 136/52 mmHg 78 137/55 mmHg 82 144/56 mmHg 85   7:32 PM Patient ambulated well. Patient  denied dizziness, chest pain, shortness of breath or any abnormalities when walking.  8:14 PM This provider reassess the patient. Patient appears well like to go home. Discussed labs and imaging in great detail with patient. Patient denied chest pain, shortness of breath, difficulty breathing or any other complaints. Discussed plan for discharge with patient who is in accordance to plan of care. Strict return precautions given to patient understands. Recommended patient to follow-up with her primary care provider within approximately 24-48 hours-patient understood.  MDM   Final diagnoses:  Syncope, unspecified syncope type  Vasovagal episode  Dehydration     Medications  sodium chloride 0.9 % bolus 1,000 mL (0 mLs Intravenous Stopped 03/05/15 1929)    Filed Vitals:   03/05/15 1800 03/05/15 1830 03/05/15 1845 03/05/15 1926  BP: 134/51 130/52 131/50 139/52  Pulse: 80 83 81 81  Temp:    98.6 F (37 C)  TempSrc:    Oral  Resp: 21 22 22 18   SpO2: 98% 97% 96% 99%   This provider reviewed patient's chart. Patient is been seen and assessed in ED setting regarding syncopal episodes in the past. Last episode was in December 2015 where patient was seen and assessed regarding syncopal episode with negative labs and patient is discharged home. EKG noted normal sinus rhythm with a heart rate of 82 bpm-and that he can change since last tracing. I-STAT troponin negative elevation. CBC negative elevated leukocytosis. Hemoglobin 11.6, hematocrit 34.1 - appears to be at baseline when compared to previous labs. BMP noted mildly elevated BUN of 24, creatinine 1.28 - when compared to previous labs patient has been as high as 1.72 and her creatinine as high as 33.3 in her BUN, 5 months ago. Improvement identified. Hepatic function panel unremarkable. Lipase elevated at 72 - this is something new, yet no previous lipases to compare the past. Urinalysis negative for hemoglobin, nitrites, leukocytes. Chest x-ray noted mild cardiomegaly without evidence of acute cardiopulmonary disease-negative findings of fluid overload. CT head without contrast no evidence of acute intracranial abnormalities, mild atrophy and chronic small vessel white matter ischemic changes identified. Negative findings of pneumonia. Negative findings of fluid overload. Negative findings of UTI or pyelonephritis. Patient appears well, sitting upright in follows commands on physical examination. Patient is pleasant and interactive. Negative focal neurological deficits identified. Patient given IV fluids in ED setting. Orthostatics unremarkable. Patient seen and assessed by attending physician, Dr. Evelina Bucy, who agrees to plan of discharge. Patient intubated in the ED without complaints of chest pain, shortness of breath or difficulty breathing. Patient feels comfortable to go home. Patient stable, afebrile. Patient not septic appearing. Suspicion of episode vasovagal as well as dehydration. Patient discharged. Discussed with patient to rest and stay hydrated. Discussed with patient to avoid any physical or strenuous activity. Referred patient to primary care provider to be seen and assessed earlier in the week. Discussed with patient to closely monitor symptoms and if symptoms are to worsen or change to report back to the ED - strict return instructions given.  Patient agreed to plan of care, understood, all questions answered.   Jamse Mead, PA-C 03/05/15 2027  Evelina Bucy, MD 03/05/15 [phone]

## 2015-03-05 NOTE — ED Notes (Signed)
Pt here for near syncope episode on the toilet. sts that she got up and made it to the bed. sts very weak and had some nausea his am. sts HA.

## 2015-03-05 NOTE — ED Notes (Signed)
Patient returned from [place]. Sharyn Lull, RN at the bedside attempting IV.

## 2015-03-06 ENCOUNTER — Emergency Department (HOSPITAL_COMMUNITY)
Admission: EM | Admit: 2015-03-06 | Discharge: 2015-03-06 | Disposition: A | Discharge status: Home / Self Care | Payer: Medicare Other | Attending: Emergency Medicine | Admitting: Emergency Medicine

## 2015-03-06 ENCOUNTER — Emergency Department (HOSPITAL_COMMUNITY): Payer: Medicare Other

## 2015-03-06 ENCOUNTER — Encounter (HOSPITAL_COMMUNITY): Payer: Self-pay | Admitting: Emergency Medicine

## 2015-03-06 DIAGNOSIS — M6281 Muscle weakness (generalized): Secondary | ICD-10-CM | POA: Diagnosis not present

## 2015-03-06 DIAGNOSIS — Z87891 Personal history of nicotine dependence: Secondary | ICD-10-CM | POA: Insufficient documentation

## 2015-03-06 DIAGNOSIS — Z87448 Personal history of other diseases of urinary system: Secondary | ICD-10-CM | POA: Diagnosis not present

## 2015-03-06 DIAGNOSIS — D649 Anemia, unspecified: Secondary | ICD-10-CM | POA: Insufficient documentation

## 2015-03-06 DIAGNOSIS — R1030 Lower abdominal pain, unspecified: Secondary | ICD-10-CM | POA: Insufficient documentation

## 2015-03-06 DIAGNOSIS — N281 Cyst of kidney, acquired: Secondary | ICD-10-CM | POA: Diagnosis not present

## 2015-03-06 DIAGNOSIS — Z8659 Personal history of other mental and behavioral disorders: Secondary | ICD-10-CM | POA: Insufficient documentation

## 2015-03-06 DIAGNOSIS — R109 Unspecified abdominal pain: Secondary | ICD-10-CM

## 2015-03-06 DIAGNOSIS — R531 Weakness: Secondary | ICD-10-CM | POA: Insufficient documentation

## 2015-03-06 DIAGNOSIS — Z8719 Personal history of other diseases of the digestive system: Secondary | ICD-10-CM | POA: Diagnosis not present

## 2015-03-06 DIAGNOSIS — Z853 Personal history of malignant neoplasm of breast: Secondary | ICD-10-CM | POA: Diagnosis not present

## 2015-03-06 DIAGNOSIS — I10 Essential (primary) hypertension: Secondary | ICD-10-CM | POA: Diagnosis not present

## 2015-03-06 DIAGNOSIS — Z8639 Personal history of other endocrine, nutritional and metabolic disease: Secondary | ICD-10-CM | POA: Diagnosis not present

## 2015-03-06 DIAGNOSIS — Z9049 Acquired absence of other specified parts of digestive tract: Secondary | ICD-10-CM | POA: Diagnosis not present

## 2015-03-06 DIAGNOSIS — R1084 Generalized abdominal pain: Secondary | ICD-10-CM | POA: Diagnosis not present

## 2015-03-06 LAB — CBC WITH DIFFERENTIAL/PLATELET
Basophils Absolute: 0 10*3/uL (ref 0.0–0.1)
Basophils Relative: 1 % (ref 0–1)
Eosinophils Absolute: 0.1 10*3/uL (ref 0.0–0.7)
Eosinophils Relative: 2 % (ref 0–5)
HCT: 33.9 % — ABNORMAL LOW (ref 36.0–46.0)
Hemoglobin: 11.5 g/dL — ABNORMAL LOW (ref 12.0–15.0)
Lymphocytes Relative: 25 % (ref 12–46)
Lymphs Abs: 1.5 10*3/uL (ref 0.7–4.0)
MCH: 29.9 pg (ref 26.0–34.0)
MCHC: 33.9 g/dL (ref 30.0–36.0)
MCV: 88.3 fL (ref 78.0–100.0)
Monocytes Absolute: 0.4 10*3/uL (ref 0.1–1.0)
Monocytes Relative: 7 % (ref 3–12)
Neutro Abs: 3.9 10*3/uL (ref 1.7–7.7)
Neutrophils Relative %: 65 % (ref 43–77)
Platelets: 243 10*3/uL (ref 150–400)
RBC: 3.84 MIL/uL — ABNORMAL LOW (ref 3.87–5.11)
RDW: 13.2 % (ref 11.5–15.5)
WBC: 5.9 10*3/uL (ref 4.0–10.5)

## 2015-03-06 LAB — COMPREHENSIVE METABOLIC PANEL
ALT: 43 U/L (ref 14–54)
AST: 58 U/L — ABNORMAL HIGH (ref 15–41)
Albumin: 3.7 g/dL (ref 3.5–5.0)
Alkaline Phosphatase: 106 U/L (ref 38–126)
Anion gap: 9 (ref 5–15)
BUN: 19 mg/dL (ref 6–20)
CO2: 26 mmol/L (ref 22–32)
Calcium: 9.8 mg/dL (ref 8.9–10.3)
Chloride: 100 mmol/L — ABNORMAL LOW (ref 101–111)
Creatinine, Ser: 1.2 mg/dL — ABNORMAL HIGH (ref 0.44–1.00)
GFR calc Af Amer: 48 mL/min — ABNORMAL LOW (ref >60)
GFR calc non Af Amer: 42 mL/min — ABNORMAL LOW (ref >60)
Glucose, Bld: 97 mg/dL (ref 65–99)
Potassium: 4.9 mmol/L (ref 3.5–5.1)
Sodium: 135 mmol/L (ref 135–145)
Total Bilirubin: 0.6 mg/dL (ref 0.3–1.2)
Total Protein: 8.2 g/dL — ABNORMAL HIGH (ref 6.5–8.1)

## 2015-03-06 LAB — URINALYSIS, ROUTINE W REFLEX MICROSCOPIC
Bilirubin Urine: NEGATIVE
Glucose, UA: NEGATIVE mg/dL
Hgb urine dipstick: NEGATIVE
Ketones, ur: NEGATIVE mg/dL
Leukocytes, UA: NEGATIVE
Nitrite: NEGATIVE
Protein, ur: NEGATIVE mg/dL
Specific Gravity, Urine: 1.006 (ref 1.005–1.030)
Urobilinogen, UA: 0.2 mg/dL (ref 0.0–1.0)
pH: 6.5 (ref 5.0–8.0)

## 2015-03-06 LAB — TROPONIN I: Troponin I: <0.03 ng/mL (ref <0.031)

## 2015-03-06 MED ORDER — SODIUM CHLORIDE 0.9 % IV BOLUS (SEPSIS)
1000.0000 mL | Freq: Once | INTRAVENOUS | Status: AC
  Administered 2015-03-06: 1000 mL via INTRAVENOUS

## 2015-03-06 MED ORDER — IOHEXOL 300 MG/ML  SOLN
90.0000 mL | Freq: Once | INTRAMUSCULAR | Status: AC | PRN
  Administered 2015-03-06: 90 mL via INTRAVENOUS

## 2015-03-06 NOTE — ED Notes (Signed)
IV Attempted x's 2 without success

## 2015-03-06 NOTE — ED Notes (Signed)
CT made aware pt finished drinking contrast

## 2015-03-06 NOTE — ED Provider Notes (Signed)
CSN: [id number]     Arrival date & time 03/06/15  1409 History   First MD Initiated Contact with Patient 03/06/15 1630     Chief Complaint  Patient presents with  . Weakness  . Abdominal Pain     (Consider location/radiation/quality/duration/timing/severity/associated sxs/prior Treatment) HPI Patient presents to the emergency department with generalized weakness that started yesterday.  The patient was seen here in the emergency department for generalized weakness.  Patient had laboratory testing and CT scan of the head and plain x-rays of the chest yesterday.  Patient states that she has not had any fever, nausea, vomiting, dizziness, back pain, neck pain, fever, cough, runny nose, sore throat, chest pain, shortness of breath or syncope.  The patient states she felt near syncopal yesterday and got cold and clammy during the episode.  Patient states that she awoke today with continuing generalized weakness is also complaining of lower abdominal pain..  Patient states nothing seems make her condition better or worse.  She did not take any medications at home  Past Medical History  Diagnosis Date  . Anemia of chronic disease   . Anxiety   . Hypertension   . Hepatitis   . Lipid disorder   . Wears glasses   . Renal insufficiency   . Wears dentures     top  . Breast cancer 08/28/12    IDC left breast bx=invasive ductal ca,ER/PR=+  . Breast cancer 09/01/2012  . Radiation 11/12/12-12/15/11    Left Breast/50 Gy   Past Surgical History  Procedure Laterality Date  . Abdominal hysterectomy       approx 20 years ago  . Appendectomy      20 years ago  . Foot surgery  1992    bone spurs both feet  . Colonoscopy  2009?  [name] Partial mastectomy with needle localization and axillary sentinel lymph node bx  10/08/2012    Procedure: PARTIAL MASTECTOMY WITH NEEDLE LOCALIZATION AND AXILLARY SENTINEL LYMPH NODE BX;  Surgeon: Adin Hector, MD;  Location: [place];  Service: General;   Laterality: Left;  Left partial mastectomy with Needle localization and Sentinel lymph node biospy  . Enteroscopy N/A 11/08/2013    Procedure: ENTEROSCOPY;  Surgeon: Beryle Beams, MD;  Location: [place];  Service: Endoscopy;  Laterality: N/A;  . Bones spur      removed 20 years ago  . Enteroscopy N/A 04/18/2014    Procedure: ENTEROSCOPY;  Surgeon: Beryle Beams, MD;  Location: [place];  Service: Endoscopy;  Laterality: N/A;   Family History  Problem Relation Age of Onset  . Cancer Father   . Prostate cancer Father   . Breast cancer Cousin   . Tuberculosis Mother    History  Substance Use Topics  . Smoking status: Former Smoker -- 1.00 packs/day    Types: Cigarettes    Quit date: 10/27/2007  . Smokeless tobacco: Never Used     Comment: started smoking age 17  . Alcohol Use: 0.6 oz/week    1 Glasses of wine per week     Comment:  glass wine per week   OB History    No data available     Review of Systems All other systems negative except as documented in the HPI. All pertinent positives and negatives as reviewed in the HPI.   Allergies  Review of patient's allergies indicates no known allergies.  Home Medications   Prior to Admission medications   Medication Sig Start Date End Date  Taking? Authorizing Provider  Amlodipine-Valsartan-HCTZ (EXFORGE HCT) 5-160-12.5 MG TABS Take 1 tablet by mouth daily.   Yes Historical Provider, MD  anastrozole (ARIMIDEX) 1 MG tablet Take 1 tablet (1 mg total) by mouth daily. 10/27/14  Yes Nicholas Lose, MD  calcium carbonate (OS-CAL) 600 MG TABS Take 600 mg by mouth daily.   Yes Historical Provider, MD  Cholecalciferol (VITAMIN D-3) 1000 UNITS CAPS Take 1,000 Units by mouth daily.   Yes Historical Provider, MD  diclofenac sodium (VOLTAREN) 1 % GEL Apply 2 g topically 3 (three) times daily as needed. For arthritis pain   Yes Historical Provider, MD  fluticasone (FLONASE) 50 MCG/ACT nasal spray Place 2 sprays into both nostrils daily as  needed for allergies or rhinitis. 11/08/13  Yes Ripudeep Krystal Eaton, MD  folic acid (FOLVITE) 737 MCG tablet Take 400-800 mcg by mouth daily.   Yes Historical Provider, MD  loratadine (CLARITIN) 10 MG tablet Take 1 tablet (10 mg total) by mouth daily as needed for allergies or rhinitis. 11/08/13  Yes Ripudeep Krystal Eaton, MD  Omega-3 Fatty Acids (FISH OIL) 500 MG CAPS Take 1 capsule by mouth daily.   Yes Historical Provider, MD  omeprazole (PRILOSEC) 20 MG capsule Take 1 capsule (20 mg total) by mouth daily. 11/08/13  Yes Ripudeep Krystal Eaton, MD  vitamin C (ASCORBIC ACID) 500 MG tablet Take 500 mg by mouth daily.   Yes Historical Provider, MD   BP 131/62 mmHg  Pulse 81  Temp(Src) 97.8 F (36.6 C) (Oral)  Resp 20  SpO2 98% Physical Exam  Constitutional: She is oriented to person, place, and time. She appears well-developed and well-nourished. No distress.  HENT:  Head: Normocephalic and atraumatic.  Mouth/Throat: Oropharynx is clear and moist.  Eyes: Pupils are equal, round, and reactive to light.  Neck: Normal range of motion. Neck supple.  Cardiovascular: Normal rate, regular rhythm and normal heart sounds.  Exam reveals no gallop and no friction rub.   No murmur heard. Pulmonary/Chest: Effort normal and breath sounds normal. No respiratory distress.  Abdominal: Soft. Normal appearance and bowel sounds are normal.    Musculoskeletal: Normal range of motion. She exhibits no edema.  Neurological: She is alert and oriented to person, place, and time. She exhibits normal muscle tone. Coordination normal.  Skin: Skin is warm and dry. No rash noted. No erythema.  Psychiatric: She has a normal mood and affect. Her behavior is normal.  Nursing note and vitals reviewed.   ED Course  Procedures (including critical care time) Labs Review Labs Reviewed  CBC WITH DIFFERENTIAL/PLATELET - Abnormal; Notable for the following:    RBC 3.84 (*)    Hemoglobin 11.5 (*)    HCT 33.9 (*)    All other components  within normal limits  COMPREHENSIVE METABOLIC PANEL - Abnormal; Notable for the following:    Chloride 100 (*)    Creatinine, Ser 1.20 (*)    Total Protein 8.2 (*)    AST 58 (*)    GFR calc non Af Amer 42 (*)    GFR calc Af Amer 48 (*)    All other components within normal limits  URINALYSIS, ROUTINE W REFLEX MICROSCOPIC  TROPONIN I    Imaging Review Dg Chest 2 View  03/05/2015   CLINICAL DATA:  Weakness syncope and cough for 1 day.  EXAM: CHEST  2 VIEW  COMPARISON:  10/13/2014 and prior chest radiographs  FINDINGS: Mild cardiomegaly again noted.  The cardiomediastinal silhouette is unremarkable.  Surgical clips  overlying the left breast identified.  There is no evidence of focal airspace disease, pulmonary edema, suspicious pulmonary nodule/mass, pleural effusion, or pneumothorax.  No acute bony abnormalities are identified.  IMPRESSION: Mild cardiomegaly without evidence of acute cardiopulmonary disease.   Electronically Signed   By: Margarette Canada M.D.   On: 03/05/2015 16:12   Ct Head Wo Contrast  03/05/2015   CLINICAL DATA:  81 year old female with near syncope, weakness headache and nausea. History of breast cancer.  EXAM: CT HEAD WITHOUT CONTRAST  TECHNIQUE: Contiguous axial images were obtained from the base of the skull through the vertex without intravenous contrast.  COMPARISON:  07/17/2014  FINDINGS: Atrophy and mild chronic small-vessel white matter ischemic changes are again noted.  No acute intracranial abnormalities are identified, including mass lesion or mass effect, hydrocephalus, extra-axial fluid collection, midline shift, hemorrhage, or acute infarction.  The visualized bony calvarium is unremarkable.  IMPRESSION: No evidence of acute intracranial abnormality.  Mild atrophy and chronic small-vessel white matter ischemic changes.   Electronically Signed   By: Margarette Canada M.D.   On: 03/05/2015 16:56   Ct Abdomen Pelvis W Contrast  03/06/2015   CLINICAL DATA:  Generalized weakness  and tiredness. Lower abdominal pain. Initial encounter.  EXAM: CT ABDOMEN AND PELVIS WITH CONTRAST  TECHNIQUE: Multidetector CT imaging of the abdomen and pelvis was performed using the standard protocol following bolus administration of intravenous contrast.  CONTRAST:  25mL OMNIPAQUE IOHEXOL 300 MG/ML  SOLN  COMPARISON:  CT of the abdomen and pelvis performed 03/08/2014  FINDINGS: Emphysematous change is noted at the lung bases, with mild bronchiectasis at both lower lobes.  The liver and spleen are unremarkable in appearance. The gallbladder is within normal limits. The pancreas and adrenal glands are unremarkable.  A tiny 4 mm cyst is noted at the interpole region of the right kidney. The kidneys are otherwise unremarkable. There is no evidence of hydronephrosis. No renal or ureteral stones are seen. No perinephric stranding is appreciated.  No free fluid is identified. The small bowel is unremarkable in appearance. The stomach is within normal limits. No acute vascular abnormalities are seen. Relatively diffuse calcification is seen along the abdominal aorta and its branches.  The patient is status post appendectomy. The colon is unremarkable in appearance.  The bladder is mildly distended and grossly unremarkable. The patient is status post hysterectomy. No suspicious adnexal masses are seen. No inguinal lymphadenopathy is seen.  No acute osseous abnormalities are identified. There is grade 1 anterolisthesis of L3 on L4, and vacuum phenomenon is noted at L4-L5, with mild associated disc space narrowing. A chronic left-sided pars defect is noted at L5. Facet disease is noted at the mid lumbar spine.  IMPRESSION: 1. No acute abnormality seen to explain the patient's symptoms. 2. Relatively diffuse calcification along the abdominal aorta and its branches. 3. Emphysematous change at the lung bases, with mild bronchiectasis at both lower lobes. 4. Tiny right renal cyst noted. 5. Chronic left-sided pars defect again  noted at L5. Mild underlying degenerative change noted along the lumbar spine.   Electronically Signed   By: Garald Balding M.D.   On: 03/06/2015 20:06     EKG Interpretation None      the patient's records from yesterday were reviewed along with her testing here today.  I did add a troponin and CT scan of the abdomen to the workup.  The patient does not have any abnormalities noted other than a mild anemia.  She is  not orthostatic and she is ambulated to the bathroom several times.  The patient will be advised to follow-up with her primary care doctor.  I have advised her that they may need to do further testing with her iron levels and thyroid function studies.  Patient is given further IV fluids here in the emergency department as well.  At this point we do not have a good reason why the patient has weakness.  She did complain of some mucus in her stool and this could be causing her to have weakness due to a possible viral gastroenteritis.  Dalia Heading, PA-C 03/06/15 2051  Virgel Manifold, MD 03/08/15 [phone]

## 2015-03-06 NOTE — ED Notes (Signed)
Lying  B/P 144/61  P 76 Sitting  B/P  145/61  P 79 Standing  B/P  153/68  P 91

## 2015-03-06 NOTE — ED Notes (Signed)
Pt st's she was seen and treated here yesterday for dehydration.  St's she woke up this am feeling tired.  St;s she just doesn't feel good.  Pt alert and oriented x's 3.  Skin warm and dry, color appropriate

## 2015-03-06 NOTE — ED Notes (Signed)
Pt here for generalized weakness and not feeling well; pt sts some lower abd pain; pt seen here yesterday for same

## 2015-03-06 NOTE — Discharge Instructions (Signed)
Return here as needed.  Your testing here today did not show any significant abnormalities.  Her CT scan did not show any cause for your abdominal pain. Need to follow-up with your primary doctor to assess your thyroid function and iron levels

## 2015-03-06 NOTE — ED Notes (Signed)
Pt ambulatory to bathroom without any problems 

## 2015-03-07 ENCOUNTER — Telehealth: Payer: Self-pay | Admitting: *Deleted

## 2015-03-07 ENCOUNTER — Encounter (HOSPITAL_COMMUNITY): Payer: Self-pay | Admitting: *Deleted

## 2015-03-07 ENCOUNTER — Observation Stay (HOSPITAL_COMMUNITY)
Admission: EM | Admit: 2015-03-07 | Discharge: 2015-03-09 | Disposition: A | Discharge status: Home / Self Care | Payer: Medicare Other | Attending: Internal Medicine | Admitting: Internal Medicine

## 2015-03-07 DIAGNOSIS — R0609 Other forms of dyspnea: Principal | ICD-10-CM | POA: Diagnosis present

## 2015-03-07 DIAGNOSIS — F1099 Alcohol use, unspecified with unspecified alcohol-induced disorder: Secondary | ICD-10-CM | POA: Insufficient documentation

## 2015-03-07 DIAGNOSIS — Z87891 Personal history of nicotine dependence: Secondary | ICD-10-CM | POA: Diagnosis not present

## 2015-03-07 DIAGNOSIS — E785 Hyperlipidemia, unspecified: Secondary | ICD-10-CM | POA: Insufficient documentation

## 2015-03-07 DIAGNOSIS — K759 Inflammatory liver disease, unspecified: Secondary | ICD-10-CM | POA: Insufficient documentation

## 2015-03-07 DIAGNOSIS — R5383 Other fatigue: Secondary | ICD-10-CM | POA: Diagnosis not present

## 2015-03-07 DIAGNOSIS — D638 Anemia in other chronic diseases classified elsewhere: Secondary | ICD-10-CM | POA: Diagnosis not present

## 2015-03-07 DIAGNOSIS — Z923 Personal history of irradiation: Secondary | ICD-10-CM | POA: Diagnosis not present

## 2015-03-07 DIAGNOSIS — Z90710 Acquired absence of both cervix and uterus: Secondary | ICD-10-CM | POA: Insufficient documentation

## 2015-03-07 DIAGNOSIS — R1110 Vomiting, unspecified: Secondary | ICD-10-CM

## 2015-03-07 DIAGNOSIS — M6281 Muscle weakness (generalized): Secondary | ICD-10-CM | POA: Diagnosis not present

## 2015-03-07 DIAGNOSIS — I129 Hypertensive chronic kidney disease with stage 1 through stage 4 chronic kidney disease, or unspecified chronic kidney disease: Secondary | ICD-10-CM | POA: Diagnosis not present

## 2015-03-07 DIAGNOSIS — R110 Nausea: Secondary | ICD-10-CM | POA: Diagnosis present

## 2015-03-07 DIAGNOSIS — D649 Anemia, unspecified: Secondary | ICD-10-CM | POA: Insufficient documentation

## 2015-03-07 DIAGNOSIS — N183 Chronic kidney disease, stage 3 unspecified: Secondary | ICD-10-CM | POA: Diagnosis present

## 2015-03-07 DIAGNOSIS — Z853 Personal history of malignant neoplasm of breast: Secondary | ICD-10-CM | POA: Diagnosis not present

## 2015-03-07 DIAGNOSIS — F419 Anxiety disorder, unspecified: Secondary | ICD-10-CM | POA: Insufficient documentation

## 2015-03-07 DIAGNOSIS — R531 Weakness: Secondary | ICD-10-CM | POA: Diagnosis present

## 2015-03-07 DIAGNOSIS — I10 Essential (primary) hypertension: Secondary | ICD-10-CM | POA: Diagnosis present

## 2015-03-07 DIAGNOSIS — K219 Gastro-esophageal reflux disease without esophagitis: Secondary | ICD-10-CM | POA: Diagnosis not present

## 2015-03-07 DIAGNOSIS — R112 Nausea with vomiting, unspecified: Secondary | ICD-10-CM | POA: Insufficient documentation

## 2015-03-07 DIAGNOSIS — R55 Syncope and collapse: Secondary | ICD-10-CM | POA: Diagnosis not present

## 2015-03-07 HISTORY — DX: Rheumatoid arthritis, unspecified: M06.9

## 2015-03-07 LAB — TROPONIN I
Troponin I: <0.03 ng/mL (ref <0.031)
Troponin I: <0.03 ng/mL (ref <0.031)

## 2015-03-07 LAB — BASIC METABOLIC PANEL
ANION GAP: 10 (ref 5–15)
BUN: 15 mg/dL (ref 6–20)
CALCIUM: 10.1 mg/dL (ref 8.9–10.3)
CO2: 24 mmol/L (ref 22–32)
Chloride: 101 mmol/L (ref 101–111)
Creatinine, Ser: 1.36 mg/dL — ABNORMAL HIGH (ref 0.44–1.00)
GFR, EST AFRICAN AMERICAN: 41 mL/min — AB (ref >60)
GFR, EST NON AFRICAN AMERICAN: 36 mL/min — AB (ref >60)
GLUCOSE: 97 mg/dL (ref 65–99)
POTASSIUM: 4.6 mmol/L (ref 3.5–5.1)
Sodium: 135 mmol/L (ref 135–145)

## 2015-03-07 LAB — CBC
HCT: 36.3 % (ref 36.0–46.0)
Hemoglobin: 12.2 g/dL (ref 12.0–15.0)
MCH: 29.7 pg (ref 26.0–34.0)
MCHC: 33.6 g/dL (ref 30.0–36.0)
MCV: 88.3 fL (ref 78.0–100.0)
PLATELETS: 230 10*3/uL (ref 150–400)
RBC: 4.11 MIL/uL (ref 3.87–5.11)
RDW: 13.2 % (ref 11.5–15.5)
WBC: 5.5 10*3/uL (ref 4.0–10.5)

## 2015-03-07 LAB — URINALYSIS, ROUTINE W REFLEX MICROSCOPIC
Bilirubin Urine: NEGATIVE
Glucose, UA: NEGATIVE mg/dL
Hgb urine dipstick: NEGATIVE
KETONES UR: NEGATIVE mg/dL
NITRITE: NEGATIVE
Protein, ur: NEGATIVE mg/dL
Specific Gravity, Urine: 1.014 (ref 1.005–1.030)
UROBILINOGEN UA: 0.2 mg/dL (ref 0.0–1.0)
pH: 6.5 (ref 5.0–8.0)

## 2015-03-07 LAB — RAPID URINE DRUG SCREEN, HOSP PERFORMED
Amphetamines: NOT DETECTED
Barbiturates: NOT DETECTED
Benzodiazepines: NOT DETECTED
COCAINE: NOT DETECTED
OPIATES: NOT DETECTED
Tetrahydrocannabinol: NOT DETECTED

## 2015-03-07 LAB — TSH: TSH: 2.407 u[IU]/mL (ref 0.350–4.500)

## 2015-03-07 LAB — URINE MICROSCOPIC-ADD ON

## 2015-03-07 LAB — VITAMIN B12: VITAMIN B 12: 1084 pg/mL — AB (ref 180–914)

## 2015-03-07 MED ORDER — ENOXAPARIN SODIUM 30 MG/0.3ML ~~LOC~~ SOLN
30.0000 mg | SUBCUTANEOUS | Status: DC
  Administered 2015-03-07 – 2015-03-08 (×2): 30 mg via SUBCUTANEOUS
  Filled 2015-03-07 (×3): qty 0.3

## 2015-03-07 MED ORDER — SODIUM CHLORIDE 0.9 % IV SOLN
INTRAVENOUS | Status: DC
  Administered 2015-03-07 – 2015-03-08 (×3): via INTRAVENOUS
  Filled 2015-03-07: qty 1000

## 2015-03-07 MED ORDER — AMLODIPINE BESYLATE 5 MG PO TABS
5.0000 mg | ORAL_TABLET | Freq: Every day | ORAL | Status: DC
  Administered 2015-03-08: 5 mg via ORAL
  Filled 2015-03-07 (×3): qty 1

## 2015-03-07 MED ORDER — ANASTROZOLE 1 MG PO TABS
1.0000 mg | ORAL_TABLET | Freq: Every day | ORAL | Status: DC
  Administered 2015-03-08 – 2015-03-09 (×2): 1 mg via ORAL
  Filled 2015-03-07 (×2): qty 1

## 2015-03-07 MED ORDER — ONDANSETRON HCL 4 MG/2ML IJ SOLN: 4.0000 mg | Freq: Four times a day (QID) | INTRAMUSCULAR | Status: DC | PRN

## 2015-03-07 MED ORDER — FLUTICASONE PROPIONATE 50 MCG/ACT NA SUSP
2.0000 | Freq: Every day | NASAL | Status: DC | PRN
  Filled 2015-03-07: qty 16

## 2015-03-07 MED ORDER — VITAMIN D 1000 UNITS PO TABS
1000.0000 [IU] | ORAL_TABLET | Freq: Every day | ORAL | Status: DC
  Administered 2015-03-08 – 2015-03-09 (×2): 1000 [IU] via ORAL
  Filled 2015-03-07 (×2): qty 1

## 2015-03-07 MED ORDER — PANTOPRAZOLE SODIUM 40 MG PO TBEC
40.0000 mg | DELAYED_RELEASE_TABLET | Freq: Every day | ORAL | Status: DC
  Administered 2015-03-08 – 2015-03-09 (×2): 40 mg via ORAL
  Filled 2015-03-07: qty 1

## 2015-03-07 MED ORDER — ONDANSETRON HCL 4 MG PO TABS: 4.0000 mg | ORAL_TABLET | Freq: Four times a day (QID) | ORAL | Status: DC | PRN

## 2015-03-07 MED ORDER — ACETAMINOPHEN 650 MG RE SUPP: 650.0000 mg | Freq: Four times a day (QID) | RECTAL | Status: DC | PRN

## 2015-03-07 MED ORDER — ACETAMINOPHEN 325 MG PO TABS: 650.0000 mg | ORAL_TABLET | Freq: Four times a day (QID) | ORAL | Status: DC | PRN

## 2015-03-07 MED ORDER — OMEGA-3-ACID ETHYL ESTERS 1 G PO CAPS
1.0000 g | ORAL_CAPSULE | Freq: Every day | ORAL | Status: DC
  Administered 2015-03-08 – 2015-03-09 (×2): 1 g via ORAL
  Filled 2015-03-07 (×2): qty 1

## 2015-03-07 MED ORDER — CALCIUM CARBONATE 600 MG PO TABS: 600.0000 mg | ORAL_TABLET | Freq: Every day | ORAL | Status: DC

## 2015-03-07 MED ORDER — IRBESARTAN 150 MG PO TABS
150.0000 mg | ORAL_TABLET | Freq: Every day | ORAL | Status: DC
  Administered 2015-03-08 – 2015-03-09 (×2): 150 mg via ORAL
  Filled 2015-03-07 (×3): qty 1

## 2015-03-07 MED ORDER — SODIUM CHLORIDE 0.9 % IJ SOLN
3.0000 mL | Freq: Two times a day (BID) | INTRAMUSCULAR | Status: DC
  Administered 2015-03-07 – 2015-03-09 (×3): 3 mL via INTRAVENOUS

## 2015-03-07 MED ORDER — IBUPROFEN 400 MG PO TABS
400.0000 mg | ORAL_TABLET | Freq: Once | ORAL | Status: AC
  Administered 2015-03-07: 400 mg via ORAL
  Filled 2015-03-07: qty 1

## 2015-03-07 MED ORDER — FOLIC ACID 0.5 MG HALF TAB
0.5000 mg | ORAL_TABLET | Freq: Every day | ORAL | Status: DC
  Administered 2015-03-08 – 2015-03-09 (×2): 0.5 mg via ORAL
  Filled 2015-03-07 (×2): qty 1

## 2015-03-07 NOTE — ED Provider Notes (Signed)
CSN: [id number]     Arrival date & time 03/07/15  1004 History   None    Chief Complaint  Patient presents with  . Emesis  . Fatigue  Fatigue/Vomiting   (Consider location/radiation/quality/duration/timing/severity/associated sxs/prior Treatment) Patient is a 81 y.o. female presenting with vomiting. The history is provided by the patient and a relative. No language interpreter was used.  Emesis Associated symptoms: no sore throat    [name] is an 81 y.o female with a history of HTN, anemia, hepatitis, renal insufficiency, and breast cancer who presents for weakness and 1 episode of vomiting this morning. The patient was last seen in the ED for generalized weakness on 03/05/15 and 03/06/15.  She had labs and a CT scan of the head as well as a CXR. She states that prior to her last 2 hospitalizations she had a syncopal episode for a few seconds.  She states she was diaphoretic, clammy and weak while sitting on the toilet during the episode. Her son states this has happened to her multiple times while using the bathroom in the past couple of years. Her last BM was this morning with no loss of consciousness or near syncope. She denies any fever, chest pain, shortness of breath, abdominal pain, nausea, dizziness, urinary symptoms, or leg swelling. She denies any syncope or loss of consciousness.  Past Medical History  Diagnosis Date  . Anemia of chronic disease   . Anxiety   . Hypertension   . Hepatitis   . Lipid disorder   . Wears glasses   . Renal insufficiency   . Wears dentures     top  . Breast cancer 08/28/12    IDC left breast bx=invasive ductal ca,ER/PR=+  . Breast cancer 09/01/2012  . Radiation 11/12/12-12/15/11    Left Breast/50 Gy   Past Surgical History  Procedure Laterality Date  . Abdominal hysterectomy       approx 20 years ago  . Appendectomy      20 years ago  . Foot surgery  1992    bone spurs both feet  . Colonoscopy  2009?  [name] Partial mastectomy with needle  localization and axillary sentinel lymph node bx  10/08/2012    Procedure: PARTIAL MASTECTOMY WITH NEEDLE LOCALIZATION AND AXILLARY SENTINEL LYMPH NODE BX;  Surgeon: Adin Hector, MD;  Location: [place];  Service: General;  Laterality: Left;  Left partial mastectomy with Needle localization and Sentinel lymph node biospy  . Enteroscopy N/A 11/08/2013    Procedure: ENTEROSCOPY;  Surgeon: Beryle Beams, MD;  Location: [place];  Service: Endoscopy;  Laterality: N/A;  . Bones spur      removed 20 years ago  . Enteroscopy N/A 04/18/2014    Procedure: ENTEROSCOPY;  Surgeon: Beryle Beams, MD;  Location: [place];  Service: Endoscopy;  Laterality: N/A;   Family History  Problem Relation Age of Onset  . Cancer Father   . Prostate cancer Father   . Breast cancer Cousin   . Tuberculosis Mother    History  Substance Use Topics  . Smoking status: Former Smoker -- 1.00 packs/day    Types: Cigarettes    Quit date: 10/27/2007  . Smokeless tobacco: Never Used     Comment: started smoking age 71  . Alcohol Use: 0.6 oz/week    1 Glasses of wine per week     Comment:  glass wine per week   OB History    No data available  Review of Systems  HENT: Negative for sore throat.   Respiratory: Negative for cough.   Gastrointestinal: Positive for vomiting. Negative for constipation and blood in stool.  Genitourinary: Negative for dysuria, hematuria and difficulty urinating.  Neurological: Negative for syncope.  All other systems reviewed and are negative.     Allergies  Review of patient's allergies indicates no known allergies.  Home Medications   Prior to Admission medications   Medication Sig Start Date End Date Taking? Authorizing Provider  Amlodipine-Valsartan-HCTZ (EXFORGE HCT) 5-160-12.5 MG TABS Take 1 tablet by mouth daily.   Yes Historical Provider, MD  anastrozole (ARIMIDEX) 1 MG tablet Take 1 tablet (1 mg total) by mouth daily. 10/27/14  Yes Kelly Lose, MD  calcium carbonate (OS-CAL) 600 MG TABS Take 600 mg by mouth daily.   Yes Historical Provider, MD  Cholecalciferol (VITAMIN D-3) 1000 UNITS CAPS Take 1,000 Units by mouth daily.   Yes Historical Provider, MD  diclofenac sodium (VOLTAREN) 1 % GEL Apply 2 g topically 3 (three) times daily as needed. For arthritis pain   Yes Historical Provider, MD  fluticasone (FLONASE) 50 MCG/ACT nasal spray Place 2 sprays into both nostrils daily as needed for allergies or rhinitis. 11/08/13  Yes Ripudeep Krystal Eaton, MD  folic acid (FOLVITE) 263 MCG tablet Take 400-800 mcg by mouth daily.   Yes Historical Provider, MD  loratadine (CLARITIN) 10 MG tablet Take 1 tablet (10 mg total) by mouth daily as needed for allergies or rhinitis. 11/08/13  Yes Ripudeep Krystal Eaton, MD  Omega-3 Fatty Acids (FISH OIL) 500 MG CAPS Take 1 capsule by mouth daily.   Yes Historical Provider, MD  omeprazole (PRILOSEC) 20 MG capsule Take 1 capsule (20 mg total) by mouth daily. 11/08/13  Yes Ripudeep Krystal Eaton, MD  vitamin C (ASCORBIC ACID) 500 MG tablet Take 500 mg by mouth daily.   Yes Historical Provider, MD   BP 131/54 mmHg  Pulse 82  Temp(Src) 98 F (36.7 C) (Oral)  Resp 20  SpO2 97% Physical Exam  Constitutional: She is oriented to person, place, and time. She appears well-developed and well-nourished.  HENT:  Head: Normocephalic and atraumatic.  Eyes: Conjunctivae are normal.  Neck: Normal range of motion. Neck supple.  Cardiovascular: Normal rate, regular rhythm and normal heart sounds.   Pulmonary/Chest: Effort normal and breath sounds normal.  Abdominal: Soft. There is no tenderness.  Musculoskeletal: Normal range of motion.  Neurological: She is alert and oriented to person, place, and time.  Skin: Skin is warm and dry.  Nursing note and vitals reviewed.   ED Course  Procedures (including critical care time) Labs Review Labs Reviewed  BASIC METABOLIC PANEL - Abnormal; Notable for the following:    Creatinine, Ser  1.36 (*)    GFR calc non Af Amer 36 (*)    GFR calc Af Amer 41 (*)    All other components within normal limits  URINALYSIS, ROUTINE W REFLEX MICROSCOPIC - Abnormal; Notable for the following:    Leukocytes, UA TRACE (*)    All other components within normal limits  URINE MICROSCOPIC-ADD ON - Abnormal; Notable for the following:    Squamous Epithelial / LPF FEW (*)    All other components within normal limits  URINE CULTURE  CBC  TROPONIN I    Imaging Review Dg Chest 2 View  03/05/2015   CLINICAL DATA:  Weakness syncope and cough for 1 day.  EXAM: CHEST  2 VIEW  COMPARISON:  10/13/2014 and prior  chest radiographs  FINDINGS: Mild cardiomegaly again noted.  The cardiomediastinal silhouette is unremarkable.  Surgical clips overlying the left breast identified.  There is no evidence of focal airspace disease, pulmonary edema, suspicious pulmonary nodule/mass, pleural effusion, or pneumothorax.  No acute bony abnormalities are identified.  IMPRESSION: Mild cardiomegaly without evidence of acute cardiopulmonary disease.   Electronically Signed   By: Margarette Canada M.D.   On: 03/05/2015 16:12   Ct Head Wo Contrast  03/05/2015   CLINICAL DATA:  82 year old female with near syncope, weakness headache and nausea. History of breast cancer.  EXAM: CT HEAD WITHOUT CONTRAST  TECHNIQUE: Contiguous axial images were obtained from the base of the skull through the vertex without intravenous contrast.  COMPARISON:  07/17/2014  FINDINGS: Atrophy and mild chronic small-vessel white matter ischemic changes are again noted.  No acute intracranial abnormalities are identified, including mass lesion or mass effect, hydrocephalus, extra-axial fluid collection, midline shift, hemorrhage, or acute infarction.  The visualized bony calvarium is unremarkable.  IMPRESSION: No evidence of acute intracranial abnormality.  Mild atrophy and chronic small-vessel white matter ischemic changes.   Electronically Signed   By: Margarette Canada  M.D.   On: 03/05/2015 16:56   Ct Abdomen Pelvis W Contrast  03/06/2015   CLINICAL DATA:  Generalized weakness and tiredness. Lower abdominal pain. Initial encounter.  EXAM: CT ABDOMEN AND PELVIS WITH CONTRAST  TECHNIQUE: Multidetector CT imaging of the abdomen and pelvis was performed using the standard protocol following bolus administration of intravenous contrast.  CONTRAST:  44mL OMNIPAQUE IOHEXOL 300 MG/ML  SOLN  COMPARISON:  CT of the abdomen and pelvis performed 03/08/2014  FINDINGS: Emphysematous change is noted at the lung bases, with mild bronchiectasis at both lower lobes.  The liver and spleen are unremarkable in appearance. The gallbladder is within normal limits. The pancreas and adrenal glands are unremarkable.  A tiny 4 mm cyst is noted at the interpole region of the right kidney. The kidneys are otherwise unremarkable. There is no evidence of hydronephrosis. No renal or ureteral stones are seen. No perinephric stranding is appreciated.  No free fluid is identified. The small bowel is unremarkable in appearance. The stomach is within normal limits. No acute vascular abnormalities are seen. Relatively diffuse calcification is seen along the abdominal aorta and its branches.  The patient is status post appendectomy. The colon is unremarkable in appearance.  The bladder is mildly distended and grossly unremarkable. The patient is status post hysterectomy. No suspicious adnexal masses are seen. No inguinal lymphadenopathy is seen.  No acute osseous abnormalities are identified. There is grade 1 anterolisthesis of L3 on L4, and vacuum phenomenon is noted at L4-L5, with mild associated disc space narrowing. A chronic left-sided pars defect is noted at L5. Facet disease is noted at the mid lumbar spine.  IMPRESSION: 1. No acute abnormality seen to explain the patient's symptoms. 2. Relatively diffuse calcification along the abdominal aorta and its branches. 3. Emphysematous change at the lung bases, with  mild bronchiectasis at both lower lobes. 4. Tiny right renal cyst noted. 5. Chronic left-sided pars defect again noted at L5. Mild underlying degenerative change noted along the lumbar spine.   Electronically Signed   By: Garald Balding M.D.   On: 03/06/2015 20:06     EKG Interpretation   Date/Time:  Tuesday Mar 07 2015 11:29:06 EDT Ventricular Rate:  65 PR Interval:  178 QRS Duration: 75 QT Interval:  423 QTC Calculation: 440 R Axis:   25 Text  Interpretation:  Sinus rhythm Abnormal R-wave progression, early  transition Similar to prior Confirmed by [facility]  MD, [place] ([id number]) on  03/07/2015 11:31:47 AM      MDM   Final diagnoses:  Weakness   The patient presents to the ED for weakness for the 3rd day in a row. She has had a negative workup including a CT head, CT abdomen, labs and troponin. She was mildly anemic yesterday.   I do not think repeat imaging is necessary.  She has no specific complaint except weakness. She denies any fever, chills, headache, chest pain, shortness of breath, or nausea.  I spoke to Dr. Mingo Amber who had seen the patient on her initial evaluation in the ED after her syncopal episode and saw her in the ED today.  Dr. Mingo Amber recommended admission due to the patients age and unidentifiable source of weakness.   13:54 I spoke to Dr. Orson Eva who will see the patient in the ED and admit.    Ottie Glazier, PA-C 03/07/15 [place], MD 03/07/15 [phone]

## 2015-03-07 NOTE — ED Notes (Signed)
MD at bedside. 

## 2015-03-07 NOTE — H&P (Signed)
History and Physical  [name] OHY:[id number] DOB: 09-21-1932 DOA: 03/07/2015   PCP: Elyn Peers, MD  Referring Physician: ED/ Dr. Mingo Amber  Chief Complaint: Dyspnea on exertion, fatigue, generalized weakness  HPI:  81 year old female with a history of hypertension, hyperlipidemia, breast cancer presents with 3-4 day history of generalized weakness. The patient lives with her son and normally is able to and below without any assistive devices. The patient has had 3 ED visits in the past 3 days for similar complaints. Her complaints are quite vague and nonspecific. She describes a sensation of just "feeling tired".  Upon further questioning, it appears that the patient may have some dyspnea on exertion for the past 3-4 days, but she denies any chest discomfort, dizziness, headache, abdominal pain, dysuria, hematuria, hematochezia, melena. The patient did have an episode of nausea and vomiting after eating breakfast on the morning of admission. She occasionally feels some palpitations at rest, but she did not have any palpitations with her episodes of dyspnea on exertion. On 03/05/2015, the patient had a syncopal episode after getting up from using the commode. She states that she was down only for less than 30 seconds, but did not have bowel or bladder incontinence, nor did she bite her tongue. She has not had any further episodes. She denies any new medications, and she denies any changes in her medication doses in the past month. However, she endorses poor appetite and poor by mouth intake. There is no family history of sudden cardiac death.  The patient has not had any further syncopal episodes since 03/05/2015.  In the emergency department, the patient was afebrile and hemodynamically stable. BMP shows serum creatinine 1.36. CBC was unremarkable. Initial troponin was negative. Hepatic enzymes were minimally elevated with AST 58, ALT 43. Chest x-ray was negative for acute findings. CT of  abdomen and pelvis on 03/06/2015 showed bronchiectasis and emphysema the lung bases, but was otherwise negative for acute findings. CT of the brain was negative. Assessment/Plan:  Syncope  -No further episodes since 03/05/2015 -Likely vasovagal response -Repeat orthostatic vital signs Generalized weakness/fatigue/dyspnea on exertion -Chest x-ray is negative -Cycle troponins -Echocardiogram -Orthostatic vital signs -TSH -A.m. Cortisol -Urine drug screen -Urinalysis is negative for pyuria -Start IV fluids -Place on telemetry -Serum B12 -Hold HCTZ Essential hypertension -Continue amlodipine and ARB -Hold HCTZ CKD stage III -Baseline creatinine 1.2-1.6 History of breast cancer -Continue Arimidex Mild normocytic anemia -Hemoglobin is at baseline 11-12      Past Medical History  Diagnosis Date  . Anemia of chronic disease   . Anxiety   . Hypertension   . Hepatitis   . Lipid disorder   . Wears glasses   . Renal insufficiency   . Wears dentures     top  . Breast cancer 08/28/12    IDC left breast bx=invasive ductal ca,ER/PR=+  . Breast cancer 09/01/2012  . Radiation 11/12/12-12/15/11    Left Breast/50 Gy   Past Surgical History  Procedure Laterality Date  . Abdominal hysterectomy       approx 20 years ago  . Appendectomy      20 years ago  . Foot surgery  1992    bone spurs both feet  . Colonoscopy  2009?  [name] Partial mastectomy with needle localization and axillary sentinel lymph node bx  10/08/2012    Procedure: PARTIAL MASTECTOMY WITH NEEDLE LOCALIZATION AND AXILLARY SENTINEL LYMPH NODE BX;  Surgeon: Adin Hector, MD;  Location: [facility];  Service: General;  Laterality: Left;  Left partial mastectomy with Needle localization and Sentinel lymph node biospy  . Enteroscopy N/A 11/08/2013    Procedure: ENTEROSCOPY;  Surgeon: Beryle Beams, MD;  Location: [facility];  Service: Endoscopy;  Laterality: N/A;  . Bones spur      removed 20 years ago   . Enteroscopy N/A 04/18/2014    Procedure: ENTEROSCOPY;  Surgeon: Beryle Beams, MD;  Location: [facility];  Service: Endoscopy;  Laterality: N/A;   Social History:  reports that she quit smoking about 7 years ago. Her smoking use included Cigarettes. She smoked 1.00 pack per day. She has never used smokeless tobacco. She reports that she drinks about 0.6 oz of alcohol per week. She reports that she does not use illicit drugs.   Family History  Problem Relation Age of Onset  . Cancer Father   . Prostate cancer Father   . Breast cancer Cousin   . Tuberculosis Mother      No Known Allergies    Prior to Admission medications   Medication Sig Start Date End Date Taking? Authorizing Provider  Amlodipine-Valsartan-HCTZ (EXFORGE HCT) 5-160-12.5 MG TABS Take 1 tablet by mouth daily.   Yes Historical Provider, MD  anastrozole (ARIMIDEX) 1 MG tablet Take 1 tablet (1 mg total) by mouth daily. 10/27/14  Yes Nicholas Lose, MD  calcium carbonate (OS-CAL) 600 MG TABS Take 600 mg by mouth daily.   Yes Historical Provider, MD  Cholecalciferol (VITAMIN D-3) 1000 UNITS CAPS Take 1,000 Units by mouth daily.   Yes Historical Provider, MD  diclofenac sodium (VOLTAREN) 1 % GEL Apply 2 g topically 3 (three) times daily as needed. For arthritis pain   Yes Historical Provider, MD  fluticasone (FLONASE) 50 MCG/ACT nasal spray Place 2 sprays into both nostrils daily as needed for allergies or rhinitis. 11/08/13  Yes Ripudeep Krystal Eaton, MD  folic acid (FOLVITE) 254 MCG tablet Take 400-800 mcg by mouth daily.   Yes Historical Provider, MD  loratadine (CLARITIN) 10 MG tablet Take 1 tablet (10 mg total) by mouth daily as needed for allergies or rhinitis. 11/08/13  Yes Ripudeep Krystal Eaton, MD  Omega-3 Fatty Acids (FISH OIL) 500 MG CAPS Take 1 capsule by mouth daily.   Yes Historical Provider, MD  omeprazole (PRILOSEC) 20 MG capsule Take 1 capsule (20 mg total) by mouth daily. 11/08/13  Yes Ripudeep Krystal Eaton, MD  vitamin C (ASCORBIC  ACID) 500 MG tablet Take 500 mg by mouth daily.   Yes Historical Provider, MD    Review of Systems:  Constitutional:  No weight loss, night sweats, Fevers, chills, fatigue.  Head&Eyes: No headache.  No vision loss.  No eye pain or scotoma ENT:  No Difficulty swallowing,Tooth/dental problems,Sore throat,  No ear ache, post nasal drip,  Cardio-vascular:  No chest pain, Orthopnea, PND, swelling in lower extremities,  dizziness, palpitations  GI:  No  abdominal pain, nausea, vomiting, diarrhea, loss of appetite, hematochezia, melena, heartburn, indigestion, Resp:   No cough. No coughing up of blood .No wheezing.No chest wall deformity  Skin:  no rash or lesions.  GU:  no dysuria, change in color of urine, no urgency or frequency. No flank pain.  Musculoskeletal:  No joint pain or swelling. No decreased range of motion. No back pain.  Psych:  No change in mood or affect. No depression or anxiety. Neurologic: No headache, no dysesthesia, no focal weakness, no vision loss.   Physical Exam: Filed Vitals:   03/07/15 1215  03/07/15 1315 03/07/15 1330 03/07/15 1409  BP:  125/61 126/52 131/54  Pulse: 65   82  Temp:      TempSrc:      Resp: 18 22 13 20   SpO2: 100%   97%   General:  A&O x 3, NAD, nontoxic, pleasant/cooperative Head/Eye: No conjunctival hemorrhage, no icterus, [place]/AT, No nystagmus ENT:  No icterus,  No thrush, good dentition, no pharyngeal exudate Neck:  No masses, no lymphadenpathy, no bruits CV:  RRR, no rub, no gallop, no S3 Lung:  CTAB, good air movement, no wheeze, no rhonchi Abdomen: soft/NT, +BS, nondistended, no peritoneal signs Ext: No cyanosis, No rashes, No petechiae, No lymphangitis, No edema Neuro: CNII-XII intact, strength 4/5 in bilateral upper and lower extremities, no dysmetria  Labs on Admission:  Basic Metabolic Panel:  Recent Labs Lab 03/05/15 1514 03/06/15 1450 03/07/15 1109  NA 132* 135 135  K 4.7 4.9 4.6  CL 98* 100* 101  CO2 23 26 24     GLUCOSE 115* 97 97  BUN 24* 19 15  CREATININE 1.28* 1.20* 1.36*  CALCIUM 9.7 9.8 10.1   Liver Function Tests:  Recent Labs Lab 03/05/15 1514 03/06/15 1450  AST 60* 58*  ALT 41 43  ALKPHOS 104 106  BILITOT 0.6 0.6  PROT 8.1 8.2*  ALBUMIN 3.7 3.7    Recent Labs Lab 03/05/15 1514  LIPASE 72*   No results for input(s): AMMONIA in the last 168 hours. CBC:  Recent Labs Lab 03/05/15 1514 03/06/15 1450 03/07/15 1109  WBC 6.6 5.9 5.5  NEUTROABS  --  3.9  --   HGB 11.6* 11.5* 12.2  HCT 34.1* 33.9* 36.3  MCV 88.1 88.3 88.3  PLT 252 243 230   Cardiac Enzymes:  Recent Labs Lab 03/06/15 1827 03/07/15 1109  TROPONINI <0.03 <0.03   BNP: Invalid input(s): POCBNP CBG: No results for input(s): GLUCAP in the last 168 hours.  Radiological Exams on Admission: Dg Chest 2 View  03/05/2015   CLINICAL DATA:  Weakness syncope and cough for 1 day.  EXAM: CHEST  2 VIEW  COMPARISON:  10/13/2014 and prior chest radiographs  FINDINGS: Mild cardiomegaly again noted.  The cardiomediastinal silhouette is unremarkable.  Surgical clips overlying the left breast identified.  There is no evidence of focal airspace disease, pulmonary edema, suspicious pulmonary nodule/mass, pleural effusion, or pneumothorax.  No acute bony abnormalities are identified.  IMPRESSION: Mild cardiomegaly without evidence of acute cardiopulmonary disease.   Electronically Signed   By: Margarette Canada M.D.   On: 03/05/2015 16:12   Ct Head Wo Contrast  03/05/2015   CLINICAL DATA:  81 year old female with near syncope, weakness headache and nausea. History of breast cancer.  EXAM: CT HEAD WITHOUT CONTRAST  TECHNIQUE: Contiguous axial images were obtained from the base of the skull through the vertex without intravenous contrast.  COMPARISON:  07/17/2014  FINDINGS: Atrophy and mild chronic small-vessel white matter ischemic changes are again noted.  No acute intracranial abnormalities are identified, including mass lesion or  mass effect, hydrocephalus, extra-axial fluid collection, midline shift, hemorrhage, or acute infarction.  The visualized bony calvarium is unremarkable.  IMPRESSION: No evidence of acute intracranial abnormality.  Mild atrophy and chronic small-vessel white matter ischemic changes.   Electronically Signed   By: Margarette Canada M.D.   On: 03/05/2015 16:56   Ct Abdomen Pelvis W Contrast  03/06/2015   CLINICAL DATA:  Generalized weakness and tiredness. Lower abdominal pain. Initial encounter.  EXAM: CT ABDOMEN AND PELVIS  WITH CONTRAST  TECHNIQUE: Multidetector CT imaging of the abdomen and pelvis was performed using the standard protocol following bolus administration of intravenous contrast.  CONTRAST:  46mL OMNIPAQUE IOHEXOL 300 MG/ML  SOLN  COMPARISON:  CT of the abdomen and pelvis performed 03/08/2014  FINDINGS: Emphysematous change is noted at the lung bases, with mild bronchiectasis at both lower lobes.  The liver and spleen are unremarkable in appearance. The gallbladder is within normal limits. The pancreas and adrenal glands are unremarkable.  A tiny 4 mm cyst is noted at the interpole region of the right kidney. The kidneys are otherwise unremarkable. There is no evidence of hydronephrosis. No renal or ureteral stones are seen. No perinephric stranding is appreciated.  No free fluid is identified. The small bowel is unremarkable in appearance. The stomach is within normal limits. No acute vascular abnormalities are seen. Relatively diffuse calcification is seen along the abdominal aorta and its branches.  The patient is status post appendectomy. The colon is unremarkable in appearance.  The bladder is mildly distended and grossly unremarkable. The patient is status post hysterectomy. No suspicious adnexal masses are seen. No inguinal lymphadenopathy is seen.  No acute osseous abnormalities are identified. There is grade 1 anterolisthesis of L3 on L4, and vacuum phenomenon is noted at L4-L5, with mild  associated disc space narrowing. A chronic left-sided pars defect is noted at L5. Facet disease is noted at the mid lumbar spine.  IMPRESSION: 1. No acute abnormality seen to explain the patient's symptoms. 2. Relatively diffuse calcification along the abdominal aorta and its branches. 3. Emphysematous change at the lung bases, with mild bronchiectasis at both lower lobes. 4. Tiny right renal cyst noted. 5. Chronic left-sided pars defect again noted at L5. Mild underlying degenerative change noted along the lumbar spine.   Electronically Signed   By: Garald Balding M.D.   On: 03/06/2015 20:06    EKG: Independently reviewed. Sinus rhythm without ST-T wave change    Time spent:60 minutes Code Status:   FULL Family Communication:   Family updated at bedside   Kelly Alexie, DO  [facility] Pager [phone]  If 7PM-7AM, please contact night-coverage [email or web address] Password [email or web address] 03/07/2015, 2:18 PM

## 2015-03-07 NOTE — Telephone Encounter (Signed)
SPOKE TO [place], Lonell Grandchild. INSTRUCTED PT. TO CONTACT HER PRIMARY CARE PHYSICIAN. PT. HAS AN APPOINTMENT WITH DR. BLAND (PCP) ON Friday. PT. IS NOW "THROWING UP". INSTRUCTED PT. TO UPDATE DR.BLAND'S OFFICE OF HER SYMPTOMS TO SEE IF PT. COULD BE SEEN TODAY. PT. VOICES UNDERSTANDING.

## 2015-03-07 NOTE — Progress Notes (Signed)
Pt admitted to the unit at 1532. Pt mental status is A&OX4. Pt oriented to room, staff, and call bell. Skin is intact. Full assessment charted in [facility]. Call bell within reach. Visitor guidelines reviewed w/ pt and/or family.

## 2015-03-07 NOTE — ED Notes (Addendum)
Pt presents via POV c/o fatigue and emesis x 3 days.  Pt reports this is her 3rd day being here for the same symptoms.  Pt a x 4, NAD.  Pt denies chills or fever.

## 2015-03-07 NOTE — ED Notes (Signed)
Spoke with Lab, Troponin added on to labs.

## 2015-03-07 NOTE — Telephone Encounter (Signed)
PT. "ALMOST PASSED OUT" ON Sunday. SHE WENT TO THE EMERGENCY DEPARTMENT AND RECEIVED FLUIDS. YESTERDAY PT. CONTINUED TO FEEL WEAK AND NO ENERGY. SHE IS "BREATHING HARD ALL THE TIME". PT. WENT BACK TO THE EMERGENCY DEPARTMENT AND RECEIVED FLUIDS AGAIN.

## 2015-03-08 ENCOUNTER — Observation Stay (HOSPITAL_COMMUNITY): Payer: Medicare Other

## 2015-03-08 DIAGNOSIS — R110 Nausea: Secondary | ICD-10-CM | POA: Diagnosis present

## 2015-03-08 DIAGNOSIS — R55 Syncope and collapse: Secondary | ICD-10-CM

## 2015-03-08 DIAGNOSIS — N183 Chronic kidney disease, stage 3 (moderate): Secondary | ICD-10-CM | POA: Diagnosis not present

## 2015-03-08 DIAGNOSIS — R0609 Other forms of dyspnea: Secondary | ICD-10-CM | POA: Diagnosis not present

## 2015-03-08 DIAGNOSIS — D638 Anemia in other chronic diseases classified elsewhere: Secondary | ICD-10-CM | POA: Diagnosis not present

## 2015-03-08 DIAGNOSIS — K210 Gastro-esophageal reflux disease with esophagitis: Secondary | ICD-10-CM | POA: Diagnosis not present

## 2015-03-08 LAB — COMPREHENSIVE METABOLIC PANEL
ALT: 38 U/L (ref 14–54)
ANION GAP: 10 (ref 5–15)
AST: 49 U/L — ABNORMAL HIGH (ref 15–41)
Albumin: 3.2 g/dL — ABNORMAL LOW (ref 3.5–5.0)
Alkaline Phosphatase: 86 U/L (ref 38–126)
BUN: 14 mg/dL (ref 6–20)
CO2: 21 mmol/L — ABNORMAL LOW (ref 22–32)
Calcium: 9.3 mg/dL (ref 8.9–10.3)
Chloride: 105 mmol/L (ref 101–111)
Creatinine, Ser: 1.28 mg/dL — ABNORMAL HIGH (ref 0.44–1.00)
GFR calc Af Amer: 45 mL/min — ABNORMAL LOW (ref >60)
GFR calc non Af Amer: 38 mL/min — ABNORMAL LOW (ref >60)
Glucose, Bld: 95 mg/dL (ref 65–99)
POTASSIUM: 4.1 mmol/L (ref 3.5–5.1)
Sodium: 136 mmol/L (ref 135–145)
TOTAL PROTEIN: 7.2 g/dL (ref 6.5–8.1)
Total Bilirubin: 0.7 mg/dL (ref 0.3–1.2)

## 2015-03-08 LAB — URINE CULTURE
COLONY COUNT: NO GROWTH
CULTURE: NO GROWTH

## 2015-03-08 LAB — CORTISOL-AM, BLOOD: Cortisol - AM: 12.2 ug/dL (ref 6.7–22.6)

## 2015-03-08 NOTE — Evaluation (Signed)
Physical Therapy Evaluation Patient Details Name: [name] MRN: [id number] DOB: [birth year] Today's Date: 03/08/2015   History of Present Illness  Patient is an 81 y/o female admitted with hypertension, hyperlipidemia, breast cancer presented with 3-4 day history of generalized weakness and brief episode of syncope.  Clinical Impression  Patient presents with decreased mobility due to weakness and imbalance and will benefit from skilled PT in the acute setting to allow return home to independent level.  No current PT follow up recommended.  Patient has cane and may benefit from using until back to baseline for fall prevention.    Follow Up Recommendations No PT follow up    Equipment Recommendations  None recommended by PT    Recommendations for Other Services       Precautions / Restrictions Precautions Precautions: Fall      Mobility  Bed Mobility Overal bed mobility: Modified Independent                Transfers Overall transfer level: Modified independent               General transfer comment: stood from toilet independent   Ambulation/Gait Ambulation/Gait assistance: Supervision Ambulation Distance (Feet): 120 Feet Assistive device: None       General Gait Details: mildly unsteady but no loss of balance or need for physical help; reported fatigue limiting distance (pt just back from testing)  Stairs            Wheelchair Mobility    Modified Rankin (Stroke Patients Only)       Balance Overall balance assessment: Needs assistance         Standing balance support: No upper extremity supported Standing balance-Leahy Scale: Good Standing balance comment: stands to pull up underewear and wash hands unsupported                             Pertinent Vitals/Pain Pain Assessment: No/denies pain    Home Living Family/patient expects to be discharged to:: Private residence Living Arrangements: Children Available  Help at Discharge: Family;Available PRN/intermittently (son works starting 4:30 am and sometimes works 10-11 hours) Type of Home: House Home Access: Level entry     [place]: Two level;Able to live on main level with bedroom/bathroom Home Equipment: Gilford Rile - 2 wheels;Cane - single point      Prior Function Level of Independence: Independent         Comments: does not drive, but gets groceries and cooks     Journalist, newspaper        Extremity/Trunk Assessment               Lower Extremity Assessment: Overall WFL for tasks assessed      Cervical / Trunk Assessment: Kyphotic  Communication   Communication: No difficulties  Cognition Arousal/Alertness: Awake/alert Behavior During Therapy: WFL for tasks assessed/performed Overall Cognitive Status: Within Functional Limits for tasks assessed                      General Comments      Exercises        Assessment/Plan    PT Assessment Patient needs continued PT services  PT Diagnosis Generalized weakness   PT Problem List Decreased strength;Decreased activity tolerance;Decreased balance;Decreased knowledge of use of DME;Decreased mobility  PT Treatment Interventions DME instruction;Therapeutic exercise;Gait training;Balance training;Functional mobility training;Therapeutic activities;Patient/family education   PT Goals (Current goals can be found in the Care  Plan section) Acute Rehab PT Goals Patient Stated Goal: To get back to normal PT Goal Formulation: With patient Time For Goal Achievement: 03/15/15 Potential to Achieve Goals: Good    Frequency Min 3X/week   Barriers to discharge Decreased caregiver support      Co-evaluation               End of Session   Activity Tolerance: Patient limited by fatigue Patient left: in bed;with call bell/phone within reach      Functional Assessment Tool Used: Clinical Judgement Functional Limitation: Mobility: Walking and moving  around Mobility: Walking and Moving Around Current Status (L4650): At least 1 percent but less than 20 percent impaired, limited or restricted Mobility: Walking and Moving Around Goal Status [phone]): 0 percent impaired, limited or restricted    Time: 6812-7517 PT Time Calculation (min) (ACUTE ONLY): 14 min   Charges:   PT Evaluation $Initial PT Evaluation Tier I: 1 Procedure     PT G Codes:   PT G-Codes **NOT FOR INPATIENT CLASS** Functional Assessment Tool Used: Clinical Judgement Functional Limitation: Mobility: Walking and moving around Mobility: Walking and Moving Around Current Status (G0174): At least 1 percent but less than 20 percent impaired, limited or restricted Mobility: Walking and Moving Around Goal Status [phone]): 0 percent impaired, limited or restricted    [facility] 03/08/2015, 4:30 PM [place], [place] 03/08/2015

## 2015-03-08 NOTE — Progress Notes (Signed)
  Echocardiogram 2D Echocardiogram has been performed.  [name] 03/08/2015, 9:53 AM

## 2015-03-08 NOTE — Care Management Note (Signed)
Case Management Note  Patient Details  Name: Jamiria Langill MRN: [id number] Date of Birth: Sep 18, 1932  Subjective/Objective:      Patient from home with son, NCM will cont to follow dc needs.              Action/Plan:   Expected Discharge Date:                  Expected Discharge Plan:  [place]  In-House Referral:     Discharge planning Services  CM Consult  Post Acute Care Choice:    Choice offered to:     DME Arranged:    DME Agency:     HH Arranged:    [place] Agency:     Status of Service:  In process, will continue to follow  Medicare Important Message Given:  No Date Medicare IM Given:    Medicare IM give by:    Date Additional Medicare IM Given:    Additional Medicare Important Message give by:     If discussed at [place] of Stay Meetings, dates discussed:    Additional Comments:  Zenon Mayo, RN 03/08/2015, 11:44 AM

## 2015-03-08 NOTE — Progress Notes (Signed)
[facility]                                                                              Patient Demographics  [name], is a 81 y.o. female, DOB - [birth year], MMH:[id number]  Admit date - 03/07/2015   Admitting Physician Orson Eva, MD  Outpatient Primary MD for the patient is Elyn Peers, MD  LOS -    Chief Complaint  Patient presents with  . Emesis  . Fatigue       Brief HPI   81 year old female with hypertension, hyperlipidemia, breast cancer presented with 3-4 day history of generalized weakness. The patient lives with her son and normally is able to and below without any assistive devices. The patient has had 3 ED visits in the past 3 days for similar complaints. Her complaints are quite vague and nonspecific. She describes a sensation of just "feeling tired". Upon further questioning, it appears that the patient may have some dyspnea on exertion for the past 3-4 days, but she denied any chest discomfort, dizziness, headache, abdominal pain, dysuria, hematuria, hematochezia, melena. The patient did have an episode of nausea and vomiting after eating breakfast on the morning of admission. She occasionally feels some palpitations at rest, but she did not have any palpitations with her episodes of dyspnea on exertion. On 03/05/2015, the patient had a syncopal episode after getting up from using the commode. She stated that she was down only for less than 30 seconds, but did not have bowel or bladder incontinence, nor did she bite her tongue. She denied any new medications, and she denied any changes in her medication doses in the past month. However, she endorses poor appetite and poor by mouth intake. In the emergency department, the patient was afebrile and hemodynamically stable. BMP shows serum creatinine 1.36. CBC was unremarkable. Initial troponin was negative. Hepatic enzymes were minimally elevated with AST 58, ALT 43. Chest x-ray was negative for acute  findings. CT of abdomen and pelvis on 03/06/2015 showed bronchiectasis and emphysema the lung bases, but was otherwise negative for acute findings. CT of the brain was negative.   Assessment & Plan    Principal Problem:   Syncope - Likely due to dehydration, vasovagal response - 2-D echo pending - PTOT evaluation  Active Problems: Postprandial nausea and vomiting - Patient has noted nausea and vomiting after eating intermittently in the last 1 week. Will obtain barium esophagogram to rule out any esophageal obstruction or mass.  - Placed on Protonix - May benefit from gastric emptying study or a trial of Reglan  Generalized fatigue, dyspnea on exertion, - Follow 2-D echocardiogram, troponins negative, cortisol level, TSH normal - PTOT evaluation    Anemia of chronic disease - Currently stable, follow H&H, transfuse if hemoglobin less than 8    Hypertension -Currently stable, continue amlodipine    CKD (chronic kidney disease), stage III -Creatinine improving, appears to be at baseline    Code Status:Full code  Family Communication: Discussed in detail with the patient, all imaging results, lab results explained to the patient    Disposition Plan: Likely home in a.m. if stable  Time Spent in minutes 25  minutes  Procedures  2-D echo results pending  esophagogram   Consults   None  DVT Prophylaxis  lovenox   Medications  Scheduled Meds: . amLODipine  5 mg Oral Daily  . anastrozole  1 mg Oral Daily  . cholecalciferol  1,000 Units Oral Daily  . enoxaparin (LOVENOX) injection  30 mg Subcutaneous Q24H  . folic acid  0.5 mg Oral Daily  . irbesartan  150 mg Oral Daily  . omega-3 acid ethyl esters  1 g Oral Daily  . pantoprazole  40 mg Oral Daily  . sodium chloride  3 mL Intravenous Q12H   Continuous Infusions: . sodium chloride 0.9 % 1,000 mL infusion 75 mL/hr at 03/08/15 0425   PRN Meds:.acetaminophen **OR** acetaminophen, fluticasone, ondansetron **OR**  ondansetron (ZOFRAN) IV   Antibiotics   Anti-infectives    None        Subjective:   [name] was seen and examined today. Patient denies dizziness, chest pain, shortness of breath, abdominal pain, D/C, new weakness, numbess, tingling. No acute events overnight.  States that she threw up after her breakfast, has been happening intermittently for the last 1 week  Objective:   Blood pressure 133/45, pulse 70, temperature 98.4 F (36.9 C), temperature source Oral, resp. rate 20, height 5\' 2"  (1.575 m), weight 61.508 kg (135 lb 9.6 oz), SpO2 98 %.  Wt Readings from Last 3 Encounters:  03/07/15 61.508 kg (135 lb 9.6 oz)  11/24/14 62.188 kg (137 lb 1.6 oz)  10/27/14 61.598 kg (135 lb 12.8 oz)     Intake/Output Summary (Last 24 hours) at 03/08/15 1527 Last data filed at 03/08/15 1347  Gross per 24 hour  Intake 521.25 ml  Output   1400 ml  Net -878.75 ml    Exam  General: Alert and oriented x 3, NAD  HEENT:  PERRLA, EOMI, Anicteic Sclera, mucous membranes moist.   Neck: Supple, no JVD, no masses  CVS: S1 S2 auscultated, no rubs, murmurs or gallops. Regular rate and rhythm.  Respiratory: Clear to auscultation bilaterally, no wheezing, rales or rhonchi  Abdomen: Soft, nontender, nondistended, + bowel sounds  Ext: no cyanosis clubbing or edema  Neuro: AAOx3, Cr N's II- XII. Strength 5/5 upper and lower extremities bilaterally  Skin: No rashes  Psych: Normal affect and demeanor, alert and oriented x3    Data Review   Micro Results Recent Results (from the past 240 hour(s))  Urine culture     Status: None   Collection Time: 03/07/15 12:02 PM  Result Value Ref Range Status   Specimen Description URINE, RANDOM  Final   Special Requests NONE  Final   Colony Count NO GROWTH Performed at [facility]   Final   Culture NO GROWTH Performed at [facility]   Final   Report Status 03/08/2015 FINAL  Final    Radiology Reports Dg Chest 2  View  03/05/2015   CLINICAL DATA:  Weakness syncope and cough for 1 day.  EXAM: CHEST  2 VIEW  COMPARISON:  10/13/2014 and prior chest radiographs  FINDINGS: Mild cardiomegaly again noted.  The cardiomediastinal silhouette is unremarkable.  Surgical clips overlying the left breast identified.  There is no evidence of focal airspace disease, pulmonary edema, suspicious pulmonary nodule/mass, pleural effusion, or pneumothorax.  No acute bony abnormalities are identified.  IMPRESSION: Mild cardiomegaly without evidence of acute cardiopulmonary disease.   Electronically Signed   By: Cleatis Polka.D.  On: 03/05/2015 16:12   Ct Head Wo Contrast  03/05/2015   CLINICAL DATA:  81 year old female with near syncope, weakness headache and nausea. History of breast cancer.  EXAM: CT HEAD WITHOUT CONTRAST  TECHNIQUE: Contiguous axial images were obtained from the base of the skull through the vertex without intravenous contrast.  COMPARISON:  07/17/2014  FINDINGS: Atrophy and mild chronic small-vessel white matter ischemic changes are again noted.  No acute intracranial abnormalities are identified, including mass lesion or mass effect, hydrocephalus, extra-axial fluid collection, midline shift, hemorrhage, or acute infarction.  The visualized bony calvarium is unremarkable.  IMPRESSION: No evidence of acute intracranial abnormality.  Mild atrophy and chronic small-vessel white matter ischemic changes.   Electronically Signed   By: Margarette Canada M.D.   On: 03/05/2015 16:56   Ct Abdomen Pelvis W Contrast  03/06/2015   CLINICAL DATA:  Generalized weakness and tiredness. Lower abdominal pain. Initial encounter.  EXAM: CT ABDOMEN AND PELVIS WITH CONTRAST  TECHNIQUE: Multidetector CT imaging of the abdomen and pelvis was performed using the standard protocol following bolus administration of intravenous contrast.  CONTRAST:  13mL OMNIPAQUE IOHEXOL 300 MG/ML  SOLN  COMPARISON:  CT of the abdomen and pelvis performed 03/08/2014   FINDINGS: Emphysematous change is noted at the lung bases, with mild bronchiectasis at both lower lobes.  The liver and spleen are unremarkable in appearance. The gallbladder is within normal limits. The pancreas and adrenal glands are unremarkable.  A tiny 4 mm cyst is noted at the interpole region of the right kidney. The kidneys are otherwise unremarkable. There is no evidence of hydronephrosis. No renal or ureteral stones are seen. No perinephric stranding is appreciated.  No free fluid is identified. The small bowel is unremarkable in appearance. The stomach is within normal limits. No acute vascular abnormalities are seen. Relatively diffuse calcification is seen along the abdominal aorta and its branches.  The patient is status post appendectomy. The colon is unremarkable in appearance.  The bladder is mildly distended and grossly unremarkable. The patient is status post hysterectomy. No suspicious adnexal masses are seen. No inguinal lymphadenopathy is seen.  No acute osseous abnormalities are identified. There is grade 1 anterolisthesis of L3 on L4, and vacuum phenomenon is noted at L4-L5, with mild associated disc space narrowing. A chronic left-sided pars defect is noted at L5. Facet disease is noted at the mid lumbar spine.  IMPRESSION: 1. No acute abnormality seen to explain the patient's symptoms. 2. Relatively diffuse calcification along the abdominal aorta and its branches. 3. Emphysematous change at the lung bases, with mild bronchiectasis at both lower lobes. 4. Tiny right renal cyst noted. 5. Chronic left-sided pars defect again noted at L5. Mild underlying degenerative change noted along the lumbar spine.   Electronically Signed   By: Garald Balding M.D.   On: 03/06/2015 20:06    CBC  Recent Labs Lab 03/05/15 1514 03/06/15 1450 03/07/15 1109  WBC 6.6 5.9 5.5  HGB 11.6* 11.5* 12.2  HCT 34.1* 33.9* 36.3  PLT 252 243 230  MCV 88.1 88.3 88.3  MCH 30.0 29.9 29.7  MCHC 34.0 33.9 33.6    RDW 13.1 13.2 13.2  LYMPHSABS  --  1.5  --   MONOABS  --  0.4  --   EOSABS  --  0.1  --   BASOSABS  --  0.0  --     Chemistries   Recent Labs Lab 03/05/15 1514 03/06/15 1450 03/07/15 1109 03/08/15 0522  NA  132* 135 135 136  K 4.7 4.9 4.6 4.1  CL 98* 100* 101 105  CO2 23 26 24  21*  GLUCOSE 115* 97 97 95  BUN 24* 19 15 14   CREATININE 1.28* 1.20* 1.36* 1.28*  CALCIUM 9.7 9.8 10.1 9.3  AST 60* 58*  --  49*  ALT 41 43  --  38  ALKPHOS 104 106  --  86  BILITOT 0.6 0.6  --  0.7   ------------------------------------------------------------------------------------------------------------------ estimated creatinine clearance is 30.3 mL/min (by C-G formula based on Cr of 1.28). ------------------------------------------------------------------------------------------------------------------ No results for input(s): HGBA1C in the last 72 hours. ------------------------------------------------------------------------------------------------------------------ No results for input(s): CHOL, HDL, LDLCALC, TRIG, CHOLHDL, LDLDIRECT in the last 72 hours. ------------------------------------------------------------------------------------------------------------------  Recent Labs  03/07/15 1645  TSH 2.407   ------------------------------------------------------------------------------------------------------------------  Recent Labs  03/07/15 1645  VITAMINB12 1084*    Coagulation profile No results for input(s): INR, PROTIME in the last 168 hours.  No results for input(s): DDIMER in the last 72 hours.  Cardiac Enzymes  Recent Labs Lab 03/07/15 1109 03/07/15 1645 03/07/15 2238  TROPONINI <0.03 <0.03 <0.03   ------------------------------------------------------------------------------------------------------------------ Invalid input(s): POCBNP  No results for input(s): GLUCAP in the last 72 hours.   RAI,RIPUDEEP M.D. [facility] 03/08/2015, 3:27  PM  Pager: [phone]   Between 7am to 7pm - call Pager - [phone]  After 7pm go to [email or web address] - password [email or web address]  Call night coverage person covering after 7pm

## 2015-03-09 DIAGNOSIS — R55 Syncope and collapse: Secondary | ICD-10-CM | POA: Diagnosis not present

## 2015-03-09 DIAGNOSIS — D638 Anemia in other chronic diseases classified elsewhere: Secondary | ICD-10-CM | POA: Diagnosis not present

## 2015-03-09 DIAGNOSIS — R0609 Other forms of dyspnea: Secondary | ICD-10-CM | POA: Diagnosis not present

## 2015-03-09 DIAGNOSIS — N183 Chronic kidney disease, stage 3 (moderate): Secondary | ICD-10-CM | POA: Diagnosis not present

## 2015-03-09 MED ORDER — PANTOPRAZOLE SODIUM 40 MG PO TBEC: 40.0000 mg | DELAYED_RELEASE_TABLET | Freq: Every day | ORAL | Status: DC

## 2015-03-09 MED ORDER — METOCLOPRAMIDE HCL 5 MG PO TABS: 5.0000 mg | ORAL_TABLET | Freq: Three times a day (TID) | ORAL | Status: DC | PRN

## 2015-03-09 MED ORDER — METOCLOPRAMIDE HCL 5 MG PO TABS
5.0000 mg | ORAL_TABLET | Freq: Three times a day (TID) | ORAL | Status: DC
  Administered 2015-03-09: 5 mg via ORAL
  Filled 2015-03-09 (×4): qty 1

## 2015-03-09 NOTE — Progress Notes (Signed)
Pt verbalized understanding of discharge instruction. Pt IV dc with no complications. Pt education given on when to contact the physician.  Pt instructed when to follow up with physician and when their follow up appointments are. Pt dc with spouse and all belongings. Franchot Erichsen, RN

## 2015-03-09 NOTE — Discharge Summary (Signed)
Physician Discharge Summary   Patient ID: [name] MRN: [id number] DOB/AGE: 1932-07-29 81 y.o.  Admit date: 03/07/2015 Discharge date: 03/09/2015  Primary Care Physician:  Elyn Peers, MD  Discharge Diagnoses:    . Syncope   GERD . Postprandial nausea . Anemia of chronic disease . Hypertension . CKD (chronic kidney disease), stage III . Dyspnea on exertion   Consults: None   Recommendations for Outpatient Follow-up:  Upper GI series showed acid reflux, patient was started on PPI and low-dose Reglan prior to meals. If patient continues to have postprandial nausea, patient will need GI referral for possible endoscopy  TESTS THAT NEED FOLLOW-UP CBC, BMET at the time of follow-up   DIET: Heart healthy    Allergies:  No Known Allergies   Discharge Medications:   Medication List    STOP taking these medications        omeprazole 20 MG capsule  Commonly known as:  PRILOSEC  Replaced by:  pantoprazole 40 MG tablet      TAKE these medications        anastrozole 1 MG tablet  Commonly known as:  ARIMIDEX  Take 1 tablet (1 mg total) by mouth daily.     calcium carbonate 600 MG Tabs tablet  Commonly known as:  OS-CAL  Take 600 mg by mouth daily.     diclofenac sodium 1 % Gel  Commonly known as:  VOLTAREN  Apply 2 g topically 3 (three) times daily as needed. For arthritis pain     EXFORGE HCT 5-160-12.5 MG Tabs  Generic drug:  Amlodipine-Valsartan-HCTZ  Take 1 tablet by mouth daily.     Fish Oil 500 MG Caps  Take 1 capsule by mouth daily.     fluticasone 50 MCG/ACT nasal spray  Commonly known as:  FLONASE  Place 2 sprays into both nostrils daily as needed for allergies or rhinitis.     folic acid 742 MCG tablet  Commonly known as:  FOLVITE  Take 400-800 mcg by mouth daily.     loratadine 10 MG tablet  Commonly known as:  CLARITIN  Take 1 tablet (10 mg total) by mouth daily as needed for allergies or rhinitis.     metoCLOPramide 5 MG  tablet  Commonly known as:  REGLAN  Take 1 tablet (5 mg total) by mouth 3 (three) times daily with meals as needed for nausea or vomiting. Please take 15-60mins before the meal     pantoprazole 40 MG tablet  Commonly known as:  PROTONIX  Take 1 tablet (40 mg total) by mouth daily.     vitamin C 500 MG tablet  Commonly known as:  ASCORBIC ACID  Take 500 mg by mouth daily.     Vitamin D-3 1000 UNITS Caps  Take 1,000 Units by mouth daily.         Brief H and P: For complete details please refer to admission H and P, but in brief80 year old female with hypertension, hyperlipidemia, breast cancer presented with 3-4 day history of generalized weakness. The patient lives with her son and normally is able to and below without any assistive devices. The patient has had 3 ED visits in the past 3 days for similar complaints. Her complaints are quite vague and nonspecific. She describes a sensation of just "feeling tired". Upon further questioning, it appears that the patient may have some dyspnea on exertion for the past 3-4 days, but she denied any chest discomfort, dizziness, headache, abdominal pain, dysuria, hematuria, hematochezia, melena. The  patient did have an episode of nausea and vomiting after eating breakfast on the morning of admission. She occasionally feels some palpitations at rest, but she did not have any palpitations with her episodes of dyspnea on exertion. On 03/05/2015, the patient had a syncopal episode after getting up from using the commode. She stated that she was down only for less than 30 seconds, but did not have bowel or bladder incontinence, nor did she bite her tongue. She denied any new medications, and she denied any changes in her medication doses in the past month. However, she endorses poor appetite and poor by mouth intake. In the emergency department, the patient was afebrile and hemodynamically stable. BMP shows serum creatinine 1.36. CBC was unremarkable. Initial  troponin was negative. Hepatic enzymes were minimally elevated with AST 58, ALT 43. Chest x-ray was negative for acute findings. CT of abdomen and pelvis on 03/06/2015 showed bronchiectasis and emphysema the lung bases, but was otherwise negative for acute findings. CT of the brain was negative.  Hospital Course:   Syncope- Likely due to dehydration, vasovagal response She was admitted to telemetry, ruled out for acute ACS. Patient had no arrhythmias, troponins were negative, cortisol level was normal, B12 normal, TSH 2.4 - PTOT evaluation was done and showed that patient is at baseline. 2-D echo was done which showed EF of 65-70%, normal wall motion, grade 1 diastolic dysfunction  Postprandial nausea and vomiting - Patient has noted nausea and vomiting after eating intermittently in the last 1 week. Upper GI series was obtained which showed mild reflux and changes of mild reflux esophagitis. No definitive obstructive changes. Patient was placed on Protonix and low-dose Reglan for trial before meals. Recommended patient to follow up with PCP and gastroenterology referral if she continues to have symptoms after one month.  Generalized fatigue, dyspnea on exertion, 2-D echo showed EF of 65-70% with grade 1 diastolic dysfunction, patient is feeling back to her baseline. PTOT evaluation recommended no follow-up.   Anemia of chronic disease - Hemoglobin remained stable   Hypertension -Currently stable, continue amlodipine  Mild acute on CKD (chronic kidney disease), stage III -Creatinine improving, improved with IV fluids  Day of Discharge BP 132/53 mmHg  Pulse 74  Temp(Src) 98.7 F (37.1 C) (Oral)  Resp 18  Ht 5\' 2"  (1.575 m)  Wt 61.508 kg (135 lb 9.6 oz)  BMI 24.80 kg/m2  SpO2 97%  Physical Exam: General: Alert and awake oriented x3 not in any acute distress. HEENT: anicteric sclera, pupils reactive to light and accommodation CVS: S1-S2 clear no murmur rubs or gallops Chest:  clear to auscultation bilaterally, no wheezing rales or rhonchi Abdomen: soft nontender, nondistended, normal bowel sounds Extremities: no cyanosis, clubbing or edema noted bilaterally Neuro: Cranial nerves II-XII intact, no focal neurological deficits   The results of significant diagnostics from this hospitalization (including imaging, microbiology, ancillary and laboratory) are listed below for reference.    LAB RESULTS: Basic Metabolic Panel:  Recent Labs Lab 03/07/15 1109 03/08/15 0522  NA 135 136  K 4.6 4.1  CL 101 105  CO2 24 21*  GLUCOSE 97 95  BUN 15 14  CREATININE 1.36* 1.28*  CALCIUM 10.1 9.3   Liver Function Tests:  Recent Labs Lab 03/06/15 1450 03/08/15 0522  AST 58* 49*  ALT 43 38  ALKPHOS 106 86  BILITOT 0.6 0.7  PROT 8.2* 7.2  ALBUMIN 3.7 3.2*    Recent Labs Lab 03/05/15 1514  LIPASE 72*   No results  for input(s): AMMONIA in the last 168 hours. CBC:  Recent Labs Lab 03/06/15 1450 03/07/15 1109  WBC 5.9 5.5  NEUTROABS 3.9  --   HGB 11.5* 12.2  HCT 33.9* 36.3  MCV 88.3 88.3  PLT 243 230   Cardiac Enzymes:  Recent Labs Lab 03/07/15 1645 03/07/15 2238  TROPONINI <0.03 <0.03   BNP: Invalid input(s): POCBNP CBG: No results for input(s): GLUCAP in the last 168 hours.  Significant Diagnostic Studies:  Dg Ugi W/high Density W/kub  03/08/2015   CLINICAL DATA:  Abdominal pain and bloating  EXAM: UPPER GI SERIES WITH KUB  TECHNIQUE: After obtaining a scout radiograph a routine upper GI series was performed using high density barium.  FLUOROSCOPY TIME:  Radiation Exposure Index (as provided by the fluoroscopic device): 44.2 mGy  If the device does not provide the exposure index:  Fluoroscopy Time (in minutes and seconds):  1 minutes 24 seconds  Number of Acquired Images:  42  COMPARISON:  CT from 03/06/2015  FINDINGS: The plain film scout of the abdomen demonstrates contrast material within the colon from the recent CT examination. No  obstructive changes are seen. No free air is noted. Diffuse aortic calcifications are seen. No acute bony abnormality is noted.  Swallowing mechanism esophageal transit time are within normal limits. Mild reflux is noted. Some mild irregularity of the esophageal mucosa is noted consistent with mild esophagitis. No ulcers are seen.  The stomach distends well and demonstrates no intraluminal filling defect. No definitive mucosal abnormality is seen. Free flow contrast material into the proximal small bowel is noted. The duodenal bulb and duodenal sweep are within normal limits.  IMPRESSION: Mild reflux and changes of mild reflux esophagitis.  No other focal abnormality is seen. The visualized small bowel does not appear to be dilated. No definitive obstructive changes are noted on the plain film.   Electronically Signed   By: Inez Catalina M.D.   On: 03/08/2015 16:33    2D ECHO: Study Conclusions  - Left ventricle: The cavity size was normal. Wall thickness was increased in a pattern of mild LVH. Systolic function was vigorous. The estimated ejection fraction was in the range of 65% to 70%. Wall motion was normal; there were no regional wall motion abnormalities. Doppler parameters are consistent with abnormal left ventricular relaxation (grade 1 diastolic dysfunction). - Mitral valve: There was mild regurgitation.  Disposition and Follow-up:     Discharge Instructions    Diet - low sodium heart healthy    Complete by:  As directed      Increase activity slowly    Complete by:  As directed             DISPOSITION: home   DISCHARGE FOLLOW-UP Follow-up Information    Follow up with Elyn Peers, MD. Schedule an appointment as soon as possible for a visit on 03/15/2015.   Specialty:  Family Medicine   Why:  for hospital follow-up/Appointment with Dr. Criss Rosales is on 03/15/15 at 1:45   Contact information:   [employer] [address] [place] [address] [phone]         Time spent on Discharge: 25 mins  Signed:   Niclas Markell M.D. [facility] 03/09/2015, 2:20 PM Pager: [phone]

## 2015-03-10 DIAGNOSIS — I10 Essential (primary) hypertension: Secondary | ICD-10-CM | POA: Diagnosis not present

## 2015-03-10 DIAGNOSIS — I749 Embolism and thrombosis of unspecified artery: Secondary | ICD-10-CM | POA: Diagnosis not present

## 2015-03-10 DIAGNOSIS — B182 Chronic viral hepatitis C: Secondary | ICD-10-CM | POA: Diagnosis not present

## 2015-03-29 DIAGNOSIS — E860 Dehydration: Secondary | ICD-10-CM | POA: Diagnosis not present

## 2015-03-29 DIAGNOSIS — I10 Essential (primary) hypertension: Secondary | ICD-10-CM | POA: Diagnosis not present

## 2015-03-29 DIAGNOSIS — B182 Chronic viral hepatitis C: Secondary | ICD-10-CM | POA: Diagnosis not present

## 2015-03-29 DIAGNOSIS — R7309 Other abnormal glucose: Secondary | ICD-10-CM | POA: Diagnosis not present

## 2015-03-29 DIAGNOSIS — D508 Other iron deficiency anemias: Secondary | ICD-10-CM | POA: Diagnosis not present

## 2015-03-29 DIAGNOSIS — K7290 Hepatic failure, unspecified without coma: Secondary | ICD-10-CM | POA: Diagnosis not present

## 2015-05-11 ENCOUNTER — Ambulatory Visit
Admission: RE | Admit: 2015-05-11 | Discharge: 2015-05-11 | Disposition: A | Discharge status: Home / Self Care | Payer: Medicare Other | Source: Ambulatory Visit | Attending: Hematology and Oncology | Admitting: Hematology and Oncology

## 2015-05-11 DIAGNOSIS — M85852 Other specified disorders of bone density and structure, left thigh: Secondary | ICD-10-CM | POA: Diagnosis not present

## 2015-05-11 DIAGNOSIS — M8588 Other specified disorders of bone density and structure, other site: Secondary | ICD-10-CM | POA: Diagnosis not present

## 2015-05-11 DIAGNOSIS — C50912 Malignant neoplasm of unspecified site of left female breast: Secondary | ICD-10-CM

## 2015-05-15 ENCOUNTER — Emergency Department (HOSPITAL_COMMUNITY): Payer: Medicare Other

## 2015-05-15 ENCOUNTER — Encounter (HOSPITAL_COMMUNITY): Payer: Self-pay | Admitting: Family Medicine

## 2015-05-15 ENCOUNTER — Emergency Department (HOSPITAL_COMMUNITY)
Admission: EM | Admit: 2015-05-15 | Discharge: 2015-05-15 | Disposition: A | Discharge status: Home / Self Care | Payer: Medicare Other | Attending: Emergency Medicine | Admitting: Emergency Medicine

## 2015-05-15 DIAGNOSIS — I129 Hypertensive chronic kidney disease with stage 1 through stage 4 chronic kidney disease, or unspecified chronic kidney disease: Secondary | ICD-10-CM | POA: Diagnosis not present

## 2015-05-15 DIAGNOSIS — Z8719 Personal history of other diseases of the digestive system: Secondary | ICD-10-CM | POA: Diagnosis not present

## 2015-05-15 DIAGNOSIS — R112 Nausea with vomiting, unspecified: Secondary | ICD-10-CM | POA: Insufficient documentation

## 2015-05-15 DIAGNOSIS — Z8639 Personal history of other endocrine, nutritional and metabolic disease: Secondary | ICD-10-CM | POA: Insufficient documentation

## 2015-05-15 DIAGNOSIS — N189 Chronic kidney disease, unspecified: Secondary | ICD-10-CM | POA: Insufficient documentation

## 2015-05-15 DIAGNOSIS — Z862 Personal history of diseases of the blood and blood-forming organs and certain disorders involving the immune mechanism: Secondary | ICD-10-CM | POA: Insufficient documentation

## 2015-05-15 DIAGNOSIS — M069 Rheumatoid arthritis, unspecified: Secondary | ICD-10-CM | POA: Diagnosis not present

## 2015-05-15 DIAGNOSIS — Z79899 Other long term (current) drug therapy: Secondary | ICD-10-CM | POA: Diagnosis not present

## 2015-05-15 DIAGNOSIS — R5383 Other fatigue: Secondary | ICD-10-CM | POA: Insufficient documentation

## 2015-05-15 DIAGNOSIS — J069 Acute upper respiratory infection, unspecified: Secondary | ICD-10-CM

## 2015-05-15 DIAGNOSIS — Z853 Personal history of malignant neoplasm of breast: Secondary | ICD-10-CM | POA: Diagnosis not present

## 2015-05-15 DIAGNOSIS — R079 Chest pain, unspecified: Secondary | ICD-10-CM

## 2015-05-15 DIAGNOSIS — Z8659 Personal history of other mental and behavioral disorders: Secondary | ICD-10-CM | POA: Diagnosis not present

## 2015-05-15 DIAGNOSIS — Z87891 Personal history of nicotine dependence: Secondary | ICD-10-CM | POA: Diagnosis not present

## 2015-05-15 DIAGNOSIS — R05 Cough: Secondary | ICD-10-CM | POA: Diagnosis not present

## 2015-05-15 LAB — CBC
HCT: 34.1 % — ABNORMAL LOW (ref 36.0–46.0)
Hemoglobin: 11.2 g/dL — ABNORMAL LOW (ref 12.0–15.0)
MCH: 29.1 pg (ref 26.0–34.0)
MCHC: 32.8 g/dL (ref 30.0–36.0)
MCV: 88.6 fL (ref 78.0–100.0)
PLATELETS: 302 10*3/uL (ref 150–400)
RBC: 3.85 MIL/uL — ABNORMAL LOW (ref 3.87–5.11)
RDW: 13.4 % (ref 11.5–15.5)
WBC: 6 10*3/uL (ref 4.0–10.5)

## 2015-05-15 LAB — URINALYSIS, ROUTINE W REFLEX MICROSCOPIC
Bilirubin Urine: NEGATIVE
Glucose, UA: NEGATIVE mg/dL
HGB URINE DIPSTICK: NEGATIVE
KETONES UR: NEGATIVE mg/dL
Nitrite: NEGATIVE
Protein, ur: NEGATIVE mg/dL
SPECIFIC GRAVITY, URINE: 1.015 (ref 1.005–1.030)
Urobilinogen, UA: 0.2 mg/dL (ref 0.0–1.0)
pH: 7.5 (ref 5.0–8.0)

## 2015-05-15 LAB — BASIC METABOLIC PANEL
ANION GAP: 10 (ref 5–15)
BUN: 12 mg/dL (ref 6–20)
CALCIUM: 9.8 mg/dL (ref 8.9–10.3)
CHLORIDE: 99 mmol/L — AB (ref 101–111)
CO2: 24 mmol/L (ref 22–32)
CREATININE: 1.41 mg/dL — AB (ref 0.44–1.00)
GFR calc Af Amer: 39 mL/min — ABNORMAL LOW (ref >60)
GFR calc non Af Amer: 34 mL/min — ABNORMAL LOW (ref >60)
GLUCOSE: 119 mg/dL — AB (ref 65–99)
Potassium: 5.1 mmol/L (ref 3.5–5.1)
SODIUM: 133 mmol/L — AB (ref 135–145)

## 2015-05-15 LAB — URINE MICROSCOPIC-ADD ON

## 2015-05-15 LAB — HEPATIC FUNCTION PANEL
ALBUMIN: 3.4 g/dL — AB (ref 3.5–5.0)
ALT: 14 U/L (ref 14–54)
AST: 29 U/L (ref 15–41)
Alkaline Phosphatase: 91 U/L (ref 38–126)
BILIRUBIN TOTAL: 0.4 mg/dL (ref 0.3–1.2)
Total Protein: 7.6 g/dL (ref 6.5–8.1)

## 2015-05-15 MED ORDER — SODIUM CHLORIDE 0.9 % IV BOLUS (SEPSIS)
1000.0000 mL | Freq: Once | INTRAVENOUS | Status: AC
  Administered 2015-05-15: 1000 mL via INTRAVENOUS

## 2015-05-15 NOTE — ED Notes (Addendum)
Pt here for cough, weakness over the past few days. sts hx of anemia and she feels this way when her iron is low. sts also some lower abdominal pain and vomiting.

## 2015-05-15 NOTE — ED Provider Notes (Signed)
5:30 PM urinalysis unremarkable. She continues to appear well. Plan for discharge home per previous physician's examination.   Pamella Pert, MD 05/15/15 [phone]

## 2015-05-15 NOTE — ED Provider Notes (Signed)
CSN: [id number]     Arrival date & time 05/15/15  1024 History   First MD Initiated Contact with Patient 05/15/15 1323     Chief Complaint  Patient presents with  . Cough  . Weakness  . Anemia     (Consider location/radiation/quality/duration/timing/severity/associated sxs/prior Treatment) HPI  [name] is an 81 yo F with PMH of anemia of chronic disease, hypertension, CKD, RA, and breast cancer s/p lumpectomy and radiation in 2014 now on Arimidex who presents with 3 day history of generalized fatigue and malaise as well as cough productive of yellow/green sputum. This morning, she also felt nauseous and vomited up her breakfast but denied abdominal pain ither than occasional right sided cramping that has been going on for 'years'. She has been afebrile, denies headache, changes in vision, chest pain, shortness of breath, any changes in bowel or urinary function, or any other symptoms. She denies sick contact or recent travel. She has no other issues at this time. Of note, patient told nursing that she 'was told she had hepatitis but no one is treating me or has told me anything about it'.  Past Medical History  Diagnosis Date  . Anemia of chronic disease   . Anxiety   . Hypertension   . Hepatitis   . Lipid disorder   . Wears glasses   . Renal insufficiency   . Wears dentures     top  . Breast cancer 08/28/12    IDC left breast bx=invasive ductal ca,ER/PR=+  . Breast cancer 09/01/2012  . Radiation 11/12/12-12/15/11    Left Breast/50 Gy  . RA (rheumatoid arthritis)    Past Surgical History  Procedure Laterality Date  . Abdominal hysterectomy       approx 20 years ago  . Appendectomy      20 years ago  . Foot surgery  1992    bone spurs both feet  . Colonoscopy  2009?  [name] Partial mastectomy with needle localization and axillary sentinel lymph node bx  10/08/2012    Procedure: PARTIAL MASTECTOMY WITH NEEDLE LOCALIZATION AND AXILLARY SENTINEL LYMPH NODE BX;  Surgeon: Adin Hector, MD;  Location: [facility];  Service: General;  Laterality: Left;  Left partial mastectomy with Needle localization and Sentinel lymph node biospy  . Enteroscopy N/A 11/08/2013    Procedure: ENTEROSCOPY;  Surgeon: Beryle Beams, MD;  Location: [place];  Service: Endoscopy;  Laterality: N/A;  . Bones spur      removed 20 years ago  . Enteroscopy N/A 04/18/2014    Procedure: ENTEROSCOPY;  Surgeon: Beryle Beams, MD;  Location: [place];  Service: Endoscopy;  Laterality: N/A;   Family History  Problem Relation Age of Onset  . Cancer Father   . Prostate cancer Father   . Breast cancer Cousin   . Tuberculosis Mother    History  Substance Use Topics  . Smoking status: Former Smoker -- 1.00 packs/day    Types: Cigarettes    Quit date: 10/27/2007  . Smokeless tobacco: Never Used     Comment: started smoking age 15  . Alcohol Use: 0.6 oz/week    1 Glasses of wine per week     Comment:  glass wine per week   OB History    No data available     Review of Systems  Constitutional: Positive for fatigue. Negative for fever, chills and diaphoresis.  HENT: Negative for ear pain, sinus pressure and sore throat.   Eyes: Negative for  visual disturbance.  Respiratory: Positive for cough. Negative for shortness of breath and wheezing.   Cardiovascular: Negative for chest pain, palpitations and leg swelling.  Gastrointestinal: Positive for vomiting. Negative for nausea, abdominal pain, diarrhea, constipation and blood in stool.  Endocrine: Negative for polyuria.  Genitourinary: Negative for dysuria, urgency, frequency, flank pain, decreased urine volume and difficulty urinating.  Musculoskeletal: Negative for back pain and gait problem.  Skin: Negative for rash.  Neurological: Negative for dizziness, syncope, weakness, numbness and headaches.  Hematological: Does not bruise/bleed easily.  Psychiatric/Behavioral: Negative for confusion and agitation.  All other  systems reviewed and are negative.     Allergies  Review of patient's allergies indicates no known allergies.  Home Medications   Prior to Admission medications   Medication Sig Start Date End Date Taking? Authorizing Provider  Amlodipine-Valsartan-HCTZ (EXFORGE HCT) 5-160-12.5 MG TABS Take 1 tablet by mouth daily.    Historical Provider, MD  anastrozole (ARIMIDEX) 1 MG tablet Take 1 tablet (1 mg total) by mouth daily. 10/27/14   Nicholas Lose, MD  calcium carbonate (OS-CAL) 600 MG TABS Take 600 mg by mouth daily.    Historical Provider, MD  Cholecalciferol (VITAMIN D-3) 1000 UNITS CAPS Take 1,000 Units by mouth daily.    Historical Provider, MD  diclofenac sodium (VOLTAREN) 1 % GEL Apply 2 g topically 3 (three) times daily as needed. For arthritis pain    Historical Provider, MD  fluticasone (FLONASE) 50 MCG/ACT nasal spray Place 2 sprays into both nostrils daily as needed for allergies or rhinitis. 11/08/13   Ripudeep Krystal Eaton, MD  folic acid (FOLVITE) 782 MCG tablet Take 400-800 mcg by mouth daily.    Historical Provider, MD  loratadine (CLARITIN) 10 MG tablet Take 1 tablet (10 mg total) by mouth daily as needed for allergies or rhinitis. 11/08/13   Ripudeep Krystal Eaton, MD  metoCLOPramide (REGLAN) 5 MG tablet Take 1 tablet (5 mg total) by mouth 3 (three) times daily with meals as needed for nausea or vomiting. Please take 15-82mins before the meal 03/09/15   Ripudeep Krystal Eaton, MD  Omega-3 Fatty Acids (FISH OIL) 500 MG CAPS Take 1 capsule by mouth daily.    Historical Provider, MD  pantoprazole (PROTONIX) 40 MG tablet Take 1 tablet (40 mg total) by mouth daily. 03/09/15   Ripudeep Krystal Eaton, MD  vitamin C (ASCORBIC ACID) 500 MG tablet Take 500 mg by mouth daily.    Historical Provider, MD   BP 128/75 mmHg  Pulse 67  Temp(Src) 98 F (36.7 C)  Resp 16  SpO2 100% Physical Exam  Constitutional: She is oriented to person, place, and time. She appears well-nourished. No distress.  HENT:  Head: Normocephalic  and atraumatic.  Right Ear: External ear normal.  Left Ear: External ear normal.  Eyes: Conjunctivae are normal. Pupils are equal, round, and reactive to light.  Neck: Normal range of motion. Neck supple.  Cardiovascular: Normal rate, regular rhythm, normal heart sounds and intact distal pulses.  Exam reveals no gallop and no friction rub.   No murmur heard. Pulmonary/Chest: Effort normal and breath sounds normal. She has no wheezes. She has no rales.  Abdominal: Soft. Bowel sounds are normal. She exhibits no distension. There is no tenderness.  Musculoskeletal: Normal range of motion. She exhibits no edema or tenderness.  Neurological: She is alert and oriented to person, place, and time. She has normal reflexes. No cranial nerve deficit. Coordination normal.  Skin: Skin is warm. No rash noted. She is  not diaphoretic. No erythema.  Psychiatric: She has a normal mood and affect. Her behavior is normal.  Nursing note and vitals reviewed.   ED Course  Procedures (including critical care time) Labs Review Labs Reviewed  BASIC METABOLIC PANEL - Abnormal; Notable for the following:    Sodium 133 (*)    Chloride 99 (*)    Glucose, Bld 119 (*)    Creatinine, Ser 1.41 (*)    GFR calc non Af Amer 34 (*)    GFR calc Af Amer 39 (*)    All other components within normal limits  CBC - Abnormal; Notable for the following:    RBC 3.85 (*)    Hemoglobin 11.2 (*)    HCT 34.1 (*)    All other components within normal limits  HEPATIC FUNCTION PANEL - Abnormal; Notable for the following:    Albumin 3.4 (*)    Bilirubin, Direct <0.1 (*)    All other components within normal limits  URINALYSIS, ROUTINE W REFLEX MICROSCOPIC (NOT AT [facility])    Imaging Review Dg Chest 2 View  05/15/2015   CLINICAL DATA:  Patient here for cough and weakness over the past few days. History of anemia.  EXAM: CHEST  2 VIEW  COMPARISON:  03/05/2015 chest radiograph  FINDINGS: The heart size and mediastinal contours are  within normal limits. Both lungs are clear. The visualized skeletal structures are unremarkable.  Surgical clips overlie the LEFT breast.  Calcified tortuous aorta.  IMPRESSION: No active cardiopulmonary disease.   Electronically Signed   By: Staci Righter M.D.   On: 05/15/2015 11:26     EKG Interpretation None      MDM   Final diagnoses:  Upper respiratory infection    Mrs. Liptak is a 81yo F with PMH of anemia of chronic disease, hypertension, CKD, RA, and breast cancer s/p lumpectomy and radiation in 2014 now on Arimidex who presents with 3 day history of generalized fatigue and malaise as well as cough productive of yellow/green sputum as well as one episode of vomiting without other GI symptoms this morning and a benign abdominal exam. She is afebrile and hemodynamically stable here. H and H are 11.2 and 34.1 today, respectively, which is stable and at her baseline, Cr stable at 1.4 and she has no signs/symptoms of bleeding. CXR is negative. Of note, her LFTs which were previously elevated have since resolved. UA pending. Likely due to a URI, she also notes that she feels this way when she's dehydrated. Will give 1L NS bolus here and assess response. If UA clear and patient responds well to bolus, patient should be safe for discharge.    Norval Gable, MD 05/15/15 [place], MD 05/15/15 1505  Norval Gable, MD 05/15/15 1531  Wandra Arthurs, MD 05/15/15 1537

## 2015-05-15 NOTE — ED Notes (Addendum)
Pt states "they keep saying I have hepatitis but no one is treating me for it or has told me anything about it!"

## 2015-05-17 ENCOUNTER — Telehealth: Payer: Self-pay

## 2015-05-17 NOTE — Telephone Encounter (Signed)
Bone Density results dtd 04/11/15 rcvd from [facility].  Reviewed by Dr. Lindi Adie.  Sent to scan.

## 2015-05-29 ENCOUNTER — Telehealth: Payer: Self-pay | Admitting: Hematology and Oncology

## 2015-05-29 ENCOUNTER — Ambulatory Visit (HOSPITAL_BASED_OUTPATIENT_CLINIC_OR_DEPARTMENT_OTHER): Payer: Medicare Other | Admitting: Hematology and Oncology

## 2015-05-29 ENCOUNTER — Encounter: Payer: Self-pay | Admitting: Hematology and Oncology

## 2015-05-29 ENCOUNTER — Other Ambulatory Visit (HOSPITAL_BASED_OUTPATIENT_CLINIC_OR_DEPARTMENT_OTHER): Payer: Medicare Other

## 2015-05-29 VITALS — BP 132/42 | HR 80 | Temp 98.1°F | Resp 18 | Ht 62.0 in | Wt 133.6 lb

## 2015-05-29 DIAGNOSIS — M8580 Other specified disorders of bone density and structure, unspecified site: Secondary | ICD-10-CM | POA: Diagnosis not present

## 2015-05-29 DIAGNOSIS — Z170 Estrogen receptor positive status [ER+]: Secondary | ICD-10-CM | POA: Diagnosis not present

## 2015-05-29 DIAGNOSIS — C50112 Malignant neoplasm of central portion of left female breast: Secondary | ICD-10-CM

## 2015-05-29 DIAGNOSIS — C50912 Malignant neoplasm of unspecified site of left female breast: Secondary | ICD-10-CM

## 2015-05-29 LAB — COMPREHENSIVE METABOLIC PANEL (CC13)
ALT: 20 U/L (ref 0–55)
AST: 31 U/L (ref 5–34)
Albumin: 3.5 g/dL (ref 3.5–5.0)
Alkaline Phosphatase: 115 U/L (ref 40–150)
Anion Gap: 9 mEq/L (ref 3–11)
BILIRUBIN TOTAL: 0.38 mg/dL (ref 0.20–1.20)
BUN: 22.5 mg/dL (ref 7.0–26.0)
CO2: 25 meq/L (ref 22–29)
Calcium: 10.1 mg/dL (ref 8.4–10.4)
Chloride: 106 mEq/L (ref 98–109)
Creatinine: 1.7 mg/dL — ABNORMAL HIGH (ref 0.6–1.1)
EGFR: 32 mL/min/{1.73_m2} — ABNORMAL LOW (ref >90)
Glucose: 76 mg/dl (ref 70–140)
POTASSIUM: 4.7 meq/L (ref 3.5–5.1)
Sodium: 141 mEq/L (ref 136–145)
Total Protein: 7.9 g/dL (ref 6.4–8.3)

## 2015-05-29 LAB — CBC WITH DIFFERENTIAL/PLATELET
BASO%: 0.5 % (ref 0.0–2.0)
Basophils Absolute: 0 10*3/uL (ref 0.0–0.1)
EOS ABS: 0.3 10*3/uL (ref 0.0–0.5)
EOS%: 4.2 % (ref 0.0–7.0)
HEMATOCRIT: 33 % — AB (ref 34.8–46.6)
HGB: 11 g/dL — ABNORMAL LOW (ref 11.6–15.9)
LYMPH#: 0.9 10*3/uL (ref 0.9–3.3)
LYMPH%: 15.7 % (ref 14.0–49.7)
MCH: 29.4 pg (ref 25.1–34.0)
MCHC: 33.3 g/dL (ref 31.5–36.0)
MCV: 88.2 fL (ref 79.5–101.0)
MONO#: 0.8 10*3/uL (ref 0.1–0.9)
MONO%: 12.9 % (ref 0.0–14.0)
NEUT#: 4 10*3/uL (ref 1.5–6.5)
NEUT%: 66.7 % (ref 38.4–76.8)
Platelets: 282 10*3/uL (ref 145–400)
RBC: 3.74 10*6/uL (ref 3.70–5.45)
RDW: 13.7 % (ref 11.2–14.5)
WBC: 6 10*3/uL (ref 3.9–10.3)

## 2015-05-29 NOTE — Telephone Encounter (Signed)
Gave avs & calendar for August 2017 °

## 2015-05-29 NOTE — Assessment & Plan Note (Signed)
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  Breast cancer surveillance: 1. Mammograms February 2015 revealed benign-appearing intramammary lymph node in the right side was evaluated by ultrasound and felt to be not concerning 2. Physical examination 10/27/2014: Palpable abnormality in the left breast at the lateral end of the previous scar tissue. This was evaluated by mammogram and ultrasound 11/03/2014 and was found to be benign area  Arimidex toxicities: Patient does not have any major toxicities from Arimidex.  Her bone density test 05/11/2015: T score -1.5 (mild osteopenia), continue with calcium and vitamin D  Return to clinic in 6 months for follow-up

## 2015-05-29 NOTE — Progress Notes (Signed)
Patient Care Team: Lucianne Lei, MD as PCP - General (Family Medicine)  DIAGNOSIS: No matching staging information was found for the patient.  SUMMARY OF ONCOLOGIC HISTORY:   Breast cancer, left breast   08/25/2012 Initial Diagnosis Breast cancer, left breast: Invasive ductal carcinoma grade 1-2 ER/PR positive HER-2 negative Ki-67 14%   10/08/2012 Surgery Left lumpectomy and sentinel lymph node biopsy: 2.1 cm invasive ductal carcinoma 3 sentinel lymph nodes negative posterior margin reexcision ER positive PR positive HER-2 negative Ki-67 14% grade 1-2   11/10/2012 - 12/11/2012 Radiation Therapy Adjuvant radiation therapy   12/28/2012 -  Anti-estrogen oral therapy Arimidex 1 mg daily    CHIEF COMPLIANT: Follow-up of left breast cancer  INTERVAL HISTORY: [name] is a 81 year old above-mentioned history left breast cancer currently on Arimidex antiestrogen therapy since March 2014. She is tolerating it extremely well with occasional hot flashes. Denies myalgias. She denies any lumps or nodules in the breasts.  REVIEW OF SYSTEMS:   Constitutional: Denies fevers, chills or abnormal weight loss Eyes: Denies blurriness of vision Ears, nose, mouth, throat, and face: Denies mucositis or sore throat Respiratory: Denies cough, dyspnea or wheezes Cardiovascular: Denies palpitation, chest discomfort or lower extremity swelling Gastrointestinal:  Denies nausea, heartburn or change in bowel habits Skin: Denies abnormal skin rashes Lymphatics: Denies new lymphadenopathy or easy bruising Neurological:Denies numbness, tingling or new weaknesses Behavioral/Psych: Mood is stable, no new changes  Breast:  denies any pain or lumps or nodules in either breasts All other systems were reviewed with the patient and are negative.  I have reviewed the past medical history, past surgical history, social history and family history with the patient and they are unchanged from previous note.  ALLERGIES:   has No Known Allergies.  MEDICATIONS:  Current Outpatient Prescriptions  Medication Sig Dispense Refill  . Amlodipine-Valsartan-HCTZ (EXFORGE HCT) 5-160-12.5 MG TABS Take 1 tablet by mouth daily.    [name] anastrozole (ARIMIDEX) 1 MG tablet Take 1 tablet (1 mg total) by mouth daily. 90 tablet 3  . calcium carbonate (OS-CAL) 600 MG TABS Take 600 mg by mouth daily.    . Cholecalciferol (VITAMIN D-3) 1000 UNITS CAPS Take 1,000 Units by mouth daily.    . diclofenac sodium (VOLTAREN) 1 % GEL Apply 2 g topically 3 (three) times daily as needed. For arthritis pain    . fluticasone (FLONASE) 50 MCG/ACT nasal spray Place 2 sprays into both nostrils daily as needed for allergies or rhinitis. 16 g 2  . folic acid (FOLVITE) 817 MCG tablet Take 400-800 mcg by mouth daily.    [name] loratadine (CLARITIN) 10 MG tablet Take 1 tablet (10 mg total) by mouth daily as needed for allergies or rhinitis. 30 tablet 3  . metoCLOPramide (REGLAN) 5 MG tablet Take 1 tablet (5 mg total) by mouth 3 (three) times daily with meals as needed for nausea or vomiting. Please take 15-75mns before the meal 90 tablet 3  . Omega-3 Fatty Acids (FISH OIL) 500 MG CAPS Take 1 capsule by mouth daily.    .Marland Kitchenomeprazole (PRILOSEC) 20 MG capsule Take 20 mg by mouth daily.  10  . pantoprazole (PROTONIX) 40 MG tablet Take 1 tablet (40 mg total) by mouth daily. 30 tablet 3  . vitamin C (ASCORBIC ACID) 500 MG tablet Take 500 mg by mouth daily.     No current facility-administered medications for this visit.    PHYSICAL EXAMINATION: ECOG PERFORMANCE STATUS: 0 - Asymptomatic  Filed Vitals:   05/29/15 0941  BP: 132/42  Pulse: 80  Temp: 98.1 F (36.7 C)  Resp: 18   Filed Weights   05/29/15 0941  Weight: 133 lb 9.6 oz (60.601 kg)    GENERAL:alert, no distress and comfortable SKIN: skin color, texture, turgor are normal, no rashes or significant lesions EYES: normal, Conjunctiva are pink and non-injected, sclera clear OROPHARYNX:no exudate, no  erythema and lips, buccal mucosa, and tongue normal  NECK: supple, thyroid normal size, non-tender, without nodularity LYMPH:  no palpable lymphadenopathy in the cervical, axillary or inguinal LUNGS: clear to auscultation and percussion with normal breathing effort HEART: regular rate & rhythm and no murmurs and no lower extremity edema ABDOMEN:abdomen soft, non-tender and normal bowel sounds Musculoskeletal:no cyanosis of digits and no clubbing  NEURO: alert & oriented x 3 with fluent speech, no focal motor/sensory deficits BREAST: No palpable masses or nodules in either right or left breasts. No palpable axillary supraclavicular or infraclavicular adenopathy no breast tenderness or nipple discharge. (exam performed in the presence of a chaperone)  LABORATORY DATA:  I have reviewed the data as listed   Chemistry      Component Value Date/Time   NA 141 05/29/2015 0929   NA 133* 05/15/2015 1050   NA 138 04/12/2010 0954   K 4.7 05/29/2015 0929   K 5.1 05/15/2015 1050   K 4.8* 04/12/2010 0954   CL 99* 05/15/2015 1050   CL 105 12/28/2012 1059   CL 101 04/12/2010 0954   CO2 25 05/29/2015 0929   CO2 24 05/15/2015 1050   CO2 26 04/12/2010 0954   BUN 22.5 05/29/2015 0929   BUN 12 05/15/2015 1050   BUN 14 04/12/2010 0954   CREATININE 1.7* 05/29/2015 0929   CREATININE 1.41* 05/15/2015 1050   CREATININE 1.2 04/12/2010 0954      Component Value Date/Time   CALCIUM 10.1 05/29/2015 0929   CALCIUM 9.8 05/15/2015 1050   CALCIUM 9.8 04/12/2010 0954   ALKPHOS 115 05/29/2015 0929   ALKPHOS 91 05/15/2015 1050   ALKPHOS 106* 04/12/2010 0954   AST 31 05/29/2015 0929   AST 29 05/15/2015 1050   AST 80* 04/12/2010 0954   ALT 20 05/29/2015 0929   ALT 14 05/15/2015 1050   ALT 69* 04/12/2010 0954   BILITOT 0.38 05/29/2015 0929   BILITOT 0.4 05/15/2015 1050   BILITOT 0.50 04/12/2010 0954       Lab Results  Component Value Date   WBC 6.0 05/29/2015   HGB 11.0* 05/29/2015   HCT 33.0*  05/29/2015   MCV 88.2 05/29/2015   PLT 282 05/29/2015   NEUTROABS 4.0 05/29/2015   ASSESSMENT & PLAN:  Breast cancer, left breast Left breast invasive ductal carcinoma T2 N0 M0 stage II a 2.1 cm grade 1 ER/PR positive HER-2 negative diagnosed November 2013 status post lumpectomy and radiation currently on oral Arimidex started March 2014.  Breast cancer surveillance: 1. Mammograms February 2015 revealed benign-appearing intramammary lymph node in the right side was evaluated by ultrasound and felt to be not concerning 2. Physical examination 10/27/2014: Palpable abnormality in the left breast at the lateral end of the previous scar tissue. This was evaluated by mammogram and ultrasound 11/03/2014 and was found to be benign area  Arimidex toxicities: Patient does not have any major toxicities from Arimidex.  Her bone density test 05/11/2015: T score -1.5 (mild osteopenia), continue with calcium and vitamin D  Return to clinic in 6 months for follow-up    No orders of the defined types were placed  in this encounter.   The patient has a good understanding of the overall plan. she agrees with it. she will call with any problems that may develop before the next visit here.   Rulon Eisenmenger, MD

## 2015-06-05 DIAGNOSIS — I10 Essential (primary) hypertension: Secondary | ICD-10-CM | POA: Diagnosis not present

## 2015-06-05 DIAGNOSIS — B182 Chronic viral hepatitis C: Secondary | ICD-10-CM | POA: Diagnosis not present

## 2015-06-05 DIAGNOSIS — R7309 Other abnormal glucose: Secondary | ICD-10-CM | POA: Diagnosis not present

## 2015-06-05 DIAGNOSIS — D508 Other iron deficiency anemias: Secondary | ICD-10-CM | POA: Diagnosis not present

## 2015-07-07 ENCOUNTER — Telehealth: Payer: Self-pay | Admitting: *Deleted

## 2015-07-07 NOTE — Telephone Encounter (Signed)
PT. RECEIVED THE PROTONIX PRESCRIPTION FROM THE HOSPITALIST WHEN SHE WAS [place]. PT. IS UNDER SURVEILLANCE WITH A YEARLY APPOINTMENT WITH DR.GUDENA. INSTRUCTED PT. TO CALL HER PRIMARY CARE PHYSICIAN, DR.BLAND, CONCERNING THE PROTONIX REFILL. SHE VOICE UNDERSTANDING.

## 2015-09-19 ENCOUNTER — Other Ambulatory Visit: Payer: Self-pay

## 2015-09-19 ENCOUNTER — Ambulatory Visit (HOSPITAL_BASED_OUTPATIENT_CLINIC_OR_DEPARTMENT_OTHER): Payer: Medicare Other | Admitting: Hematology and Oncology

## 2015-09-19 ENCOUNTER — Other Ambulatory Visit (HOSPITAL_BASED_OUTPATIENT_CLINIC_OR_DEPARTMENT_OTHER): Payer: Medicare Other

## 2015-09-19 ENCOUNTER — Encounter: Payer: Self-pay | Admitting: Hematology and Oncology

## 2015-09-19 VITALS — BP 125/47 | HR 67 | Temp 98.2°F | Resp 18 | Ht 62.0 in | Wt 134.0 lb

## 2015-09-19 DIAGNOSIS — C50912 Malignant neoplasm of unspecified site of left female breast: Secondary | ICD-10-CM

## 2015-09-19 DIAGNOSIS — R55 Syncope and collapse: Secondary | ICD-10-CM

## 2015-09-19 DIAGNOSIS — D638 Anemia in other chronic diseases classified elsewhere: Secondary | ICD-10-CM

## 2015-09-19 LAB — CBC WITH DIFFERENTIAL/PLATELET
BASO%: 1 % (ref 0.0–2.0)
Basophils Absolute: 0.1 10*3/uL (ref 0.0–0.1)
EOS ABS: 0.1 10*3/uL (ref 0.0–0.5)
EOS%: 1.3 % (ref 0.0–7.0)
HCT: 36.9 % (ref 34.8–46.6)
HGB: 12 g/dL (ref 11.6–15.9)
LYMPH%: 19.8 % (ref 14.0–49.7)
MCH: 29.9 pg (ref 25.1–34.0)
MCHC: 32.6 g/dL (ref 31.5–36.0)
MCV: 91.8 fL (ref 79.5–101.0)
MONO#: 0.5 10*3/uL (ref 0.1–0.9)
MONO%: 9.3 % (ref 0.0–14.0)
NEUT%: 68.6 % (ref 38.4–76.8)
NEUTROS ABS: 4 10*3/uL (ref 1.5–6.5)
Platelets: 282 10*3/uL (ref 145–400)
RBC: 4.02 10*6/uL (ref 3.70–5.45)
RDW: 14.1 % (ref 11.2–14.5)
WBC: 5.9 10*3/uL (ref 3.9–10.3)
lymph#: 1.2 10*3/uL (ref 0.9–3.3)

## 2015-09-19 LAB — COMPREHENSIVE METABOLIC PANEL (CC13)
ALT: 26 U/L (ref 0–55)
AST: 34 U/L (ref 5–34)
Albumin: 3.6 g/dL (ref 3.5–5.0)
Alkaline Phosphatase: 101 U/L (ref 40–150)
Anion Gap: 7 mEq/L (ref 3–11)
BUN: 18.3 mg/dL (ref 7.0–26.0)
CO2: 26 mEq/L (ref 22–29)
Calcium: 10.2 mg/dL (ref 8.4–10.4)
Chloride: 106 mEq/L (ref 98–109)
Creatinine: 1.4 mg/dL — ABNORMAL HIGH (ref 0.6–1.1)
EGFR: 42 mL/min/{1.73_m2} — ABNORMAL LOW (ref >90)
GLUCOSE: 104 mg/dL (ref 70–140)
POTASSIUM: 4.6 meq/L (ref 3.5–5.1)
SODIUM: 140 meq/L (ref 136–145)
Total Bilirubin: 0.37 mg/dL (ref 0.20–1.20)
Total Protein: 8.2 g/dL (ref 6.4–8.3)

## 2015-09-19 LAB — MAGNESIUM (CC13): Magnesium: 2.1 mg/dl (ref 1.5–2.5)

## 2015-09-19 NOTE — Progress Notes (Signed)
Patient Care Team: Lucianne Lei, MD as PCP - General (Family Medicine)  DIAGNOSIS: No matching staging information was found for the patient.  SUMMARY OF ONCOLOGIC HISTORY:   Breast cancer, left breast ([place])   08/25/2012 Initial Diagnosis Breast cancer, left breast: Invasive ductal carcinoma grade 1-2 ER/PR positive HER-2 negative Ki-67 14%   10/08/2012 Surgery Left lumpectomy and sentinel lymph node biopsy: 2.1 cm invasive ductal carcinoma 3 sentinel lymph nodes negative posterior margin reexcision ER positive PR positive HER-2 negative Ki-67 14% grade 1-2   11/10/2012 - 12/11/2012 Radiation Therapy Adjuvant radiation therapy   12/28/2012 -  Anti-estrogen oral therapy Arimidex 1 mg daily    CHIEF COMPLIANT: syncope at home  INTERVAL HISTORY: [name] is a 81 year old with above-mentioned history of left breast cancer currently on Arimidex therapy and tolerating it fairly well. She has had episodes of syncope periodically at least once a year and it was attributed to vasovagal syncope. She had an episode at home today and she recovered fully from it and wanted to be checked out by [place] today. She does not have any residual symptoms or concerns. She also noted that in the past when she has these episodes she was always anemic. She wanted to get her blood checked as well. She has been feeling mildly fatigued over the past several days. She does not have any stressors at home.  REVIEW OF SYSTEMS:   Constitutional: Denies fevers, chills or abnormal weight loss Eyes: Denies blurriness of vision Ears, nose, mouth, throat, and face: Denies mucositis or sore throat Respiratory: Denies cough, dyspnea or wheezes Cardiovascular: Denies palpitation, chest discomfort or lower extremity swelling Gastrointestinal:  Denies nausea, heartburn or change in bowel habits Skin: Denies abnormal skin rashes Lymphatics: Denies new lymphadenopathy or easy bruising Neurological:episode of  syncope Behavioral/Psych: Mood is stable, no new changes  Breast:  denies any pain or lumps or nodules in either breasts All other systems were reviewed with the patient and are negative.  I have reviewed the past medical history, past surgical history, social history and family history with the patient and they are unchanged from previous note.  ALLERGIES:  has No Known Allergies.  MEDICATIONS:  Current Outpatient Prescriptions  Medication Sig Dispense Refill  . Amlodipine-Valsartan-HCTZ (EXFORGE HCT) 5-160-12.5 MG TABS Take 1 tablet by mouth daily.    [name] anastrozole (ARIMIDEX) 1 MG tablet Take 1 tablet (1 mg total) by mouth daily. 90 tablet 3  . calcium carbonate (OS-CAL) 600 MG TABS Take 600 mg by mouth daily.    . Cholecalciferol (VITAMIN D-3) 1000 UNITS CAPS Take 1,000 Units by mouth daily.    . diclofenac sodium (VOLTAREN) 1 % GEL Apply 2 g topically 3 (three) times daily as needed. For arthritis pain    . fluticasone (FLONASE) 50 MCG/ACT nasal spray Place 2 sprays into both nostrils daily as needed for allergies or rhinitis. 16 g 2  . folic acid (FOLVITE) 681 MCG tablet Take 400-800 mcg by mouth daily.    [name] loratadine (CLARITIN) 10 MG tablet Take 1 tablet (10 mg total) by mouth daily as needed for allergies or rhinitis. 30 tablet 3  . metoCLOPramide (REGLAN) 5 MG tablet Take 1 tablet (5 mg total) by mouth 3 (three) times daily with meals as needed for nausea or vomiting. Please take 15-53mns before the meal 90 tablet 3  . Omega-3 Fatty Acids (FISH OIL) 500 MG CAPS Take 1 capsule by mouth daily.    .Marland Kitchenomeprazole (PRILOSEC) 20 MG  capsule Take 20 mg by mouth daily.  10  . pantoprazole (PROTONIX) 40 MG tablet Take 1 tablet (40 mg total) by mouth daily. 30 tablet 3  . vitamin C (ASCORBIC ACID) 500 MG tablet Take 500 mg by mouth daily.     No current facility-administered medications for this visit.    PHYSICAL EXAMINATION: ECOG PERFORMANCE STATUS: 1 - Symptomatic but completely  ambulatory  Filed Vitals:   09/19/15 1125  BP: 125/47  Pulse: 67  Temp: 98.2 F (36.8 C)  Resp: 18   Filed Weights   09/19/15 1125  Weight: 134 lb (60.782 kg)    GENERAL:alert, no distress and comfortable SKIN: skin color, texture, turgor are normal, no rashes or significant lesions EYES: normal, Conjunctiva are pink and non-injected, sclera clear OROPHARYNX:no exudate, no erythema and lips, buccal mucosa, and tongue normal  NECK: supple, thyroid normal size, non-tender, without nodularity LYMPH:  no palpable lymphadenopathy in the cervical, axillary or inguinal LUNGS: clear to auscultation and percussion with normal breathing effort HEART: regular rate & rhythm and no murmurs and no lower extremity edema ABDOMEN:abdomen soft, non-tender and normal bowel sounds Musculoskeletal:no cyanosis of digits and no clubbing  NEURO: alert & oriented x 3 with fluent speech, no focal motor/sensory deficits   LABORATORY DATA:  I have reviewed the data as listed   Chemistry      Component Value Date/Time   NA 140 09/19/2015 1056   NA 133* 05/15/2015 1050   NA 138 04/12/2010 0954   K 4.6 09/19/2015 1056   K 5.1 05/15/2015 1050   K 4.8* 04/12/2010 0954   CL 99* 05/15/2015 1050   CL 105 12/28/2012 1059   CL 101 04/12/2010 0954   CO2 26 09/19/2015 1056   CO2 24 05/15/2015 1050   CO2 26 04/12/2010 0954   BUN 18.3 09/19/2015 1056   BUN 12 05/15/2015 1050   BUN 14 04/12/2010 0954   CREATININE 1.4* 09/19/2015 1056   CREATININE 1.41* 05/15/2015 1050   CREATININE 1.2 04/12/2010 0954      Component Value Date/Time   CALCIUM 10.2 09/19/2015 1056   CALCIUM 9.8 05/15/2015 1050   CALCIUM 9.8 04/12/2010 0954   ALKPHOS 101 09/19/2015 1056   ALKPHOS 91 05/15/2015 1050   ALKPHOS 106* 04/12/2010 0954   AST 34 09/19/2015 1056   AST 29 05/15/2015 1050   AST 80* 04/12/2010 0954   ALT 26 09/19/2015 1056   ALT 14 05/15/2015 1050   ALT 69* 04/12/2010 0954   BILITOT 0.37 09/19/2015 1056    BILITOT 0.4 05/15/2015 1050   BILITOT 0.50 04/12/2010 0954       Lab Results  Component Value Date   WBC 5.9 09/19/2015   HGB 12.0 09/19/2015   HCT 36.9 09/19/2015   MCV 91.8 09/19/2015   PLT 282 09/19/2015   NEUTROABS 4.0 09/19/2015    ASSESSMENT & PLAN:  Syncope evaluation: Physical examination does not reveal any residual neurological signs or symptoms. EKG performed in our office shows normal sinus rhythm without any abnormalities. Lab work was reviewed which showed normal CBC. There is no evidence of anemia. Her CMP is abnormal for elevated creatinine which has been there in the past as well.  Recommendation: I encouraged her to drink more water and make sure she takes her medications after eating. I also have asked her to discuss with her primary care physician regarding her blood pressure medication whether that may be causing these episodes.  Left breast invasive ductal carcinoma T2 N0  M0 stage II a 2.1 cm grade 1 ER/PR positive HER-2 negative diagnosed November 2013 status post lumpectomy and radiation currently on oral Arimidex started March 2014.  Return to clinic in August 2017 as previously scheduled.  No orders of the defined types were placed in this encounter.   The patient has a good understanding of the overall plan. she agrees with it. she will call with any problems that may develop before the next visit here.   Rulon Eisenmenger, MD 09/19/2015

## 2015-09-19 NOTE — Addendum Note (Signed)
Addended by: Prentiss Bells on: 09/19/2015 04:41 PM   Modules accepted: Medications

## 2015-09-19 NOTE — Progress Notes (Signed)
Pt walk in - walk in form sent to scan.  Per Dr. Lindi Adie - labs - CBC, CMP, Mag.  EKG.  Appt made for lab and MD.  Lab notified.  Lobby notified for patient to register.

## 2015-10-06 NOTE — Progress Notes (Signed)
This encounter was created in error - please disregard.

## 2015-10-11 DIAGNOSIS — I790 Aneurysm of aorta in diseases classified elsewhere: Secondary | ICD-10-CM | POA: Diagnosis not present

## 2015-10-11 DIAGNOSIS — I10 Essential (primary) hypertension: Secondary | ICD-10-CM | POA: Diagnosis not present

## 2015-10-11 DIAGNOSIS — G250 Essential tremor: Secondary | ICD-10-CM | POA: Diagnosis not present

## 2015-10-11 DIAGNOSIS — D508 Other iron deficiency anemias: Secondary | ICD-10-CM | POA: Diagnosis not present

## 2015-10-11 DIAGNOSIS — R7309 Other abnormal glucose: Secondary | ICD-10-CM | POA: Diagnosis not present

## 2015-10-25 ENCOUNTER — Other Ambulatory Visit: Payer: Self-pay | Admitting: *Deleted

## 2015-10-25 DIAGNOSIS — C50212 Malignant neoplasm of upper-inner quadrant of left female breast: Secondary | ICD-10-CM

## 2015-10-25 MED ORDER — ANASTROZOLE 1 MG PO TABS: 1.0000 mg | ORAL_TABLET | Freq: Every day | ORAL | Status: DC

## 2015-10-25 NOTE — Telephone Encounter (Signed)
Elmo Putt with [facility] called asking if patient may have anastrozole refilled.

## 2015-11-09 ENCOUNTER — Other Ambulatory Visit: Payer: Self-pay | Admitting: Hematology and Oncology

## 2015-11-09 ENCOUNTER — Other Ambulatory Visit: Payer: Self-pay | Admitting: Family Medicine

## 2015-11-09 DIAGNOSIS — C50919 Malignant neoplasm of unspecified site of unspecified female breast: Secondary | ICD-10-CM

## 2015-12-18 ENCOUNTER — Ambulatory Visit
Admission: RE | Admit: 2015-12-18 | Discharge: 2015-12-18 | Disposition: A | Discharge status: Home / Self Care | Payer: Medicare Other | Source: Ambulatory Visit | Attending: Hematology and Oncology | Admitting: Hematology and Oncology

## 2015-12-18 DIAGNOSIS — R928 Other abnormal and inconclusive findings on diagnostic imaging of breast: Secondary | ICD-10-CM | POA: Diagnosis not present

## 2015-12-18 DIAGNOSIS — C50919 Malignant neoplasm of unspecified site of unspecified female breast: Secondary | ICD-10-CM

## 2016-01-08 DIAGNOSIS — M5137 Other intervertebral disc degeneration, lumbosacral region: Secondary | ICD-10-CM | POA: Diagnosis not present

## 2016-01-08 DIAGNOSIS — M19241 Secondary osteoarthritis, right hand: Secondary | ICD-10-CM | POA: Diagnosis not present

## 2016-01-08 DIAGNOSIS — R5381 Other malaise: Secondary | ICD-10-CM | POA: Diagnosis not present

## 2016-01-08 DIAGNOSIS — Z79899 Other long term (current) drug therapy: Secondary | ICD-10-CM | POA: Diagnosis not present

## 2016-01-08 DIAGNOSIS — M79671 Pain in right foot: Secondary | ICD-10-CM | POA: Diagnosis not present

## 2016-01-08 DIAGNOSIS — M1A00X0 Idiopathic chronic gout, unspecified site, without tophus (tophi): Secondary | ICD-10-CM | POA: Diagnosis not present

## 2016-01-31 ENCOUNTER — Other Ambulatory Visit: Payer: Self-pay

## 2016-01-31 DIAGNOSIS — R110 Nausea: Secondary | ICD-10-CM

## 2016-01-31 DIAGNOSIS — C50912 Malignant neoplasm of unspecified site of left female breast: Secondary | ICD-10-CM

## 2016-01-31 MED ORDER — METOCLOPRAMIDE HCL 5 MG PO TABS
5.0000 mg | ORAL_TABLET | Freq: Three times a day (TID) | ORAL | Status: DC | PRN
Start: 2016-01-31 — End: 2017-01-23

## 2016-02-08 DIAGNOSIS — R7309 Other abnormal glucose: Secondary | ICD-10-CM | POA: Diagnosis not present

## 2016-02-08 DIAGNOSIS — M1000 Idiopathic gout, unspecified site: Secondary | ICD-10-CM | POA: Diagnosis not present

## 2016-02-08 DIAGNOSIS — I10 Essential (primary) hypertension: Secondary | ICD-10-CM | POA: Diagnosis not present

## 2016-02-09 DIAGNOSIS — Z79899 Other long term (current) drug therapy: Secondary | ICD-10-CM | POA: Diagnosis not present

## 2016-02-09 DIAGNOSIS — R5381 Other malaise: Secondary | ICD-10-CM | POA: Diagnosis not present

## 2016-02-09 DIAGNOSIS — M2550 Pain in unspecified joint: Secondary | ICD-10-CM | POA: Diagnosis not present

## 2016-03-05 DIAGNOSIS — I10 Essential (primary) hypertension: Secondary | ICD-10-CM | POA: Diagnosis not present

## 2016-03-05 DIAGNOSIS — C50812 Malignant neoplasm of overlapping sites of left female breast: Secondary | ICD-10-CM | POA: Diagnosis not present

## 2016-03-12 ENCOUNTER — Other Ambulatory Visit (HOSPITAL_COMMUNITY): Payer: Self-pay | Admitting: Nurse Practitioner

## 2016-03-12 DIAGNOSIS — B182 Chronic viral hepatitis C: Secondary | ICD-10-CM | POA: Diagnosis not present

## 2016-03-15 DIAGNOSIS — Z79899 Other long term (current) drug therapy: Secondary | ICD-10-CM | POA: Diagnosis not present

## 2016-04-11 ENCOUNTER — Ambulatory Visit (HOSPITAL_COMMUNITY)
Admission: RE | Admit: 2016-04-11 | Discharge: 2016-04-11 | Disposition: A | Discharge status: Home / Self Care | Payer: Medicare Other | Source: Ambulatory Visit | Attending: Nurse Practitioner | Admitting: Nurse Practitioner

## 2016-04-11 DIAGNOSIS — B182 Chronic viral hepatitis C: Secondary | ICD-10-CM | POA: Insufficient documentation

## 2016-04-11 DIAGNOSIS — I700 Atherosclerosis of aorta: Secondary | ICD-10-CM | POA: Insufficient documentation

## 2016-04-11 DIAGNOSIS — B1920 Unspecified viral hepatitis C without hepatic coma: Secondary | ICD-10-CM | POA: Diagnosis not present

## 2016-04-19 DIAGNOSIS — M79641 Pain in right hand: Secondary | ICD-10-CM | POA: Diagnosis not present

## 2016-04-19 DIAGNOSIS — R748 Abnormal levels of other serum enzymes: Secondary | ICD-10-CM | POA: Diagnosis not present

## 2016-04-19 DIAGNOSIS — M1A00X0 Idiopathic chronic gout, unspecified site, without tophus (tophi): Secondary | ICD-10-CM | POA: Diagnosis not present

## 2016-04-20 ENCOUNTER — Emergency Department (HOSPITAL_BASED_OUTPATIENT_CLINIC_OR_DEPARTMENT_OTHER)
Admit: 2016-04-20 | Discharge: 2016-04-20 | Disposition: A | Discharge status: Home / Self Care | Payer: Medicare Other | Attending: Emergency Medicine | Admitting: Emergency Medicine

## 2016-04-20 ENCOUNTER — Emergency Department (HOSPITAL_COMMUNITY)
Admission: EM | Admit: 2016-04-20 | Discharge: 2016-04-20 | Disposition: A | Discharge status: Home / Self Care | Payer: Medicare Other | Attending: Emergency Medicine | Admitting: Emergency Medicine

## 2016-04-20 ENCOUNTER — Encounter (HOSPITAL_COMMUNITY): Payer: Self-pay | Admitting: *Deleted

## 2016-04-20 DIAGNOSIS — Z79899 Other long term (current) drug therapy: Secondary | ICD-10-CM | POA: Diagnosis not present

## 2016-04-20 DIAGNOSIS — R531 Weakness: Secondary | ICD-10-CM | POA: Diagnosis not present

## 2016-04-20 DIAGNOSIS — R109 Unspecified abdominal pain: Secondary | ICD-10-CM

## 2016-04-20 DIAGNOSIS — M7989 Other specified soft tissue disorders: Secondary | ICD-10-CM | POA: Diagnosis not present

## 2016-04-20 DIAGNOSIS — E860 Dehydration: Secondary | ICD-10-CM | POA: Insufficient documentation

## 2016-04-20 DIAGNOSIS — I10 Essential (primary) hypertension: Secondary | ICD-10-CM | POA: Diagnosis not present

## 2016-04-20 DIAGNOSIS — Z87891 Personal history of nicotine dependence: Secondary | ICD-10-CM | POA: Insufficient documentation

## 2016-04-20 DIAGNOSIS — N189 Chronic kidney disease, unspecified: Secondary | ICD-10-CM

## 2016-04-20 DIAGNOSIS — C50912 Malignant neoplasm of unspecified site of left female breast: Secondary | ICD-10-CM | POA: Diagnosis not present

## 2016-04-20 DIAGNOSIS — N179 Acute kidney failure, unspecified: Secondary | ICD-10-CM | POA: Diagnosis not present

## 2016-04-20 DIAGNOSIS — I129 Hypertensive chronic kidney disease with stage 1 through stage 4 chronic kidney disease, or unspecified chronic kidney disease: Secondary | ICD-10-CM | POA: Diagnosis not present

## 2016-04-20 HISTORY — DX: Unspecified abdominal pain: R10.9

## 2016-04-20 LAB — CBC
HEMATOCRIT: 35.1 % — AB (ref 36.0–46.0)
HEMOGLOBIN: 11.3 g/dL — AB (ref 12.0–15.0)
MCH: 30.2 pg (ref 26.0–34.0)
MCHC: 32.2 g/dL (ref 30.0–36.0)
MCV: 93.9 fL (ref 78.0–100.0)
Platelets: 235 10*3/uL (ref 150–400)
RBC: 3.74 MIL/uL — AB (ref 3.87–5.11)
RDW: 14.2 % (ref 11.5–15.5)
WBC: 5 10*3/uL (ref 4.0–10.5)

## 2016-04-20 LAB — BASIC METABOLIC PANEL
Anion gap: 8 (ref 5–15)
BUN: 25 mg/dL — ABNORMAL HIGH (ref 6–20)
CHLORIDE: 102 mmol/L (ref 101–111)
CO2: 26 mmol/L (ref 22–32)
Calcium: 9.8 mg/dL (ref 8.9–10.3)
Creatinine, Ser: 1.64 mg/dL — ABNORMAL HIGH (ref 0.44–1.00)
GFR calc Af Amer: 33 mL/min — ABNORMAL LOW (ref >60)
GFR calc non Af Amer: 28 mL/min — ABNORMAL LOW (ref >60)
GLUCOSE: 86 mg/dL (ref 65–99)
Potassium: 4.8 mmol/L (ref 3.5–5.1)
Sodium: 136 mmol/L (ref 135–145)

## 2016-04-20 LAB — HEPATIC FUNCTION PANEL
ALBUMIN: 3.4 g/dL — AB (ref 3.5–5.0)
ALT: 42 U/L (ref 14–54)
AST: 55 U/L — ABNORMAL HIGH (ref 15–41)
Alkaline Phosphatase: 94 U/L (ref 38–126)
BILIRUBIN TOTAL: 0.3 mg/dL (ref 0.3–1.2)
Bilirubin, Direct: <0.1 mg/dL — ABNORMAL LOW (ref 0.1–0.5)
Total Protein: 7.1 g/dL (ref 6.5–8.1)

## 2016-04-20 LAB — URINALYSIS, ROUTINE W REFLEX MICROSCOPIC
BILIRUBIN URINE: NEGATIVE
Glucose, UA: NEGATIVE mg/dL
HGB URINE DIPSTICK: NEGATIVE
Ketones, ur: NEGATIVE mg/dL
Leukocytes, UA: NEGATIVE
Nitrite: NEGATIVE
PH: 7 (ref 5.0–8.0)
Protein, ur: NEGATIVE mg/dL
Specific Gravity, Urine: 1.01 (ref 1.005–1.030)

## 2016-04-20 LAB — D-DIMER, QUANTITATIVE: D-Dimer, Quant: 0.51 ug/mL-FEU — ABNORMAL HIGH (ref 0.00–0.50)

## 2016-04-20 LAB — I-STAT TROPONIN, ED: TROPONIN I, POC: 0.01 ng/mL (ref 0.00–0.08)

## 2016-04-20 MED ORDER — SODIUM CHLORIDE 0.9 % IV BOLUS (SEPSIS)
1000.0000 mL | Freq: Once | INTRAVENOUS | Status: AC
  Administered 2016-04-20: 1000 mL via INTRAVENOUS

## 2016-04-20 NOTE — ED Notes (Signed)
Patient transported to Ultrasound 

## 2016-04-20 NOTE — ED Notes (Signed)
Pt reports generalized fatigue and weakness for several days and headache. Has palpitations when lying down. Hx of anemia.

## 2016-04-20 NOTE — ED Provider Notes (Signed)
CSN: [id number]     Arrival date & time 04/20/16  1303 History   First MD Initiated Contact with Patient 04/20/16 1541     Chief Complaint  Patient presents with  . Weakness    Patient is a 82 y.o. female presenting with weakness. The history is provided by the patient and a relative.  Weakness This is a recurrent problem. The current episode started today. The problem occurs constantly. The problem has been gradually worsening. Associated symptoms include fatigue and weakness. Pertinent negatives include no abdominal pain, chest pain, chills, congestion, coughing, fever, headaches, nausea, neck pain, rash, sore throat or vomiting. The symptoms are aggravated by walking. She has tried nothing for the symptoms.    Past Medical History  Diagnosis Date  . Anemia of chronic disease   . Anxiety   . Hypertension   . Hepatitis   . Lipid disorder   . Wears glasses   . Renal insufficiency   . Wears dentures     top  . Breast cancer ([place]) 08/28/12    IDC left breast bx=invasive ductal ca,ER/PR=+  . Breast cancer ([facility]) 09/01/2012  . Radiation 11/12/12-12/15/11    Left Breast/50 Gy  . RA (rheumatoid arthritis) ([facility])    Past Surgical History  Procedure Laterality Date  . Abdominal hysterectomy       approx 20 years ago  . Appendectomy      20 years ago  . Foot surgery  1992    bone spurs both feet  . Colonoscopy  2009?  [name] Partial mastectomy with needle localization and axillary sentinel lymph node bx  10/08/2012    Procedure: PARTIAL MASTECTOMY WITH NEEDLE LOCALIZATION AND AXILLARY SENTINEL LYMPH NODE BX;  Surgeon: Adin Hector, MD;  Location: [facility];  Service: General;  Laterality: Left;  Left partial mastectomy with Needle localization and Sentinel lymph node biospy  . Enteroscopy N/A 11/08/2013    Procedure: ENTEROSCOPY;  Surgeon: Beryle Beams, MD;  Location: [facility];  Service: Endoscopy;  Laterality: N/A;  . Bones spur      removed 20 years ago  .  Enteroscopy N/A 04/18/2014    Procedure: ENTEROSCOPY;  Surgeon: Beryle Beams, MD;  Location: [facility];  Service: Endoscopy;  Laterality: N/A;   Family History  Problem Relation Age of Onset  . Cancer Father   . Prostate cancer Father   . Breast cancer Cousin   . Tuberculosis Mother    Social History  Substance Use Topics  . Smoking status: Former Smoker -- 1.00 packs/day    Types: Cigarettes    Quit date: 10/27/2007  . Smokeless tobacco: Never Used     Comment: started smoking age 87  . Alcohol Use: 0.6 oz/week    1 Glasses of wine per week     Comment:  glass wine per week   OB History    No data available     Review of Systems  Constitutional: Positive for fatigue. Negative for fever and chills.  HENT: Negative for congestion and sore throat.   Eyes: Negative for pain.  Respiratory: Negative for cough and shortness of breath.   Cardiovascular: Negative for chest pain and palpitations.  Gastrointestinal: Negative for nausea, vomiting, abdominal pain and diarrhea.  Genitourinary: Negative for dysuria and flank pain.  Musculoskeletal: Negative for back pain and neck pain.  Skin: Negative for rash.  Allergic/Immunologic: Negative.   Neurological: Positive for dizziness and weakness. Negative for light-headedness and headaches.  Psychiatric/Behavioral: Negative for  confusion.      Allergies  Review of patient's allergies indicates no known allergies.  Home Medications   Prior to Admission medications   Medication Sig Start Date End Date Taking? Authorizing Provider  Amlodipine-Valsartan-HCTZ (EXFORGE HCT) 5-160-12.5 MG TABS Take 1 tablet by mouth daily.    Historical Provider, MD  anastrozole (ARIMIDEX) 1 MG tablet Take 1 tablet (1 mg total) by mouth daily. 10/25/15   Nicholas Lose, MD  calcium carbonate (OS-CAL) 600 MG TABS Take 600 mg by mouth daily.    Historical Provider, MD  Cholecalciferol (VITAMIN D-3) 1000 UNITS CAPS Take 1,000 Units by mouth daily.     Historical Provider, MD  diclofenac sodium (VOLTAREN) 1 % GEL Apply 2 g topically 3 (three) times daily as needed. For arthritis pain    Historical Provider, MD  fluticasone (FLONASE) 50 MCG/ACT nasal spray Place 2 sprays into both nostrils daily as needed for allergies or rhinitis. 11/08/13   Ripudeep Krystal Eaton, MD  folic acid (FOLVITE) A999333 MCG tablet Take 400-800 mcg by mouth daily.    Historical Provider, MD  loratadine (CLARITIN) 10 MG tablet Take 1 tablet (10 mg total) by mouth daily as needed for allergies or rhinitis. 11/08/13   Ripudeep Krystal Eaton, MD  metoCLOPramide (REGLAN) 5 MG tablet Take 1 tablet (5 mg total) by mouth 3 (three) times daily with meals as needed for nausea or vomiting. Please take 15-35mins before the meal 01/31/16   Nicholas Lose, MD  Omega-3 Fatty Acids (FISH OIL) 500 MG CAPS Take 1 capsule by mouth daily.    Historical Provider, MD  omeprazole (PRILOSEC) 20 MG capsule Take 20 mg by mouth daily. 05/09/15   Historical Provider, MD  pantoprazole (PROTONIX) 40 MG tablet Take 1 tablet (40 mg total) by mouth daily. 03/09/15   Ripudeep Krystal Eaton, MD  vitamin C (ASCORBIC ACID) 500 MG tablet Take 500 mg by mouth daily.    Historical Provider, MD   BP 132/61 mmHg  Pulse 81  Temp(Src) 98.1 F (36.7 C) (Oral)  Resp 27  SpO2 99% Physical Exam  Constitutional: She is oriented to person, place, and time. She appears well-developed and well-nourished. No distress.  HENT:  Head: Normocephalic and atraumatic.  Eyes: Conjunctivae and EOM are normal. Pupils are equal, round, and reactive to light.  Neck: Normal range of motion. Neck supple.  Cardiovascular: Normal rate, regular rhythm and normal heart sounds.   Pulmonary/Chest: Effort normal and breath sounds normal. No respiratory distress.  Abdominal: Soft. Bowel sounds are normal. There is no tenderness.  Musculoskeletal: Normal range of motion.  Neurological: She is alert and oriented to person, place, and time. She has normal strength and  normal reflexes. No cranial nerve deficit or sensory deficit. She displays a negative Romberg sign.  Normal finger to nose bilaterally.  No pronator drift bilaterally.    Skin: Skin is warm and dry. She is not diaphoretic.  Psychiatric: She has a normal mood and affect.    ED Course  Procedures (including critical care time) Labs Review Labs Reviewed  BASIC METABOLIC PANEL - Abnormal; Notable for the following:    BUN 25 (*)    Creatinine, Ser 1.64 (*)    GFR calc non Af Amer 28 (*)    GFR calc Af Amer 33 (*)    All other components within normal limits  CBC - Abnormal; Notable for the following:    RBC 3.74 (*)    Hemoglobin 11.3 (*)    HCT 35.1 (*)  All other components within normal limits  D-DIMER, QUANTITATIVE (NOT AT [facility]) - Abnormal; Notable for the following:    D-Dimer, Quant 0.51 (*)    All other components within normal limits  HEPATIC FUNCTION PANEL - Abnormal; Notable for the following:    Albumin 3.4 (*)    AST 55 (*)    Bilirubin, Direct <0.1 (*)    All other components within normal limits  URINE CULTURE  URINALYSIS, ROUTINE W REFLEX MICROSCOPIC (NOT AT [facility])  I-STAT TROPOININ, ED    Imaging Review No results found. I have personally reviewed and evaluated these images and lab results as part of my medical decision-making.   EKG Interpretation   Date/Time:  Saturday April 20 2016 13:17:58 EDT Ventricular Rate:  69 PR Interval:  172 QRS Duration: 78 QT Interval:  404 QTC Calculation: 432 R Axis:   18 Text Interpretation:  Normal sinus rhythm Normal ECG No acute changes No  significant change since last tracing Confirmed by Kathrynn Humble, MD, Thelma Comp  [phone]) on 04/20/2016 5:17:19 PM      MDM    The pt is a 82 yo female presenting for generalized weakness and feeling of dizziness earlier in the day.  Denies any syncope, CP, SOB, or palpitations or fevers. Reports does not like drinking water and has decreased PO intake over last several days.   DDx  dehydration, PE, anemia, ACS, infection, stroke.   On exam pt is HDS in NAD.  Normal neuro exam and doubt stroke.  Do not feel CT head warranted.  Negative DVT study and age adjusted dimer negative.  EKG with NSR, normal rate, with no significant changes from prior.  No CP and screening trop negative.  Do not feel delta warranted.  Only mild anemia on CBC with no leukocytosis.  UA without signs of infection.  Cr mildly elevated from previous and given fluid bolus with sx relief.  Educated to continue aggressive oral hydration at home and to return for any worsening of sx.  Care overseen by my attending Dr. Kathrynn Humble.   Final diagnoses:  Acute-on-chronic kidney injury ([place])  Weakness  Dehydration       Geronimo Boot, MD 04/21/16 1221  Varney Biles, MD 04/21/16 1558

## 2016-04-20 NOTE — Progress Notes (Signed)
VASCULAR LAB PRELIMINARY  PRELIMINARY  PRELIMINARY  PRELIMINARY  Left lower extremity venous duplex completed.    Preliminary report:  There is no DVT or SVT noted in the left lower extremity.   Delaney Schnick, RVT 04/20/2016, 6:31 PM

## 2016-04-20 NOTE — ED Notes (Signed)
MD at bedside. 

## 2016-04-20 NOTE — Discharge Instructions (Signed)
Dehydration, Adult Dehydration is a condition in which you do not have enough fluid or water in your body. It happens when you take in less fluid than you lose. Vital organs such as the kidneys, brain, and heart cannot function without a proper amount of fluids. Any loss of fluids from the body can cause dehydration.  Dehydration can range from mild to severe. This condition should be treated right away to help prevent it from becoming severe. CAUSES  This condition may be caused by:  Vomiting.  Diarrhea.  Excessive sweating, such as when exercising in hot or humid weather.  Not drinking enough fluid during strenuous exercise or during an illness.  Excessive urine output.  Fever.  Certain medicines. RISK FACTORS This condition is more likely to develop in:  People who are taking certain medicines that cause the body to lose excess fluid (diuretics).   People who have a chronic illness, such as diabetes, that may increase urination.  Older adults.   People who live at high altitudes.   People who participate in endurance sports.  SYMPTOMS  Mild Dehydration  Thirst.  Dry lips.  Slightly dry mouth.  Dry, warm skin. Moderate Dehydration  Very dry mouth.   Muscle cramps.   Dark urine and decreased urine production.   Decreased tear production.   Headache.   Light-headedness, especially when you stand up from a sitting position.  Severe Dehydration  Changes in skin.   Cold and clammy skin.   Skin does not spring back quickly when lightly pinched and released.   Changes in body fluids.   Extreme thirst.   No tears.   Not able to sweat when body temperature is high, such as in hot weather.   Minimal urine production.   Changes in vital signs.   Rapid, weak pulse (more than 100 beats per minute when you are sitting still).   Rapid breathing.   Low blood pressure.   Other changes.   Sunken eyes.   Cold hands and feet.    Confusion.  Lethargy and difficulty being awakened.  Fainting (syncope).   Short-term weight loss.   Unconsciousness. DIAGNOSIS  This condition may be diagnosed based on your symptoms. You may also have tests to determine how severe your dehydration is. These tests may include:   Urine tests.   Blood tests.  TREATMENT  Treatment for this condition depends on the severity. Mild or moderate dehydration can often be treated at home. Treatment should be started right away. Do not wait until dehydration becomes severe. Severe dehydration needs to be treated at the hospital. Treatment for Mild Dehydration  Drinking plenty of water to replace the fluid you have lost.   Replacing minerals in your blood (electrolytes) that you may have lost.  Treatment for Moderate Dehydration  Consuming oral rehydration solution (ORS). Treatment for Severe Dehydration  Receiving fluid through an IV tube.   Receiving electrolyte solution through a feeding tube that is passed through your nose and into your stomach (nasogastric tube or NG tube).  Correcting any abnormalities in electrolytes. HOME CARE INSTRUCTIONS   Drink enough fluid to keep your urine clear or pale yellow.   Drink water or fluid slowly by taking small sips. You can also try sucking on ice cubes.  Have food or beverages that contain electrolytes. Examples include bananas and sports drinks.  Take over-the-counter and prescription medicines only as told by your health care provider.   Prepare ORS according to the manufacturer's instructions. Take sips  of ORS every 5 minutes until your urine returns to normal.  If you have vomiting or diarrhea, continue to try to drink water, ORS, or both.   If you have diarrhea, avoid:   Beverages that contain caffeine.   Fruit juice.   Milk.   Carbonated soft drinks.  Do not take salt tablets. This can lead to the condition of having too much sodium in your body  (hypernatremia).  SEEK MEDICAL CARE IF:  You cannot eat or drink without vomiting.  You have had moderate diarrhea during a period of more than 24 hours.  You have a fever. SEEK IMMEDIATE MEDICAL CARE IF:   You have extreme thirst.  You have severe diarrhea.  You have not urinated in 6-8 hours, or you have urinated only a small amount of very dark urine.  You have shriveled skin.  You are dizzy, confused, or both.   This information is not intended to replace advice given to you by your health care provider. Make sure you discuss any questions you have with your health care provider.   Document Released: 10/07/2005 Document Revised: 06/28/2015 Document Reviewed: 02/22/2015 Elsevier Interactive Patient Education 2016 [employer].  Fatigue Fatigue is feeling tired all of the time, a lack of energy, or a lack of motivation. Occasional or mild fatigue is often a normal response to activity or life in general. However, long-lasting (chronic) or extreme fatigue may indicate an underlying medical condition. HOME CARE INSTRUCTIONS  Watch your fatigue for any changes. The following actions may help to lessen any discomfort you are feeling:  Talk to your health care provider about how much sleep you need each night. Try to get the required amount every night.  Take medicines only as directed by your health care provider.  Eat a healthy and nutritious diet. Ask your health care provider if you need help changing your diet.  Drink enough fluid to keep your urine clear or pale yellow.  Practice ways of relaxing, such as yoga, meditation, massage therapy, or acupuncture.  Exercise regularly.   Change situations that cause you stress. Try to keep your work and personal routine reasonable.  Do not abuse illegal drugs.  Limit alcohol intake to no more than 1 drink per day for nonpregnant women and 2 drinks per day for men. One drink equals 12 ounces of beer, 5 ounces of wine, or 1  ounces of hard liquor.  Take a multivitamin, if directed by your health care provider. SEEK MEDICAL CARE IF:   Your fatigue does not get better.  You have a fever.   You have unintentional weight loss or gain.  You have headaches.   You have difficulty:   Falling asleep.  Sleeping throughout the night.  You feel angry, guilty, anxious, or sad.   You are unable to have a bowel movement (constipation).   You skin is dry.   Your legs or another part of your body is swollen.  SEEK IMMEDIATE MEDICAL CARE IF:   You feel confused.   Your vision is blurry.  You feel faint or pass out.   You have a severe headache.   You have severe abdominal, pelvic, or back pain.   You have chest pain, shortness of breath, or an irregular or fast heartbeat.   You are unable to urinate or you urinate less than normal.   You develop abnormal bleeding, such as bleeding from the rectum, vagina, nose, lungs, or nipples.  You vomit blood.   You  have thoughts about harming yourself or committing suicide.   You are worried that you might harm someone else.    This information is not intended to replace advice given to you by your health care provider. Make sure you discuss any questions you have with your health care provider.   Document Released: 08/04/2007 Document Revised: 10/28/2014 Document Reviewed: 02/08/2014 Elsevier Interactive Patient Education 2016 [employer]  Weakness Weakness is a lack of strength. It may be felt all over the body (generalized) or in one specific part of the body (focal). Some causes of weakness can be serious. You may need further medical evaluation, especially if you are elderly or you have a history of immunosuppression (such as chemotherapy or HIV), kidney disease, heart disease, or diabetes. CAUSES  Weakness can be caused by many different things, including:  Infection.  Physical exhaustion.  Internal bleeding or other blood  loss that results in a lack of red blood cells (anemia).  Dehydration. This cause is more common in elderly people.  Side effects or electrolyte abnormalities from medicines, such as pain medicines or sedatives.  Emotional distress, anxiety, or depression.  Circulation problems, especially severe peripheral arterial disease.  Heart disease, such as rapid atrial fibrillation, bradycardia, or heart failure.  Nervous system disorders, such as Guillain-Barr syndrome, multiple sclerosis, or stroke. DIAGNOSIS  To find the cause of your weakness, your caregiver will take your history and perform a physical exam. Lab tests or X-rays may also be ordered, if needed. TREATMENT  Treatment of weakness depends on the cause of your symptoms and can vary greatly. HOME CARE INSTRUCTIONS   Rest as needed.  Eat a well-balanced diet.  Try to get some exercise every day.  Only take over-the-counter or prescription medicines as directed by your caregiver. SEEK MEDICAL CARE IF:   Your weakness seems to be getting worse or spreads to other parts of your body.  You develop new aches or pains. SEEK IMMEDIATE MEDICAL CARE IF:   You cannot perform your normal daily activities, such as getting dressed and feeding yourself.  You cannot walk up and down stairs, or you feel exhausted when you do so.  You have shortness of breath or chest pain.  You have difficulty moving parts of your body.  You have weakness in only one area of the body or on only one side of the body.  You have a fever.  You have trouble speaking or swallowing.  You cannot control your bladder or bowel movements.  You have black or bloody vomit or stools. MAKE SURE YOU:  Understand these instructions.  Will watch your condition.  Will get help right away if you are not doing well or get worse.   This information is not intended to replace advice given to you by your health care provider. Make sure you discuss any  questions you have with your health care provider.   Document Released: 10/07/2005 Document Revised: 04/07/2012 Document Reviewed: 12/06/2011 Elsevier Interactive Patient Education [employer].

## 2016-04-20 NOTE — ED Notes (Signed)
Spoke to Vascular Tech, she is aware of patient

## 2016-04-21 LAB — URINE CULTURE

## 2016-04-22 ENCOUNTER — Encounter (HOSPITAL_COMMUNITY): Payer: Self-pay

## 2016-04-22 ENCOUNTER — Emergency Department (HOSPITAL_COMMUNITY): Payer: Medicare Other

## 2016-04-22 ENCOUNTER — Observation Stay (HOSPITAL_COMMUNITY)
Admission: EM | Admit: 2016-04-22 | Discharge: 2016-04-24 | Disposition: A | Discharge status: Home / Self Care | Payer: Medicare Other | Attending: Internal Medicine | Admitting: Internal Medicine

## 2016-04-22 DIAGNOSIS — R1010 Upper abdominal pain, unspecified: Secondary | ICD-10-CM | POA: Insufficient documentation

## 2016-04-22 DIAGNOSIS — R748 Abnormal levels of other serum enzymes: Secondary | ICD-10-CM

## 2016-04-22 DIAGNOSIS — R1011 Right upper quadrant pain: Secondary | ICD-10-CM | POA: Diagnosis not present

## 2016-04-22 DIAGNOSIS — M069 Rheumatoid arthritis, unspecified: Secondary | ICD-10-CM | POA: Diagnosis not present

## 2016-04-22 DIAGNOSIS — D638 Anemia in other chronic diseases classified elsewhere: Secondary | ICD-10-CM | POA: Diagnosis not present

## 2016-04-22 DIAGNOSIS — Z87891 Personal history of nicotine dependence: Secondary | ICD-10-CM | POA: Insufficient documentation

## 2016-04-22 DIAGNOSIS — Z853 Personal history of malignant neoplasm of breast: Secondary | ICD-10-CM | POA: Diagnosis not present

## 2016-04-22 DIAGNOSIS — C50912 Malignant neoplasm of unspecified site of left female breast: Secondary | ICD-10-CM | POA: Diagnosis present

## 2016-04-22 DIAGNOSIS — E785 Hyperlipidemia, unspecified: Secondary | ICD-10-CM | POA: Diagnosis not present

## 2016-04-22 DIAGNOSIS — E871 Hypo-osmolality and hyponatremia: Secondary | ICD-10-CM | POA: Insufficient documentation

## 2016-04-22 DIAGNOSIS — M6281 Muscle weakness (generalized): Secondary | ICD-10-CM | POA: Diagnosis not present

## 2016-04-22 DIAGNOSIS — E860 Dehydration: Secondary | ICD-10-CM | POA: Diagnosis not present

## 2016-04-22 DIAGNOSIS — N183 Chronic kidney disease, stage 3 unspecified: Secondary | ICD-10-CM | POA: Diagnosis present

## 2016-04-22 DIAGNOSIS — R112 Nausea with vomiting, unspecified: Secondary | ICD-10-CM | POA: Diagnosis not present

## 2016-04-22 DIAGNOSIS — K859 Acute pancreatitis without necrosis or infection, unspecified: Secondary | ICD-10-CM | POA: Diagnosis not present

## 2016-04-22 DIAGNOSIS — I129 Hypertensive chronic kidney disease with stage 1 through stage 4 chronic kidney disease, or unspecified chronic kidney disease: Secondary | ICD-10-CM | POA: Insufficient documentation

## 2016-04-22 DIAGNOSIS — R109 Unspecified abdominal pain: Secondary | ICD-10-CM | POA: Diagnosis not present

## 2016-04-22 DIAGNOSIS — R531 Weakness: Secondary | ICD-10-CM | POA: Insufficient documentation

## 2016-04-22 DIAGNOSIS — D631 Anemia in chronic kidney disease: Secondary | ICD-10-CM | POA: Diagnosis not present

## 2016-04-22 DIAGNOSIS — R1013 Epigastric pain: Secondary | ICD-10-CM | POA: Diagnosis not present

## 2016-04-22 DIAGNOSIS — I10 Essential (primary) hypertension: Secondary | ICD-10-CM | POA: Diagnosis present

## 2016-04-22 DIAGNOSIS — C50919 Malignant neoplasm of unspecified site of unspecified female breast: Secondary | ICD-10-CM | POA: Diagnosis present

## 2016-04-22 DIAGNOSIS — R1030 Lower abdominal pain, unspecified: Secondary | ICD-10-CM | POA: Diagnosis not present

## 2016-04-22 HISTORY — DX: Unspecified abdominal pain: R10.9

## 2016-04-22 LAB — COMPREHENSIVE METABOLIC PANEL
ALK PHOS: 91 U/L (ref 38–126)
ALT: 44 U/L (ref 14–54)
AST: 52 U/L — AB (ref 15–41)
Albumin: 3.4 g/dL — ABNORMAL LOW (ref 3.5–5.0)
Anion gap: 7 (ref 5–15)
BILIRUBIN TOTAL: 0.5 mg/dL (ref 0.3–1.2)
BUN: 14 mg/dL (ref 6–20)
CO2: 24 mmol/L (ref 22–32)
CREATININE: 1.24 mg/dL — AB (ref 0.44–1.00)
Calcium: 9.8 mg/dL (ref 8.9–10.3)
Chloride: 100 mmol/L — ABNORMAL LOW (ref 101–111)
GFR calc Af Amer: 46 mL/min — ABNORMAL LOW (ref >60)
GFR, EST NON AFRICAN AMERICAN: 39 mL/min — AB (ref >60)
Glucose, Bld: 119 mg/dL — ABNORMAL HIGH (ref 65–99)
Potassium: 4.8 mmol/L (ref 3.5–5.1)
Sodium: 131 mmol/L — ABNORMAL LOW (ref 135–145)
TOTAL PROTEIN: 7.3 g/dL (ref 6.5–8.1)

## 2016-04-22 LAB — LIPASE, BLOOD: Lipase: 98 U/L — ABNORMAL HIGH (ref 11–51)

## 2016-04-22 LAB — URINALYSIS, ROUTINE W REFLEX MICROSCOPIC
BILIRUBIN URINE: NEGATIVE
GLUCOSE, UA: NEGATIVE mg/dL
Hgb urine dipstick: NEGATIVE
KETONES UR: NEGATIVE mg/dL
NITRITE: NEGATIVE
PROTEIN: NEGATIVE mg/dL
Specific Gravity, Urine: 1.014 (ref 1.005–1.030)
pH: 7 (ref 5.0–8.0)

## 2016-04-22 LAB — CBC
HCT: 34.3 % — ABNORMAL LOW (ref 36.0–46.0)
Hemoglobin: 11.3 g/dL — ABNORMAL LOW (ref 12.0–15.0)
MCH: 30 pg (ref 26.0–34.0)
MCHC: 32.9 g/dL (ref 30.0–36.0)
MCV: 91 fL (ref 78.0–100.0)
PLATELETS: 227 10*3/uL (ref 150–400)
RBC: 3.77 MIL/uL — ABNORMAL LOW (ref 3.87–5.11)
RDW: 13.8 % (ref 11.5–15.5)
WBC: 6.5 10*3/uL (ref 4.0–10.5)

## 2016-04-22 LAB — I-STAT TROPONIN, ED: Troponin i, poc: 0 ng/mL (ref 0.00–0.08)

## 2016-04-22 LAB — URINE MICROSCOPIC-ADD ON: WBC, UA: NONE SEEN WBC/hpf (ref 0–5)

## 2016-04-22 LAB — I-STAT CG4 LACTIC ACID, ED: Lactic Acid, Venous: 0.92 mmol/L (ref 0.5–1.9)

## 2016-04-22 MED ORDER — SODIUM CHLORIDE 0.9 % IV SOLN
INTRAVENOUS | Status: DC
  Administered 2016-04-22 – 2016-04-23 (×2): via INTRAVENOUS

## 2016-04-22 MED ORDER — FLUTICASONE PROPIONATE 50 MCG/ACT NA SUSP
2.0000 | Freq: Every day | NASAL | Status: DC | PRN
  Filled 2016-04-22: qty 16

## 2016-04-22 MED ORDER — VITAMIN D3 25 MCG (1000 UNIT) PO TABS
1000.0000 [IU] | ORAL_TABLET | Freq: Every day | ORAL | Status: DC
  Administered 2016-04-23 – 2016-04-24 (×2): 1000 [IU] via ORAL
  Filled 2016-04-22 (×4): qty 1

## 2016-04-22 MED ORDER — LORATADINE 10 MG PO TABS: 10.0000 mg | ORAL_TABLET | Freq: Every day | ORAL | Status: DC | PRN

## 2016-04-22 MED ORDER — ANASTROZOLE 1 MG PO TABS
1.0000 mg | ORAL_TABLET | Freq: Every day | ORAL | Status: DC
  Administered 2016-04-23 – 2016-04-24 (×2): 1 mg via ORAL
  Filled 2016-04-22 (×2): qty 1

## 2016-04-22 MED ORDER — MORPHINE SULFATE (PF) 2 MG/ML IV SOLN
1.0000 mg | INTRAVENOUS | Status: DC | PRN
  Administered 2016-04-23: 2 mg via INTRAVENOUS
  Filled 2016-04-22: qty 1

## 2016-04-22 MED ORDER — METOCLOPRAMIDE HCL 5 MG PO TABS: 5.0000 mg | ORAL_TABLET | Freq: Three times a day (TID) | ORAL | Status: DC | PRN

## 2016-04-22 MED ORDER — HYDROCHLOROTHIAZIDE 12.5 MG PO CAPS
12.5000 mg | ORAL_CAPSULE | Freq: Every day | ORAL | Status: DC
  Administered 2016-04-23 – 2016-04-24 (×2): 12.5 mg via ORAL
  Filled 2016-04-22 (×2): qty 1

## 2016-04-22 MED ORDER — SODIUM CHLORIDE 0.9 % IV BOLUS (SEPSIS)
500.0000 mL | Freq: Once | INTRAVENOUS | Status: AC
  Administered 2016-04-22: 500 mL via INTRAVENOUS

## 2016-04-22 MED ORDER — ONDANSETRON HCL 4 MG PO TABS: 4.0000 mg | ORAL_TABLET | Freq: Four times a day (QID) | ORAL | Status: DC | PRN

## 2016-04-22 MED ORDER — IRBESARTAN 150 MG PO TABS
150.0000 mg | ORAL_TABLET | Freq: Every day | ORAL | Status: DC
  Administered 2016-04-23 – 2016-04-24 (×2): 150 mg via ORAL
  Filled 2016-04-22 (×2): qty 1

## 2016-04-22 MED ORDER — DICLOFENAC SODIUM 1 % TD GEL
2.0000 g | Freq: Four times a day (QID) | TRANSDERMAL | Status: DC
  Administered 2016-04-22 – 2016-04-24 (×3): 2 g via TOPICAL
  Filled 2016-04-22: qty 100

## 2016-04-22 MED ORDER — ONDANSETRON 4 MG PO TBDP: 4.0000 mg | ORAL_TABLET | Freq: Three times a day (TID) | ORAL | Status: DC | PRN

## 2016-04-22 MED ORDER — FOLIC ACID 0.5 MG HALF TAB
500.0000 ug | ORAL_TABLET | Freq: Every day | ORAL | Status: DC
  Administered 2016-04-23 – 2016-04-24 (×2): 0.5 mg via ORAL
  Filled 2016-04-22 (×2): qty 1

## 2016-04-22 MED ORDER — IOPAMIDOL (ISOVUE-300) INJECTION 61%
INTRAVENOUS | Status: AC
  Administered 2016-04-22: 60 mL
  Filled 2016-04-22: qty 75

## 2016-04-22 MED ORDER — ONDANSETRON HCL 4 MG/2ML IJ SOLN: 4.0000 mg | Freq: Four times a day (QID) | INTRAMUSCULAR | Status: DC | PRN

## 2016-04-22 MED ORDER — IOPAMIDOL (ISOVUE-300) INJECTION 61%
INTRAVENOUS | Status: AC
  Filled 2016-04-22: qty 100

## 2016-04-22 MED ORDER — CALCIUM CARBONATE 1250 (500 CA) MG PO TABS
1250.0000 mg | ORAL_TABLET | Freq: Every day | ORAL | Status: DC
  Administered 2016-04-23 – 2016-04-24 (×2): 1250 mg via ORAL
  Filled 2016-04-22 (×2): qty 1

## 2016-04-22 MED ORDER — PANTOPRAZOLE SODIUM 40 MG PO TBEC
40.0000 mg | DELAYED_RELEASE_TABLET | Freq: Two times a day (BID) | ORAL | Status: DC
  Administered 2016-04-22 – 2016-04-24 (×4): 40 mg via ORAL
  Filled 2016-04-22 (×4): qty 1

## 2016-04-22 MED ORDER — ENOXAPARIN SODIUM 40 MG/0.4ML ~~LOC~~ SOLN
40.0000 mg | SUBCUTANEOUS | Status: DC
  Administered 2016-04-22 – 2016-04-23 (×2): 40 mg via SUBCUTANEOUS
  Filled 2016-04-22 (×2): qty 0.4

## 2016-04-22 MED ORDER — FENTANYL CITRATE (PF) 100 MCG/2ML IJ SOLN
25.0000 ug | Freq: Once | INTRAMUSCULAR | Status: AC
  Administered 2016-04-22: 25 ug via INTRAVENOUS
  Filled 2016-04-22: qty 2

## 2016-04-22 MED ORDER — ALBUTEROL SULFATE (2.5 MG/3ML) 0.083% IN NEBU: 2.5000 mg | INHALATION_SOLUTION | RESPIRATORY_TRACT | Status: DC | PRN

## 2016-04-22 MED ORDER — AMLODIPINE BESYLATE 5 MG PO TABS
5.0000 mg | ORAL_TABLET | Freq: Every day | ORAL | Status: DC
  Administered 2016-04-23 – 2016-04-24 (×2): 5 mg via ORAL
  Filled 2016-04-22 (×2): qty 1

## 2016-04-22 MED ORDER — OMEGA-3-ACID ETHYL ESTERS 1 G PO CAPS
1.0000 g | ORAL_CAPSULE | Freq: Every day | ORAL | Status: DC
  Administered 2016-04-23 – 2016-04-24 (×2): 1 g via ORAL
  Filled 2016-04-22 (×2): qty 1

## 2016-04-22 MED ORDER — FENTANYL CITRATE (PF) 100 MCG/2ML IJ SOLN
50.0000 ug | Freq: Once | INTRAMUSCULAR | Status: AC
  Administered 2016-04-22: 50 ug via INTRAVENOUS
  Filled 2016-04-22: qty 2

## 2016-04-22 MED ORDER — AMLODIPINE-VALSARTAN-HCTZ 5-160-12.5 MG PO TABS: 1.0000 | ORAL_TABLET | Freq: Every day | ORAL | Status: DC

## 2016-04-22 MED ORDER — VITAMIN C 500 MG PO TABS
500.0000 mg | ORAL_TABLET | Freq: Every day | ORAL | Status: DC
  Administered 2016-04-23 – 2016-04-24 (×2): 500 mg via ORAL
  Filled 2016-04-22 (×2): qty 1

## 2016-04-22 NOTE — Discharge Instructions (Signed)
Nausea and Vomiting Nausea is a sick feeling that often comes before throwing up (vomiting). Vomiting is a reflex where stomach contents come out of your mouth. Vomiting can cause severe loss of body fluids (dehydration). Children and elderly adults can become dehydrated quickly, especially if they also have diarrhea. Nausea and vomiting are symptoms of a condition or disease. It is important to find the cause of your symptoms. CAUSES   Direct irritation of the stomach lining. This irritation can result from increased acid production (gastroesophageal reflux disease), infection, food poisoning, taking certain medicines (such as nonsteroidal anti-inflammatory drugs), alcohol use, or tobacco use.  Signals from the brain.These signals could be caused by a headache, heat exposure, an inner ear disturbance, increased pressure in the brain from injury, infection, a tumor, or a concussion, pain, emotional stimulus, or metabolic problems.  An obstruction in the gastrointestinal tract (bowel obstruction).  Illnesses such as diabetes, hepatitis, gallbladder problems, appendicitis, kidney problems, cancer, sepsis, atypical symptoms of a heart attack, or eating disorders.  Medical treatments such as chemotherapy and radiation.  Receiving medicine that makes you sleep (general anesthetic) during surgery. DIAGNOSIS Your caregiver may ask for tests to be done if the problems do not improve after a few days. Tests may also be done if symptoms are severe or if the reason for the nausea and vomiting is not clear. Tests may include:  Urine tests.  Blood tests.  Stool tests.  Cultures (to look for evidence of infection).  X-rays or other imaging studies. Test results can help your caregiver make decisions about treatment or the need for additional tests. TREATMENT You need to stay well hydrated. Drink frequently but in small amounts.You may wish to drink water, sports drinks, clear broth, or eat frozen  ice pops or gelatin dessert to help stay hydrated.When you eat, eating slowly may help prevent nausea.There are also some antinausea medicines that may help prevent nausea. HOME CARE INSTRUCTIONS   Take all medicine as directed by your caregiver.  If you do not have an appetite, do not force yourself to eat. However, you must continue to drink fluids.  If you have an appetite, eat a normal diet unless your caregiver tells you differently.  Eat a variety of complex carbohydrates (rice, wheat, potatoes, bread), lean meats, yogurt, fruits, and vegetables.  Avoid high-fat foods because they are more difficult to digest.  Drink enough water and fluids to keep your urine clear or pale yellow.  If you are dehydrated, ask your caregiver for specific rehydration instructions. Signs of dehydration may include:  Severe thirst.  Dry lips and mouth.  Dizziness.  Dark urine.  Decreasing urine frequency and amount.  Confusion.  Rapid breathing or pulse. SEEK IMMEDIATE MEDICAL CARE IF:   You have blood or brown flecks (like coffee grounds) in your vomit.  You have black or bloody stools.  You have a severe headache or stiff neck.  You are confused.  You have severe abdominal pain.  You have chest pain or trouble breathing.  You do not urinate at least once every 8 hours.  You develop cold or clammy skin.  You continue to vomit for longer than 24 to 48 hours.  You have a fever. MAKE SURE YOU:   Understand these instructions.  Will watch your condition.  Will get help right away if you are not doing well or get worse.   This information is not intended to replace advice given to you by your health care provider. Make sure  you discuss any questions you have with your health care provider.   Document Released: 10/07/2005 Document Revised: 12/30/2011 Document Reviewed: 03/06/2011 Elsevier Interactive Patient Education 2016 [employer].  Weakness Weakness is a lack of  strength. It may be felt all over the body (generalized) or in one specific part of the body (focal). Some causes of weakness can be serious. You may need further medical evaluation, especially if you are elderly or you have a history of immunosuppression (such as chemotherapy or HIV), kidney disease, heart disease, or diabetes. CAUSES  Weakness can be caused by many different things, including:  Infection.  Physical exhaustion.  Internal bleeding or other blood loss that results in a lack of red blood cells (anemia).  Dehydration. This cause is more common in elderly people.  Side effects or electrolyte abnormalities from medicines, such as pain medicines or sedatives.  Emotional distress, anxiety, or depression.  Circulation problems, especially severe peripheral arterial disease.  Heart disease, such as rapid atrial fibrillation, bradycardia, or heart failure.  Nervous system disorders, such as Guillain-Barr syndrome, multiple sclerosis, or stroke. DIAGNOSIS  To find the cause of your weakness, your caregiver will take your history and perform a physical exam. Lab tests or X-rays may also be ordered, if needed. TREATMENT  Treatment of weakness depends on the cause of your symptoms and can vary greatly. HOME CARE INSTRUCTIONS   Rest as needed.  Eat a well-balanced diet.  Try to get some exercise every day.  Only take over-the-counter or prescription medicines as directed by your caregiver. SEEK MEDICAL CARE IF:   Your weakness seems to be getting worse or spreads to other parts of your body.  You develop new aches or pains. SEEK IMMEDIATE MEDICAL CARE IF:   You cannot perform your normal daily activities, such as getting dressed and feeding yourself.  You cannot walk up and down stairs, or you feel exhausted when you do so.  You have shortness of breath or chest pain.  You have difficulty moving parts of your body.  You have weakness in only one area of the body or  on only one side of the body.  You have a fever.  You have trouble speaking or swallowing.  You cannot control your bladder or bowel movements.  You have black or bloody vomit or stools. MAKE SURE YOU:  Understand these instructions.  Will watch your condition.  Will get help right away if you are not doing well or get worse.   This information is not intended to replace advice given to you by your health care provider. Make sure you discuss any questions you have with your health care provider.   Document Released: 10/07/2005 Document Revised: 04/07/2012 Document Reviewed: 12/06/2011 Elsevier Interactive Patient Education [employer].

## 2016-04-22 NOTE — ED Notes (Signed)
Attempted to call report

## 2016-04-22 NOTE — ED Notes (Signed)
Pt ambulatory to bathroom without any problems 

## 2016-04-22 NOTE — ED Notes (Signed)
Patient complains of abdominal pain with vomiting x 2 days, seen in ED a few days ago and informed her renal function off, no vomiting on arrival. Patient complains of weakness and fatigue

## 2016-04-22 NOTE — ED Notes (Signed)
Pt tolerated PO food/fluids w/o difficulty.

## 2016-04-22 NOTE — ED Provider Notes (Signed)
CSN: [id number]     Arrival date & time 04/22/16  1202 History   First MD Initiated Contact with Patient 04/22/16 1439     Chief Complaint  Patient presents with  . Weakness  . Abdominal Pain  . Emesis      Patient is a 82 y.o. female presenting with weakness, abdominal pain, and vomiting. The history is provided by the patient and a relative.  Weakness Associated symptoms include abdominal pain. Pertinent negatives include no shortness of breath.  Abdominal Pain Associated symptoms: nausea and vomiting   Associated symptoms: no constipation, no fever, no hematuria and no shortness of breath   Emesis Associated symptoms: abdominal pain   Patient presents with a few days of weakness. States She's now had nausea and some vomiting. States she started twice. The pain is dull in her mid abdomen. She was seen 2 days ago in the ER but was not have any nausea vomiting at that time. She was worked up for leg pain. No fevers or chills. States that she's been drinking boost to help with her appetite. No dysuria. States she quit drinking alcohol around a month ago. No previous history of pancreatitis. No diarrhea. No constipation. No headache. States she does feel kind of generally weak all over.  Past Medical History  Diagnosis Date  . Anemia of chronic disease   . Anxiety   . Hypertension   . Hepatitis   . Lipid disorder   . Wears glasses   . Renal insufficiency   . Wears dentures     top  . Breast cancer ([place]) 08/28/12    IDC left breast bx=invasive ductal ca,ER/PR=+  . Breast cancer ([place]) 09/01/2012  . Radiation 11/12/12-12/15/11    Left Breast/50 Gy  . RA (rheumatoid arthritis) ([facility])    Past Surgical History  Procedure Laterality Date  . Abdominal hysterectomy       approx 20 years ago  . Appendectomy      20 years ago  . Foot surgery  1992    bone spurs both feet  . Colonoscopy  2009?  [name] Partial mastectomy with needle localization and axillary sentinel lymph node bx  10/08/2012     Procedure: PARTIAL MASTECTOMY WITH NEEDLE LOCALIZATION AND AXILLARY SENTINEL LYMPH NODE BX;  Surgeon: Adin Hector, MD;  Location: [place];  Service: General;  Laterality: Left;  Left partial mastectomy with Needle localization and Sentinel lymph node biospy  . Enteroscopy N/A 11/08/2013    Procedure: ENTEROSCOPY;  Surgeon: Beryle Beams, MD;  Location: [place];  Service: Endoscopy;  Laterality: N/A;  . Bones spur      removed 20 years ago  . Enteroscopy N/A 04/18/2014    Procedure: ENTEROSCOPY;  Surgeon: Beryle Beams, MD;  Location: [place];  Service: Endoscopy;  Laterality: N/A;   Family History  Problem Relation Age of Onset  . Cancer Father   . Prostate cancer Father   . Breast cancer Cousin   . Tuberculosis Mother    Social History  Substance Use Topics  . Smoking status: Former Smoker -- 1.00 packs/day    Types: Cigarettes    Quit date: 10/27/2007  . Smokeless tobacco: Never Used     Comment: started smoking age 20  . Alcohol Use: 0.6 oz/week    1 Glasses of wine per week     Comment:  glass wine per week   OB History    No data available     Review  of Systems  Constitutional: Positive for appetite change. Negative for fever.  HENT: Negative for ear discharge and trouble swallowing.   Respiratory: Negative for shortness of breath.   Gastrointestinal: Positive for nausea, vomiting and abdominal pain. Negative for constipation.  Genitourinary: Negative for hematuria.  Musculoskeletal: Negative for back pain.  Skin: Negative for wound.  Neurological: Positive for weakness.  Hematological: Negative for adenopathy.  Psychiatric/Behavioral: Negative for confusion.      Allergies  Review of patient's allergies indicates no known allergies.  Home Medications   Prior to Admission medications   Medication Sig Start Date End Date Taking? Authorizing Provider  Amlodipine-Valsartan-HCTZ (EXFORGE HCT) 5-160-12.5 MG TABS Take 1 tablet by  mouth daily.   Yes Historical Provider, MD  anastrozole (ARIMIDEX) 1 MG tablet Take 1 tablet (1 mg total) by mouth daily. 10/25/15  Yes Nicholas Lose, MD  calcium carbonate (OS-CAL) 600 MG TABS Take 600 mg by mouth daily.   Yes Historical Provider, MD  Cholecalciferol (VITAMIN D-3) 1000 UNITS CAPS Take 1,000 Units by mouth daily.   Yes Historical Provider, MD  diclofenac sodium (VOLTAREN) 1 % GEL Apply 2 g topically 3 (three) times daily as needed. For arthritis pain   Yes Historical Provider, MD  fluticasone (FLONASE) 50 MCG/ACT nasal spray Place 2 sprays into both nostrils daily as needed for allergies or rhinitis. 11/08/13  Yes Ripudeep Krystal Eaton, MD  folic acid (FOLVITE) A999333 MCG tablet Take 400-800 mcg by mouth daily.   Yes Historical Provider, MD  loratadine (CLARITIN) 10 MG tablet Take 1 tablet (10 mg total) by mouth daily as needed for allergies or rhinitis. 11/08/13  Yes Ripudeep Krystal Eaton, MD  metoCLOPramide (REGLAN) 5 MG tablet Take 1 tablet (5 mg total) by mouth 3 (three) times daily with meals as needed for nausea or vomiting. Please take 15-16mins before the meal 01/31/16  Yes Nicholas Lose, MD  Omega-3 Fatty Acids (FISH OIL) 500 MG CAPS Take 1 capsule by mouth daily.   Yes Historical Provider, MD  omeprazole (PRILOSEC) 20 MG capsule Take 20 mg by mouth daily. 05/09/15  Yes Historical Provider, MD  pantoprazole (PROTONIX) 40 MG tablet Take 1 tablet (40 mg total) by mouth daily. 03/09/15  Yes Ripudeep Krystal Eaton, MD  vitamin C (ASCORBIC ACID) 500 MG tablet Take 500 mg by mouth daily.   Yes Historical Provider, MD  ondansetron (ZOFRAN-ODT) 4 MG disintegrating tablet Take 1 tablet (4 mg total) by mouth every 8 (eight) hours as needed for nausea or vomiting. 04/22/16   Davonna Belling, MD   BP 123/56 mmHg  Pulse 78  Temp(Src) 98.1 F (36.7 C) (Oral)  Resp 18  Ht 5\' 2"  (1.575 m)  Wt 135 lb (61.236 kg)  BMI 24.69 kg/m2  SpO2 100% Physical Exam  Constitutional: She appears well-developed.  HENT:  Head:  Atraumatic.  Neck: Neck supple.  Cardiovascular: Normal rate.   Pulmonary/Chest: Effort normal.  Abdominal: There is tenderness.  Mild epigastric right upper quadrant tenderness without rebound or guarding.  Musculoskeletal: She exhibits no edema.  Neurological: She is alert.  Skin: Skin is warm.    ED Course  Procedures (including critical care time) Labs Review Labs Reviewed  LIPASE, BLOOD - Abnormal; Notable for the following:    Lipase 98 (*)    All other components within normal limits  COMPREHENSIVE METABOLIC PANEL - Abnormal; Notable for the following:    Sodium 131 (*)    Chloride 100 (*)    Glucose, Bld 119 (*)  Creatinine, Ser 1.24 (*)    Albumin 3.4 (*)    AST 52 (*)    GFR calc non Af Amer 39 (*)    GFR calc Af Amer 46 (*)    All other components within normal limits  CBC - Abnormal; Notable for the following:    RBC 3.77 (*)    Hemoglobin 11.3 (*)    HCT 34.3 (*)    All other components within normal limits  URINALYSIS, ROUTINE W REFLEX MICROSCOPIC (NOT AT [facility]) - Abnormal; Notable for the following:    APPearance CLOUDY (*)    Leukocytes, UA TRACE (*)    All other components within normal limits  URINE MICROSCOPIC-ADD ON - Abnormal; Notable for the following:    Squamous Epithelial / LPF 0-5 (*)    Bacteria, UA FEW (*)    All other components within normal limits  I-STAT TROPOININ, ED    Imaging Review Dg Abd Acute W/chest  04/22/2016  CLINICAL DATA:  Abdominal pain EXAM: DG ABDOMEN ACUTE W/ 1V CHEST COMPARISON:  05/15/2015 FINDINGS: Cardiomediastinal silhouette is stable. No acute infiltrate or pleural effusion. No pulmonary edema. Atherosclerotic calcifications of thoracic aorta. Surgical clips are noted left breast region. There is normal small bowel gas pattern. Moderate stool noted in right colon. Some colonic gas noted transverse colon. Some colonic gas noted in distal sigmoid colon and rectum. No evidence of free abdominal air. IMPRESSION: No acute  disease. Normal small bowel gas pattern. Colonic stool and gas as described above. No evidence of free abdominal air. Electronically Signed   By: Lahoma Crocker M.D.   On: 04/22/2016 15:25   I have personally reviewed and evaluated these images and lab results as part of my medical decision-making.   EKG Interpretation   Date/Time:  Monday April 22 2016 12:35:35 EDT Ventricular Rate:  71 PR Interval:  152 QRS Duration: 80 QT Interval:  406 QTC Calculation: 441 R Axis:   28 Text Interpretation:  Normal sinus rhythm Normal ECG Confirmed by  Alvino Chapel  MD, Ovid Curd [id number]) on 04/22/2016 2:40:15 PM      MDM   Final diagnoses:  Generalized weakness  Non-intractable vomiting with nausea, vomiting of unspecified type    Patient with generalized weakness abdominal pain nausea and vomiting. Seen 2 days ago was other complaints. Lab work overall reassuring. Lipase is mildly elevated. Recent ultrasound showed no gallstones. This point does not appear to have obstruction. Will try oral fluids and patient states she is feeling somewhat better after little bit of IV fluids. If continues to improve likely be able to discharge home.    Davonna Belling, MD 04/22/16 [id number]

## 2016-04-22 NOTE — H&P (Signed)
History and Physical    [name]:[id number] DOB: 1932-07-26 DOA: 04/22/2016  Referring MD/NP/PA: Dr. Lita Mains PCP: Elyn Peers, MD  Patient coming from: home   Chief Complaint: abdominal pain   HPI: [name] is a 82 y.o. female with medical history significant of HTN, HLD, anemia of chronic disease, CKD, breast Ca; who presents with complaints of abdominal pain. Symptoms have been ongoing over the last few days. Just recently seen in the ED 2 days ago for lack of energy and feeling tired. At that time she was found to be dehydrated and was given IV fluids and discharged home. She now has complaints of lower to mid epigastric pain more so located on the right hand side of her abdomen that comes and goes. Pain waxes and wanes in intensity, but is generally just dull and achy. Patient notes that she did not try anything to relieve symptoms. Associated symptoms include nausea and at least 2 episodes of vomiting today. She is able to keep some food and liquids down. Patient states that she used to drink like a fish until about 5 years ago and now intermittently has a beer here and there. She states that she was advised to stop drinking altogether, but notes that she had a beer sometime last week.  Denies having any blood in stool, chest pain, diarrhea, palpitations, lower extremity swelling, or weight gain.  ED Course: Upon admission into the right femoral patient was evaluated and seen to be afebrile with initial blood pressure as low as 74/48 which quickly improved after IV fluids. Lab work revealed WBC 6.5, hemoglobin 11.3, sodium of 131, potassium 4.8, chloride 100, CO2 24, BUN 14, creatinine 1.24, glucose 119, lipase 98, and lactic acid 0.92. UA appears to be unremarkable.  Acute abdominal series showed no acute abnormalities. CT scan of the abdomen and pelvis showed extensive arteriovascular calcifications.  Review of Systems: As per HPI otherwise 10 point review of  systems negative.   Past Medical History  Diagnosis Date  . Anemia of chronic disease   . Anxiety   . Hypertension   . Hepatitis   . Lipid disorder   . Wears glasses   . Renal insufficiency   . Wears dentures     top  . Breast cancer ([place]) 08/28/12    IDC left breast bx=invasive ductal ca,ER/PR=+  . Breast cancer ([place]) 09/01/2012  . Radiation 11/12/12-12/15/11    Left Breast/50 Gy  . RA (rheumatoid arthritis) ([facility])     Past Surgical History  Procedure Laterality Date  . Abdominal hysterectomy       approx 20 years ago  . Appendectomy      20 years ago  . Foot surgery  1992    bone spurs both feet  . Colonoscopy  2009?  [name] Partial mastectomy with needle localization and axillary sentinel lymph node bx  10/08/2012    Procedure: PARTIAL MASTECTOMY WITH NEEDLE LOCALIZATION AND AXILLARY SENTINEL LYMPH NODE BX;  Surgeon: Adin Hector, MD;  Location: [place];  Service: General;  Laterality: Left;  Left partial mastectomy with Needle localization and Sentinel lymph node biospy  . Enteroscopy N/A 11/08/2013    Procedure: ENTEROSCOPY;  Surgeon: Beryle Beams, MD;  Location: [place];  Service: Endoscopy;  Laterality: N/A;  . Bones spur      removed 20 years ago  . Enteroscopy N/A 04/18/2014    Procedure: ENTEROSCOPY;  Surgeon: Beryle Beams, MD;  Location: [place];  Service: Endoscopy;  Laterality: N/A;     reports that she quit smoking about 8 years ago. Her smoking use included Cigarettes. She smoked 1.00 pack per day. She has never used smokeless tobacco. She reports that she drinks about 0.6 oz of alcohol per week. She reports that she does not use illicit drugs.  No Known Allergies  Family History  Problem Relation Age of Onset  . Cancer Father   . Prostate cancer Father   . Breast cancer Cousin   . Tuberculosis Mother     Prior to Admission medications   Medication Sig Start Date End Date Taking? Authorizing Provider    Amlodipine-Valsartan-HCTZ (EXFORGE HCT) 5-160-12.5 MG TABS Take 1 tablet by mouth daily.   Yes Historical Provider, MD  anastrozole (ARIMIDEX) 1 MG tablet Take 1 tablet (1 mg total) by mouth daily. 10/25/15  Yes Nicholas Lose, MD  calcium carbonate (OS-CAL) 600 MG TABS Take 600 mg by mouth daily.   Yes Historical Provider, MD  Cholecalciferol (VITAMIN D-3) 1000 UNITS CAPS Take 1,000 Units by mouth daily.   Yes Historical Provider, MD  diclofenac sodium (VOLTAREN) 1 % GEL Apply 2 g topically 3 (three) times daily as needed. For arthritis pain   Yes Historical Provider, MD  fluticasone (FLONASE) 50 MCG/ACT nasal spray Place 2 sprays into both nostrils daily as needed for allergies or rhinitis. 11/08/13  Yes Ripudeep Krystal Eaton, MD  folic acid (FOLVITE) 623 MCG tablet Take 400-800 mcg by mouth daily.   Yes Historical Provider, MD  loratadine (CLARITIN) 10 MG tablet Take 1 tablet (10 mg total) by mouth daily as needed for allergies or rhinitis. 11/08/13  Yes Ripudeep Krystal Eaton, MD  metoCLOPramide (REGLAN) 5 MG tablet Take 1 tablet (5 mg total) by mouth 3 (three) times daily with meals as needed for nausea or vomiting. Please take 15-41mns before the meal 01/31/16  Yes VNicholas Lose MD  Omega-3 Fatty Acids (FISH OIL) 500 MG CAPS Take 1 capsule by mouth daily.   Yes Historical Provider, MD  omeprazole (PRILOSEC) 20 MG capsule Take 20 mg by mouth daily. 05/09/15  Yes Historical Provider, MD  pantoprazole (PROTONIX) 40 MG tablet Take 1 tablet (40 mg total) by mouth daily. 03/09/15  Yes Ripudeep KKrystal Eaton MD  vitamin C (ASCORBIC ACID) 500 MG tablet Take 500 mg by mouth daily.   Yes Historical Provider, MD  ondansetron (ZOFRAN-ODT) 4 MG disintegrating tablet Take 1 tablet (4 mg total) by mouth every 8 (eight) hours as needed for nausea or vomiting. 04/22/16   NDavonna Belling MD    Physical Exam:   Constitutional: Elderly female in NAD, calm, comfortable Filed Vitals:   04/22/16 1903 04/22/16 1930 04/22/16 2045 04/22/16  2100  BP: 129/46 130/52 119/56 136/55  Pulse: 79 77 69 71  Temp:      TempSrc:      Resp: 16     Height:      Weight:      SpO2: 100% 98% 98% 100%   Eyes: PERRL, lids and conjunctivae normal ENMT: Mucous membranes are dry. Posterior pharynx clear of any exudate or lesions.Normal dentition.  Neck: normal, supple, no masses, no thyromegaly Respiratory: clear to auscultation bilaterally, no wheezing, no crackles. Normal respiratory effort. No accessory muscle use.  Cardiovascular: Regular rate and rhythm, no murmurs / rubs / gallops. No extremity edema. 2+ pedal pulses. No carotid bruits.  Abdomen: Mild tenderness to palpation of the right mid and lower quadrants, no masses palpated. No hepatosplenomegaly. Bowel  sounds positive.  Musculoskeletal: no clubbing / cyanosis. No joint deformity upper and lower extremities. Good ROM, no contractures. Normal muscle tone.  Skin: no rashes, lesions, ulcers. No induration Neurologic: CN 2-12 grossly intact. Sensation intact, DTR normal. Strength 5/5 in all 4.  Psychiatric: Normal judgment and insight. Alert and oriented x 3. Normal mood.     Labs on Admission: I have personally reviewed following labs and imaging studies  CBC:  Recent Labs Lab 04/20/16 1333 04/22/16 1240  WBC 5.0 6.5  HGB 11.3* 11.3*  HCT 35.1* 34.3*  MCV 93.9 91.0  PLT 235 840   Basic Metabolic Panel:  Recent Labs Lab 04/20/16 1333 04/22/16 1240  NA 136 131*  K 4.8 4.8  CL 102 100*  CO2 26 24  GLUCOSE 86 119*  BUN 25* 14  CREATININE 1.64* 1.24*  CALCIUM 9.8 9.8   GFR: Estimated Creatinine Clearance: 30.1 mL/min (by C-G formula based on Cr of 1.24). Liver Function Tests:  Recent Labs Lab 04/20/16 1845 04/22/16 1240  AST 55* 52*  ALT 42 44  ALKPHOS 94 91  BILITOT 0.3 0.5  PROT 7.1 7.3  ALBUMIN 3.4* 3.4*    Recent Labs Lab 04/22/16 1240  LIPASE 98*   No results for input(s): AMMONIA in the last 168 hours. Coagulation Profile: No results for  input(s): INR, PROTIME in the last 168 hours. Cardiac Enzymes: No results for input(s): CKTOTAL, CKMB, CKMBINDEX, TROPONINI in the last 168 hours. BNP (last 3 results) No results for input(s): PROBNP in the last 8760 hours. HbA1C: No results for input(s): HGBA1C in the last 72 hours. CBG: No results for input(s): GLUCAP in the last 168 hours. Lipid Profile: No results for input(s): CHOL, HDL, LDLCALC, TRIG, CHOLHDL, LDLDIRECT in the last 72 hours. Thyroid Function Tests: No results for input(s): TSH, T4TOTAL, FREET4, T3FREE, THYROIDAB in the last 72 hours. Anemia Panel: No results for input(s): VITAMINB12, FOLATE, FERRITIN, TIBC, IRON, RETICCTPCT in the last 72 hours. Urine analysis:    Component Value Date/Time   COLORURINE YELLOW 04/22/2016 1540   APPEARANCEUR CLOUDY* 04/22/2016 1540   LABSPEC 1.014 04/22/2016 1540   PHURINE 7.0 04/22/2016 1540   GLUCOSEU NEGATIVE 04/22/2016 1540   HGBUR NEGATIVE 04/22/2016 1540   BILIRUBINUR NEGATIVE 04/22/2016 1540   KETONESUR NEGATIVE 04/22/2016 1540   PROTEINUR NEGATIVE 04/22/2016 1540   UROBILINOGEN 0.2 05/15/2015 1542   NITRITE NEGATIVE 04/22/2016 1540   LEUKOCYTESUR TRACE* 04/22/2016 1540   Sepsis Labs: Recent Results (from the past 240 hour(s))  Urine culture     Status: Abnormal   Collection Time: 04/20/16  5:20 PM  Result Value Ref Range Status   Specimen Description URINE, CLEAN CATCH  Final   Special Requests NONE  Final   Culture MULTIPLE SPECIES PRESENT, SUGGEST RECOLLECTION (A)  Final   Report Status 04/21/2016 FINAL  Final     Radiological Exams on Admission: Ct Abdomen Pelvis W Contrast  04/22/2016  CLINICAL DATA:  Lower abdominal/pelvic pain. History of left breast carcinoma. Intermittent nausea and vomiting EXAM: CT ABDOMEN AND PELVIS WITH CONTRAST TECHNIQUE: Multidetector CT imaging of the abdomen and pelvis was performed using the standard protocol following bolus administration of intravenous contrast. CONTRAST:  60  mL ISOVUE-300 IOPAMIDOL (ISOVUE-300) INJECTION 61% COMPARISON:  Mar 06, 2015 FINDINGS: Lower chest: There is scarring in the lung bases and inferior lingula. There is underlying emphysematous change and bibasilar bronchiectatic change. There is extensive atherosclerotic calcification in the aorta. There are multiple foci coronary artery calcification. Hepatobiliary: No  focal liver lesions are apparent. Gallbladder wall is not appreciably thickened. There is no biliary duct dilatation. Pancreas: There is no pancreatic mass or inflammatory focus. Spleen: No splenic lesions are evident. Adrenals/Urinary Tract: Adrenals bilaterally appear normal. Kidneys bilaterally show no hydronephrosis on either side. There is a 4 mm cyst in the midportion right kidney. There is no appreciable renal or ureteral calculus on either side. Calcification within an ovarian vein on the right is noted immediately adjacent to but separate from the right ureter. Urinary bladder is midline with wall thickness within normal limits. Stomach/Bowel: There are scattered sigmoid diverticula without diverticulitis. There is no bowel wall or mesenteric thickening. There is no appreciable bowel obstruction. No free air or portal venous air. No bowel pneumatosis. Vascular/Lymphatic: There is extensive atherosclerotic calcification in the aortic and iliac arteries. There is also extensive calcification in both hypogastric arteries. There is no abdominal aortic aneurysm. There is moderate calcification at the origins of the superior mesenteric and celiac arteries. There is also 8 calcification at the origins of the renal arteries bilaterally. There is no adenopathy in the abdomen or pelvis. Reproductive: Uterus is absent. There is no pelvic mass. A small amount of free fluid is noted to the right of the rectum in the dependent portion of the pelvis. Other: Appendix is absent. There is no abscess in the abdomen or pelvis. There is a minimal ventral hernia  containing only fat. Musculoskeletal: There is disc narrowing and degeneration at L4-5. There is vacuum phenomenon at this level. There are no blastic or lytic bone lesion. IMPRESSION: Extensive arterial vascular calcification noted. There is calcification at the origins of the major mesenteric vessels as well as multiple foci of coronary artery calcification. No bowel pneumatosis or bowel obstruction. No bowel wall thickening. No abscess. Appendix and uterus absent. A small amount of fluid is noted in the dependent portion of the pelvis to the right of the rectum. Etiology for this small amount of fluid uncertain. Small ventral hernia containing only fat. No renal or ureteral calculi.  No hydronephrosis. Electronically Signed   By: Lowella Grip III M.D.   On: 04/22/2016 19:08   Dg Abd Acute W/chest  04/22/2016  CLINICAL DATA:  Abdominal pain EXAM: DG ABDOMEN ACUTE W/ 1V CHEST COMPARISON:  05/15/2015 FINDINGS: Cardiomediastinal silhouette is stable. No acute infiltrate or pleural effusion. No pulmonary edema. Atherosclerotic calcifications of thoracic aorta. Surgical clips are noted left breast region. There is normal small bowel gas pattern. Moderate stool noted in right colon. Some colonic gas noted transverse colon. Some colonic gas noted in distal sigmoid colon and rectum. No evidence of free abdominal air. IMPRESSION: No acute disease. Normal small bowel gas pattern. Colonic stool and gas as described above. No evidence of free abdominal air. Electronically Signed   By: Lahoma Crocker M.D.   On: 04/22/2016 15:25    EKG: Independently reviewed. Normal sinus rhythm  Assessment/Plan Abdominal pain/suspected pancreatitis: Acute. Patient with intermittent abdominal pain with nausea and vomiting. Lab work showed elevated lipase given patient's history of previous alcohol use. Suspect that this could be secondary to acute pancreatitis. CT scan of the abdomen show signs of extensive arterial calcification.  Also on differential includes intestinal angina - Admit to MedSurg bed - IV fluids normal saline at 125 ml/hr - morphine prn pain control - Check ultrasound of the abdomen - Determine if further investigation into possibility of intestinal angina needs to be pursued   Fatigue - TSH, troponin, ESR, and CRP  Anemia of chronic disease: Stable. Hemoglobin 11.7 on admission. - Repeat CBC in a.m.  Chronic kidney disease stage III: Improved.  patient's creatinine was just previously elevated at 1.64 just 2 days ago and appears to have improved. - Continue to monitor  Essential hypertension - Continued amlodipine-valsartan-HCTZ combination pill for now, but may consider need to   History of breast cancer - Continue anastroxzole   History of alcohol abuse: Patient reports drinking 1. Sometime last week this could've been a precipitant to her acute presentation. - Counseled on the need of avoidance of alcohol  Hyponatremia: Sodium 131 on admission. - Recheck BMP in a.m.  DVT prophylaxis: Lovenox Code Status: Full Family Communication: Discussed plan of care with the patient's son present at bedside Disposition Plan: Possible discharge home in 1-2 days  Consults called: None  Admission status: MedSurg observation  Norval Morton MD [facility] Pager [phone]  If 7PM-7AM, please contact night-coverage [email or web address] Password [email or web address]  04/22/2016, 9:20 PM

## 2016-04-22 NOTE — ED Notes (Signed)
Pt remains at CT

## 2016-04-22 NOTE — ED Notes (Signed)
Pt given crackers and PO fluids for PO challenge.

## 2016-04-23 ENCOUNTER — Encounter (HOSPITAL_COMMUNITY): Payer: Self-pay | Admitting: General Practice

## 2016-04-23 ENCOUNTER — Observation Stay (HOSPITAL_COMMUNITY): Payer: Medicare Other

## 2016-04-23 DIAGNOSIS — K859 Acute pancreatitis without necrosis or infection, unspecified: Secondary | ICD-10-CM | POA: Diagnosis not present

## 2016-04-23 DIAGNOSIS — I10 Essential (primary) hypertension: Secondary | ICD-10-CM | POA: Diagnosis present

## 2016-04-23 DIAGNOSIS — D638 Anemia in other chronic diseases classified elsewhere: Secondary | ICD-10-CM | POA: Diagnosis not present

## 2016-04-23 DIAGNOSIS — C50912 Malignant neoplasm of unspecified site of left female breast: Secondary | ICD-10-CM | POA: Diagnosis not present

## 2016-04-23 DIAGNOSIS — N183 Chronic kidney disease, stage 3 (moderate): Secondary | ICD-10-CM | POA: Diagnosis not present

## 2016-04-23 DIAGNOSIS — K760 Fatty (change of) liver, not elsewhere classified: Secondary | ICD-10-CM | POA: Diagnosis not present

## 2016-04-23 LAB — CBC WITH DIFFERENTIAL/PLATELET
BASOS ABS: 0 10*3/uL (ref 0.0–0.1)
BASOS PCT: 0 %
EOS ABS: 0.1 10*3/uL (ref 0.0–0.7)
Eosinophils Relative: 2 %
HEMATOCRIT: 32.1 % — AB (ref 36.0–46.0)
HEMOGLOBIN: 10.7 g/dL — AB (ref 12.0–15.0)
Lymphocytes Relative: 22 %
Lymphs Abs: 1.2 10*3/uL (ref 0.7–4.0)
MCH: 30.2 pg (ref 26.0–34.0)
MCHC: 33.3 g/dL (ref 30.0–36.0)
MCV: 90.7 fL (ref 78.0–100.0)
Monocytes Absolute: 0.6 10*3/uL (ref 0.1–1.0)
Monocytes Relative: 10 %
NEUTROS ABS: 3.6 10*3/uL (ref 1.7–7.7)
NEUTROS PCT: 66 %
Platelets: 219 10*3/uL (ref 150–400)
RBC: 3.54 MIL/uL — ABNORMAL LOW (ref 3.87–5.11)
RDW: 13.8 % (ref 11.5–15.5)
WBC: 5.4 10*3/uL (ref 4.0–10.5)

## 2016-04-23 LAB — SEDIMENTATION RATE: Sed Rate: 36 mm/hr — ABNORMAL HIGH (ref 0–22)

## 2016-04-23 LAB — COMPREHENSIVE METABOLIC PANEL
ALBUMIN: 3 g/dL — AB (ref 3.5–5.0)
ALT: 40 U/L (ref 14–54)
ANION GAP: 7 (ref 5–15)
AST: 49 U/L — AB (ref 15–41)
Alkaline Phosphatase: 74 U/L (ref 38–126)
BILIRUBIN TOTAL: 0.6 mg/dL (ref 0.3–1.2)
BUN: 10 mg/dL (ref 6–20)
CO2: 23 mmol/L (ref 22–32)
Calcium: 9.4 mg/dL (ref 8.9–10.3)
Chloride: 106 mmol/L (ref 101–111)
Creatinine, Ser: 1.08 mg/dL — ABNORMAL HIGH (ref 0.44–1.00)
GFR calc Af Amer: 54 mL/min — ABNORMAL LOW (ref >60)
GFR calc non Af Amer: 46 mL/min — ABNORMAL LOW (ref >60)
GLUCOSE: 97 mg/dL (ref 65–99)
POTASSIUM: 4 mmol/L (ref 3.5–5.1)
SODIUM: 136 mmol/L (ref 135–145)
TOTAL PROTEIN: 6.7 g/dL (ref 6.5–8.1)

## 2016-04-23 LAB — TROPONIN I: Troponin I: <0.03 ng/mL (ref <0.03)

## 2016-04-23 LAB — TSH: TSH: 3.747 u[IU]/mL (ref 0.350–4.500)

## 2016-04-23 LAB — C-REACTIVE PROTEIN: CRP: <0.5 mg/dL (ref <1.0)

## 2016-04-23 NOTE — Progress Notes (Addendum)
PROGRESS NOTE  [name] [id number] DOB: 08/04/1932 DOA: 04/22/2016 PCP: Elyn Peers, MD     Brief Narrative: 82 y.o. female with medical history significant of HTN, HLD, anemia of chronic disease, CKD, breast Ca; who presents with complaints of abdominal pain, nausea and vomiting, found to have elevated lipase. Has history of heavy ETOH abuse and is currently drinking although not as much.   Assessment & Plan: Principal Problem:   Abdominal pain Active Problems:   Anemia of chronic disease   Breast cancer, left breast (HCC)   CKD (chronic kidney disease), stage III   Pancreatitis, acute   Essential hypertension    Abdominal pain / acute pancreatitis - patient with history of heavy ETOH use, decreased the amount 5 years ago but she tells me that is still drinking occasionally, last heavy drinking about 2 weeks ago. She was told then that she has high risk of liver fibrosis on an [place] and has been having occasional beers since but significantly less alcohol.  - NPO, IVF - CT scan of abdomen with significant atherosclerotic disease, in differential is also ischemia however pain has no relationship with eating, is mostly on right and she has no blood in her stool or melena. Lactic acid was normal on admission  Anemia of chronic disease - Stable. Hemoglobin 11.7 on admission. - Repeat CBC in a.m.  Chronic kidney disease stage III - Cr slightly better than baseline.  - Continue to monitor  Essential hypertension - Continued amlodipine-valsartan-HCTZ combination pill for now  History of breast cancer - Continue anastroxzole   History of alcohol abuse  - Counseled on the need of avoidance of alcohol  Hyponatremia - Sodium 131 on admission. - resolved with fluids.    DVT prophylaxis: Lovenox Code Status: Full Family Communication: no family bedside Disposition Plan: home when ready   Consultants:   None   Procedures:  None   Antimicrobials:  None     Subjective: - complains of abdominal pain. Mild nausea, improved. No vomiting overnight.   Objective: Filed Vitals:   04/22/16 2100 04/22/16 2130 04/22/16 2239 04/23/16 0536  BP: 136/55 134/60 129/51 115/50  Pulse: 71 74 80 82  Temp:   97.5 F (36.4 C) 98 F (36.7 C)  TempSrc:   Oral Oral  Resp:   19 19  Height:      Weight:      SpO2: 100% 97% 98% 100%    Intake/Output Summary (Last 24 hours) at 04/23/16 1313 Last data filed at 04/23/16 0937  Gross per 24 hour  Intake      0 ml  Output    550 ml  Net   -550 ml   Filed Weights   04/22/16 1228  Weight: 61.236 kg (135 lb)    Examination: Constitutional: NAD Filed Vitals:   04/22/16 2100 04/22/16 2130 04/22/16 2239 04/23/16 0536  BP: 136/55 134/60 129/51 115/50  Pulse: 71 74 80 82  Temp:   97.5 F (36.4 C) 98 F (36.7 C)  TempSrc:   Oral Oral  Resp:   19 19  Height:      Weight:      SpO2: 100% 97% 98% 100%   Respiratory: clear to auscultation bilaterally, no wheezing, no crackles. Normal respiratory effort. No accessory muscle use.  Cardiovascular: Regular rate and rhythm, no murmurs / rubs / gallops. No LE edema. 2+ pedal pulses.  Abdomen: epigastric tenderness. Bowel sounds positive.  Musculoskeletal: no clubbing / cyanosis.  Neurologic: CN 2-12 grossly  intact. Strength 5/5 in all 4.  Psychiatric: Normal judgment and insight. Alert and oriented x 3. Normal mood.    Data Reviewed: I have personally reviewed following labs and imaging studies  CBC:  Recent Labs Lab 04/20/16 1333 04/22/16 1240 04/23/16 0553  WBC 5.0 6.5 5.4  NEUTROABS  --   --  3.6  HGB 11.3* 11.3* 10.7*  HCT 35.1* 34.3* 32.1*  MCV 93.9 91.0 90.7  PLT 235 227 [id number]   Basic Metabolic Panel:  Recent Labs Lab 04/20/16 1333 04/22/16 1240 04/23/16 0553  NA 136 131* 136  K 4.8 4.8 4.0  CL 102 100* 106  CO2 26 24 23   GLUCOSE 86 119* 97  BUN 25* 14 10  CREATININE 1.64* 1.24* 1.08*  CALCIUM 9.8 9.8 9.4   GFR: Estimated  Creatinine Clearance: 34.6 mL/min (by C-G formula based on Cr of 1.08). Liver Function Tests:  Recent Labs Lab 04/20/16 1845 04/22/16 1240 04/23/16 0553  AST 55* 52* 49*  ALT 42 44 40  ALKPHOS 94 91 74  BILITOT 0.3 0.5 0.6  PROT 7.1 7.3 6.7  ALBUMIN 3.4* 3.4* 3.0*    Recent Labs Lab 04/22/16 1240  LIPASE 98*   Cardiac Enzymes:  Recent Labs Lab 04/22/16 2328  TROPONINI <0.03   Thyroid Function Tests:  Recent Labs  04/22/16 2328  TSH 3.747   Urine analysis:    Component Value Date/Time   COLORURINE YELLOW 04/22/2016 1540   APPEARANCEUR CLOUDY* 04/22/2016 1540   LABSPEC 1.014 04/22/2016 1540   PHURINE 7.0 04/22/2016 1540   GLUCOSEU NEGATIVE 04/22/2016 1540   HGBUR NEGATIVE 04/22/2016 1540   BILIRUBINUR NEGATIVE 04/22/2016 1540   KETONESUR NEGATIVE 04/22/2016 1540   PROTEINUR NEGATIVE 04/22/2016 1540   UROBILINOGEN 0.2 05/15/2015 1542   NITRITE NEGATIVE 04/22/2016 1540   LEUKOCYTESUR TRACE* 04/22/2016 1540   Sepsis Labs: Invalid input(s): PROCALCITONIN, LACTICIDVEN  Recent Results (from the past 240 hour(s))  Urine culture     Status: Abnormal   Collection Time: 04/20/16  5:20 PM  Result Value Ref Range Status   Specimen Description URINE, CLEAN CATCH  Final   Special Requests NONE  Final   Culture MULTIPLE SPECIES PRESENT, SUGGEST RECOLLECTION (A)  Final   Report Status 04/21/2016 FINAL  Final      Radiology Studies: US Abdomen Complete  04/23/2016  CLINICAL DATA:  Abdominal pain EXAM: ABDOMEN ULTRASOUND COMPLETE COMPARISON:  04/22/2016 FINDINGS: Gallbladder: No gallstones or wall thickening visualized. No sonographic Murphy sign noted by sonographer. Common bile duct: Diameter: 5.6 mm Liver: The liver appears diffusely echogenic. The margin of the liver appear slightly irregular. No focal liver abnormality. IVC: No abnormality visualized. Pancreas: Visualized portion unremarkable. Spleen: Size and appearance within normal limits. Right Kidney: Length:  9.1 cm. Small hypoechoic cyst within the mid right kidney measures 8 mm. Echogenicity within normal limits. No mass or hydronephrosis visualized. Left Kidney: Length: 9.1 cm. Echogenicity within normal limits. No mass or hydronephrosis visualized. Abdominal aorta: Aortic atherosclerosis noted.  No aneurysm. Other findings: None. IMPRESSION: 1. Echogenic liver compatible with hepatic steatosis. The margins of the liver are slightly irregular suggestive of cirrhosis. 2. Aortic atherosclerosis 3. Small hypoechoic structure within the right kidney may represent a complex cyst. Electronically Signed   By: Kerby Moors M.D.   On: 04/23/2016 12:20   Ct Abdomen Pelvis W Contrast  04/22/2016  CLINICAL DATA:  Lower abdominal/pelvic pain. History of left breast carcinoma. Intermittent nausea and vomiting EXAM: CT ABDOMEN AND PELVIS WITH CONTRAST TECHNIQUE:  Multidetector CT imaging of the abdomen and pelvis was performed using the standard protocol following bolus administration of intravenous contrast. CONTRAST:  60 mL ISOVUE-300 IOPAMIDOL (ISOVUE-300) INJECTION 61% COMPARISON:  Mar 06, 2015 FINDINGS: Lower chest: There is scarring in the lung bases and inferior lingula. There is underlying emphysematous change and bibasilar bronchiectatic change. There is extensive atherosclerotic calcification in the aorta. There are multiple foci coronary artery calcification. Hepatobiliary: No focal liver lesions are apparent. Gallbladder wall is not appreciably thickened. There is no biliary duct dilatation. Pancreas: There is no pancreatic mass or inflammatory focus. Spleen: No splenic lesions are evident. Adrenals/Urinary Tract: Adrenals bilaterally appear normal. Kidneys bilaterally show no hydronephrosis on either side. There is a 4 mm cyst in the midportion right kidney. There is no appreciable renal or ureteral calculus on either side. Calcification within an ovarian vein on the right is noted immediately adjacent to but  separate from the right ureter. Urinary bladder is midline with wall thickness within normal limits. Stomach/Bowel: There are scattered sigmoid diverticula without diverticulitis. There is no bowel wall or mesenteric thickening. There is no appreciable bowel obstruction. No free air or portal venous air. No bowel pneumatosis. Vascular/Lymphatic: There is extensive atherosclerotic calcification in the aortic and iliac arteries. There is also extensive calcification in both hypogastric arteries. There is no abdominal aortic aneurysm. There is moderate calcification at the origins of the superior mesenteric and celiac arteries. There is also 8 calcification at the origins of the renal arteries bilaterally. There is no adenopathy in the abdomen or pelvis. Reproductive: Uterus is absent. There is no pelvic mass. A small amount of free fluid is noted to the right of the rectum in the dependent portion of the pelvis. Other: Appendix is absent. There is no abscess in the abdomen or pelvis. There is a minimal ventral hernia containing only fat. Musculoskeletal: There is disc narrowing and degeneration at L4-5. There is vacuum phenomenon at this level. There are no blastic or lytic bone lesion. IMPRESSION: Extensive arterial vascular calcification noted. There is calcification at the origins of the major mesenteric vessels as well as multiple foci of coronary artery calcification. No bowel pneumatosis or bowel obstruction. No bowel wall thickening. No abscess. Appendix and uterus absent. A small amount of fluid is noted in the dependent portion of the pelvis to the right of the rectum. Etiology for this small amount of fluid uncertain. Small ventral hernia containing only fat. No renal or ureteral calculi.  No hydronephrosis. Electronically Signed   By: Lowella Grip III M.D.   On: 04/22/2016 19:08   Dg Abd Acute W/chest  04/22/2016  CLINICAL DATA:  Abdominal pain EXAM: DG ABDOMEN ACUTE W/ 1V CHEST COMPARISON:   05/15/2015 FINDINGS: Cardiomediastinal silhouette is stable. No acute infiltrate or pleural effusion. No pulmonary edema. Atherosclerotic calcifications of thoracic aorta. Surgical clips are noted left breast region. There is normal small bowel gas pattern. Moderate stool noted in right colon. Some colonic gas noted transverse colon. Some colonic gas noted in distal sigmoid colon and rectum. No evidence of free abdominal air. IMPRESSION: No acute disease. Normal small bowel gas pattern. Colonic stool and gas as described above. No evidence of free abdominal air. Electronically Signed   By: Lahoma Crocker M.D.   On: 04/22/2016 15:25     Scheduled Meds: . amLODipine  5 mg Oral Daily   And  . irbesartan  150 mg Oral Daily   And  . hydrochlorothiazide  12.5 mg Oral Daily  .  anastrozole  1 mg Oral Daily  . calcium carbonate  1,250 mg Oral Q breakfast  . cholecalciferol  1,000 Units Oral Daily  . diclofenac sodium  2 g Topical QID  . enoxaparin (LOVENOX) injection  40 mg Subcutaneous Q24H  . folic acid  [id number] mcg Oral Daily  . omega-3 acid ethyl esters  1 g Oral Daily  . pantoprazole  40 mg Oral BID  . vitamin C  500 mg Oral Daily   Continuous Infusions: . sodium chloride 100 mL/hr at 04/23/16 1215     Marzetta Board, MD, PhD [facility] Pager [phone]  If 7PM-7AM, please contact night-coverage [email or web address] Password [email or web address] 04/23/2016, 1:13 PM

## 2016-04-24 DIAGNOSIS — R109 Unspecified abdominal pain: Secondary | ICD-10-CM

## 2016-04-24 DIAGNOSIS — K859 Acute pancreatitis without necrosis or infection, unspecified: Secondary | ICD-10-CM

## 2016-04-24 LAB — COMPREHENSIVE METABOLIC PANEL
ALK PHOS: 76 U/L (ref 38–126)
ALT: 38 U/L (ref 14–54)
AST: 48 U/L — AB (ref 15–41)
Albumin: 2.9 g/dL — ABNORMAL LOW (ref 3.5–5.0)
Anion gap: 9 (ref 5–15)
BILIRUBIN TOTAL: 0.4 mg/dL (ref 0.3–1.2)
BUN: 8 mg/dL (ref 6–20)
CALCIUM: 9.4 mg/dL (ref 8.9–10.3)
CHLORIDE: 109 mmol/L (ref 101–111)
CO2: 21 mmol/L — ABNORMAL LOW (ref 22–32)
CREATININE: 1.14 mg/dL — AB (ref 0.44–1.00)
GFR calc Af Amer: 50 mL/min — ABNORMAL LOW (ref >60)
GFR, EST NON AFRICAN AMERICAN: 44 mL/min — AB (ref >60)
Glucose, Bld: 100 mg/dL — ABNORMAL HIGH (ref 65–99)
Potassium: 3.7 mmol/L (ref 3.5–5.1)
Sodium: 139 mmol/L (ref 135–145)
TOTAL PROTEIN: 6.3 g/dL — AB (ref 6.5–8.1)

## 2016-04-24 LAB — CBC
HEMATOCRIT: 31.9 % — AB (ref 36.0–46.0)
HEMOGLOBIN: 10.5 g/dL — AB (ref 12.0–15.0)
MCH: 30.5 pg (ref 26.0–34.0)
MCHC: 32.9 g/dL (ref 30.0–36.0)
MCV: 92.7 fL (ref 78.0–100.0)
Platelets: 210 10*3/uL (ref 150–400)
RBC: 3.44 MIL/uL — AB (ref 3.87–5.11)
RDW: 14.2 % (ref 11.5–15.5)
WBC: 4.2 10*3/uL (ref 4.0–10.5)

## 2016-04-24 LAB — LIPASE, BLOOD: LIPASE: 78 U/L — AB (ref 11–51)

## 2016-04-24 NOTE — Progress Notes (Signed)
Discussed discharge summary summary with patient. Reviewed all medications with patient. Patient ready for discharge.

## 2016-04-24 NOTE — Discharge Summary (Signed)
[name], is a 82 y.o. female  DOB 06-21-1932  MRN [id number].  Admission date:  04/22/2016  Admitting Physician  Norval Morton, MD  Discharge Date:  04/24/2016   Primary MD  Elyn Peers, MD  Recommendations for primary care physician for things to follow:  - Check CBC, CMP , lipase during next visit   Admission Diagnosis  Epigastric pain [R10.13] Generalized weakness [R53.1] Elevated lipase [R74.8] Non-intractable vomiting with nausea, vomiting of unspecified type [R11.2]   Discharge Diagnosis  Epigastric pain [R10.13] Generalized weakness [R53.1] Elevated lipase [R74.8] Non-intractable vomiting with nausea, vomiting of unspecified type [R11.2]    Principal Problem:   Abdominal pain Active Problems:   Anemia of chronic disease   Breast cancer, left breast ([place])   CKD (chronic kidney disease), stage III   Pancreatitis, acute   Essential hypertension      Past Medical History  Diagnosis Date  . Anemia of chronic disease   . Anxiety   . Hypertension   . Hepatitis   . Lipid disorder   . Wears glasses   . Renal insufficiency   . Wears dentures     top  . Breast cancer ([place]) 08/28/12    IDC left breast bx=invasive ductal ca,ER/PR=+  . Breast cancer ([place]) 09/01/2012  . Radiation 11/12/12-12/15/11    Left Breast/50 Gy  . RA (rheumatoid arthritis) ([place])   . Abdominal pain 04/2016    Past Surgical History  Procedure Laterality Date  . Abdominal hysterectomy       approx 20 years ago  . Appendectomy      20 years ago  . Foot surgery  1992    bone spurs both feet  . Colonoscopy  2009?  [name] Partial mastectomy with needle localization and axillary sentinel lymph node bx  10/08/2012    Procedure: PARTIAL MASTECTOMY WITH NEEDLE LOCALIZATION AND AXILLARY SENTINEL LYMPH NODE BX;  Surgeon: Adin Hector, MD;  Location: [facility];  Service: General;  Laterality:  Left;  Left partial mastectomy with Needle localization and Sentinel lymph node biospy  . Enteroscopy N/A 11/08/2013    Procedure: ENTEROSCOPY;  Surgeon: Beryle Beams, MD;  Location: [facility];  Service: Endoscopy;  Laterality: N/A;  . Bones spur      removed 20 years ago  . Enteroscopy N/A 04/18/2014    Procedure: ENTEROSCOPY;  Surgeon: Beryle Beams, MD;  Location: [facility];  Service: Endoscopy;  Laterality: N/A;       History of present illness and  Hospital Course:     Kindly see H&P for history of present illness and admission details, please review complete Labs, Consult reports and Test reports for all details in brief  HPI  from the history and physical done on the day of admission 04/22/2016 HPI: [name] is a 82 y.o. female with medical history significant of HTN, HLD, anemia of chronic disease, CKD, breast Ca; who presents with complaints of abdominal pain. Symptoms have been ongoing over the last few  days. Just recently seen in the ED 2 days ago for lack of energy and feeling tired. At that time she was found to be dehydrated and was given IV fluids and discharged home. She now has complaints of lower to mid epigastric pain more so located on the right hand side of her abdomen that comes and goes. Pain waxes and wanes in intensity, but is generally just dull and achy. Patient notes that she did not try anything to relieve symptoms. Associated symptoms include nausea and at least 2 episodes of vomiting today. She is able to keep some food and liquids down. Patient states that she used to drink like a fish until about 5 years ago and now intermittently has a beer here and there. She states that she was advised to stop drinking altogether, but notes that she had a beer sometime last week. Denies having any blood in stool, chest pain, diarrhea, palpitations, lower extremity swelling, or weight gain.  ED Course: Upon admission into the right femoral patient was evaluated  and seen to be afebrile with initial blood pressure as low as 74/48 which quickly improved after IV fluids. Lab work revealed WBC 6.5, hemoglobin 11.3, sodium of 131, potassium 4.8, chloride 100, CO2 24, BUN 14, creatinine 1.24, glucose 119, lipase 98, and lactic acid 0.92. UA appears to be unremarkable. Acute abdominal series showed no acute abnormalities. CT scan of the abdomen and pelvis showed extensive arteriovascular calcifications.   Hospital Course  82 y.o. female with medical history significant of HTN, HLD, anemia of chronic disease, CKD, breast Ca; who presents with complaints of abdominal pain, nausea and vomiting, found to have elevated lipase. Has history of heavy ETOH abuse and is currently drinking although not as much.   Abdominal pain / acute pancreatitis - patient with history of heavy ETOH use, decreased the amount 5 years ago but she tells me that is still drinking occasionally, last heavy drinking about 2 weeks ago. She was told then that she has high risk of liver fibrosis on an [place] and has been having occasional beers since but significantly less alcohol. - CT scan of abdomen with significant atherosclerotic disease, in differential is also ischemia however pain has no relationship with eating, is mostly on right and she has no blood in her stool or melena. Lactic acid was normal on admission - Lipase trending down, tolerating by mouth intake, no further nausea or vomiting since admission, patient encouraged to increase fluid intake, chest and have low fat diet, and to stick with complete abstinence from alcohol.  Anemia of chronic disease - Stable. Hemoglobin 11.7 on admission.  Chronic kidney disease stage III - Cr slightly better than baseline.    Essential hypertension - Continued amlodipine-valsartan-HCTZ .  History of breast cancer - Continue anastroxzole   History of alcohol abuse  - Counseled on the need of avoidance of alcohol  Hyponatremia - Sodium  131 on admission. - resolved with fluids.    Discharge Condition:  stable   Follow UP  Follow-up Information    Follow up with Elyn Peers, MD.   Specialty:  Family Medicine   Why:  As needed   Contact information:   [address] [place] [address] [phone]         Discharge Instructions  and  Discharge Medications    Discharge Instructions    Diet - low sodium heart healthy    Complete by:  As directed      Discharge  instructions    Complete by:  As directed   Follow with Primary MD Elyn Peers, MD in 7 days   Get CBC, CMP, checked  by Primary MD next visit.    Activity: As tolerated with Full fall precautions use walker/cane & assistance as needed   Disposition Home **   Diet: Heart Healthy, low fat , with feeding assistance and aspiration precautions.  For Heart failure patients - Check your Weight same time everyday, if you gain over 2 pounds, or you develop in leg swelling, experience more shortness of breath or chest pain, call your Primary MD immediately. Follow Cardiac Low Salt Diet and 1.5 lit/day fluid restriction.   On your next visit with your primary care physician please Get Medicines reviewed and adjusted.   Please request your Prim.MD to go over all Hospital Tests and Procedure/Radiological results at the follow up, please get all Hospital records sent to your Prim MD by signing hospital release before you go home.   If you experience worsening of your admission symptoms, develop shortness of breath, life threatening emergency, suicidal or homicidal thoughts you must seek medical attention immediately by calling 911 or calling your MD immediately  if symptoms less severe.  You Must read complete instructions/literature along with all the possible adverse reactions/side effects for all the Medicines you take and that have been prescribed to you. Take any new Medicines after you have completely understood and accpet all the possible  adverse reactions/side effects.   Do not drive, operating heavy machinery, perform activities at heights, swimming or participation in water activities or provide baby sitting services if your were admitted for syncope or siezures until you have seen by Primary MD or a Neurologist and advised to do so again.  Do not drive when taking Pain medications.    Do not take more than prescribed Pain, Sleep and Anxiety Medications  Special Instructions: If you have smoked or chewed Tobacco  in the last 2 yrs please stop smoking, stop any regular Alcohol  and or any Recreational drug use.  Wear Seat belts while driving.   Please note  You were cared for by a hospitalist during your hospital stay. If you have any questions about your discharge medications or the care you received while you were in the hospital after you are discharged, you can call the unit and asked to speak with the hospitalist on call if the hospitalist that took care of you is not available. Once you are discharged, your primary care physician will handle any further medical issues. Please note that NO REFILLS for any discharge medications will be authorized once you are discharged, as it is imperative that you return to your primary care physician (or establish a relationship with a primary care physician if you do not have one) for your aftercare needs so that they can reassess your need for medications and monitor your lab values.     Increase activity slowly    Complete by:  As directed             Medication List    TAKE these medications        anastrozole 1 MG tablet  Commonly known as:  ARIMIDEX  Take 1 tablet (1 mg total) by mouth daily.     calcium carbonate 600 MG Tabs tablet  Commonly known as:  OS-CAL  Take 600 mg by mouth daily.     diclofenac sodium 1 % Gel  Commonly known as:  VOLTAREN  Apply 2 g topically 3 (three) times daily as needed. For arthritis pain     EXFORGE HCT 5-160-12.5 MG Tabs  Generic  drug:  Amlodipine-Valsartan-HCTZ  Take 1 tablet by mouth daily.     Fish Oil 500 MG Caps  Take 1 capsule by mouth daily.     fluticasone 50 MCG/ACT nasal spray  Commonly known as:  FLONASE  Place 2 sprays into both nostrils daily as needed for allergies or rhinitis.     folic acid A999333 MCG tablet  Commonly known as:  FOLVITE  Take 400-800 mcg by mouth daily.     loratadine 10 MG tablet  Commonly known as:  CLARITIN  Take 1 tablet (10 mg total) by mouth daily as needed for allergies or rhinitis.     metoCLOPramide 5 MG tablet  Commonly known as:  REGLAN  Take 1 tablet (5 mg total) by mouth 3 (three) times daily with meals as needed for nausea or vomiting. Please take 15-67mins before the meal     omeprazole 20 MG capsule  Commonly known as:  PRILOSEC  Take 20 mg by mouth daily.     ondansetron 4 MG disintegrating tablet  Commonly known as:  ZOFRAN-ODT  Take 1 tablet (4 mg total) by mouth every 8 (eight) hours as needed for nausea or vomiting.     pantoprazole 40 MG tablet  Commonly known as:  PROTONIX  Take 1 tablet (40 mg total) by mouth daily.     vitamin C 500 MG tablet  Commonly known as:  ASCORBIC ACID  Take 500 mg by mouth daily.     Vitamin D-3 1000 units Caps  Take 1,000 Units by mouth daily.          Diet and Activity recommendation: See Discharge Instructions above   Consults obtained -  none   Major procedures and Radiology Reports - PLEASE review detailed and final reports for all details, in brief -      US Abdomen Complete  04/23/2016  CLINICAL DATA:  Abdominal pain EXAM: ABDOMEN ULTRASOUND COMPLETE COMPARISON:  04/22/2016 FINDINGS: Gallbladder: No gallstones or wall thickening visualized. No sonographic Murphy sign noted by sonographer. Common bile duct: Diameter: 5.6 mm Liver: The liver appears diffusely echogenic. The margin of the liver appear slightly irregular. No focal liver abnormality. IVC: No abnormality visualized. Pancreas: Visualized  portion unremarkable. Spleen: Size and appearance within normal limits. Right Kidney: Length: 9.1 cm. Small hypoechoic cyst within the mid right kidney measures 8 mm. Echogenicity within normal limits. No mass or hydronephrosis visualized. Left Kidney: Length: 9.1 cm. Echogenicity within normal limits. No mass or hydronephrosis visualized. Abdominal aorta: Aortic atherosclerosis noted.  No aneurysm. Other findings: None. IMPRESSION: 1. Echogenic liver compatible with hepatic steatosis. The margins of the liver are slightly irregular suggestive of cirrhosis. 2. Aortic atherosclerosis 3. Small hypoechoic structure within the right kidney may represent a complex cyst. Electronically Signed   By: Kerby Moors M.D.   On: 04/23/2016 12:20   Ct Abdomen Pelvis W Contrast  04/22/2016  CLINICAL DATA:  Lower abdominal/pelvic pain. History of left breast carcinoma. Intermittent nausea and vomiting EXAM: CT ABDOMEN AND PELVIS WITH CONTRAST TECHNIQUE: Multidetector CT imaging of the abdomen and pelvis was performed using the standard protocol following bolus administration of intravenous contrast. CONTRAST:  60 mL ISOVUE-300 IOPAMIDOL (ISOVUE-300) INJECTION 61% COMPARISON:  Mar 06, 2015 FINDINGS: Lower chest: There is scarring in the lung bases and inferior lingula. There is underlying emphysematous change and bibasilar bronchiectatic  change. There is extensive atherosclerotic calcification in the aorta. There are multiple foci coronary artery calcification. Hepatobiliary: No focal liver lesions are apparent. Gallbladder wall is not appreciably thickened. There is no biliary duct dilatation. Pancreas: There is no pancreatic mass or inflammatory focus. Spleen: No splenic lesions are evident. Adrenals/Urinary Tract: Adrenals bilaterally appear normal. Kidneys bilaterally show no hydronephrosis on either side. There is a 4 mm cyst in the midportion right kidney. There is no appreciable renal or ureteral calculus on either side.  Calcification within an ovarian vein on the right is noted immediately adjacent to but separate from the right ureter. Urinary bladder is midline with wall thickness within normal limits. Stomach/Bowel: There are scattered sigmoid diverticula without diverticulitis. There is no bowel wall or mesenteric thickening. There is no appreciable bowel obstruction. No free air or portal venous air. No bowel pneumatosis. Vascular/Lymphatic: There is extensive atherosclerotic calcification in the aortic and iliac arteries. There is also extensive calcification in both hypogastric arteries. There is no abdominal aortic aneurysm. There is moderate calcification at the origins of the superior mesenteric and celiac arteries. There is also 8 calcification at the origins of the renal arteries bilaterally. There is no adenopathy in the abdomen or pelvis. Reproductive: Uterus is absent. There is no pelvic mass. A small amount of free fluid is noted to the right of the rectum in the dependent portion of the pelvis. Other: Appendix is absent. There is no abscess in the abdomen or pelvis. There is a minimal ventral hernia containing only fat. Musculoskeletal: There is disc narrowing and degeneration at L4-5. There is vacuum phenomenon at this level. There are no blastic or lytic bone lesion. IMPRESSION: Extensive arterial vascular calcification noted. There is calcification at the origins of the major mesenteric vessels as well as multiple foci of coronary artery calcification. No bowel pneumatosis or bowel obstruction. No bowel wall thickening. No abscess. Appendix and uterus absent. A small amount of fluid is noted in the dependent portion of the pelvis to the right of the rectum. Etiology for this small amount of fluid uncertain. Small ventral hernia containing only fat. No renal or ureteral calculi.  No hydronephrosis. Electronically Signed   By: Lowella Grip III M.D.   On: 04/22/2016 19:08   Dg Abd Acute W/chest  04/22/2016   CLINICAL DATA:  Abdominal pain EXAM: DG ABDOMEN ACUTE W/ 1V CHEST COMPARISON:  05/15/2015 FINDINGS: Cardiomediastinal silhouette is stable. No acute infiltrate or pleural effusion. No pulmonary edema. Atherosclerotic calcifications of thoracic aorta. Surgical clips are noted left breast region. There is normal small bowel gas pattern. Moderate stool noted in right colon. Some colonic gas noted transverse colon. Some colonic gas noted in distal sigmoid colon and rectum. No evidence of free abdominal air. IMPRESSION: No acute disease. Normal small bowel gas pattern. Colonic stool and gas as described above. No evidence of free abdominal air. Electronically Signed   By: Lahoma Crocker M.D.   On: 04/22/2016 15:25   US Abdomen Complete W/elastography  04/11/2016  CLINICAL DATA:  Hepatitis-C carrier for 1 month. EXAM: ULTRASOUND ABDOMEN COMPLETE ULTRASOUND HEPATIC ELASTOGRAPHY TECHNIQUE: Sonography of the upper abdomen was performed. In addition, ultrasound elastography evaluation of the liver was performed. A region of interest was placed within the right lobe of the liver. Following application of a compressive sonographic pulse, shear waves were detected in the adjacent hepatic tissue and the shear wave velocity was calculated. Multiple assessments were performed at the selected site. Median shear wave velocity is correlated  to a Metavir fibrosis score. COMPARISON:  CT 03/06/2015 FINDINGS: ULTRASOUND ABDOMEN Gallbladder: Gallbladder has a normal appearance. Gallbladder wall is 1.1 mm, within normal limits. No stones or pericholecystic fluid. No sonographic Murphy's sign. Common bile duct: Diameter: 4.8 mm Liver: No focal lesion identified. Within normal limits in parenchymal echogenicity. IVC: No abnormality visualized. Pancreas: Visualized portion unremarkable. Spleen: Size and appearance within normal limits. Right Kidney: Length: 9.3 cm. Echogenicity within normal limits. No mass or hydronephrosis visualized. Left  Kidney: Length: 9.1 cm. Echogenicity within normal limits. No mass or hydronephrosis visualized. Abdominal aorta: Significant atherosclerotic plaque noted. No aneurysm. Other findings: None. ULTRASOUND HEPATIC ELASTOGRAPHY Device: Siemens Helix VTQ Patient position:  Oblique Transducer 6 C1 Number of measurements:  10 Hepatic Segment:  8 Median velocity:   2.41  m/sec IQR: 0.41 IQR/Median velocity ratio 0.17 Corresponding Metavir fibrosis score:  Some F3 + F4 Risk of fibrosis: High Limitations of exam: None Pertinent findings noted on other imaging exams:  None Please note that abnormal shear wave velocities may also be identified in clinical settings other than with hepatic fibrosis, such as: acute hepatitis, elevated right heart and central venous pressures including use of beta blockers, veno-occlusive disease (Budd-Chiari), infiltrative processes such as mastocytosis/amyloidosis/infiltrative tumor, extrahepatic cholestasis, in the post-prandial state, and liver transplantation. Correlation with patient history, laboratory data, and clinical condition recommended. IMPRESSION: 1. No evidence for acute cholecystitis. 2. No focal liver lesions. 3. Median hepatic shear wave velocity is calculated at 2.41 m/sec. 4. Corresponding Metavir fibrosis score is Some F3 + F4. 5. Risk of fibrosis is high. 6. Follow-up:  Followup is recommended. 7. Significant atherosclerotic calcification of the abdominal aorta. Electronically Signed   By: Nolon Nations M.D.   On: 04/11/2016 12:13    Micro Results    Recent Results (from the past 240 hour(s))  Urine culture     Status: Abnormal   Collection Time: 04/20/16  5:20 PM  Result Value Ref Range Status   Specimen Description URINE, CLEAN CATCH  Final   Special Requests NONE  Final   Culture MULTIPLE SPECIES PRESENT, SUGGEST RECOLLECTION (A)  Final   Report Status 04/21/2016 FINAL  Final       Today   Subjective:   [name] today has no headache,no  chest  Pain,  feels much better wants to go home today, abdominal pain significantly improved, tolerating oral intake, no further nausea or vomiting.  Objective:   Blood pressure 109/48, pulse 75, temperature 98 F (36.7 C), temperature source Oral, resp. rate 16, height 5\' 2"  (1.575 m), weight 61.236 kg (135 lb), SpO2 97 %.   Intake/Output Summary (Last 24 hours) at 04/24/16 1244 Last data filed at 04/24/16 0830  Gross per 24 hour  Intake    500 ml  Output   1650 ml  Net  -1150 ml    Exam Awake Alert, Oriented x 3, No new F.N deficits, Normal affect [place].AT,PERRAL Supple Neck,No JVD, No cervical lymphadenopathy appriciated.  Symmetrical Chest wall movement, Good air movement bilaterally, CTAB RRR,No Gallops,Rubs or new Murmurs, No Parasternal Heave +ve B.Sounds, Abd Soft, Non tender, No organomegaly appriciated, No rebound -guarding or rigidity. No Cyanosis, Clubbing or edema, No new Rash or bruise  Data Review   CBC w Diff: Lab Results  Component Value Date   WBC 4.2 04/24/2016   WBC 5.9 09/19/2015   WBC 3.1* 09/06/2010   HGB 10.5* 04/24/2016   HGB 12.0 09/19/2015   HGB 10.4* 09/06/2010   HCT 31.9*  04/24/2016   HCT 36.9 09/19/2015   HCT 29.8* 09/06/2010   PLT 210 04/24/2016   PLT 282 09/19/2015   PLT 210 09/06/2010   LYMPHOPCT 22 04/23/2016   LYMPHOPCT 19.8 09/19/2015   LYMPHOPCT 35.0 09/06/2010   MONOPCT 10 04/23/2016   MONOPCT 9.3 09/19/2015   MONOPCT 12.2 09/06/2010   EOSPCT 2 04/23/2016   EOSPCT 1.3 09/19/2015   EOSPCT 5.0 09/06/2010   BASOPCT 0 04/23/2016   BASOPCT 1.0 09/19/2015   BASOPCT 0.2 09/06/2010    CMP: Lab Results  Component Value Date   NA 139 04/24/2016   NA 140 09/19/2015   NA 138 04/12/2010   K 3.7 04/24/2016   K 4.6 09/19/2015   K 4.8* 04/12/2010   CL 109 04/24/2016   CL 105 12/28/2012   CL 101 04/12/2010   CO2 21* 04/24/2016   CO2 26 09/19/2015   CO2 26 04/12/2010   BUN 8 04/24/2016   BUN 18.3 09/19/2015   BUN 14 04/12/2010     CREATININE 1.14* 04/24/2016   CREATININE 1.4* 09/19/2015   CREATININE 1.2 04/12/2010   PROT 6.3* 04/24/2016   PROT 8.2 09/19/2015   PROT 8.1 04/12/2010   ALBUMIN 2.9* 04/24/2016   ALBUMIN 3.6 09/19/2015   ALBUMIN 4.1 04/12/2010   BILITOT 0.4 04/24/2016   BILITOT 0.37 09/19/2015   BILITOT 0.50 04/12/2010   ALKPHOS 76 04/24/2016   ALKPHOS 101 09/19/2015   ALKPHOS 106* 04/12/2010   AST 48* 04/24/2016   AST 34 09/19/2015   AST 80* 04/12/2010   ALT 38 04/24/2016   ALT 26 09/19/2015   ALT 69* 04/12/2010  .   Total Time in preparing paper work, data evaluation and todays exam - 35 minutes  ELGERGAWY, DAWOOD M.D on 04/24/2016 at 12:44 PM  [facility]   Office  [phone]

## 2016-04-24 NOTE — Care Management Obs Status (Signed)
[place] NOTIFICATION   Patient Details  Name: [name] MRN: [id number] Date of Birth: 01-Jul-1932   Medicare Observation Status Notification Given:  Yes    Marilu Favre, RN 04/24/2016, 10:26 AM

## 2016-04-26 DIAGNOSIS — K8500 Idiopathic acute pancreatitis without necrosis or infection: Secondary | ICD-10-CM | POA: Diagnosis not present

## 2016-04-26 DIAGNOSIS — Z6822 Body mass index (BMI) 22.0-22.9, adult: Secondary | ICD-10-CM | POA: Diagnosis not present

## 2016-04-26 DIAGNOSIS — R110 Nausea: Secondary | ICD-10-CM | POA: Diagnosis not present

## 2016-05-02 DIAGNOSIS — F1099 Alcohol use, unspecified with unspecified alcohol-induced disorder: Secondary | ICD-10-CM | POA: Diagnosis not present

## 2016-05-02 DIAGNOSIS — I10 Essential (primary) hypertension: Secondary | ICD-10-CM | POA: Diagnosis not present

## 2016-05-02 DIAGNOSIS — K852 Alcohol induced acute pancreatitis without necrosis or infection: Secondary | ICD-10-CM | POA: Diagnosis not present

## 2016-05-02 DIAGNOSIS — K8500 Idiopathic acute pancreatitis without necrosis or infection: Secondary | ICD-10-CM | POA: Diagnosis not present

## 2016-05-17 DIAGNOSIS — K8500 Idiopathic acute pancreatitis without necrosis or infection: Secondary | ICD-10-CM | POA: Diagnosis not present

## 2016-05-17 DIAGNOSIS — R110 Nausea: Secondary | ICD-10-CM | POA: Diagnosis not present

## 2016-05-28 DIAGNOSIS — Z0000 Encounter for general adult medical examination without abnormal findings: Secondary | ICD-10-CM | POA: Diagnosis not present

## 2016-05-28 DIAGNOSIS — K8500 Idiopathic acute pancreatitis without necrosis or infection: Secondary | ICD-10-CM | POA: Diagnosis not present

## 2016-05-28 DIAGNOSIS — M1000 Idiopathic gout, unspecified site: Secondary | ICD-10-CM | POA: Diagnosis not present

## 2016-05-30 ENCOUNTER — Ambulatory Visit: Payer: Medicare Other | Admitting: Hematology and Oncology

## 2016-05-30 DIAGNOSIS — B182 Chronic viral hepatitis C: Secondary | ICD-10-CM | POA: Diagnosis not present

## 2016-06-19 DIAGNOSIS — Z6822 Body mass index (BMI) 22.0-22.9, adult: Secondary | ICD-10-CM | POA: Diagnosis not present

## 2016-06-19 DIAGNOSIS — I10 Essential (primary) hypertension: Secondary | ICD-10-CM | POA: Diagnosis not present

## 2016-06-19 DIAGNOSIS — M1000 Idiopathic gout, unspecified site: Secondary | ICD-10-CM | POA: Diagnosis not present

## 2016-07-09 ENCOUNTER — Ambulatory Visit (HOSPITAL_BASED_OUTPATIENT_CLINIC_OR_DEPARTMENT_OTHER): Payer: Medicare Other | Admitting: Hematology and Oncology

## 2016-07-09 ENCOUNTER — Encounter: Payer: Self-pay | Admitting: Hematology and Oncology

## 2016-07-09 DIAGNOSIS — C50912 Malignant neoplasm of unspecified site of left female breast: Secondary | ICD-10-CM | POA: Diagnosis not present

## 2016-07-09 NOTE — Progress Notes (Signed)
Patient Care Team: Lucianne Lei, MD as PCP - General (Family Medicine)  SUMMARY OF ONCOLOGIC HISTORY:   Breast cancer, left breast ([place])   08/25/2012 Initial Diagnosis    Breast cancer, left breast: Invasive ductal carcinoma grade 1-2 ER/PR positive HER-2 negative Ki-67 14%      10/08/2012 Surgery    Left lumpectomy and sentinel lymph node biopsy: 2.1 cm invasive ductal carcinoma 3 sentinel lymph nodes negative posterior margin reexcision ER positive PR positive HER-2 negative Ki-67 14% grade 1-2      11/10/2012 - 12/11/2012 Radiation Therapy    Adjuvant radiation therapy      12/28/2012 -  Anti-estrogen oral therapy    Arimidex 1 mg daily       CHIEF COMPLIANT: Follow-up on Arimidex therapy    INTERVAL HISTORY: [name] is a 82 year old with above-mentioned history of left breast cancer currently on Arimidex therapy. She is tolerating it extremely well. She was recently hospitalized for epigastric pain and elevated lipase. She had a CT of the abdomen which did not reveal any acute abnormalities. She denies any pain or discomfort in the breast. Occasional mild discomfort in the right axilla.  REVIEW OF SYSTEMS:   Constitutional: Denies fevers, chills or abnormal weight loss Eyes: Denies blurriness of vision Ears, nose, mouth, throat, and face: Denies mucositis or sore throat Respiratory: Denies cough, dyspnea or wheezes Cardiovascular: Denies palpitation, chest discomfort Gastrointestinal:  Denies nausea, heartburn or change in bowel habits Skin: Denies abnormal skin rashes Lymphatics: Denies new lymphadenopathy or easy bruising Neurological:Denies numbness, tingling or new weaknesses Behavioral/Psych: Mood is stable, no new changes  Extremities: No lower extremity edema Breast:  denies any pain or lumps or nodules in either breasts All other systems were reviewed with the patient and are negative.  I have reviewed the past medical history, past surgical history,  social history and family history with the patient and they are unchanged from previous note.  ALLERGIES:  has No Known Allergies.  MEDICATIONS:  Current Outpatient Prescriptions  Medication Sig Dispense Refill  . Amlodipine-Valsartan-HCTZ (EXFORGE HCT) 5-160-12.5 MG TABS Take 1 tablet by mouth daily.    [name] anastrozole (ARIMIDEX) 1 MG tablet Take 1 tablet (1 mg total) by mouth daily. 90 tablet 7  . calcium carbonate (OS-CAL) 600 MG TABS Take 600 mg by mouth daily.    . Cholecalciferol (VITAMIN D-3) 1000 UNITS CAPS Take 1,000 Units by mouth daily.    . diclofenac sodium (VOLTAREN) 1 % GEL Apply 2 g topically 3 (three) times daily as needed. For arthritis pain    . fluticasone (FLONASE) 50 MCG/ACT nasal spray Place 2 sprays into both nostrils daily as needed for allergies or rhinitis. 16 g 2  . folic acid (FOLVITE) 696 MCG tablet Take 400-800 mcg by mouth daily.    [name] loratadine (CLARITIN) 10 MG tablet Take 1 tablet (10 mg total) by mouth daily as needed for allergies or rhinitis. 30 tablet 3  . metoCLOPramide (REGLAN) 5 MG tablet Take 1 tablet (5 mg total) by mouth 3 (three) times daily with meals as needed for nausea or vomiting. Please take 15-81mns before the meal 90 tablet 3  . Omega-3 Fatty Acids (FISH OIL) 500 MG CAPS Take 1 capsule by mouth daily.    .Marland Kitchenomeprazole (PRILOSEC) 20 MG capsule Take 20 mg by mouth daily.  10  . ondansetron (ZOFRAN-ODT) 4 MG disintegrating tablet Take 1 tablet (4 mg total) by mouth every 8 (eight) hours as needed for nausea  or vomiting. 10 tablet 0  . pantoprazole (PROTONIX) 40 MG tablet Take 1 tablet (40 mg total) by mouth daily. 30 tablet 3  . vitamin C (ASCORBIC ACID) 500 MG tablet Take 500 mg by mouth daily.     No current facility-administered medications for this visit.     PHYSICAL EXAMINATION: ECOG PERFORMANCE STATUS: 1 - Symptomatic but completely ambulatory  Vitals:   07/09/16 1101  BP: (!) 126/53  Pulse: 80  Resp: 18  Temp: 98.1 F (36.7 C)     Filed Weights   07/09/16 1101  Weight: 127 lb (57.6 kg)    GENERAL:alert, no distress and comfortable SKIN: skin color, texture, turgor are normal, no rashes or significant lesions EYES: normal, Conjunctiva are pink and non-injected, sclera clear OROPHARYNX:no exudate, no erythema and lips, buccal mucosa, and tongue normal  NECK: supple, thyroid normal size, non-tender, without nodularity LYMPH:  no palpable lymphadenopathy in the cervical, axillary or inguinal LUNGS: clear to auscultation and percussion with normal breathing effort HEART: regular rate & rhythm and no murmurs and no lower extremity edema ABDOMEN:abdomen soft, non-tender and normal bowel sounds MUSCULOSKELETAL:no cyanosis of digits and no clubbing  NEURO: alert & oriented x 3 with fluent speech, no focal motor/sensory deficits EXTREMITIES: No lower extremity edema BREAST: No palpable masses or nodules in either right or left breasts. No palpable axillary supraclavicular or infraclavicular adenopathy no breast tenderness or nipple discharge. (exam performed in the presence of a chaperone)  LABORATORY DATA:  I have reviewed the data as listed   Chemistry      Component Value Date/Time   NA 139 04/24/2016 0638   NA 140 09/19/2015 1056   K 3.7 04/24/2016 0638   K 4.6 09/19/2015 1056   CL 109 04/24/2016 0638   CL 105 12/28/2012 1059   CO2 21 (L) 04/24/2016 0638   CO2 26 09/19/2015 1056   BUN 8 04/24/2016 0638   BUN 18.3 09/19/2015 1056   CREATININE 1.14 (H) 04/24/2016 0638   CREATININE 1.4 (H) 09/19/2015 1056      Component Value Date/Time   CALCIUM 9.4 04/24/2016 0638   CALCIUM 10.2 09/19/2015 1056   ALKPHOS 76 04/24/2016 0638   ALKPHOS 101 09/19/2015 1056   AST 48 (H) 04/24/2016 0638   AST 34 09/19/2015 1056   ALT 38 04/24/2016 0638   ALT 26 09/19/2015 1056   BILITOT 0.4 04/24/2016 0638   BILITOT 0.37 09/19/2015 1056       Lab Results  Component Value Date   WBC 4.2 04/24/2016   HGB 10.5 (L)  04/24/2016   HCT 31.9 (L) 04/24/2016   MCV 92.7 04/24/2016   PLT 210 04/24/2016   NEUTROABS 3.6 04/23/2016     ASSESSMENT & PLAN:  Breast cancer, left breast ([facility]) Left breast invasive ductal carcinoma T2 N0 M0 stage II a 2.1 cm grade 1 ER/PR positive HER-2 negative diagnosed November 2013 status post lumpectomy and radiation currently on oral Arimidex started March 2014.  Anastrozole toxicities: Intermittent hot flashes  Breast Cancer Surveillance: 1. Breast exam 07/09/2016: Scar tissue in the left breast 2. Mammogram  12/18/2015 No abnormalities. Postsurgical changes. Breast Density Category B . I recommended that she get 3-D mammograms for surveillance. Discussed the differences between different breast density categories.   Recent hospitalization for epigastric discomfort with elevated lipase Syncope: No further episodes since last year.  Return to clinic in 1 year for follow-up     No orders of the defined types were placed in this  encounter.  The patient has a good understanding of the overall plan. she agrees with it. she will call with any problems that may develop before the next visit here.   Rulon Eisenmenger, MD 07/09/16

## 2016-07-09 NOTE — Assessment & Plan Note (Signed)
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  Anastrozole toxicities: Intermittent hot flashes  Breast Cancer Surveillance: 1. Breast exam 07/09/2016: Scar tissue in the left breast 2. Mammogram  No abnormalities. Postsurgical changes. Breast Density Category . I recommended that she get 3-D mammograms for surveillance. Discussed the differences between different breast density categories.   Recent hospitalization for epigastric discomfort with elevated lipase Syncope: No further episodes since last year.  Return to clinic in 1 year for follow-up

## 2016-07-18 DIAGNOSIS — R748 Abnormal levels of other serum enzymes: Secondary | ICD-10-CM | POA: Diagnosis not present

## 2016-07-18 DIAGNOSIS — N189 Chronic kidney disease, unspecified: Secondary | ICD-10-CM | POA: Diagnosis not present

## 2016-07-18 DIAGNOSIS — M1A00X0 Idiopathic chronic gout, unspecified site, without tophus (tophi): Secondary | ICD-10-CM | POA: Diagnosis not present

## 2016-07-18 DIAGNOSIS — Z09 Encounter for follow-up examination after completed treatment for conditions other than malignant neoplasm: Secondary | ICD-10-CM | POA: Diagnosis not present

## 2016-08-06 ENCOUNTER — Other Ambulatory Visit: Payer: Self-pay

## 2016-08-06 ENCOUNTER — Emergency Department (HOSPITAL_COMMUNITY)
Admission: EM | Admit: 2016-08-06 | Discharge: 2016-08-06 | Disposition: A | Discharge status: Home / Self Care | Payer: Medicare Other | Attending: Emergency Medicine | Admitting: Emergency Medicine

## 2016-08-06 ENCOUNTER — Encounter (HOSPITAL_COMMUNITY): Payer: Self-pay | Admitting: Emergency Medicine

## 2016-08-06 ENCOUNTER — Emergency Department (HOSPITAL_COMMUNITY): Payer: Medicare Other

## 2016-08-06 DIAGNOSIS — Z87891 Personal history of nicotine dependence: Secondary | ICD-10-CM | POA: Insufficient documentation

## 2016-08-06 DIAGNOSIS — N3000 Acute cystitis without hematuria: Secondary | ICD-10-CM | POA: Insufficient documentation

## 2016-08-06 DIAGNOSIS — J069 Acute upper respiratory infection, unspecified: Secondary | ICD-10-CM | POA: Insufficient documentation

## 2016-08-06 DIAGNOSIS — N183 Chronic kidney disease, stage 3 (moderate): Secondary | ICD-10-CM | POA: Insufficient documentation

## 2016-08-06 DIAGNOSIS — Z853 Personal history of malignant neoplasm of breast: Secondary | ICD-10-CM | POA: Diagnosis not present

## 2016-08-06 DIAGNOSIS — R05 Cough: Secondary | ICD-10-CM | POA: Diagnosis not present

## 2016-08-06 DIAGNOSIS — R55 Syncope and collapse: Secondary | ICD-10-CM | POA: Diagnosis not present

## 2016-08-06 DIAGNOSIS — I129 Hypertensive chronic kidney disease with stage 1 through stage 4 chronic kidney disease, or unspecified chronic kidney disease: Secondary | ICD-10-CM | POA: Diagnosis not present

## 2016-08-06 LAB — BASIC METABOLIC PANEL
ANION GAP: 10 (ref 5–15)
BUN: 9 mg/dL (ref 6–20)
CALCIUM: 10.1 mg/dL (ref 8.9–10.3)
CO2: 25 mmol/L (ref 22–32)
Chloride: 99 mmol/L — ABNORMAL LOW (ref 101–111)
Creatinine, Ser: 1.32 mg/dL — ABNORMAL HIGH (ref 0.44–1.00)
GFR calc non Af Amer: 36 mL/min — ABNORMAL LOW (ref >60)
GFR, EST AFRICAN AMERICAN: 42 mL/min — AB (ref >60)
GLUCOSE: 112 mg/dL — AB (ref 65–99)
POTASSIUM: 4.6 mmol/L (ref 3.5–5.1)
Sodium: 134 mmol/L — ABNORMAL LOW (ref 135–145)

## 2016-08-06 LAB — HEPATIC FUNCTION PANEL
ALK PHOS: 102 U/L (ref 38–126)
ALT: 19 U/L (ref 14–54)
AST: 28 U/L (ref 15–41)
Albumin: 3.5 g/dL (ref 3.5–5.0)
BILIRUBIN TOTAL: 0.7 mg/dL (ref 0.3–1.2)
Bilirubin, Direct: <0.1 mg/dL — ABNORMAL LOW (ref 0.1–0.5)
TOTAL PROTEIN: 7.9 g/dL (ref 6.5–8.1)

## 2016-08-06 LAB — AMMONIA: AMMONIA: 13 umol/L (ref 9–35)

## 2016-08-06 LAB — I-STAT TROPONIN, ED: TROPONIN I, POC: 0.01 ng/mL (ref 0.00–0.08)

## 2016-08-06 LAB — URINE MICROSCOPIC-ADD ON

## 2016-08-06 LAB — URINALYSIS, ROUTINE W REFLEX MICROSCOPIC
Bilirubin Urine: NEGATIVE
Glucose, UA: NEGATIVE mg/dL
HGB URINE DIPSTICK: NEGATIVE
Ketones, ur: NEGATIVE mg/dL
NITRITE: NEGATIVE
PH: 7 (ref 5.0–8.0)
Protein, ur: NEGATIVE mg/dL
SPECIFIC GRAVITY, URINE: 1.012 (ref 1.005–1.030)

## 2016-08-06 LAB — CBC
HEMATOCRIT: 31.9 % — AB (ref 36.0–46.0)
HEMOGLOBIN: 10.9 g/dL — AB (ref 12.0–15.0)
MCH: 29.7 pg (ref 26.0–34.0)
MCHC: 34.2 g/dL (ref 30.0–36.0)
MCV: 86.9 fL (ref 78.0–100.0)
Platelets: 253 10*3/uL (ref 150–400)
RBC: 3.67 MIL/uL — AB (ref 3.87–5.11)
RDW: 14.5 % (ref 11.5–15.5)
WBC: 5.7 10*3/uL (ref 4.0–10.5)

## 2016-08-06 LAB — CBG MONITORING, ED
GLUCOSE-CAPILLARY: 106 mg/dL — AB (ref 65–99)
GLUCOSE-CAPILLARY: 95 mg/dL (ref 65–99)

## 2016-08-06 LAB — I-STAT CG4 LACTIC ACID, ED
Lactic Acid, Venous: 1.46 mmol/L (ref 0.5–1.9)
Lactic Acid, Venous: 1.49 mmol/L (ref 0.5–1.9)

## 2016-08-06 LAB — POC OCCULT BLOOD, ED: Fecal Occult Bld: NEGATIVE

## 2016-08-06 LAB — TSH: TSH: 1.784 u[IU]/mL (ref 0.350–4.500)

## 2016-08-06 MED ORDER — FOSFOMYCIN TROMETHAMINE 3 G PO PACK
3.0000 g | PACK | Freq: Once | ORAL | Status: AC
  Administered 2016-08-06: 3 g via ORAL
  Filled 2016-08-06: qty 3

## 2016-08-06 MED ORDER — SODIUM CHLORIDE 0.9 % IV BOLUS (SEPSIS)
1000.0000 mL | Freq: Once | INTRAVENOUS | Status: AC
  Administered 2016-08-06: 1000 mL via INTRAVENOUS

## 2016-08-06 MED ORDER — ONDANSETRON HCL 4 MG/2ML IJ SOLN
4.0000 mg | Freq: Once | INTRAMUSCULAR | Status: AC
  Administered 2016-08-06: 4 mg via INTRAVENOUS
  Filled 2016-08-06: qty 2

## 2016-08-06 MED ORDER — ONDANSETRON HCL 4 MG PO TABS: 4.0000 mg | ORAL_TABLET | Freq: Three times a day (TID) | ORAL | 0 refills | Status: DC | PRN

## 2016-08-06 NOTE — ED Triage Notes (Signed)
Patient states she stays anemic.  Patient states "Donnald Garre been fighting a cold for a couple of days.  This morning and I just felt woozy and I almost passed out, so I sat down."  Patient denies LOC. Denies other symptoms.

## 2016-08-06 NOTE — ED Provider Notes (Signed)
[facility] DEPT Provider Note   CSN: [id number] Arrival date & time: 08/06/16  0902     History   Chief Complaint Chief Complaint  Patient presents with  . Near Syncope    HPI [name] is a 82 y.o. female with a past medical history significant for hepatitis C, prior breast cancer, hypertension, and history of renal insufficiency who presents with lightheadedness, fatigue, cough, and foul-smelling urine. Patient reports that for the last few days, she has had some URI-like symptoms with some chills, congestion, and cough with yellow sputum. She denies any leg pain, leg swelling, history of DVT or PE. She denies any chest pain, palpitations, or shortness of breath. She says that today, she had a "funny" feeling where she was lightheaded. She said down and his symptoms improved. She did not pass out or have any syncopal episodes. She denies any recent trauma. No head injuries. She does report that she has had decreased by mouth intake for the last few days as her appetite has been lower. She denies any constipation, diarrhea, or dysuria. Denies any blood in her bowel movements.    The history is provided by the patient, a relative and medical records. No language interpreter was used.  Cough  This is a new problem. The current episode started more than 2 days ago. The problem occurs constantly. The problem has not changed since onset.The cough is productive of sputum. There has been no fever. Associated symptoms include chills and rhinorrhea. Pertinent negatives include no chest pain, no sweats, no headaches, no shortness of breath and no wheezing. She has tried nothing for the symptoms.    Past Medical History:  Diagnosis Date  . Abdominal pain 04/2016  . Anemia of chronic disease   . Anxiety   . Breast cancer ([place]) 08/28/12   IDC left breast bx=invasive ductal ca,ER/PR=+  . Breast cancer ([place]) 09/01/2012  . Hepatitis   . Hypertension   . Lipid disorder   . RA  (rheumatoid arthritis) ([place])   . Radiation 11/12/12-12/15/11   Left Breast/50 Gy  . Renal insufficiency   . Wears dentures    top  . Wears glasses     Patient Active Problem List   Diagnosis Date Noted  . Pancreatitis, acute 04/23/2016  . Essential hypertension 04/23/2016  . Upper abdominal pain 04/22/2016  . Abdominal pain 04/22/2016  . Vasovagal syncope 09/19/2015  . Postprandial nausea 03/08/2015  . Syncope 03/07/2015  . Dyspnea on exertion 03/07/2015  . Blood loss anemia 04/16/2014  . Acute blood loss anemia 04/16/2014  . Melena 04/16/2014  . CKD (chronic kidney disease), stage III 03/06/2014  . Nonspecific abnormal finding in stool contents 11/06/2013  . Anemia 11/05/2013  . Renal insufficiency   . Breast cancer, left breast ([place]) 09/01/2012  . Alcohol use 07/06/2012  . Near syncope 07/06/2012  . Hepatitis   . Lipid disorder   . Anxiety   . Hypertension   . Anemia of chronic disease 04/16/2012    Past Surgical History:  Procedure Laterality Date  . ABDOMINAL HYSTERECTOMY      approx 20 years ago  . APPENDECTOMY     20 years ago  . bones spur     removed 20 years ago  . COLONOSCOPY  2009?  . ENTEROSCOPY N/A 11/08/2013   Procedure: ENTEROSCOPY;  Surgeon: Beryle Beams, MD;  Location: [facility] ENDOSCOPY;  Service: Endoscopy;  Laterality: N/A;  . ENTEROSCOPY N/A 04/18/2014   Procedure: ENTEROSCOPY;  Surgeon: Tory Emerald  Benson Norway, MD;  Location: [facility];  Service: Endoscopy;  Laterality: N/A;  . FOOT SURGERY  1992   bone spurs both feet  . PARTIAL MASTECTOMY WITH NEEDLE LOCALIZATION AND AXILLARY SENTINEL LYMPH NODE BX  10/08/2012   Procedure: PARTIAL MASTECTOMY WITH NEEDLE LOCALIZATION AND AXILLARY SENTINEL LYMPH NODE BX;  Surgeon: Adin Hector, MD;  Location: [facility];  Service: General;  Laterality: Left;  Left partial mastectomy with Needle localization and Sentinel lymph node biospy    OB History    No data available       Home Medications      Prior to Admission medications   Medication Sig Start Date End Date Taking? Authorizing Provider  Amlodipine-Valsartan-HCTZ (EXFORGE HCT) 5-160-12.5 MG TABS Take 1 tablet by mouth daily.    Historical Provider, MD  anastrozole (ARIMIDEX) 1 MG tablet Take 1 tablet (1 mg total) by mouth daily. 10/25/15   Nicholas Lose, MD  calcium carbonate (OS-CAL) 600 MG TABS Take 600 mg by mouth daily.    Historical Provider, MD  Cholecalciferol (VITAMIN D-3) 1000 UNITS CAPS Take 1,000 Units by mouth daily.    Historical Provider, MD  diclofenac sodium (VOLTAREN) 1 % GEL Apply 2 g topically 3 (three) times daily as needed. For arthritis pain    Historical Provider, MD  fluticasone (FLONASE) 50 MCG/ACT nasal spray Place 2 sprays into both nostrils daily as needed for allergies or rhinitis. 11/08/13   Ripudeep Krystal Eaton, MD  folic acid (FOLVITE) A999333 MCG tablet Take 400-800 mcg by mouth daily.    Historical Provider, MD  loratadine (CLARITIN) 10 MG tablet Take 1 tablet (10 mg total) by mouth daily as needed for allergies or rhinitis. 11/08/13   Ripudeep Krystal Eaton, MD  metoCLOPramide (REGLAN) 5 MG tablet Take 1 tablet (5 mg total) by mouth 3 (three) times daily with meals as needed for nausea or vomiting. Please take 15-5mins before the meal 01/31/16   Nicholas Lose, MD  Omega-3 Fatty Acids (FISH OIL) 500 MG CAPS Take 1 capsule by mouth daily.    Historical Provider, MD  omeprazole (PRILOSEC) 20 MG capsule Take 20 mg by mouth daily. 05/09/15   Historical Provider, MD  ondansetron (ZOFRAN-ODT) 4 MG disintegrating tablet Take 1 tablet (4 mg total) by mouth every 8 (eight) hours as needed for nausea or vomiting. 04/22/16   Davonna Belling, MD  pantoprazole (PROTONIX) 40 MG tablet Take 1 tablet (40 mg total) by mouth daily. 03/09/15   Ripudeep Krystal Eaton, MD  vitamin C (ASCORBIC ACID) 500 MG tablet Take 500 mg by mouth daily.    Historical Provider, MD    Family History Family History  Problem Relation Age of Onset  . Cancer Father   .  Prostate cancer Father   . Tuberculosis Mother   . Breast cancer Cousin     Social History Social History  Substance Use Topics  . Smoking status: Former Smoker    Packs/day: 1.00    Types: Cigarettes    Quit date: 10/27/2007  . Smokeless tobacco: Never Used     Comment: started smoking age 89  . Alcohol use 0.6 oz/week    1 Glasses of wine per week     Comment:  glass wine per week     Allergies   Review of patient's allergies indicates no known allergies.   Review of Systems Review of Systems  Constitutional: Positive for appetite change (decreaed), chills and fatigue. Negative for diaphoresis and fever.  HENT: Positive  for congestion and rhinorrhea.   Eyes: Negative for visual disturbance.  Respiratory: Positive for cough. Negative for chest tightness, shortness of breath, wheezing and stridor.   Cardiovascular: Negative for chest pain, palpitations and leg swelling.  Gastrointestinal: Positive for nausea. Negative for constipation and diarrhea.  Genitourinary: Negative for dysuria.  Musculoskeletal: Negative for back pain, neck pain and neck stiffness.  Skin: Negative for rash and wound.  Neurological: Positive for light-headedness. Negative for dizziness, seizures, weakness, numbness and headaches.  Psychiatric/Behavioral: Negative for agitation.  All other systems reviewed and are negative.    Physical Exam Updated Vital Signs BP (!) 134/45 (BP Location: Right Arm)   Pulse (!) 55   Temp 98 F (36.7 C) (Oral)   Resp 18   Ht 5\' 3"  (1.6 m)   Wt 130 lb (59 kg)   SpO2 98%   BMI 23.03 kg/m   Physical Exam  Constitutional: She is oriented to person, place, and time. She appears well-developed and well-nourished. No distress.  HENT:  Head: Normocephalic and atraumatic.  Mouth/Throat: Oropharynx is clear and moist. No oropharyngeal exudate.  Eyes: Conjunctivae and EOM are normal. Pupils are equal, round, and reactive to light.  Neck: Neck supple.    Cardiovascular: Normal rate and regular rhythm.   No murmur heard. Pulmonary/Chest: Effort normal and breath sounds normal. No stridor. No respiratory distress. She has no wheezes. She has no rales. She exhibits no tenderness.  Abdominal: Soft. There is no tenderness.  Musculoskeletal: She exhibits no edema.  Neurological: She is alert and oriented to person, place, and time. She has normal reflexes. She is not disoriented. She displays normal reflexes. No cranial nerve deficit or sensory deficit. She exhibits normal muscle tone. Coordination normal. GCS eye subscore is 4. GCS verbal subscore is 5. GCS motor subscore is 6.  Skin: Skin is warm and dry. Capillary refill takes less than 2 seconds. No rash noted. She is not diaphoretic. No erythema.  Psychiatric: She has a normal mood and affect.  Nursing note and vitals reviewed.    ED Treatments / Results  Labs (all labs ordered are listed, but only abnormal results are displayed) Labs Reviewed  BASIC METABOLIC PANEL - Abnormal; Notable for the following:       Result Value   Sodium 134 (*)    Chloride 99 (*)    Glucose, Bld 112 (*)    Creatinine, Ser 1.32 (*)    GFR calc non Af Amer 36 (*)    GFR calc Af Amer 42 (*)    All other components within normal limits  CBC - Abnormal; Notable for the following:    RBC 3.67 (*)    Hemoglobin 10.9 (*)    HCT 31.9 (*)    All other components within normal limits  URINALYSIS, ROUTINE W REFLEX MICROSCOPIC (NOT AT [facility]) - Abnormal; Notable for the following:    Leukocytes, UA TRACE (*)    All other components within normal limits  HEPATIC FUNCTION PANEL - Abnormal; Notable for the following:    Bilirubin, Direct <0.1 (*)    All other components within normal limits  URINE MICROSCOPIC-ADD ON - Abnormal; Notable for the following:    Squamous Epithelial / LPF 0-5 (*)    Bacteria, UA RARE (*)    All other components within normal limits  CBG MONITORING, ED - Abnormal; Notable for the  following:    Glucose-Capillary 106 (*)    All other components within normal limits  URINE CULTURE  AMMONIA  TSH  POC OCCULT BLOOD, ED  I-STAT TROPOININ, ED  I-STAT CG4 LACTIC ACID, ED  CBG MONITORING, ED  I-STAT CG4 LACTIC ACID, ED    EKG  EKG Interpretation  Date/Time:  Tuesday August 06 2016 09:21:25 EDT Ventricular Rate:  57 PR Interval:  166 QRS Duration: 80 QT Interval:  444 QTC Calculation: 432 R Axis:   6 Text Interpretation:  Sinus bradycardia Minimal voltage criteria for LVH, may be normal variant Septal infarct , age undetermined Abnormal ECG Appears similar to prior NO STEMI Confirmed by [facility] MD, [place] [phone]) on 08/06/2016 3:37:22 PM       Radiology Dg Chest 2 View  Result Date: 08/06/2016 CLINICAL DATA:  Cough and syncope. EXAM: CHEST  2 VIEW COMPARISON:  04/22/2016 abdominal series and 05/15/2015 chest x-ray FINDINGS: The heart size and mediastinal contours are within normal limits. Stable aortic atherosclerosis with calcified plaque evident. There is no evidence of pulmonary edema, consolidation, pneumothorax, nodule or pleural fluid. The visualized skeletal structures are unremarkable. IMPRESSION: No active cardiopulmonary disease. Electronically Signed   By: Aletta Edouard M.D.   On: 08/06/2016 14:14    Procedures Procedures (including critical care time)  Medications Ordered in ED Medications  sodium chloride 0.9 % bolus 1,000 mL (0 mLs Intravenous Stopped 08/06/16 1206)  sodium chloride 0.9 % bolus 1,000 mL (1,000 mLs Intravenous New Bag/Given 08/06/16 1526)  fosfomycin (MONUROL) packet 3 g (3 g Oral Given 08/06/16 1539)  ondansetron (ZOFRAN) injection 4 mg (4 mg Intravenous Given 08/06/16 1525)     Initial Impression / Assessment and Plan / ED Course  I have reviewed the triage vital signs and the nursing notes.  Pertinent labs & imaging results that were available during my care of the patient were reviewed by me and considered in my  medical decision making (see chart for details).  Clinical Course   CYNNAMON KENTER is a 82 y.o. female with a past medical history significant for hepatitis C, prior breast cancer, hypertension, and history of renal insufficiency who presents with lightheadedness, fatigue, cough, and foul-smelling urine.   History and exam are seen above.  On exam, patient had no significant findings aside from some congestion. No focal abnormality on auscultation of the lungs, no abdominal tenderness, and normal neurologic exam. No numbness, tingling, weakness, or coordination issues. Laboratory and imaging testing results are seen above.  Hepatic function panel showed nonelevated liver function. Ammonia nonelevated, troponin negative, lactic acid normal, and electrolytes showed similar creatinine to prior at 1.3. With the fatigue and report of a dark stool recently, Given patient's urinary symptoms with foul-smelling urine, suspect  urinalysis does reflect UTI. Patient had leukocytes and rare bacteria seen. Patient treated with fosfomycin.  Patient given fluids for rehydration. Patient had some improvement in her lightheadedness. Patient stood up and continued to have lightheadedness but denied any palpitations, shortness of breath, or chest pain. Feel patient is fatigue due to UTI and likely viral URI.  Patient given a second liter of fluid and will be reassessed. Pt had resolution of lightheadedness.   Pt will be discharged with Zofran for further management as well as strict return precautions and follow-up instructions.     Final Clinical Impressions(s) / ED Diagnoses   Final diagnoses:  Near syncope  Acute cystitis without hematuria  Upper respiratory tract infection, unspecified type    Clinical Impression: 1. Near syncope   2. Acute cystitis without hematuria   3. Upper respiratory tract infection, unspecified type  Disposition: Discharge  Condition: Good  I have discussed the  results, Dx and Tx plan with the pt(& family if present). He/she/they expressed understanding and agree(s) with the plan. Discharge instructions discussed at great length. Strict return precautions discussed and pt &/or family have verbalized understanding of the instructions. No further questions at time of discharge.    Discharge Medication List as of 08/06/2016  5:13 PM    START taking these medications   Details  ondansetron (ZOFRAN) 4 MG tablet Take 1 tablet (4 mg total) by mouth every 8 (eight) hours as needed for nausea or vomiting., Starting Tue 08/06/2016, Print        Follow Up: Lucianne Lei, MD [address] [place] [address] [phone]        Courtney Paris, MD 08/06/16 [phone]

## 2016-08-06 NOTE — ED Notes (Signed)
cbg was 95 

## 2016-08-07 LAB — URINE CULTURE

## 2016-08-19 DIAGNOSIS — I10 Essential (primary) hypertension: Secondary | ICD-10-CM | POA: Diagnosis not present

## 2016-08-19 DIAGNOSIS — N189 Chronic kidney disease, unspecified: Secondary | ICD-10-CM | POA: Diagnosis not present

## 2016-08-19 DIAGNOSIS — M109 Gout, unspecified: Secondary | ICD-10-CM | POA: Diagnosis not present

## 2016-09-17 DIAGNOSIS — M1000 Idiopathic gout, unspecified site: Secondary | ICD-10-CM | POA: Diagnosis not present

## 2016-09-17 DIAGNOSIS — G250 Essential tremor: Secondary | ICD-10-CM | POA: Diagnosis not present

## 2016-09-17 DIAGNOSIS — Z79899 Other long term (current) drug therapy: Secondary | ICD-10-CM | POA: Diagnosis not present

## 2016-09-17 DIAGNOSIS — E538 Deficiency of other specified B group vitamins: Secondary | ICD-10-CM | POA: Diagnosis not present

## 2016-11-20 DIAGNOSIS — I10 Essential (primary) hypertension: Secondary | ICD-10-CM | POA: Diagnosis not present

## 2016-11-20 DIAGNOSIS — Z23 Encounter for immunization: Secondary | ICD-10-CM | POA: Diagnosis not present

## 2016-11-20 DIAGNOSIS — I249 Acute ischemic heart disease, unspecified: Secondary | ICD-10-CM | POA: Diagnosis not present

## 2016-11-28 ENCOUNTER — Other Ambulatory Visit: Payer: Self-pay | Admitting: Family Medicine

## 2016-11-28 DIAGNOSIS — Z853 Personal history of malignant neoplasm of breast: Secondary | ICD-10-CM

## 2016-12-05 DIAGNOSIS — Z8619 Personal history of other infectious and parasitic diseases: Secondary | ICD-10-CM | POA: Insufficient documentation

## 2016-12-05 DIAGNOSIS — M1A39X0 Chronic gout due to renal impairment, multiple sites, without tophus (tophi): Secondary | ICD-10-CM | POA: Insufficient documentation

## 2016-12-05 DIAGNOSIS — Z87448 Personal history of other diseases of urinary system: Secondary | ICD-10-CM | POA: Insufficient documentation

## 2016-12-05 NOTE — Progress Notes (Deleted)
Office Visit Note  Patient: [name]             Date of Birth: 04/19/1932           MRN: [id number]             PCP: Elyn Peers, MD Referring: Lucianne Lei, MD Visit Date: 12/12/2016 Occupation: '@GUAROCC' @    Subjective:  No chief complaint on file.   History of Present Illness: [name] is a 83 y.o. female ***   Activities of Daily Living:  Patient reports morning stiffness for *** {minute/hour:19697}.   Patient {ACTIONS;DENIES/REPORTS:21021675::"Denies"} nocturnal pain.  Difficulty dressing/grooming: {ACTIONS;DENIES/REPORTS:21021675::"Denies"} Difficulty climbing stairs: {ACTIONS;DENIES/REPORTS:21021675::"Denies"} Difficulty getting out of chair: {ACTIONS;DENIES/REPORTS:21021675::"Denies"} Difficulty using hands for taps, buttons, cutlery, and/or writing: {ACTIONS;DENIES/REPORTS:21021675::"Denies"}   No Rheumatology ROS completed.   PMFS History:  Patient Active Problem List   Diagnosis Date Noted  . History of hepatitis 12/05/2016  . 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 History of chronic kidney disease 12/05/2016  . Pancreatitis, acute 04/23/2016  . Essential hypertension 04/23/2016  . Upper abdominal pain 04/22/2016  . Abdominal pain 04/22/2016  . Vasovagal syncope 09/19/2015  . Postprandial nausea 03/08/2015  . Syncope 03/07/2015  . Dyspnea on exertion 03/07/2015  . Blood loss anemia 04/16/2014  . Acute blood loss anemia 04/16/2014  . Melena 04/16/2014  . CKD (chronic kidney disease), stage III 03/06/2014  . Nonspecific abnormal finding in stool contents 11/06/2013  . Anemia 11/05/2013  . Renal insufficiency   . Breast cancer, left breast ([place]) 09/01/2012  . Alcohol use 07/06/2012  . Near syncope 07/06/2012  . Hepatitis   . Lipid disorder   . Anxiety   . Hypertension   . Anemia of chronic disease 04/16/2012    Past Medical History:  Diagnosis Date  . Abdominal pain 04/2016  .  Anemia of chronic disease   . Anxiety   . Breast cancer ([place]) 08/28/12   IDC left breast bx=invasive ductal ca,ER/PR=+  . Breast cancer ([place]) 09/01/2012  . Hepatitis   . Hypertension   . Lipid disorder   . RA (rheumatoid arthritis) ([place])   . Radiation 11/12/12-12/15/11   Left Breast/50 Gy  . Renal insufficiency   . Wears dentures    top  . Wears glasses     Family History  Problem Relation Age of Onset  . Cancer Father   . Prostate cancer Father   . Tuberculosis Mother   . Breast cancer Cousin    Past Surgical History:  Procedure Laterality Date  . ABDOMINAL HYSTERECTOMY      approx 20 years ago  . APPENDECTOMY     20 years ago  . bones spur     removed 20 years ago  . COLONOSCOPY  2009?  . ENTEROSCOPY N/A 11/08/2013   Procedure: ENTEROSCOPY;  Surgeon: Beryle Beams, MD;  Location: [facility] ENDOSCOPY;  Service: Endoscopy;  Laterality: N/A;  . ENTEROSCOPY N/A 04/18/2014   Procedure: ENTEROSCOPY;  Surgeon: Beryle Beams, MD;  Location: [facility] ENDOSCOPY;  Service: Endoscopy;  Laterality: N/A;  . FOOT SURGERY  1992   bone spurs both feet  . PARTIAL MASTECTOMY WITH NEEDLE LOCALIZATION AND AXILLARY SENTINEL LYMPH NODE BX  10/08/2012   Procedure: PARTIAL MASTECTOMY WITH NEEDLE LOCALIZATION AND AXILLARY SENTINEL LYMPH NODE BX;  Surgeon: Adin Hector, MD;  Location: [place];  Service: General;  Laterality: Left;  Left partial mastectomy with Needle localization and Sentinel lymph  node biospy   Social History   Social History Narrative   Widowed lives with one of her sons   3 sons and 2 daughters   Menses age 84 or 82   Age 26 with first child   No breast feeding   No HRT     Objective: Vital Signs: There were no vitals taken for this visit.   Physical Exam   Musculoskeletal Exam: ***  CDAI Exam: No CDAI exam completed.    Investigation: Findings:  Chronic renal insufficiency.  In May 2017, creatinine was 1.31 and the most recent one from September was  1.34.  GFR was 44.  AST and ALT were 46 and 37 respectively.  On Sept 2017 labs they are 28 and 23, which is a desirable range. Uric acid is 6.1  No visits with results within 3 Month(s) from this visit.  Latest known visit with results is:  Admission on 08/06/2016, Discharged on 08/06/2016  Component Date Value Ref Range Status  . Sodium 08/06/2016 134* 135 - 145 mmol/L Final  . Potassium 08/06/2016 4.6  3.5 - 5.1 mmol/L Final  . Chloride 08/06/2016 99* 101 - 111 mmol/L Final  . CO2 08/06/2016 25  22 - 32 mmol/L Final  . Glucose, Bld 08/06/2016 112* 65 - 99 mg/dL Final  . BUN 08/06/2016 9  6 - 20 mg/dL Final  . Creatinine, Ser 08/06/2016 1.32* 0.44 - 1.00 mg/dL Final  . Calcium 08/06/2016 10.1  8.9 - 10.3 mg/dL Final  . GFR calc non Af Amer 08/06/2016 36* >60 mL/min Final  . GFR calc Af Amer 08/06/2016 42* >60 mL/min Final   Comment: (NOTE) The eGFR has been calculated using the CKD EPI equation. This calculation has not been validated in all clinical situations. eGFR's persistently <60 mL/min signify possible Chronic Kidney Disease.   . Anion gap 08/06/2016 10  5 - 15 Final  . WBC 08/06/2016 5.7  4.0 - 10.5 K/uL Final  . RBC 08/06/2016 3.67* 3.87 - 5.11 MIL/uL Final  . Hemoglobin 08/06/2016 10.9* 12.0 - 15.0 g/dL Final  . HCT 08/06/2016 31.9* 36.0 - 46.0 % Final  . MCV 08/06/2016 86.9  78.0 - 100.0 fL Final  . MCH 08/06/2016 29.7  26.0 - 34.0 pg Final  . MCHC 08/06/2016 34.2  30.0 - 36.0 g/dL Final  . RDW 08/06/2016 14.5  11.5 - 15.5 % Final  . Platelets 08/06/2016 253  150 - 400 K/uL Final  . Color, Urine 08/06/2016 YELLOW  YELLOW Final  . APPearance 08/06/2016 CLEAR  CLEAR Final  . Specific Gravity, Urine 08/06/2016 1.012  1.005 - 1.030 Final  . pH 08/06/2016 7.0  5.0 - 8.0 Final  . Glucose, UA 08/06/2016 NEGATIVE  NEGATIVE mg/dL Final  . Hgb urine dipstick 08/06/2016 NEGATIVE  NEGATIVE Final  . Bilirubin Urine 08/06/2016 NEGATIVE  NEGATIVE Final  . Ketones, ur 08/06/2016  NEGATIVE  NEGATIVE mg/dL Final  . Protein, ur 08/06/2016 NEGATIVE  NEGATIVE mg/dL Final  . Nitrite 08/06/2016 NEGATIVE  NEGATIVE Final  . Leukocytes, UA 08/06/2016 TRACE* NEGATIVE Final  . Glucose-Capillary 08/06/2016 106* 65 - 99 mg/dL Final  . Total Protein 08/06/2016 7.9  6.5 - 8.1 g/dL Final  . Albumin 08/06/2016 3.5  3.5 - 5.0 g/dL Final  . AST 08/06/2016 28  15 - 41 U/L Final  . ALT 08/06/2016 19  14 - 54 U/L Final  . Alkaline Phosphatase 08/06/2016 102  38 - 126 U/L Final  . Total Bilirubin 08/06/2016 0.7  0.3 - 1.2 mg/dL Final  . Bilirubin, Direct 08/06/2016 <0.1* 0.1 - 0.5 mg/dL Final  . Indirect Bilirubin 08/06/2016 NOT CALCULATED  0.3 - 0.9 mg/dL Final  . Ammonia 08/06/2016 13  9 - 35 umol/L Final  . TSH 08/06/2016 1.784  0.350 - 4.500 uIU/mL Final  . Fecal Occult Bld 08/06/2016 NEGATIVE  NEGATIVE Final  . Troponin i, poc 08/06/2016 0.01  0.00 - 0.08 ng/mL Final  . Comment 3 08/06/2016          Final   Comment: Due to the release kinetics of cTnI, a negative result within the first hours of the onset of symptoms does not rule out myocardial infarction with certainty. If myocardial infarction is still suspected, repeat the test at appropriate intervals.   [name] Specimen Description 08/06/2016 URINE, CLEAN CATCH   Final  . Special Requests 08/06/2016 NONE   Final  . Culture 08/06/2016 MULTIPLE SPECIES PRESENT, SUGGEST RECOLLECTION*  Final  . Report Status 08/06/2016 08/07/2016 FINAL   Final  . Lactic Acid, Venous 08/06/2016 1.46  0.5 - 1.9 mmol/L Final  . Glucose-Capillary 08/06/2016 95  65 - 99 mg/dL Final  . Comment 1 08/06/2016 Notify RN   Final  . Squamous Epithelial / LPF 08/06/2016 0-5* NONE SEEN Final  . WBC, UA 08/06/2016 0-5  0 - 5 WBC/hpf Final  . RBC / HPF 08/06/2016 0-5  0 - 5 RBC/hpf Final  . Bacteria, UA 08/06/2016 RARE* NONE SEEN Final  . Lactic Acid, Venous 08/06/2016 1.49  0.5 - 1.9 mmol/L Final     Imaging: No results found.  Speciality Comments: No  specialty comments available.    Procedures:  No procedures performed Allergies: Patient has no known allergies.   Assessment / Plan:     Visit Diagnoses: Chronic gout due to renal impairment of multiple sites without tophus  History of chronic kidney disease  History of hypertension  History of hepatitis C    Orders: No orders of the defined types were placed in this encounter.  No orders of the defined types were placed in this encounter.   Face-to-face time spent with patient was *** minutes. 50% of time was spent in counseling and coordination of care.  Follow-Up Instructions: No Follow-up on file.   Satina Jerrell, RT  Note - This record has been created using [employer].  Chart creation errors have been sought, but may not always  have been located. Such creation errors do not reflect on  the standard of medical care.

## 2016-12-11 DIAGNOSIS — K740 Hepatic fibrosis: Secondary | ICD-10-CM | POA: Diagnosis not present

## 2016-12-12 ENCOUNTER — Ambulatory Visit: Payer: Self-pay | Admitting: Rheumatology

## 2016-12-12 ENCOUNTER — Other Ambulatory Visit: Payer: Self-pay | Admitting: Nurse Practitioner

## 2016-12-12 DIAGNOSIS — K7460 Unspecified cirrhosis of liver: Secondary | ICD-10-CM

## 2016-12-19 ENCOUNTER — Other Ambulatory Visit: Payer: Self-pay | Admitting: Hematology and Oncology

## 2016-12-19 DIAGNOSIS — C50212 Malignant neoplasm of upper-inner quadrant of left female breast: Secondary | ICD-10-CM

## 2017-01-09 DIAGNOSIS — M19071 Primary osteoarthritis, right ankle and foot: Secondary | ICD-10-CM | POA: Insufficient documentation

## 2017-01-09 DIAGNOSIS — M47816 Spondylosis without myelopathy or radiculopathy, lumbar region: Secondary | ICD-10-CM | POA: Insufficient documentation

## 2017-01-09 DIAGNOSIS — M19041 Primary osteoarthritis, right hand: Secondary | ICD-10-CM | POA: Insufficient documentation

## 2017-01-09 DIAGNOSIS — M19042 Primary osteoarthritis, left hand: Secondary | ICD-10-CM

## 2017-01-09 DIAGNOSIS — M19072 Primary osteoarthritis, left ankle and foot: Secondary | ICD-10-CM

## 2017-01-09 NOTE — Progress Notes (Signed)
Office Visit Note  Patient: [name]             Date of Birth: 1932/08/28           MRN: [id number]             PCP: Elyn Peers, MD Referring: Lucianne Lei, MD Visit Date: 01/16/2017 Occupation: '@GUAROCC' @    Subjective:  Right foot pain   History of Present Illness: [name] is a 83 y.o. female with history of gout with osteoarthritis. She states she had been doing well until the last week when she started having pain and discomfort in her right foot she's been having difficulty walking. She is having difficulty bearing weight on her right foot. She describes the pain in the medial aspect of her right foot. None of the other joints are painful. She denies any discomfort in her hands currently. She states she took colchicine one day last week and then stopped it. She has been taking allopurinol every day. She had a glass of beer before this flare started  Activities of Daily Living:  Patient reports morning stiffness for 0 minute.   Patient Denies nocturnal pain.  Difficulty dressing/grooming: Denies Difficulty climbing stairs: Denies Difficulty getting out of chair: Denies Difficulty using hands for taps, buttons, cutlery, and/or writing: Denies   Review of Systems  Constitutional: Negative for fatigue, night sweats, weight gain, weight loss and weakness.  HENT: Negative for mouth sores, trouble swallowing, trouble swallowing, mouth dryness and nose dryness.   Eyes: Negative for pain, redness, visual disturbance and dryness.  Respiratory: Negative for cough, shortness of breath and difficulty breathing.   Cardiovascular: Negative for chest pain, palpitations, hypertension, irregular heartbeat and swelling in legs/feet.  Gastrointestinal: Negative for blood in stool, constipation and diarrhea.  Endocrine: Negative for increased urination.  Genitourinary: Negative for vaginal dryness.  Musculoskeletal: Positive for arthralgias and joint pain. Negative  for joint swelling, myalgias, muscle weakness, morning stiffness, muscle tenderness and myalgias.  Skin: Negative for color change, rash, hair loss, skin tightness, ulcers and sensitivity to sunlight.  Allergic/Immunologic: Negative for susceptible to infections.  Neurological: Negative for dizziness, memory loss and night sweats.  Hematological: Negative for swollen glands.  Psychiatric/Behavioral: Negative for depressed mood and sleep disturbance. The patient is not nervous/anxious.     PMFS History:  Patient Active Problem List   Diagnosis Date Noted  . Primary osteoarthritis of both hands 01/09/2017  . Primary osteoarthritis of both feet 01/09/2017  . Spondylosis of lumbar region without myelopathy or radiculopathy 01/09/2017  . History of hepatitis 12/05/2016  . 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 History of chronic kidney disease 12/05/2016  . Pancreatitis, acute 04/23/2016  . Essential hypertension 04/23/2016  . Upper abdominal pain 04/22/2016  . Abdominal pain 04/22/2016  . Vasovagal syncope 09/19/2015  . Postprandial nausea 03/08/2015  . Syncope 03/07/2015  . Dyspnea on exertion 03/07/2015  . Blood loss anemia 04/16/2014  . Acute blood loss anemia 04/16/2014  . Melena 04/16/2014  . CKD (chronic kidney disease), stage III 03/06/2014  . Nonspecific abnormal finding in stool contents 11/06/2013  . Anemia 11/05/2013  . Renal insufficiency   . Breast cancer, left breast ([place]) 09/01/2012  . Alcohol use 07/06/2012  . Near syncope 07/06/2012  . Hepatitis   . Lipid disorder   . Anxiety   . Hypertension   . Anemia of chronic disease 04/16/2012    Past Medical History:  Diagnosis  Date  . Abdominal pain 04/2016  . Anemia of chronic disease   . Anxiety   . Breast cancer ([place]) 08/28/12   IDC left breast bx=invasive ductal ca,ER/PR=+  . Breast cancer ([place]) 09/01/2012  . Hepatitis   . Hypertension   . Lipid disorder   . RA  (rheumatoid arthritis) ([place])   . Radiation 11/12/12-12/15/11   Left Breast/50 Gy  . Renal insufficiency   . Wears dentures    top  . Wears glasses     Family History  Problem Relation Age of Onset  . Cancer Father   . Prostate cancer Father   . Tuberculosis Mother   . Breast cancer Cousin    Past Surgical History:  Procedure Laterality Date  . ABDOMINAL HYSTERECTOMY      approx 20 years ago  . APPENDECTOMY     20 years ago  . bones spur     removed 20 years ago  . COLONOSCOPY  2009?  . ENTEROSCOPY N/A 11/08/2013   Procedure: ENTEROSCOPY;  Surgeon: Beryle Beams, MD;  Location: [facility] ENDOSCOPY;  Service: Endoscopy;  Laterality: N/A;  . ENTEROSCOPY N/A 04/18/2014   Procedure: ENTEROSCOPY;  Surgeon: Beryle Beams, MD;  Location: [facility] ENDOSCOPY;  Service: Endoscopy;  Laterality: N/A;  . FOOT SURGERY  1992   bone spurs both feet  . PARTIAL MASTECTOMY WITH NEEDLE LOCALIZATION AND AXILLARY SENTINEL LYMPH NODE BX  10/08/2012   Procedure: PARTIAL MASTECTOMY WITH NEEDLE LOCALIZATION AND AXILLARY SENTINEL LYMPH NODE BX;  Surgeon: Adin Hector, MD;  Location: [place];  Service: General;  Laterality: Left;  Left partial mastectomy with Needle localization and Sentinel lymph node biospy   Social History   Social History Narrative   Widowed lives with one of her sons   3 sons and 2 daughters   Menses age 6 or 63   Age 78 with first child   No breast feeding   No HRT     Objective: Vital Signs: BP 138/80   Pulse 80   Resp 14   Ht '5\' 2"'  (1.575 m)   Wt 132 lb (59.9 kg)   BMI 24.14 kg/m    Physical Exam  Constitutional: She is oriented to person, place, and time. She appears well-developed and well-nourished.  HENT:  Head: Normocephalic and atraumatic.  Eyes: Conjunctivae and EOM are normal.  Neck: Normal range of motion.  Cardiovascular: Normal rate, regular rhythm, normal heart sounds and intact distal pulses.   Pulmonary/Chest: Effort normal and breath  sounds normal.  Abdominal: Soft. Bowel sounds are normal.  Lymphadenopathy:    She has no cervical adenopathy.  Neurological: She is alert and oriented to person, place, and time.  Skin: Skin is warm and dry. Capillary refill takes less than 2 seconds.  Psychiatric: She has a normal mood and affect. Her behavior is normal.  Nursing note and vitals reviewed.    Musculoskeletal Exam: C-spine and thoracic lumbar spine good range of motion. Shoulder joints elbow joints wrist joints are good range of motion. She has DIP PIP thickening bilaterally consistent with osteoarthritis. Hip joints knee joints are good range of motion. She had tenderness over medial aspect of her right foot. No swelling was noted. She does have overcrowding of her toes.  CDAI Exam: No CDAI exam completed.    Investigation: Findings:  Labs from July 20, 2016, ordered by Dr. Beverley Fiedler ([place] on [address]).  CBC shows a hemoglobin of 10.7.  Liver function  test panel was normal.  Creatinine was elevated at 1.34.  Uric Acid 6.1  Labs from 03/15/2016 show CMP with GFR is normal except for creatinine elevated at 1.31 and GFR low at 44.  AST is 46.  ALT is 37.   Note that from 02/09/2016 CMP showed elevated creatinine at 1.37, GFR low at 42.  CBC with diff is normal.  Uric acid was 5.1.  Rheumatoid factor was positive at 223.  CCP was negative at 16.   Labs from 01/08/2016 show CMP with GFR is normal except elevated creatinine at 1.41, decreased GFR at 40.  CBC with diff is normal.  Uric acid was elevated at 7.9.   No visits with results within 4 Month(s) from this visit.  Latest known visit with results is:  Admission on 08/06/2016, Discharged on 08/06/2016  Component Date Value Ref Range Status  . Sodium 08/06/2016 134* 135 - 145 mmol/L Final  . Potassium 08/06/2016 4.6  3.5 - 5.1 mmol/L Final  . Chloride 08/06/2016 99* 101 - 111 mmol/L Final  . CO2 08/06/2016 25  22 - 32 mmol/L Final    . Glucose, Bld 08/06/2016 112* 65 - 99 mg/dL Final  . BUN 08/06/2016 9  6 - 20 mg/dL Final  . Creatinine, Ser 08/06/2016 1.32* 0.44 - 1.00 mg/dL Final  . Calcium 08/06/2016 10.1  8.9 - 10.3 mg/dL Final  . GFR calc non Af Amer 08/06/2016 36* >60 mL/min Final  . GFR calc Af Amer 08/06/2016 42* >60 mL/min Final   Comment: (NOTE) The eGFR has been calculated using the CKD EPI equation. This calculation has not been validated in all clinical situations. eGFR's persistently <60 mL/min signify possible Chronic Kidney Disease.   . Anion gap 08/06/2016 10  5 - 15 Final  . WBC 08/06/2016 5.7  4.0 - 10.5 K/uL Final  . RBC 08/06/2016 3.67* 3.87 - 5.11 MIL/uL Final  . Hemoglobin 08/06/2016 10.9* 12.0 - 15.0 g/dL Final  . HCT 08/06/2016 31.9* 36.0 - 46.0 % Final  . MCV 08/06/2016 86.9  78.0 - 100.0 fL Final  . MCH 08/06/2016 29.7  26.0 - 34.0 pg Final  . MCHC 08/06/2016 34.2  30.0 - 36.0 g/dL Final  . RDW 08/06/2016 14.5  11.5 - 15.5 % Final  . Platelets 08/06/2016 253  150 - 400 K/uL Final  . Color, Urine 08/06/2016 YELLOW  YELLOW Final  . APPearance 08/06/2016 CLEAR  CLEAR Final  . Specific Gravity, Urine 08/06/2016 1.012  1.005 - 1.030 Final  . pH 08/06/2016 7.0  5.0 - 8.0 Final  . Glucose, UA 08/06/2016 NEGATIVE  NEGATIVE mg/dL Final  . Hgb urine dipstick 08/06/2016 NEGATIVE  NEGATIVE Final  . Bilirubin Urine 08/06/2016 NEGATIVE  NEGATIVE Final  . Ketones, ur 08/06/2016 NEGATIVE  NEGATIVE mg/dL Final  . Protein, ur 08/06/2016 NEGATIVE  NEGATIVE mg/dL Final  . Nitrite 08/06/2016 NEGATIVE  NEGATIVE Final  . Leukocytes, UA 08/06/2016 TRACE* NEGATIVE Final  . Glucose-Capillary 08/06/2016 106* 65 - 99 mg/dL Final  . Total Protein 08/06/2016 7.9  6.5 - 8.1 g/dL Final  . Albumin 08/06/2016 3.5  3.5 - 5.0 g/dL Final  . AST 08/06/2016 28  15 - 41 U/L Final  . ALT 08/06/2016 19  14 - 54 U/L Final  . Alkaline Phosphatase 08/06/2016 102  38 - 126 U/L Final  . Total Bilirubin 08/06/2016 0.7  0.3 -  1.2 mg/dL Final  . Bilirubin, Direct 08/06/2016 <0.1* 0.1 - 0.5 mg/dL Final  . Indirect Bilirubin  08/06/2016 NOT CALCULATED  0.3 - 0.9 mg/dL Final  . Ammonia 08/06/2016 13  9 - 35 umol/L Final  . TSH 08/06/2016 1.784  0.350 - 4.500 uIU/mL Final  . Fecal Occult Bld 08/06/2016 NEGATIVE  NEGATIVE Final  . Troponin i, poc 08/06/2016 0.01  0.00 - 0.08 ng/mL Final  . Comment 3 08/06/2016          Final   Comment: Due to the release kinetics of cTnI, a negative result within the first hours of the onset of symptoms does not rule out myocardial infarction with certainty. If myocardial infarction is still suspected, repeat the test at appropriate intervals.   [name] Specimen Description 08/06/2016 URINE, CLEAN CATCH   Final  . Special Requests 08/06/2016 NONE   Final  . Culture 08/06/2016 MULTIPLE SPECIES PRESENT, SUGGEST RECOLLECTION*  Final  . Report Status 08/06/2016 08/07/2016 FINAL   Final  . Lactic Acid, Venous 08/06/2016 1.46  0.5 - 1.9 mmol/L Final  . Glucose-Capillary 08/06/2016 95  65 - 99 mg/dL Final  . Comment 1 08/06/2016 Notify RN   Final  . Squamous Epithelial / LPF 08/06/2016 0-5* NONE SEEN Final  . WBC, UA 08/06/2016 0-5  0 - 5 WBC/hpf Final  . RBC / HPF 08/06/2016 0-5  0 - 5 RBC/hpf Final  . Bacteria, UA 08/06/2016 RARE* NONE SEEN Final  . Lactic Acid, Venous 08/06/2016 1.49  0.5 - 1.9 mmol/L Final     Imaging: No results found.  Speciality Comments: No specialty comments available.    Procedures:  No procedures performed Allergies: Patient has no known allergies.   Assessment / Plan:     Visit Diagnoses: Pain in right foot - Plan: XR Foot 2 Views Right. It did not show any fracture. I believe patient probably had a flare of gout for which she's taken colchicine intermittently. We discussed taking colchicine twice a day for to 3 days and then take it once a day and when necessary later on. If she continues to have discomfort in her right foot she supposed to notify us  and we can have repeat x-ray in couple of weeks. I explained to her that sometimes acute fracture is not visible on initial x-ray.  Chronic gout due to renal impairment of multiple sites without tophus - Plan: CBC with Differential/Platelet, COMPLETE METABOLIC PANEL WITH GFR, Uric acid  Primary osteoarthritis of both hands: Minimal stiffness  Primary osteoarthritis of both feet: She has some stiffness in bilateral feet.  Spondylosis of lumbar region without myelopathy or radiculopathy, minimal discomfort  Her other medical problems are listed as follows:  History of chronic kidney disease  History of hepatitis  History of breast cancer - Status post left lumpectomy  History of pancreatitis  History of hypertension  Elevated LFTs  History of hepatitis C    Orders: Orders Placed This Encounter  Procedures  . XR Foot 2 Views Right  . CBC with Differential/Platelet  . COMPLETE METABOLIC PANEL WITH GFR  . Uric acid   No orders of the defined types were placed in this encounter.   Face-to-face time spent with patient was 30 minutes. 50% of time was spent in counseling and coordination of care.  Follow-Up Instructions: Return for Gout.   Bo Merino, MD  Note - This record has been created using [employer].  Chart creation errors have been sought, but may not always  have been located. Such creation errors do not reflect on  the standard of medical care.

## 2017-01-16 ENCOUNTER — Ambulatory Visit (INDEPENDENT_AMBULATORY_CARE_PROVIDER_SITE_OTHER): Payer: Medicare Other

## 2017-01-16 ENCOUNTER — Encounter: Payer: Self-pay | Admitting: Rheumatology

## 2017-01-16 ENCOUNTER — Ambulatory Visit (INDEPENDENT_AMBULATORY_CARE_PROVIDER_SITE_OTHER): Payer: Medicare Other | Admitting: Rheumatology

## 2017-01-16 VITALS — BP 138/80 | HR 80 | Resp 14 | Ht 62.0 in | Wt 132.0 lb

## 2017-01-16 DIAGNOSIS — M19041 Primary osteoarthritis, right hand: Secondary | ICD-10-CM | POA: Diagnosis not present

## 2017-01-16 DIAGNOSIS — R945 Abnormal results of liver function studies: Secondary | ICD-10-CM

## 2017-01-16 DIAGNOSIS — Z853 Personal history of malignant neoplasm of breast: Secondary | ICD-10-CM | POA: Diagnosis not present

## 2017-01-16 DIAGNOSIS — Z8719 Personal history of other diseases of the digestive system: Secondary | ICD-10-CM

## 2017-01-16 DIAGNOSIS — M19071 Primary osteoarthritis, right ankle and foot: Secondary | ICD-10-CM | POA: Diagnosis not present

## 2017-01-16 DIAGNOSIS — M47816 Spondylosis without myelopathy or radiculopathy, lumbar region: Secondary | ICD-10-CM | POA: Diagnosis not present

## 2017-01-16 DIAGNOSIS — M79671 Pain in right foot: Secondary | ICD-10-CM

## 2017-01-16 DIAGNOSIS — R7989 Other specified abnormal findings of blood chemistry: Secondary | ICD-10-CM | POA: Diagnosis not present

## 2017-01-16 DIAGNOSIS — M1A39X0 Chronic gout due to renal impairment, multiple sites, without tophus (tophi): Secondary | ICD-10-CM

## 2017-01-16 DIAGNOSIS — Z8619 Personal history of other infectious and parasitic diseases: Secondary | ICD-10-CM | POA: Diagnosis not present

## 2017-01-16 DIAGNOSIS — Z87448 Personal history of other diseases of urinary system: Secondary | ICD-10-CM

## 2017-01-16 DIAGNOSIS — M19072 Primary osteoarthritis, left ankle and foot: Secondary | ICD-10-CM

## 2017-01-16 DIAGNOSIS — M19042 Primary osteoarthritis, left hand: Secondary | ICD-10-CM | POA: Diagnosis not present

## 2017-01-16 DIAGNOSIS — Z8679 Personal history of other diseases of the circulatory system: Secondary | ICD-10-CM

## 2017-01-16 LAB — CBC WITH DIFFERENTIAL/PLATELET
BASOS ABS: 0 {cells}/uL (ref 0–200)
Basophils Relative: 0 %
EOS ABS: 76 {cells}/uL (ref 15–500)
Eosinophils Relative: 1 %
HEMATOCRIT: 35.7 % (ref 35.0–45.0)
HEMOGLOBIN: 12 g/dL (ref 11.7–15.5)
LYMPHS PCT: 18 %
Lymphs Abs: 1368 cells/uL (ref 850–3900)
MCH: 30.8 pg (ref 27.0–33.0)
MCHC: 33.6 g/dL (ref 32.0–36.0)
MCV: 91.5 fL (ref 80.0–100.0)
MONO ABS: 760 {cells}/uL (ref 200–950)
MPV: 10.1 fL (ref 7.5–12.5)
Monocytes Relative: 10 %
NEUTROS PCT: 71 %
Neutro Abs: 5396 cells/uL (ref 1500–7800)
Platelets: 260 10*3/uL (ref 140–400)
RBC: 3.9 MIL/uL (ref 3.80–5.10)
RDW: 16.4 % — AB (ref 11.0–15.0)
WBC: 7.6 10*3/uL (ref 3.8–10.8)

## 2017-01-16 LAB — COMPLETE METABOLIC PANEL WITH GFR
ALBUMIN: 4 g/dL (ref 3.6–5.1)
ALK PHOS: 84 U/L (ref 33–130)
ALT: 18 U/L (ref 6–29)
AST: 27 U/L (ref 10–35)
BUN: 24 mg/dL (ref 7–25)
CALCIUM: 10 mg/dL (ref 8.6–10.4)
CHLORIDE: 103 mmol/L (ref 98–110)
CO2: 23 mmol/L (ref 20–31)
CREATININE: 1.43 mg/dL — AB (ref 0.60–0.88)
GFR, EST AFRICAN AMERICAN: 39 mL/min — AB (ref >=60)
GFR, Est Non African American: 34 mL/min — ABNORMAL LOW (ref >=60)
Glucose, Bld: 79 mg/dL (ref 65–99)
POTASSIUM: 4.5 mmol/L (ref 3.5–5.3)
Sodium: 138 mmol/L (ref 135–146)
Total Bilirubin: 0.3 mg/dL (ref 0.2–1.2)
Total Protein: 8.2 g/dL — ABNORMAL HIGH (ref 6.1–8.1)

## 2017-01-17 LAB — URIC ACID: URIC ACID, SERUM: 4.8 mg/dL (ref 2.5–7.0)

## 2017-01-18 NOTE — Progress Notes (Signed)
Creatinine is elevated. Please fax to PCP and notify patient to follow-up with her PCP

## 2017-01-23 ENCOUNTER — Other Ambulatory Visit: Payer: Self-pay | Admitting: Hematology and Oncology

## 2017-01-23 DIAGNOSIS — C50212 Malignant neoplasm of upper-inner quadrant of left female breast: Secondary | ICD-10-CM

## 2017-01-23 DIAGNOSIS — C50912 Malignant neoplasm of unspecified site of left female breast: Secondary | ICD-10-CM

## 2017-01-23 DIAGNOSIS — R110 Nausea: Secondary | ICD-10-CM

## 2017-01-28 ENCOUNTER — Ambulatory Visit
Admission: RE | Admit: 2017-01-28 | Discharge: 2017-01-28 | Disposition: A | Discharge status: Home / Self Care | Payer: Medicare Other | Source: Ambulatory Visit | Attending: Nurse Practitioner | Admitting: Nurse Practitioner

## 2017-01-28 DIAGNOSIS — K760 Fatty (change of) liver, not elsewhere classified: Secondary | ICD-10-CM | POA: Diagnosis not present

## 2017-01-28 DIAGNOSIS — K7460 Unspecified cirrhosis of liver: Secondary | ICD-10-CM

## 2017-02-03 ENCOUNTER — Other Ambulatory Visit: Payer: Self-pay | Admitting: *Deleted

## 2017-02-03 NOTE — Telephone Encounter (Signed)
ok 

## 2017-02-03 NOTE — Telephone Encounter (Signed)
Refill request receive via fax  Last Visit: 01/16/17 Next Visit: 07/23/17 Labs: 01/16/17 Creatine elevated 1.43 previously 1.32  Okay to refill Allopurinol?

## 2017-02-04 MED ORDER — ALLOPURINOL 100 MG PO TABS: 100.0000 mg | ORAL_TABLET | Freq: Every day | ORAL | 2 refills | Status: DC

## 2017-02-06 ENCOUNTER — Ambulatory Visit
Admission: RE | Admit: 2017-02-06 | Discharge: 2017-02-06 | Disposition: A | Discharge status: Home / Self Care | Payer: Medicare Other | Source: Ambulatory Visit | Attending: Family Medicine | Admitting: Family Medicine

## 2017-02-06 DIAGNOSIS — Z853 Personal history of malignant neoplasm of breast: Secondary | ICD-10-CM

## 2017-02-06 DIAGNOSIS — R928 Other abnormal and inconclusive findings on diagnostic imaging of breast: Secondary | ICD-10-CM | POA: Diagnosis not present

## 2017-02-07 ENCOUNTER — Encounter (HOSPITAL_COMMUNITY): Payer: Self-pay | Admitting: *Deleted

## 2017-02-07 ENCOUNTER — Emergency Department (HOSPITAL_COMMUNITY)
Admission: EM | Admit: 2017-02-07 | Discharge: 2017-02-07 | Disposition: A | Discharge status: Home / Self Care | Payer: Medicare Other | Attending: Emergency Medicine | Admitting: Emergency Medicine

## 2017-02-07 ENCOUNTER — Emergency Department (HOSPITAL_COMMUNITY): Payer: Medicare Other

## 2017-02-07 DIAGNOSIS — N183 Chronic kidney disease, stage 3 (moderate): Secondary | ICD-10-CM | POA: Insufficient documentation

## 2017-02-07 DIAGNOSIS — Z853 Personal history of malignant neoplasm of breast: Secondary | ICD-10-CM | POA: Diagnosis not present

## 2017-02-07 DIAGNOSIS — Z79899 Other long term (current) drug therapy: Secondary | ICD-10-CM | POA: Insufficient documentation

## 2017-02-07 DIAGNOSIS — Z87891 Personal history of nicotine dependence: Secondary | ICD-10-CM | POA: Diagnosis not present

## 2017-02-07 DIAGNOSIS — R1031 Right lower quadrant pain: Secondary | ICD-10-CM | POA: Diagnosis not present

## 2017-02-07 DIAGNOSIS — I129 Hypertensive chronic kidney disease with stage 1 through stage 4 chronic kidney disease, or unspecified chronic kidney disease: Secondary | ICD-10-CM | POA: Insufficient documentation

## 2017-02-07 DIAGNOSIS — R109 Unspecified abdominal pain: Secondary | ICD-10-CM | POA: Diagnosis not present

## 2017-02-07 LAB — URINALYSIS, ROUTINE W REFLEX MICROSCOPIC
BILIRUBIN URINE: NEGATIVE
GLUCOSE, UA: NEGATIVE mg/dL
Hgb urine dipstick: NEGATIVE
Ketones, ur: NEGATIVE mg/dL
Leukocytes, UA: NEGATIVE
Nitrite: NEGATIVE
PH: 5 (ref 5.0–8.0)
Protein, ur: NEGATIVE mg/dL
SPECIFIC GRAVITY, URINE: 1.012 (ref 1.005–1.030)

## 2017-02-07 LAB — COMPREHENSIVE METABOLIC PANEL
ALBUMIN: 3.4 g/dL — AB (ref 3.5–5.0)
ALK PHOS: 64 U/L (ref 38–126)
ALT: 22 U/L (ref 14–54)
ANION GAP: 10 (ref 5–15)
AST: 29 U/L (ref 15–41)
BUN: 39 mg/dL — ABNORMAL HIGH (ref 6–20)
CALCIUM: 9.4 mg/dL (ref 8.9–10.3)
CO2: 23 mmol/L (ref 22–32)
Chloride: 101 mmol/L (ref 101–111)
Creatinine, Ser: 1.87 mg/dL — ABNORMAL HIGH (ref 0.44–1.00)
GFR calc Af Amer: 28 mL/min — ABNORMAL LOW (ref >60)
GFR calc non Af Amer: 24 mL/min — ABNORMAL LOW (ref >60)
Glucose, Bld: 112 mg/dL — ABNORMAL HIGH (ref 65–99)
Potassium: 4.4 mmol/L (ref 3.5–5.1)
Sodium: 134 mmol/L — ABNORMAL LOW (ref 135–145)
Total Bilirubin: 0.7 mg/dL (ref 0.3–1.2)
Total Protein: 7.3 g/dL (ref 6.5–8.1)

## 2017-02-07 LAB — CBC
HCT: 33.1 % — ABNORMAL LOW (ref 36.0–46.0)
HEMOGLOBIN: 11.1 g/dL — AB (ref 12.0–15.0)
MCH: 30.8 pg (ref 26.0–34.0)
MCHC: 33.5 g/dL (ref 30.0–36.0)
MCV: 91.9 fL (ref 78.0–100.0)
Platelets: 186 10*3/uL (ref 150–400)
RBC: 3.6 MIL/uL — ABNORMAL LOW (ref 3.87–5.11)
RDW: 15.2 % (ref 11.5–15.5)
WBC: 6.2 10*3/uL (ref 4.0–10.5)

## 2017-02-07 LAB — LIPASE, BLOOD: Lipase: 85 U/L — ABNORMAL HIGH (ref 11–51)

## 2017-02-07 MED ORDER — ACETAMINOPHEN 500 MG PO TABS
1000.0000 mg | ORAL_TABLET | Freq: Once | ORAL | Status: AC
  Administered 2017-02-07: 1000 mg via ORAL
  Filled 2017-02-07: qty 2

## 2017-02-07 MED ORDER — DICYCLOMINE HCL 20 MG PO TABS: 20.0000 mg | ORAL_TABLET | Freq: Two times a day (BID) | ORAL | 0 refills | Status: DC

## 2017-02-07 MED ORDER — MORPHINE SULFATE (PF) 4 MG/ML IV SOLN
4.0000 mg | Freq: Once | INTRAVENOUS | Status: AC
  Administered 2017-02-07: 4 mg via INTRAVENOUS
  Filled 2017-02-07: qty 1

## 2017-02-07 MED ORDER — ONDANSETRON HCL 4 MG/2ML IJ SOLN
4.0000 mg | Freq: Once | INTRAMUSCULAR | Status: AC
  Administered 2017-02-07: 4 mg via INTRAVENOUS
  Filled 2017-02-07: qty 2

## 2017-02-07 MED ORDER — DICYCLOMINE HCL 10 MG PO CAPS
10.0000 mg | ORAL_CAPSULE | Freq: Once | ORAL | Status: AC
  Administered 2017-02-07: 10 mg via ORAL
  Filled 2017-02-07: qty 1

## 2017-02-07 NOTE — ED Notes (Signed)
ED Provider at bedside. 

## 2017-02-07 NOTE — ED Triage Notes (Signed)
Pt states RLQ pain since yesterday.  Denies changes in bowel or bladder habits, but states nausea.

## 2017-02-07 NOTE — Discharge Instructions (Signed)

## 2017-02-07 NOTE — ED Provider Notes (Addendum)
[facility] DEPT Provider Note   CSN: [id number] Arrival date & time: 02/07/17  0803     History   Chief Complaint Chief Complaint  Patient presents with  . Abdominal Pain  . Nausea    HPI [name] is a 83 y.o. female    History of Present Illness  Patient Identification [name] is a 83 y.o. female.  Chief Complaint  Abdominal Pain and Nausea   Patient presents for evaluation of abdominal pain. Onset of symptoms was abrupt starting 1 day ago with gradually worsening course since that time. The pain is located RLQ without radiation. The pain is rated as moderate and persistent. The pain is made worse by nothing and is relieved by nothing. The patient also complains of nausea and decreased appetite as well as increased urinary and stool frequency- no melena or hematochezia. The patient denies anorexia, arthralgias, belching, constipation, diarrhea, fever and headache.  She has a pmh of Hep C treated with Harvoni last year and pancreatitis. The patient denies PUD, GERD, Crohn's disease, ulcerative colitis, irritable bowel. Home care consisted of Reglan with no relief. Currently on tx for breast cancer. PSHX of  hysterectomy and appendectomy      Past Medical History:  Diagnosis Date  . Abdominal pain 04/2016  . Anemia of chronic disease   . Anxiety   . Breast cancer ([place]) 08/28/12   IDC left breast bx=invasive ductal ca,ER/PR=+  . Breast cancer ([place]) 09/01/2012  . Hepatitis   . Hypertension   . Lipid disorder   . RA (rheumatoid arthritis) ([place])   . Radiation 11/12/12-12/15/11   Left Breast/50 Gy  . Renal insufficiency   . Wears dentures    top  . Wears glasses     Patient Active Problem List   Diagnosis Date Noted  . Primary osteoarthritis of both hands 01/09/2017  . Primary osteoarthritis of both feet 01/09/2017  . Spondylosis of lumbar region without myelopathy or radiculopathy 01/09/2017  . History of hepatitis 12/05/2016  .  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 History of chronic kidney disease 12/05/2016  . Pancreatitis, acute 04/23/2016  . Essential hypertension 04/23/2016  . Upper abdominal pain 04/22/2016  . Abdominal pain 04/22/2016  . Vasovagal syncope 09/19/2015  . Postprandial nausea 03/08/2015  . Syncope 03/07/2015  . Dyspnea on exertion 03/07/2015  . Blood loss anemia 04/16/2014  . Acute blood loss anemia 04/16/2014  . Melena 04/16/2014  . CKD (chronic kidney disease), stage III 03/06/2014  . Nonspecific abnormal finding in stool contents 11/06/2013  . Anemia 11/05/2013  . Renal insufficiency   . Breast cancer, left breast ([place]) 09/01/2012  . Alcohol use 07/06/2012  . Near syncope 07/06/2012  . Hepatitis   . Lipid disorder   . Anxiety   . Hypertension   . Anemia of chronic disease 04/16/2012    Past Surgical History:  Procedure Laterality Date  . ABDOMINAL HYSTERECTOMY      approx 20 years ago  . APPENDECTOMY     20 years ago  . bones spur     removed 20 years ago  . COLONOSCOPY  2009?  . ENTEROSCOPY N/A 11/08/2013   Procedure: ENTEROSCOPY;  Surgeon: Beryle Beams, MD;  Location: [facility] ENDOSCOPY;  Service: Endoscopy;  Laterality: N/A;  . ENTEROSCOPY N/A 04/18/2014   Procedure: ENTEROSCOPY;  Surgeon: Beryle Beams, MD;  Location: [facility] ENDOSCOPY;  Service: Endoscopy;  Laterality: N/A;  . [place]  bone spurs both feet  . PARTIAL MASTECTOMY WITH NEEDLE LOCALIZATION AND AXILLARY SENTINEL LYMPH NODE BX  10/08/2012   Procedure: PARTIAL MASTECTOMY WITH NEEDLE LOCALIZATION AND AXILLARY SENTINEL LYMPH NODE BX;  Surgeon: Adin Hector, MD;  Location: [facility];  Service: General;  Laterality: Left;  Left partial mastectomy with Needle localization and Sentinel lymph node biospy    OB History    No data available       Home Medications    Prior to Admission medications   Medication Sig Start Date End Date Taking?  Authorizing Provider  allopurinol (ZYLOPRIM) 100 MG tablet Take 1 tablet (100 mg total) by mouth daily. 02/04/17   Bo Merino, MD  Amlodipine-Valsartan-HCTZ (EXFORGE HCT) 5-160-12.5 MG TABS Take 1 tablet by mouth daily.    Historical Provider, MD  anastrozole (ARIMIDEX) 1 MG tablet TAKE 1 TABLET(1 MG) BY MOUTH DAILY 01/23/17   Nicholas Lose, MD  calcium carbonate (OS-CAL) 600 MG TABS Take 600 mg by mouth daily.    Historical Provider, MD  Cholecalciferol (VITAMIN D-3) 1000 UNITS CAPS Take 1,000 Units by mouth daily.    Historical Provider, MD  colchicine 0.6 MG tablet Take 0.6 mg by mouth daily as needed. Gout flare up 07/19/16   Historical Provider, MD  fluticasone (FLONASE) 50 MCG/ACT nasal spray Place 2 sprays into both nostrils daily as needed for allergies or rhinitis. Patient not taking: Reported on 01/16/2017 11/08/13   Ripudeep Krystal Eaton, MD  folic acid (FOLVITE) 381 MCG tablet Take 400-800 mcg by mouth daily.    Historical Provider, MD  metoCLOPramide (REGLAN) 5 MG tablet TAKE 1 TABLET THREE TIMES DAILY AS NEEDED FOR NAUSEA AND VOMITING. TAKE 15-20 MINS BEFORE A MEAL 01/23/17   Nicholas Lose, MD  Omega-3 Fatty Acids (FISH OIL) 500 MG CAPS Take 1 capsule by mouth daily.    Historical Provider, MD  omeprazole (PRILOSEC) 20 MG capsule Take 20 mg by mouth daily. 05/09/15   Historical Provider, MD  ondansetron (ZOFRAN) 4 MG tablet Take 1 tablet (4 mg total) by mouth every 8 (eight) hours as needed for nausea or vomiting. Patient not taking: Reported on 01/16/2017 08/06/16   Gwenyth Allegra Tegeler, MD  ondansetron (ZOFRAN-ODT) 4 MG disintegrating tablet Take 1 tablet (4 mg total) by mouth every 8 (eight) hours as needed for nausea or vomiting. Patient not taking: Reported on 08/06/2016 04/22/16   Davonna Belling, MD  vitamin C (ASCORBIC ACID) 500 MG tablet Take 500 mg by mouth daily.    Historical Provider, MD    Family History Family History  Problem Relation Age of Onset  . Cancer Father   .  Prostate cancer Father   . Tuberculosis Mother   . Breast cancer Cousin     Social History Social History  Substance Use Topics  . Smoking status: Former Smoker    Packs/day: 1.00    Types: Cigarettes    Quit date: 10/27/2007  . Smokeless tobacco: Never Used     Comment: started smoking age 41  . Alcohol use 0.6 oz/week    1 Glasses of wine per week     Comment:  glass wine per week     Allergies   Patient has no known allergies.   Review of Systems Review of Systems Ten systems reviewed and are negative for acute change, except as noted in the HPI.    Physical Exam Updated Vital Signs BP (!) 106/45   Pulse (!) 51   Temp 97.6 F (  36.4 C) (Oral)   Resp 16   Ht 5\' 2"  (1.575 m)   Wt 61.7 kg   SpO2 99%   BMI 24.87 kg/m   Physical Exam  Constitutional: She is oriented to person, place, and time. She appears well-developed and well-nourished. No distress.  HENT:  Head: Normocephalic and atraumatic.  Eyes: Conjunctivae are normal. No scleral icterus.  Neck: Normal range of motion.  Cardiovascular: Normal rate, regular rhythm and normal heart sounds.  Exam reveals no gallop and no friction rub.   No murmur heard. Pulmonary/Chest: Effort normal and breath sounds normal. No respiratory distress.  Abdominal: Soft. Bowel sounds are normal. She exhibits no distension and no mass. There is tenderness (mild tenderness in the RLQ). There is no guarding.  Neurological: She is alert and oriented to person, place, and time.  Skin: Skin is warm and dry. She is not diaphoretic.  Psychiatric: Her behavior is normal.  Nursing note and vitals reviewed.    ED Treatments / Results  Labs (all labs ordered are listed, but only abnormal results are displayed) Labs Reviewed  LIPASE, BLOOD  COMPREHENSIVE METABOLIC PANEL  CBC  URINALYSIS, ROUTINE W REFLEX MICROSCOPIC    EKG  EKG Interpretation None       Radiology Mm Diag Breast Tomo Bilateral  Result Date:  02/06/2017 CLINICAL DATA:  History of treated left breast cancer, status post lumpectomy and radiation therapy in 2014. EXAM: 2D DIGITAL DIAGNOSTIC BILATERAL MAMMOGRAM WITH CAD AND ADJUNCT TOMO COMPARISON:  Previous exam(s). ACR Breast Density Category b: There are scattered areas of fibroglandular density. FINDINGS: Mammographically, there are no suspicious masses, areas of nonsurgical architectural distortion or microcalcifications in either breast. Stable post lumpectomy and posttreatment changes in the left breast are noted. Mammographic images were processed with CAD. IMPRESSION: No mammographic evidence of malignancy in either breast, status post left lumpectomy. RECOMMENDATION: Diagnostic mammogram is suggested in 1 year. (Code:DM-B-01Y) I have discussed the findings and recommendations with the patient. Results were also provided in writing at the conclusion of the visit. If applicable, a reminder letter will be sent to the patient regarding the next appointment. BI-RADS CATEGORY  2: Benign. Electronically Signed   By: Fidela Salisbury M.D.   On: 02/06/2017 10:35    Procedures Procedures (including critical care time)  Medications Ordered in ED Medications - No data to display   Initial Impression / Assessment and Plan / ED Course  I have reviewed the triage vital signs and the nursing notes.  Pertinent labs & imaging results that were available during my care of the patient were reviewed by me and considered in my medical decision making (see chart for details).     Patient was slightly elevated pancreatic enzymes. She is not actively vomiting and has a benign abdominal exam her CT scan is negative, no elevated white blood cell count. Her electrolytes are otherwise without significant abnormality. Her creatinine is elevated but appears to be at baseline with some renal insufficiency. The patient appears safe for discharge at this time. Will discharge with parental., Zofran and follow up  with PCP. Discussed patient's initial visit with Dr. Maryan Rued who agrees with workup and plan of care for discharge  Final Clinical Impressions(s) / ED Diagnoses   Final diagnoses:  Right lower quadrant abdominal pain    New Prescriptions New Prescriptions   No medications on file     Margarita Mail, PA-C 02/07/17 1640    Blanchie Dessert, MD 02/09/17 2213    Margarita Mail, PA-C  04/16/17 [place], MD 04/24/17 [phone]

## 2017-03-20 DIAGNOSIS — I10 Essential (primary) hypertension: Secondary | ICD-10-CM | POA: Diagnosis not present

## 2017-03-20 DIAGNOSIS — D6489 Other specified anemias: Secondary | ICD-10-CM | POA: Diagnosis not present

## 2017-03-20 DIAGNOSIS — I749 Embolism and thrombosis of unspecified artery: Secondary | ICD-10-CM | POA: Diagnosis not present

## 2017-03-20 DIAGNOSIS — D649 Anemia, unspecified: Secondary | ICD-10-CM | POA: Diagnosis not present

## 2017-03-24 ENCOUNTER — Other Ambulatory Visit: Payer: Self-pay | Admitting: Hematology and Oncology

## 2017-03-24 DIAGNOSIS — C50212 Malignant neoplasm of upper-inner quadrant of left female breast: Secondary | ICD-10-CM

## 2017-04-24 ENCOUNTER — Other Ambulatory Visit: Payer: Self-pay | Admitting: Hematology and Oncology

## 2017-04-24 DIAGNOSIS — C50912 Malignant neoplasm of unspecified site of left female breast: Secondary | ICD-10-CM

## 2017-04-24 DIAGNOSIS — R110 Nausea: Secondary | ICD-10-CM

## 2017-04-30 ENCOUNTER — Telehealth (INDEPENDENT_AMBULATORY_CARE_PROVIDER_SITE_OTHER): Payer: Self-pay

## 2017-04-30 NOTE — Telephone Encounter (Signed)
Patient would like a Rx refill on Allopurinol.  CB# is [phone].  Please advise.  Thank You.

## 2017-05-01 MED ORDER — ALLOPURINOL 100 MG PO TABS: 100.0000 mg | ORAL_TABLET | Freq: Every day | ORAL | 0 refills | Status: DC

## 2017-05-01 NOTE — Addendum Note (Signed)
Addended byCandice Camp on: 05/01/2017 08:47 AM   Modules accepted: Orders

## 2017-05-01 NOTE — Telephone Encounter (Signed)
01/16/17 last visit  07/23/17 next visit   CBC Latest Ref Rng & Units 02/07/2017 01/16/2017 08/06/2016  WBC 4.0 - 10.5 K/uL 6.2 7.6 5.7  Hemoglobin 12.0 - 15.0 g/dL 11.1(L) 12.0 10.9(L)  Hematocrit 36.0 - 46.0 % 33.1(L) 35.7 31.9(L)  Platelets 150 - 400 K/uL 186 260 253   CMP Latest Ref Rng & Units 02/07/2017 01/16/2017 08/06/2016  Glucose 65 - 99 mg/dL 112(H) 79 112(H)  BUN 6 - 20 mg/dL 39(H) 24 9  Creatinine 0.44 - 1.00 mg/dL 1.87(H) 1.43(H) 1.32(H)  Sodium 135 - 145 mmol/L 134(L) 138 134(L)  Potassium 3.5 - 5.1 mmol/L 4.4 4.5 4.6  Chloride 101 - 111 mmol/L 101 103 99(L)  CO2 22 - 32 mmol/L 23 23 25   Calcium 8.9 - 10.3 mg/dL 9.4 10.0 10.1  Total Protein 6.5 - 8.1 g/dL 7.3 8.2(H) 7.9  Total Bilirubin 0.3 - 1.2 mg/dL 0.7 0.3 0.7  Alkaline Phos 38 - 126 U/L 64 84 102  AST 15 - 41 U/L 29 27 28   ALT 14 - 54 U/L 22 18 19    Ok to refill per Dr Estanislado Pandy

## 2017-06-18 DIAGNOSIS — K740 Hepatic fibrosis: Secondary | ICD-10-CM | POA: Diagnosis not present

## 2017-06-20 ENCOUNTER — Other Ambulatory Visit: Payer: Self-pay | Admitting: Nurse Practitioner

## 2017-06-20 DIAGNOSIS — K7469 Other cirrhosis of liver: Secondary | ICD-10-CM

## 2017-07-07 ENCOUNTER — Other Ambulatory Visit: Payer: Self-pay | Admitting: Rheumatology

## 2017-07-07 MED ORDER — COLCHICINE 0.6 MG PO TABS: 0.6000 mg | ORAL_TABLET | Freq: Every day | ORAL | 2 refills | Status: DC | PRN

## 2017-07-07 NOTE — Telephone Encounter (Signed)
Patient request rx called in on Colchicine. Patient uses [facility].

## 2017-07-07 NOTE — Telephone Encounter (Signed)
ok 

## 2017-07-07 NOTE — Telephone Encounter (Signed)
Last Visit: 01/16/17 Next Visit: 07/23/17 Labs: 01/16/17 Creatine elevated 1.43 previously 1.32 GFR 39 Previously 42  Okay to refill Colchicine?

## 2017-07-09 NOTE — Progress Notes (Deleted)
Office Visit Note  Patient: [name]             Date of Birth: 1932-01-05           MRN: [id number]             PCP: Lucianne Lei, MD Referring: Lucianne Lei, MD Visit Date: 07/23/2017 Occupation: @GUAROCC @    Subjective:  No chief complaint on file.   History of Present Illness: [name] is a 83 y.o. female ***   Activities of Daily Living:  Patient reports morning stiffness for *** {minute/hour:19697}.   Patient {ACTIONS;DENIES/REPORTS:21021675::"Denies"} nocturnal pain.  Difficulty dressing/grooming: {ACTIONS;DENIES/REPORTS:21021675::"Denies"} Difficulty climbing stairs: {ACTIONS;DENIES/REPORTS:21021675::"Denies"} Difficulty getting out of chair: {ACTIONS;DENIES/REPORTS:21021675::"Denies"} Difficulty using hands for taps, buttons, cutlery, and/or writing: {ACTIONS;DENIES/REPORTS:21021675::"Denies"}   No Rheumatology ROS completed.   PMFS History:  Patient Active Problem List   Diagnosis Date Noted  . Primary osteoarthritis of both hands 01/09/2017  . Primary osteoarthritis of both feet 01/09/2017  . Spondylosis of lumbar region without myelopathy or radiculopathy 01/09/2017  . History of hepatitis 12/05/2016  . 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 History of chronic kidney disease 12/05/2016  . Pancreatitis, acute 04/23/2016  . Essential hypertension 04/23/2016  . Upper abdominal pain 04/22/2016  . Abdominal pain 04/22/2016  . Vasovagal syncope 09/19/2015  . Postprandial nausea 03/08/2015  . Syncope 03/07/2015  . Dyspnea on exertion 03/07/2015  . Blood loss anemia 04/16/2014  . Acute blood loss anemia 04/16/2014  . Melena 04/16/2014  . CKD (chronic kidney disease), stage III 03/06/2014  . Nonspecific abnormal finding in stool contents 11/06/2013  . Anemia 11/05/2013  . Renal insufficiency   . Breast cancer, left breast ([place]) 09/01/2012  . Alcohol use 07/06/2012  . Near syncope 07/06/2012   . Hepatitis   . Lipid disorder   . Anxiety   . Hypertension   . Anemia of chronic disease 04/16/2012    Past Medical History:  Diagnosis Date  . Abdominal pain 04/2016  . Anemia of chronic disease   . Anxiety   . Breast cancer ([place]) 08/28/12   IDC left breast bx=invasive ductal ca,ER/PR=+  . Breast cancer ([place]) 09/01/2012  . Hepatitis   . Hypertension   . Lipid disorder   . RA (rheumatoid arthritis) ([place])   . Radiation 11/12/12-12/15/11   Left Breast/50 Gy  . Renal insufficiency   . Wears dentures    top  . Wears glasses     Family History  Problem Relation Age of Onset  . Cancer Father   . Prostate cancer Father   . Tuberculosis Mother   . Breast cancer Cousin    Past Surgical History:  Procedure Laterality Date  . ABDOMINAL HYSTERECTOMY      approx 20 years ago  . APPENDECTOMY     20 years ago  . bones spur     removed 20 years ago  . COLONOSCOPY  2009?  . ENTEROSCOPY N/A 11/08/2013   Procedure: ENTEROSCOPY;  Surgeon: Beryle Beams, MD;  Location[facility] ENDOSCOPY;  Service: Endoscopy;  Laterality: N/A;  . ENTEROSCOPY N/A 04/18/2014   Procedure: ENTEROSCOPY;  Surgeon: Beryle Beams, MD;  Location: [name] ENDOSCOPY;  Service: Endoscopy;  Laterality: N/A;  . FOOT SURGERY  1992   bone spurs both feet  . PARTIAL MASTECTOMY WITH NEEDLE LOCALIZATION AND AXILLARY SENTINEL LYMPH NODE BX  10/08/2012   Procedure: PARTIAL MASTECTOMY WITH NEEDLE LOCALIZATION AND AXILLARY SENTINEL LYMPH NODE BX;  Surgeon: Adin Hector, MD;  Location: [facility];  Service: General;  Laterality: Left;  Left partial mastectomy with Needle localization and Sentinel lymph node biospy   Social History   Social History Narrative   Widowed lives with one of her sons   3 sons and 2 daughters   Menses age 67 or 76   Age 61 with first child   No breast feeding   No HRT     Objective: Vital Signs: There were no vitals taken for this visit.   Physical Exam   Musculoskeletal Exam:  ***  CDAI Exam: No CDAI exam completed.    Investigation: No additional findings.Uric Acid: 4.8 in 12/2016 CBC Latest Ref Rng & Units 02/07/2017 01/16/2017 08/06/2016  WBC 4.0 - 10.5 K/uL 6.2 7.6 5.7  Hemoglobin 12.0 - 15.0 g/dL 11.1(L) 12.0 10.9(L)  Hematocrit 36.0 - 46.0 % 33.1(L) 35.7 31.9(L)  Platelets 150 - 400 K/uL 186 260 253   CMP Latest Ref Rng & Units 02/07/2017 01/16/2017 08/06/2016  Glucose 65 - 99 mg/dL 112(H) 79 112(H)  BUN 6 - 20 mg/dL 39(H) 24 9  Creatinine 0.44 - 1.00 mg/dL 1.87(H) 1.43(H) 1.32(H)  Sodium 135 - 145 mmol/L 134(L) 138 134(L)  Potassium 3.5 - 5.1 mmol/L 4.4 4.5 4.6  Chloride 101 - 111 mmol/L 101 103 99(L)  CO2 22 - 32 mmol/L 23 23 25   Calcium 8.9 - 10.3 mg/dL 9.4 10.0 10.1  Total Protein 6.5 - 8.1 g/dL 7.3 8.2(H) 7.9  Total Bilirubin 0.3 - 1.2 mg/dL 0.7 0.3 0.7  Alkaline Phos 38 - 126 U/L 64 84 102  AST 15 - 41 U/L 29 27 28   ALT 14 - 54 U/L 22 18 19     Imaging: No results found.  Speciality Comments: No specialty comments available.    Procedures:  No procedures performed Allergies: Patient has no known allergies.   Assessment / Plan:     Visit Diagnoses: No diagnosis found.    Orders: No orders of the defined types were placed in this encounter.  No orders of the defined types were placed in this encounter.   Face-to-face time spent with patient was *** minutes. 50% of time was spent in counseling and coordination of care.  Follow-Up Instructions: No Follow-up on file.   Earnestine Mealing, NT  Note - This record has been created using Editor, commissioning.  Chart creation errors have been sought, but may not always  have been located. Such creation errors do not reflect on  the standard of medical care.

## 2017-07-10 ENCOUNTER — Ambulatory Visit: Payer: Medicare Other | Admitting: Hematology and Oncology

## 2017-07-21 ENCOUNTER — Telehealth: Payer: Self-pay

## 2017-07-21 ENCOUNTER — Other Ambulatory Visit: Payer: Self-pay

## 2017-07-21 ENCOUNTER — Other Ambulatory Visit: Payer: Self-pay | Admitting: Hematology and Oncology

## 2017-07-21 DIAGNOSIS — C50912 Malignant neoplasm of unspecified site of left female breast: Secondary | ICD-10-CM

## 2017-07-21 DIAGNOSIS — R110 Nausea: Secondary | ICD-10-CM

## 2017-07-21 MED ORDER — METOCLOPRAMIDE HCL 5 MG PO TABS: ORAL_TABLET | ORAL | 0 refills | Status: DC

## 2017-07-21 NOTE — Telephone Encounter (Signed)
Pt called for metoclopramide refill. Done.

## 2017-07-21 NOTE — Telephone Encounter (Signed)
Pt called for reglan refill.  Pt cancelled her 1 year f/u that was due 9/20. Pt is not having nausea at present, she just wants to keep it on hand. Call transferred to scheduler.

## 2017-07-23 ENCOUNTER — Ambulatory Visit: Payer: Medicare Other | Admitting: Rheumatology

## 2017-07-23 ENCOUNTER — Ambulatory Visit: Payer: 59 | Admitting: Rheumatology

## 2017-07-24 DIAGNOSIS — I10 Essential (primary) hypertension: Secondary | ICD-10-CM | POA: Diagnosis not present

## 2017-07-28 ENCOUNTER — Telehealth: Payer: Self-pay | Admitting: Rheumatology

## 2017-07-28 NOTE — Telephone Encounter (Signed)
Patient left a message requesting a refill on Allipurinal to be sent into [facility] on [address].

## 2017-07-28 NOTE — Telephone Encounter (Signed)
Last Visit: 01/16/17 Next Visit: Patient no showed appointment on 07/23/17 Labs: 01/16/17 Creatine elevated 1.43 previously 1.32 GFR 39 Previously 42  Patient has picked up prescription refill she had on file there. Patient advised she is due for labs and a follow up appointment. Patient states she will call back to make appointment and come by the office before the end of the month to update labs.

## 2017-07-30 ENCOUNTER — Ambulatory Visit
Admission: RE | Admit: 2017-07-30 | Discharge: 2017-07-30 | Disposition: A | Discharge status: Home / Self Care | Payer: Medicare Other | Source: Ambulatory Visit | Attending: Nurse Practitioner | Admitting: Nurse Practitioner

## 2017-07-30 DIAGNOSIS — K7460 Unspecified cirrhosis of liver: Secondary | ICD-10-CM | POA: Diagnosis not present

## 2017-07-30 DIAGNOSIS — K7469 Other cirrhosis of liver: Secondary | ICD-10-CM

## 2017-08-07 DIAGNOSIS — Z0000 Encounter for general adult medical examination without abnormal findings: Secondary | ICD-10-CM | POA: Diagnosis not present

## 2017-08-26 ENCOUNTER — Ambulatory Visit: Payer: Medicare Other | Admitting: Hematology and Oncology

## 2017-10-31 ENCOUNTER — Telehealth: Payer: Self-pay | Admitting: Rheumatology

## 2017-11-03 ENCOUNTER — Other Ambulatory Visit: Payer: Self-pay | Admitting: *Deleted

## 2017-11-03 NOTE — Telephone Encounter (Signed)
Refill request received via fax for Allopurinol  Last Visit: 01/16/17 Next visit: was due November 2018. Message sent to the front to schedule the patient.  Labs: 01/16/17 Creatine elevated 1.43 previously 1.32 GFR 39 Previously 42  Patient advised she is due for labs. Patient will come by office 11/04/17 for labs.

## 2017-11-04 ENCOUNTER — Other Ambulatory Visit: Payer: Self-pay | Admitting: *Deleted

## 2017-11-04 ENCOUNTER — Other Ambulatory Visit: Payer: Self-pay

## 2017-11-04 DIAGNOSIS — M2550 Pain in unspecified joint: Secondary | ICD-10-CM

## 2017-11-04 DIAGNOSIS — Z79899 Other long term (current) drug therapy: Secondary | ICD-10-CM

## 2017-11-04 MED ORDER — ALLOPURINOL 100 MG PO TABS: 100.0000 mg | ORAL_TABLET | Freq: Every day | ORAL | 0 refills | Status: DC

## 2017-11-04 NOTE — Telephone Encounter (Signed)
Patient update labs 11/04/17

## 2017-11-05 LAB — COMPLETE METABOLIC PANEL WITH GFR
AG RATIO: 1.2 (calc) (ref 1.0–2.5)
ALBUMIN MSPROF: 4.1 g/dL (ref 3.6–5.1)
ALT: 15 U/L (ref 6–29)
AST: 22 U/L (ref 10–35)
Alkaline phosphatase (APISO): 80 U/L (ref 33–130)
BUN / CREAT RATIO: 12 (calc) (ref 6–22)
BUN: 14 mg/dL (ref 7–25)
CALCIUM: 9.9 mg/dL (ref 8.6–10.4)
CO2: 29 mmol/L (ref 20–32)
CREATININE: 1.18 mg/dL — AB (ref 0.60–0.88)
Chloride: 104 mmol/L (ref 98–110)
GFR, Est African American: 49 mL/min/{1.73_m2} — ABNORMAL LOW (ref >=60)
GFR, Est Non African American: 43 mL/min/{1.73_m2} — ABNORMAL LOW (ref >=60)
GLOBULIN: 3.5 g/dL (ref 1.9–3.7)
Glucose, Bld: 84 mg/dL (ref 65–99)
POTASSIUM: 4.2 mmol/L (ref 3.5–5.3)
SODIUM: 140 mmol/L (ref 135–146)
TOTAL PROTEIN: 7.6 g/dL (ref 6.1–8.1)
Total Bilirubin: 0.4 mg/dL (ref 0.2–1.2)

## 2017-11-05 LAB — CBC WITH DIFFERENTIAL/PLATELET
BASOS ABS: 28 {cells}/uL (ref 0–200)
BASOS PCT: 0.5 %
EOS ABS: 77 {cells}/uL (ref 15–500)
Eosinophils Relative: 1.4 %
HCT: 37.8 % (ref 35.0–45.0)
Hemoglobin: 13 g/dL (ref 11.7–15.5)
Lymphs Abs: 1045 cells/uL (ref 850–3900)
MCH: 31.5 pg (ref 27.0–33.0)
MCHC: 34.4 g/dL (ref 32.0–36.0)
MCV: 91.5 fL (ref 80.0–100.0)
MPV: 10.8 fL (ref 7.5–12.5)
Monocytes Relative: 8.5 %
NEUTROS PCT: 70.6 %
Neutro Abs: 3883 cells/uL (ref 1500–7800)
PLATELETS: 229 10*3/uL (ref 140–400)
RBC: 4.13 10*6/uL (ref 3.80–5.10)
RDW: 13.5 % (ref 11.0–15.0)
TOTAL LYMPHOCYTE: 19 %
WBC: 5.5 10*3/uL (ref 3.8–10.8)
WBCMIX: 468 {cells}/uL (ref 200–950)

## 2017-11-05 LAB — URIC ACID: Uric Acid, Serum: 5.8 mg/dL (ref 2.5–7.0)

## 2017-11-05 NOTE — Progress Notes (Signed)
Labs are stable.

## 2017-11-14 NOTE — Telephone Encounter (Signed)
Opened in error

## 2017-11-17 ENCOUNTER — Other Ambulatory Visit: Payer: Self-pay | Admitting: Hematology and Oncology

## 2017-11-17 DIAGNOSIS — R110 Nausea: Secondary | ICD-10-CM

## 2017-11-27 DIAGNOSIS — G250 Essential tremor: Secondary | ICD-10-CM | POA: Diagnosis not present

## 2017-11-27 DIAGNOSIS — I10 Essential (primary) hypertension: Secondary | ICD-10-CM | POA: Diagnosis not present

## 2017-11-27 DIAGNOSIS — I749 Embolism and thrombosis of unspecified artery: Secondary | ICD-10-CM | POA: Diagnosis not present

## 2017-12-03 DIAGNOSIS — Z1211 Encounter for screening for malignant neoplasm of colon: Secondary | ICD-10-CM | POA: Diagnosis not present

## 2017-12-03 DIAGNOSIS — Z1212 Encounter for screening for malignant neoplasm of rectum: Secondary | ICD-10-CM | POA: Diagnosis not present

## 2017-12-17 ENCOUNTER — Other Ambulatory Visit: Payer: Self-pay | Admitting: Hematology and Oncology

## 2017-12-17 DIAGNOSIS — R110 Nausea: Secondary | ICD-10-CM

## 2017-12-24 ENCOUNTER — Other Ambulatory Visit: Payer: Self-pay

## 2017-12-24 DIAGNOSIS — R110 Nausea: Secondary | ICD-10-CM

## 2017-12-24 MED ORDER — METOCLOPRAMIDE HCL 5 MG PO TABS: ORAL_TABLET | ORAL | 3 refills | Status: DC

## 2018-01-30 ENCOUNTER — Other Ambulatory Visit: Payer: Self-pay | Admitting: Rheumatology

## 2018-01-30 NOTE — Progress Notes (Deleted)
Office Visit Note  Patient: [name]             Date of Birth: 1932-03-08           MRN: [id number]             PCP: Lucianne Lei, MD Referring: Lucianne Lei, MD Visit Date: 02/09/2018 Occupation: @GUAROCC @    Subjective:  No chief complaint on file.   History of Present Illness: [name] is a 84 y.o. female ***   Activities of Daily Living:  Patient reports morning stiffness for *** {minute/hour:19697}.   Patient {ACTIONS;DENIES/REPORTS:21021675::"Denies"} nocturnal pain.  Difficulty dressing/grooming: {ACTIONS;DENIES/REPORTS:21021675::"Denies"} Difficulty climbing stairs: {ACTIONS;DENIES/REPORTS:21021675::"Denies"} Difficulty getting out of chair: {ACTIONS;DENIES/REPORTS:21021675::"Denies"} Difficulty using hands for taps, buttons, cutlery, and/or writing: {ACTIONS;DENIES/REPORTS:21021675::"Denies"}   No Rheumatology ROS completed.   PMFS History:  Patient Active Problem List   Diagnosis Date Noted  . Primary osteoarthritis of both hands 01/09/2017  . Primary osteoarthritis of both feet 01/09/2017  . Spondylosis of lumbar region without myelopathy or radiculopathy 01/09/2017  . History of hepatitis 12/05/2016  . 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 History of chronic kidney disease 12/05/2016  . Pancreatitis, acute 04/23/2016  . Essential hypertension 04/23/2016  . Upper abdominal pain 04/22/2016  . Abdominal pain 04/22/2016  . Vasovagal syncope 09/19/2015  . Postprandial nausea 03/08/2015  . Syncope 03/07/2015  . Dyspnea on exertion 03/07/2015  . Blood loss anemia 04/16/2014  . Acute blood loss anemia 04/16/2014  . Melena 04/16/2014  . CKD (chronic kidney disease), stage III ([place]) 03/06/2014  . Nonspecific abnormal finding in stool contents 11/06/2013  . Anemia 11/05/2013  . Renal insufficiency   . Breast cancer, left breast ([place]) 09/01/2012  . Alcohol use 07/06/2012  . Near syncope  07/06/2012  . Hepatitis   . Lipid disorder   . Anxiety   . Hypertension   . Anemia of chronic disease 04/16/2012    Past Medical History:  Diagnosis Date  . Abdominal pain 04/2016  . Anemia of chronic disease   . Anxiety   . Breast cancer ([place]) 08/28/12   IDC left breast bx=invasive ductal ca,ER/PR=+  . Breast cancer ([place]) 09/01/2012  . Hepatitis   . Hypertension   . Lipid disorder   . RA (rheumatoid arthritis) ([place])   . Radiation 11/12/12-12/15/11   Left Breast/50 Gy  . Renal insufficiency   . Wears dentures    top  . Wears glasses     Family History  Problem Relation Age of Onset  . Cancer Father   . Prostate cancer Father   . Tuberculosis Mother   . Breast cancer Cousin    Past Surgical History:  Procedure Laterality Date  . ABDOMINAL HYSTERECTOMY      approx 20 years ago  . APPENDECTOMY     20 years ago  . bones spur     removed 20 years ago  . COLONOSCOPY  2009?  . ENTEROSCOPY N/A 11/08/2013   Procedure: ENTEROSCOPY;  Surgeon: Beryle Beams, MD;  Location: [facility] ENDOSCOPY;  Service: Endoscopy;  Laterality: N/A;  . ENTEROSCOPY N/A 04/18/2014   Procedure: ENTEROSCOPY;  Surgeon: Beryle Beams, MD;  Location: [facility] ENDOSCOPY;  Service: Endoscopy;  Laterality: N/A;  . FOOT SURGERY  1992   bone spurs both feet  . PARTIAL MASTECTOMY WITH NEEDLE LOCALIZATION AND AXILLARY SENTINEL LYMPH NODE BX  10/08/2012   Procedure: PARTIAL MASTECTOMY WITH NEEDLE LOCALIZATION AND AXILLARY SENTINEL LYMPH NODE BX;  Surgeon: Adin Hector, MD;  Location: [place];  Service: General;  Laterality: Left;  Left partial mastectomy with Needle localization and Sentinel lymph node biospy   Social History   Social History Narrative   Widowed lives with one of her sons   3 sons and 2 daughters   Menses age 17 or 35   Age 54 with first child   No breast feeding   No HRT     Objective: Vital Signs: There were no vitals taken for this visit.   Physical Exam    Musculoskeletal Exam: ***  CDAI Exam: No CDAI exam completed.    Investigation: No additional findings. CBC Latest Ref Rng & Units 11/04/2017 02/07/2017 01/16/2017  WBC 3.8 - 10.8 Thousand/uL 5.5 6.2 7.6  Hemoglobin 11.7 - 15.5 g/dL 13.0 11.1(L) 12.0  Hematocrit 35.0 - 45.0 % 37.8 33.1(L) 35.7  Platelets 140 - 400 Thousand/uL 229 186 260   CMP Latest Ref Rng & Units 11/04/2017 02/07/2017 01/16/2017  Glucose 65 - 99 mg/dL 84 112(H) 79  BUN 7 - 25 mg/dL 14 39(H) 24  Creatinine 0.60 - 0.88 mg/dL 1.18(H) 1.87(H) 1.43(H)  Sodium 135 - 146 mmol/L 140 134(L) 138  Potassium 3.5 - 5.3 mmol/L 4.2 4.4 4.5  Chloride 98 - 110 mmol/L 104 101 103  CO2 20 - 32 mmol/L 29 23 23   Calcium 8.6 - 10.4 mg/dL 9.9 9.4 10.0  Total Protein 6.1 - 8.1 g/dL 7.6 7.3 8.2(H)  Total Bilirubin 0.2 - 1.2 mg/dL 0.4 0.7 0.3  Alkaline Phos 38 - 126 U/L - 64 84  AST 10 - 35 U/L 22 29 27   ALT 6 - 29 U/L 15 22 18     Imaging: No results found.  Speciality Comments: No specialty comments available.    Procedures:  No procedures performed Allergies: Patient has no known allergies.   Assessment / Plan:     Visit Diagnoses: Chronic gout due to renal impairment of multiple sites without tophus  Primary osteoarthritis of both hands  Primary osteoarthritis of both feet  Spondylosis of lumbar region without myelopathy or radiculopathy  CKD (chronic kidney disease), stage III (HCC)  Essential hypertension  Anemia of chronic disease  Malignant neoplasm of left female breast, unspecified estrogen receptor status, unspecified site of breast ([place]) - s/p left lumpectomy   History of hepatitis    Orders: No orders of the defined types were placed in this encounter.  No orders of the defined types were placed in this encounter.   Face-to-face time spent with patient was *** minutes. 50% of time was spent in counseling and coordination of care.  Follow-Up Instructions: No follow-ups on file.   Ofilia Neas, PA-C  Note - This record has been created using Dragon software.  Chart creation errors have been sought, but may not always  have been located. Such creation errors do not reflect on  the standard of medical care.

## 2018-02-04 DIAGNOSIS — K7469 Other cirrhosis of liver: Secondary | ICD-10-CM | POA: Diagnosis not present

## 2018-02-05 ENCOUNTER — Other Ambulatory Visit: Payer: Self-pay | Admitting: Nurse Practitioner

## 2018-02-05 DIAGNOSIS — K7469 Other cirrhosis of liver: Secondary | ICD-10-CM

## 2018-02-09 ENCOUNTER — Ambulatory Visit: Payer: Medicare Other | Admitting: Physician Assistant

## 2018-02-09 NOTE — Progress Notes (Signed)
Office Visit Note  Patient: [name]             Date of Birth: [birth year]           MRN: [id number]             PCP: Lucianne Lei, MD Referring: Lucianne Lei, MD Visit Date: 02/12/2018 Occupation: @GUAROCC @    Subjective:  Medication monitoring   History of Present Illness: [name] is a 84 y.o. female with history of gout and osteoarthritis.  Patient states that she has been taking allopurinol 100 mg daily.  She states that she ran out of her allopurinol 2 weeks ago and has not been taking it.  She states that she takes colchicine 0.6 mg as needed.  She denies having a gout flare in over a year.  She states that over the past few days she is having some tingling in her right foot but is not a full-blown flare.  She denies any joint pain or joint swelling at this time.  She denies any joint stiffness.  She states that her lower back is doing well.   Activities of Daily Living:  Patient reports morning stiffness for 0 minutes.   Patient Denies nocturnal pain.  Difficulty dressing/grooming: Denies Difficulty climbing stairs: Denies Difficulty getting out of chair: Denies Difficulty using hands for taps, buttons, cutlery, and/or writing: Denies   Review of Systems  Constitutional: Negative for fatigue.  HENT: Negative for mouth sores, mouth dryness and nose dryness.   Eyes: Negative for pain, visual disturbance and dryness.  Respiratory: Negative for cough, hemoptysis, shortness of breath and difficulty breathing.   Cardiovascular: Negative for chest pain, palpitations, hypertension and swelling in legs/feet.  Gastrointestinal: Negative for blood in stool, constipation and diarrhea.  Endocrine: Negative for increased urination.  Genitourinary: Negative for painful urination.  Musculoskeletal: Negative for arthralgias, joint pain, joint swelling, myalgias, muscle weakness, morning stiffness, muscle tenderness and myalgias.  Skin: Negative for color change,  pallor, rash, hair loss, nodules/bumps, skin tightness, ulcers and sensitivity to sunlight.  Allergic/Immunologic: Negative for susceptible to infections.  Neurological: Negative for dizziness, numbness, headaches and weakness.  Hematological: Negative for swollen glands.  Psychiatric/Behavioral: Negative for depressed mood and sleep disturbance. The patient is not nervous/anxious.     PMFS History:  Patient Active Problem List   Diagnosis Date Noted  . Primary osteoarthritis of both hands 01/09/2017  . Primary osteoarthritis of both feet 01/09/2017  . Spondylosis of lumbar region without myelopathy or radiculopathy 01/09/2017  . History of hepatitis 12/05/2016  . 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 History of chronic kidney disease 12/05/2016  . Pancreatitis, acute 04/23/2016  . Essential hypertension 04/23/2016  . Upper abdominal pain 04/22/2016  . Abdominal pain 04/22/2016  . Vasovagal syncope 09/19/2015  . Postprandial nausea 03/08/2015  . Syncope 03/07/2015  . Dyspnea on exertion 03/07/2015  . Blood loss anemia 04/16/2014  . Acute blood loss anemia 04/16/2014  . Melena 04/16/2014  . CKD (chronic kidney disease), stage III ([place]) 03/06/2014  . Nonspecific abnormal finding in stool contents 11/06/2013  . Anemia 11/05/2013  . Renal insufficiency   . Breast cancer, left breast ([place]) 09/01/2012  . Alcohol use 07/06/2012  . Near syncope 07/06/2012  . Hepatitis   . Lipid disorder   . Anxiety   . Hypertension   . Anemia of chronic disease 04/16/2012    Past Medical History:  Diagnosis Date  . Abdominal  pain 04/2016  . Anemia of chronic disease   . Anxiety   . Breast cancer ([place]) 08/28/12   IDC left breast bx=invasive ductal ca,ER/PR=+  . Breast cancer ([place]) 09/01/2012  . Hepatitis   . Hypertension   . Lipid disorder   . RA (rheumatoid arthritis) ([place])   . Radiation 11/12/12-12/15/11   Left Breast/50 Gy  . Renal  insufficiency   . Wears dentures    top  . Wears glasses     Family History  Problem Relation Age of Onset  . Cancer Father   . Prostate cancer Father   . Tuberculosis Mother   . Breast cancer Cousin    Past Surgical History:  Procedure Laterality Date  . ABDOMINAL HYSTERECTOMY      approx 20 years ago  . APPENDECTOMY     20 years ago  . bones spur     removed 20 years ago  . COLONOSCOPY  2009?  . ENTEROSCOPY N/A 11/08/2013   Procedure: ENTEROSCOPY;  Surgeon: Beryle Beams, MD;  Location: [facility] ENDOSCOPY;  Service: Endoscopy;  Laterality: N/A;  . ENTEROSCOPY N/A 04/18/2014   Procedure: ENTEROSCOPY;  Surgeon: Beryle Beams, MD;  Location: [facility] ENDOSCOPY;  Service: Endoscopy;  Laterality: N/A;  . FOOT SURGERY  1992   bone spurs both feet  . PARTIAL MASTECTOMY WITH NEEDLE LOCALIZATION AND AXILLARY SENTINEL LYMPH NODE BX  10/08/2012   Procedure: PARTIAL MASTECTOMY WITH NEEDLE LOCALIZATION AND AXILLARY SENTINEL LYMPH NODE BX;  Surgeon: Adin Hector, MD;  Location: [place];  Service: General;  Laterality: Left;  Left partial mastectomy with Needle localization and Sentinel lymph node biospy   Social History   Social History Narrative   Widowed lives with one of her sons   3 sons and 2 daughters   Menses age 77 or 68   Age 73 with first child   No breast feeding   No HRT     Objective: Vital Signs: BP (!) 107/55 (BP Location: Left Arm, Patient Position: Sitting, Cuff Size: Small)   Pulse (!) 55   Resp 12   Ht 5\' 3"  (1.6 m)   Wt 126 lb (57.2 kg)   BMI 22.32 kg/m    Physical Exam  Constitutional: She is oriented to person, place, and time. She appears well-developed and well-nourished.  HENT:  Head: Normocephalic and atraumatic.  Eyes: Conjunctivae and EOM are normal.  Neck: Normal range of motion.  Cardiovascular: Normal rate, regular rhythm, normal heart sounds and intact distal pulses.  Pulmonary/Chest: Effort normal and breath sounds normal.    Abdominal: Soft. Bowel sounds are normal.  Lymphadenopathy:    She has no cervical adenopathy.  Neurological: She is alert and oriented to person, place, and time.  Skin: Skin is warm and dry. Capillary refill takes less than 2 seconds.  Psychiatric: She has a normal mood and affect. Her behavior is normal.  Nursing note and vitals reviewed.    Musculoskeletal Exam:   CDAI Exam: No CDAI exam completed.    Investigation: No additional findings. CBC Latest Ref Rng & Units 11/04/2017 02/07/2017 01/16/2017  WBC 3.8 - 10.8 Thousand/uL 5.5 6.2 7.6  Hemoglobin 11.7 - 15.5 g/dL 13.0 11.1(L) 12.0  Hematocrit 35.0 - 45.0 % 37.8 33.1(L) 35.7  Platelets 140 - 400 Thousand/uL 229 186 260   CMP Latest Ref Rng & Units 11/04/2017 02/07/2017 01/16/2017  Glucose 65 - 99 mg/dL 84 112(H) 79  BUN 7 - 25 mg/dL 14 39(H) 24  Creatinine 0.60 - 0.88 mg/dL 1.18(H) 1.87(H) 1.43(H)  Sodium 135 - 146 mmol/L 140 134(L) 138  Potassium 3.5 - 5.3 mmol/L 4.2 4.4 4.5  Chloride 98 - 110 mmol/L 104 101 103  CO2 20 - 32 mmol/L 29 23 23   Calcium 8.6 - 10.4 mg/dL 9.9 9.4 10.0  Total Protein 6.1 - 8.1 g/dL 7.6 7.3 8.2(H)  Total Bilirubin 0.2 - 1.2 mg/dL 0.4 0.7 0.3  Alkaline Phos 38 - 126 U/L - 64 84  AST 10 - 35 U/L 22 29 27   ALT 6 - 29 U/L 15 22 18     Imaging: No results found.  Speciality Comments: No specialty comments available.    Procedures:  No procedures performed Allergies: Patient has no known allergies.   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  She ran out of her prescription of allopurinol 2 weeks ago.  Prior to that she was taking allopurinol 100 mg daily and colchicine 0.6 mg as needed.  A refill of Allopurinol 100 mg 1 tablet daily was sent to the pharmacy.  She was advised to take Colchicine 0.6 mg 1 tablet daily for 1 week while she is restarting Allopurinol. Uric acid was 5.8 on  11/04/2017.  We will recheck uric acid, CBC, and CMP at her next visit.   Primary osteoarthritis of both hands: She has PIP and DIP synovial thickening consistent with osteoarthritis.  She has no joint pain or joint swelling at this time.  She has no joint stiffness in her hands.  Primary osteoarthritis of both feet: She has no discomfort or tenderness at this time.  She wears proper fitting shoes.   Spondylosis of lumbar region without myelopathy or radiculopathy: She has no midline spinal tenderness.  No discomfort at this time.  Other medical conditions are listed as follows:  Essential hypertension  History of chronic kidney disease  History of hepatitis - Hepatitis C  History of breast cancer - Status post left lumpectomy  History of pancreatitis    Orders: No orders of the defined types were placed in this encounter.  Meds ordered this encounter  Medications  . allopurinol (ZYLOPRIM) 100 MG tablet    Sig: Take 1 tablet (100 mg total) by mouth daily.    Dispense:  90 tablet    Refill:  0     Follow-Up Instructions: Return in about 6 months (around 08/14/2018) for Gout, Osteoarthritis.   Ofilia Neas, PA-C  Note - This record has been created using Dragon software.  Chart creation errors have been sought, but may not always  have been located. Such creation errors do not reflect on  the standard of medical care.

## 2018-02-12 ENCOUNTER — Encounter: Payer: Self-pay | Admitting: Physician Assistant

## 2018-02-12 ENCOUNTER — Ambulatory Visit (INDEPENDENT_AMBULATORY_CARE_PROVIDER_SITE_OTHER): Payer: Medicare Other | Admitting: Physician Assistant

## 2018-02-12 VITALS — BP 107/55 | HR 55 | Resp 12 | Ht 63.0 in | Wt 126.0 lb

## 2018-02-12 DIAGNOSIS — Z87448 Personal history of other diseases of urinary system: Secondary | ICD-10-CM

## 2018-02-12 DIAGNOSIS — I10 Essential (primary) hypertension: Secondary | ICD-10-CM | POA: Diagnosis not present

## 2018-02-12 DIAGNOSIS — M47816 Spondylosis without myelopathy or radiculopathy, lumbar region: Secondary | ICD-10-CM | POA: Diagnosis not present

## 2018-02-12 DIAGNOSIS — M1A39X0 Chronic gout due to renal impairment, multiple sites, without tophus (tophi): Secondary | ICD-10-CM

## 2018-02-12 DIAGNOSIS — M19071 Primary osteoarthritis, right ankle and foot: Secondary | ICD-10-CM

## 2018-02-12 DIAGNOSIS — Z853 Personal history of malignant neoplasm of breast: Secondary | ICD-10-CM

## 2018-02-12 DIAGNOSIS — M19072 Primary osteoarthritis, left ankle and foot: Secondary | ICD-10-CM

## 2018-02-12 DIAGNOSIS — Z8719 Personal history of other diseases of the digestive system: Secondary | ICD-10-CM

## 2018-02-12 DIAGNOSIS — M19041 Primary osteoarthritis, right hand: Secondary | ICD-10-CM | POA: Diagnosis not present

## 2018-02-12 DIAGNOSIS — M19042 Primary osteoarthritis, left hand: Secondary | ICD-10-CM | POA: Diagnosis not present

## 2018-02-12 DIAGNOSIS — Z8619 Personal history of other infectious and parasitic diseases: Secondary | ICD-10-CM

## 2018-02-12 MED ORDER — ALLOPURINOL 100 MG PO TABS: 100.0000 mg | ORAL_TABLET | Freq: Every day | ORAL | 0 refills | Status: DC

## 2018-02-18 ENCOUNTER — Ambulatory Visit
Admission: RE | Admit: 2018-02-18 | Discharge: 2018-02-18 | Disposition: A | Discharge status: Home / Self Care | Payer: Medicare Other | Source: Ambulatory Visit | Attending: Nurse Practitioner | Admitting: Nurse Practitioner

## 2018-02-18 DIAGNOSIS — K7460 Unspecified cirrhosis of liver: Secondary | ICD-10-CM | POA: Diagnosis not present

## 2018-02-18 DIAGNOSIS — K7469 Other cirrhosis of liver: Secondary | ICD-10-CM

## 2018-03-14 ENCOUNTER — Other Ambulatory Visit: Payer: Self-pay | Admitting: Hematology and Oncology

## 2018-03-14 DIAGNOSIS — C50212 Malignant neoplasm of upper-inner quadrant of left female breast: Secondary | ICD-10-CM

## 2018-03-18 ENCOUNTER — Other Ambulatory Visit: Payer: Self-pay | Admitting: Hematology and Oncology

## 2018-03-18 DIAGNOSIS — C50212 Malignant neoplasm of upper-inner quadrant of left female breast: Secondary | ICD-10-CM

## 2018-03-19 ENCOUNTER — Other Ambulatory Visit: Payer: Self-pay

## 2018-03-19 DIAGNOSIS — C50212 Malignant neoplasm of upper-inner quadrant of left female breast: Secondary | ICD-10-CM

## 2018-03-19 MED ORDER — ANASTROZOLE 1 MG PO TABS: ORAL_TABLET | ORAL | 0 refills | Status: DC

## 2018-03-19 NOTE — Telephone Encounter (Signed)
Received refill request for anastrozole but noted pt hasn't been seen since 2017 and cancelled office appts twice in 2018.  Outgoing call to pt.  She reports that she has a hard time getting a ride to her appts but has been doing fine, denies any problems.  Pt said she didn't want to run out of her medicine and needs the refill.  Let her know I would submit the refill for a month but asked what was best days/times for f/u appt for family/ friend to take her.  Per pt, made appt for June 26 at 9:30 am with Wilber Bihari NP.  No other needs per pt at this time.

## 2018-04-02 DIAGNOSIS — Z6821 Body mass index (BMI) 21.0-21.9, adult: Secondary | ICD-10-CM | POA: Diagnosis not present

## 2018-04-02 DIAGNOSIS — I10 Essential (primary) hypertension: Secondary | ICD-10-CM | POA: Diagnosis not present

## 2018-04-02 DIAGNOSIS — K738 Other chronic hepatitis, not elsewhere classified: Secondary | ICD-10-CM | POA: Diagnosis not present

## 2018-04-02 DIAGNOSIS — Z0000 Encounter for general adult medical examination without abnormal findings: Secondary | ICD-10-CM | POA: Diagnosis not present

## 2018-04-02 DIAGNOSIS — G250 Essential tremor: Secondary | ICD-10-CM | POA: Diagnosis not present

## 2018-04-15 ENCOUNTER — Other Ambulatory Visit: Payer: Self-pay | Admitting: Hematology and Oncology

## 2018-04-15 ENCOUNTER — Telehealth: Payer: Self-pay | Admitting: Adult Health

## 2018-04-15 ENCOUNTER — Inpatient Hospital Stay: Payer: Medicare Other | Attending: Adult Health | Admitting: Adult Health

## 2018-04-15 ENCOUNTER — Encounter: Payer: Self-pay | Admitting: Adult Health

## 2018-04-15 ENCOUNTER — Telehealth: Payer: Self-pay

## 2018-04-15 VITALS — BP 130/42 | HR 85 | Temp 97.6°F | Resp 16 | Ht 63.0 in | Wt 120.7 lb

## 2018-04-15 DIAGNOSIS — M8580 Other specified disorders of bone density and structure, unspecified site: Secondary | ICD-10-CM | POA: Insufficient documentation

## 2018-04-15 DIAGNOSIS — C50212 Malignant neoplasm of upper-inner quadrant of left female breast: Secondary | ICD-10-CM

## 2018-04-15 DIAGNOSIS — Z87891 Personal history of nicotine dependence: Secondary | ICD-10-CM

## 2018-04-15 DIAGNOSIS — N183 Chronic kidney disease, stage 3 (moderate): Secondary | ICD-10-CM | POA: Insufficient documentation

## 2018-04-15 DIAGNOSIS — Z170 Estrogen receptor positive status [ER+]: Secondary | ICD-10-CM | POA: Insufficient documentation

## 2018-04-15 DIAGNOSIS — Z79811 Long term (current) use of aromatase inhibitors: Secondary | ICD-10-CM | POA: Insufficient documentation

## 2018-04-15 DIAGNOSIS — M19042 Primary osteoarthritis, left hand: Secondary | ICD-10-CM | POA: Insufficient documentation

## 2018-04-15 DIAGNOSIS — M47816 Spondylosis without myelopathy or radiculopathy, lumbar region: Secondary | ICD-10-CM | POA: Diagnosis not present

## 2018-04-15 DIAGNOSIS — C50912 Malignant neoplasm of unspecified site of left female breast: Secondary | ICD-10-CM

## 2018-04-15 DIAGNOSIS — M19072 Primary osteoarthritis, left ankle and foot: Secondary | ICD-10-CM

## 2018-04-15 DIAGNOSIS — Z800 Family history of malignant neoplasm of digestive organs: Secondary | ICD-10-CM | POA: Insufficient documentation

## 2018-04-15 DIAGNOSIS — R232 Flushing: Secondary | ICD-10-CM | POA: Insufficient documentation

## 2018-04-15 DIAGNOSIS — M069 Rheumatoid arthritis, unspecified: Secondary | ICD-10-CM | POA: Insufficient documentation

## 2018-04-15 DIAGNOSIS — I129 Hypertensive chronic kidney disease with stage 1 through stage 4 chronic kidney disease, or unspecified chronic kidney disease: Secondary | ICD-10-CM | POA: Diagnosis not present

## 2018-04-15 DIAGNOSIS — M19041 Primary osteoarthritis, right hand: Secondary | ICD-10-CM | POA: Diagnosis not present

## 2018-04-15 DIAGNOSIS — E2839 Other primary ovarian failure: Secondary | ICD-10-CM

## 2018-04-15 DIAGNOSIS — Z923 Personal history of irradiation: Secondary | ICD-10-CM

## 2018-04-15 DIAGNOSIS — M19071 Primary osteoarthritis, right ankle and foot: Secondary | ICD-10-CM | POA: Insufficient documentation

## 2018-04-15 DIAGNOSIS — Z8042 Family history of malignant neoplasm of prostate: Secondary | ICD-10-CM

## 2018-04-15 DIAGNOSIS — M1990 Unspecified osteoarthritis, unspecified site: Secondary | ICD-10-CM | POA: Diagnosis not present

## 2018-04-15 DIAGNOSIS — Z803 Family history of malignant neoplasm of breast: Secondary | ICD-10-CM | POA: Insufficient documentation

## 2018-04-15 NOTE — Telephone Encounter (Signed)
Per 6/26 no los

## 2018-04-15 NOTE — Patient Instructions (Signed)
Bone Health Bones protect organs, store calcium, and anchor muscles. Good health habits, such as eating nutritious foods and exercising regularly, are important for maintaining healthy bones. They can also help to prevent a condition that causes bones to lose density and become weak and brittle (osteoporosis). Why is bone mass important? Bone mass refers to the amount of bone tissue that you have. The higher your bone mass, the stronger your bones. An important step toward having healthy bones throughout life is to have strong and dense bones during childhood. A young adult who has a high bone mass is more likely to have a high bone mass later in life. Bone mass at its greatest it is called peak bone mass. A large decline in bone mass occurs in older adults. In women, it occurs about the time of menopause. During this time, it is important to practice good health habits, because if more bone is lost than what is replaced, the bones will become less healthy and more likely to break (fracture). If you find that you have a low bone mass, you may be able to prevent osteoporosis or further bone loss by changing your diet and lifestyle. How can I find out if my bone mass is low? Bone mass can be measured with an X-ray test that is called a bone mineral density (BMD) test. This test is recommended for all women who are age 65 or older. It may also be recommended for men who are age 70 or older, or for people who are more likely to develop osteoporosis due to:  Having bones that break easily.  Having a long-term disease that weakens bones, such as kidney disease or rheumatoid arthritis.  Having menopause earlier than normal.  Taking medicine that weakens bones, such as steroids, thyroid hormones, or hormone treatment for breast cancer or prostate cancer.  Smoking.  Drinking three or more alcoholic drinks each day.  What are the nutritional recommendations for healthy bones? To have healthy bones, you  need to get enough of the right minerals and vitamins. Most nutrition experts recommend getting these nutrients from the foods that you eat. Nutritional recommendations vary from person to person. Ask your health care provider what is healthy for you. Here are some general guidelines. Calcium Recommendations Calcium is the most important (essential) mineral for bone health. Most people can get enough calcium from their diet, but supplements may be recommended for people who are at risk for osteoporosis. Good sources of calcium include:  Dairy products, such as low-fat or nonfat milk, cheese, and yogurt.  Dark green leafy vegetables, such as bok choy and broccoli.  Calcium-fortified foods, such as orange juice, cereal, bread, soy beverages, and tofu products.  Nuts, such as almonds.  Follow these recommended amounts for daily calcium intake:  Children, age 1?3: 700 mg.  Children, age 4?8: 1,000 mg.  Children, age 9?13: 1,300 mg.  Teens, age 14?18: 1,300 mg.  Adults, age 19?50: 1,000 mg.  Adults, age 51?70: ? Men: 1,000 mg. ? Women: 1,200 mg.  Adults, age 71 or older: 1,200 mg.  Pregnant and breastfeeding females: ? Teens: 1,300 mg. ? Adults: 1,000 mg.  Vitamin D Recommendations Vitamin D is the most essential vitamin for bone health. It helps the body to absorb calcium. Sunlight stimulates the skin to make vitamin D, so be sure to get enough sunlight. If you live in a cold climate or you do not get outside often, your health care provider may recommend that you take vitamin   D supplements. Good sources of vitamin D in your diet include:  Egg yolks.  Saltwater fish.  Milk and cereal fortified with vitamin D.  Follow these recommended amounts for daily vitamin D intake:  Children and teens, age 1?18: 600 international units.  Adults, age 50 or younger: 400-800 international units.  Adults, age 51 or older: 800-1,000 international units.  Other Nutrients Other nutrients  for bone health include:  Phosphorus. This mineral is found in meat, poultry, dairy foods, nuts, and legumes. The recommended daily intake for adult men and adult women is 700 mg.  Magnesium. This mineral is found in seeds, nuts, dark green vegetables, and legumes. The recommended daily intake for adult men is 400?420 mg. For adult women, it is 310?320 mg.  Vitamin K. This vitamin is found in green leafy vegetables. The recommended daily intake is 120 mg for adult men and 90 mg for adult women.  What type of physical activity is best for building and maintaining healthy bones? Weight-bearing and strength-building activities are important for building and maintaining peak bone mass. Weight-bearing activities cause muscles and bones to work against gravity. Strength-building activities increases muscle strength that supports bones. Weight-bearing and muscle-building activities include:  Walking and hiking.  Jogging and running.  Dancing.  Gym exercises.  Lifting weights.  Tennis and racquetball.  Climbing stairs.  Aerobics.  Adults should get at least 30 minutes of moderate physical activity on most days. Children should get at least 60 minutes of moderate physical activity on most days. Ask your health care provide what type of exercise is best for you. Where can I find more information? For more information, check out the following websites:  National Osteoporosis Foundation: http://nof.org/learn/basics  National Institutes of Health: http://www.niams.nih.gov/Health_Info/Bone/Bone_Health/bone_health_for_life.asp  This information is not intended to replace advice given to you by your health care provider. Make sure you discuss any questions you have with your health care provider. Document Released: 12/28/2003 Document Revised: 04/26/2016 Document Reviewed: 10/12/2014 Elsevier Interactive Patient Education  2018 Elsevier Inc.  

## 2018-04-15 NOTE — Telephone Encounter (Signed)
Spoke with patient, per MD may continue anastrozole 2 more years if tolerated well.  Patient voiced understanding with no further questions.  Knows to call center with any issues.

## 2018-04-15 NOTE — Assessment & Plan Note (Addendum)
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  Anastrozole toxicities: Intermittent hot flashes  [name] is doing well today.  She is clinically and radiographically without any sign of recurrence.  I recommended continued annual mammograms.  She has reached her five year point on Anastrozole.  She tolerates the Anastrozole relatively well.  I reviewed that most patients stop after 5 years, however some patients get a very small percentage of benefit to extend by 2 more years.  I asked her if she had a preference.  She told me she had none and wants to know what Dr. Gudena's opinion is.  He was not immediately available in clinic today, so we will call her with the recommendation.    I reviewed bone health with [name] in detail and gave her detailed information in her AVS about this.  We reviewed healthy diet and exercise today.  I recommended continued f/u with her PCP to keep her cancer screenings in place.  She is up to date.    [name] will return in one year for lab and f/u with Dr. Gudena.     

## 2018-04-15 NOTE — Progress Notes (Signed)
[facility] Follow up:    [name], [place] [address] [name] [id number]   DIAGNOSIS: Cancer Staging No matching staging information was found for the patient.  SUMMARY OF ONCOLOGIC HISTORY:   Breast cancer, left breast ([place])   08/25/2012 Initial Diagnosis    Breast cancer, left breast: Invasive ductal carcinoma grade 1-2 ER/PR positive HER-2 negative Ki-67 14%      10/08/2012 Surgery    Left lumpectomy and sentinel lymph node biopsy: 2.1 cm invasive ductal carcinoma 3 sentinel lymph nodes negative posterior margin reexcision ER positive PR positive HER-2 negative Ki-67 14% grade 1-2      11/10/2012 - 12/11/2012 Radiation Therapy    Adjuvant radiation therapy      12/28/2012 -  Anti-estrogen oral therapy    Arimidex 1 mg daily       CURRENT THERAPY:  Anastrozole  INTERVAL HISTORY: [name] 84 y.o. female returns for evaluation of her h/o breast cancer.  She is taking Anastrozole daily and is toelrating it well.  She has not yet undergone her annual mammogram.  It was due 01/2018.  She underwent a boen density in 04/2015 and it showed osteopenia.    She has a PCP, Dr. Criss Rosales that she sees regularly and does a full panel of lab work on her.  She has skin cancer screening with Dr. Criss Rosales.  She has graduated from needing colonoscopy and PAP smears.  She is a former smoker.  She quit 5-10 years ago.  She smoked 2ppd for about 50-60 years.    [name] gets a little bit of exercise.  She walks around the house and she walks up and down the stairs, she says she does this about three times a week, and it typically takes her about 5-10 minutes to complete this duration.     Patient Active Problem List   Diagnosis Date Noted  . Primary osteoarthritis of both hands 01/09/2017  . Primary osteoarthritis of both feet 01/09/2017  . Spondylosis of lumbar region without myelopathy or radiculopathy 01/09/2017  . History of hepatitis 12/05/2016  . Chronic  gout due to renal impairment of multiple sites without tophus 12/05/2016  . History of chronic kidney disease 12/05/2016  . Pancreatitis, acute 04/23/2016  . Essential hypertension 04/23/2016  . Upper abdominal pain 04/22/2016  . Abdominal pain 04/22/2016  . Vasovagal syncope 09/19/2015  . Postprandial nausea 03/08/2015  . Syncope 03/07/2015  . Dyspnea on exertion 03/07/2015  . Blood loss anemia 04/16/2014  . Acute blood loss anemia 04/16/2014  . Melena 04/16/2014  . CKD (chronic kidney disease), stage III ([place]) 03/06/2014  . Nonspecific abnormal finding in stool contents 11/06/2013  . Anemia 11/05/2013  . Renal insufficiency   . Breast cancer, left breast ([place]) 09/01/2012  . Alcohol use 07/06/2012  . Near syncope 07/06/2012  . Hepatitis   . Lipid disorder   . Anxiety   . Hypertension   . Anemia of chronic disease 04/16/2012    has No Known Allergies.  MEDICAL HISTORY: Past Medical History:  Diagnosis Date  . Abdominal pain 04/2016  . Anemia of chronic disease   . Anxiety   . Breast cancer ([place]) 08/28/12   IDC left breast bx=invasive ductal ca,ER/PR=+  . Breast cancer ([place]) 09/01/2012  . Hepatitis   . Hypertension   . Lipid disorder   . RA (rheumatoid arthritis) ([place])   . Radiation 11/12/12-12/15/11   Left Breast/50 Gy  . Renal insufficiency   .  Wears dentures    top  . Wears glasses     SURGICAL HISTORY: Past Surgical History:  Procedure Laterality Date  . ABDOMINAL HYSTERECTOMY      approx 20 years ago  . APPENDECTOMY     20 years ago  . bones spur     removed 20 years ago  . COLONOSCOPY  2009?  . ENTEROSCOPY N/A 11/08/2013   Procedure: ENTEROSCOPY;  Surgeon: Beryle Beams, MD;  Location: [facility] ENDOSCOPY;  Service: Endoscopy;  Laterality: N/A;  . ENTEROSCOPY N/A 04/18/2014   Procedure: ENTEROSCOPY;  Surgeon: Beryle Beams, MD;  Location: [facility] ENDOSCOPY;  Service: Endoscopy;  Laterality: N/A;  . FOOT SURGERY  1992   bone spurs both feet  . PARTIAL MASTECTOMY  WITH NEEDLE LOCALIZATION AND AXILLARY SENTINEL LYMPH NODE BX  10/08/2012   Procedure: PARTIAL MASTECTOMY WITH NEEDLE LOCALIZATION AND AXILLARY SENTINEL LYMPH NODE BX;  Surgeon: Adin Hector, MD;  Location: [place];  Service: General;  Laterality: Left;  Left partial mastectomy with Needle localization and Sentinel lymph node biospy    SOCIAL HISTORY: Social History   Socioeconomic History  . Marital status: Widowed    Spouse name: Not on file  . Number of children: 3  . Years of education: Not on file  . Highest education level: Not on file  Occupational History    Employer: RETIRED  Social Needs  . Financial resource strain: Not on file  . Food insecurity:    Worry: Not on file    Inability: Not on file  . Transportation needs:    Medical: Not on file    Non-medical: Not on file  Tobacco Use  . Smoking status: Former Smoker    Packs/day: 1.00    Types: Cigarettes    Last attempt to quit: 10/27/2007    Years since quitting: 10.4  . Smokeless tobacco: Never Used  . Tobacco comment: started smoking age 87  Substance and Sexual Activity  . Alcohol use: Not Currently  . Drug use: No  . Sexual activity: Not Currently  Lifestyle  . Physical activity:    Days per week: Not on file    Minutes per session: Not on file  . Stress: Not on file  Relationships  . Social connections:    Talks on phone: Not on file    Gets together: Not on file    Attends religious service: Not on file    Active member of club or organization: Not on file    Attends meetings of clubs or organizations: Not on file    Relationship status: Not on file  . Intimate partner violence:    Fear of current or ex partner: Not on file    Emotionally abused: Not on file    Physically abused: Not on file    Forced sexual activity: Not on file  Other Topics Concern  . Not on file  Social History Narrative   Widowed lives with one of her sons   3 sons and 2 daughters   Menses age 80 or  29   Age 55 with first child   No breast feeding   No HRT    FAMILY HISTORY: Family History  Problem Relation Age of Onset  . Cancer Father   . Prostate cancer Father   . Tuberculosis Mother   . Breast cancer Cousin     Review of Systems  Constitutional: Negative for appetite change, chills, fatigue, fever and unexpected weight change.  HENT:   Negative for hearing loss and lump/mass.   Eyes: Negative for eye problems and icterus.  Respiratory: Negative for chest tightness, cough and shortness of breath.   Cardiovascular: Negative for chest pain, leg swelling and palpitations.  Gastrointestinal: Negative for abdominal distention, abdominal pain, constipation, diarrhea, nausea and vomiting.  Endocrine: Negative for hot flashes.  Neurological: Negative for dizziness, extremity weakness, headaches and numbness.  Hematological: Negative for adenopathy. Does not bruise/bleed easily.  Psychiatric/Behavioral: Negative for depression. The patient is not nervous/anxious.       PHYSICAL EXAMINATION  ECOG PERFORMANCE STATUS: 1 - Symptomatic but completely ambulatory  Vitals:   04/15/18 0929  BP: (!) 130/42  Pulse: 85  Resp: 16  Temp: 97.6 F (36.4 C)  SpO2: 100%    Physical Exam  Constitutional: She is oriented to person, place, and time. She appears well-developed and well-nourished.  HENT:  Head: Normocephalic and atraumatic.  Mouth/Throat: Oropharynx is clear and moist. No oropharyngeal exudate.  Eyes: Pupils are equal, round, and reactive to light. No scleral icterus.  Neck: Neck supple.  Cardiovascular: Normal rate, regular rhythm and normal heart sounds.  Pulmonary/Chest: Effort normal and breath sounds normal.  Left breast s/p lumpectomy, no nodularity, no nodules or masses in breast noted, right breast without nodules, masses, skin nipple changes  Abdominal: Soft. Bowel sounds are normal. She exhibits no distension and no mass. There is no tenderness. There is no  rebound and no guarding.  Musculoskeletal: Normal range of motion. She exhibits no tenderness.  Lymphadenopathy:    She has no cervical adenopathy.  Neurological: She is alert and oriented to person, place, and time.  Skin: Skin is warm and dry. Capillary refill takes less than 2 seconds. No rash noted.  Psychiatric: She has a normal mood and affect.    LABORATORY DATA:  CBC    Component Value Date/Time   WBC 5.5 11/04/2017 1006   RBC 4.13 11/04/2017 1006   HGB 13.0 11/04/2017 1006   HGB 12.0 09/19/2015 1056   HCT 37.8 11/04/2017 1006   HCT 36.9 09/19/2015 1056   PLT 229 11/04/2017 1006   PLT 282 09/19/2015 1056   MCV 91.5 11/04/2017 1006   MCV 91.8 09/19/2015 1056   MCH 31.5 11/04/2017 1006   MCHC 34.4 11/04/2017 1006   RDW 13.5 11/04/2017 1006   RDW 14.1 09/19/2015 1056   LYMPHSABS 1,045 11/04/2017 1006   LYMPHSABS 1.2 09/19/2015 1056   MONOABS 760 01/16/2017 1028   MONOABS 0.5 09/19/2015 1056   EOSABS 77 11/04/2017 1006   EOSABS 0.1 09/19/2015 1056   EOSABS 0.2 09/06/2010 0950   BASOSABS 28 11/04/2017 1006   BASOSABS 0.1 09/19/2015 1056    CMP     Component Value Date/Time   NA 140 11/04/2017 1006   NA 140 09/19/2015 1056   K 4.2 11/04/2017 1006   K 4.6 09/19/2015 1056   CL 104 11/04/2017 1006   CL 105 12/28/2012 1059   CO2 29 11/04/2017 1006   CO2 26 09/19/2015 1056   GLUCOSE 84 11/04/2017 1006   GLUCOSE 104 09/19/2015 1056   GLUCOSE 101 (H) 12/28/2012 1059   BUN 14 11/04/2017 1006   BUN 18.3 09/19/2015 1056   CREATININE 1.18 (H) 11/04/2017 1006   CREATININE 1.4 (H) 09/19/2015 1056   CALCIUM 9.9 11/04/2017 1006   CALCIUM 10.2 09/19/2015 1056   PROT 7.6 11/04/2017 1006   PROT 8.2 09/19/2015 1056   ALBUMIN 3.4 (L) 02/07/2017 0812   ALBUMIN 3.6 09/19/2015  1056   AST 22 11/04/2017 1006   AST 34 09/19/2015 1056   ALT 15 11/04/2017 1006   ALT 26 09/19/2015 1056   ALKPHOS 64 02/07/2017 0812   ALKPHOS 101 09/19/2015 1056   BILITOT 0.4 11/04/2017 1006    BILITOT 0.37 09/19/2015 1056   GFRNONAA 43 (L) 11/04/2017 1006   GFRAA 49 (L) 11/04/2017 1006        ASSESSMENT and PLAN:   Breast cancer, left breast ([place]) Left breast invasive ductal carcinoma T2 N0 M0 stage II a 2.1 cm grade 1 ER/PR positive HER-2 negative diagnosed November 2013 status post lumpectomy and radiation currently on oral Arimidex started March 2014.  Anastrozole toxicities: Intermittent hot flashes  Timmy is doing well today.  She is clinically and radiographically without any sign of recurrence.  I recommended continued annual mammograms.  She has reached her five year point on Anastrozole.  She tolerates the Anastrozole relatively well.  I reviewed that most patients stop after 5 years, however some patients get a very small percentage of benefit to extend by 2 more years.  I asked her if she had a preference.  She told me she had none and wants to know what Dr. Geralyn Flash opinion is.  He was not immediately available in clinic today, so we will call her with the recommendation.    I reviewed bone health with Verne in detail and gave her detailed information in her AVS about this.  We reviewed healthy diet and exercise today.  I recommended continued f/u with her PCP to keep her cancer screenings in place.  She is up to date.    Tynasia will return in one year for lab and f/u with Dr. Lindi Adie.       Orders Placed This Encounter  Procedures  . MM DIAG BREAST TOMO BILATERAL    Standing Status:   Future    Standing Expiration Date:   06/16/2019    Order Specific Question:   Reason for Exam (SYMPTOM  OR DIAGNOSIS REQUIRED)    Answer:   breast cancer    Order Specific Question:   Preferred imaging location?    Answer:   [facility]  . DG Bone Density    Standing Status:   Future    Standing Expiration Date:   04/15/2019    Order Specific Question:   Reason for Exam (SYMPTOM  OR DIAGNOSIS REQUIRED)    Answer:   estrogen deficiency    Order Specific Question:    Preferred imaging location?    Answer:   [facility]    All questions were answered. The patient knows to call the clinic with any problems, questions or concerns. We can certainly see the patient much sooner if necessary.  A total of (30) minutes of face-to-face time was spent with this patient with greater than 50% of that time in counseling and care-coordination.  This note was electronically signed. Scot Dock, NP 04/15/2018

## 2018-04-17 ENCOUNTER — Telehealth: Payer: Self-pay | Admitting: Hematology and Oncology

## 2018-04-17 NOTE — Telephone Encounter (Signed)
Scheduled appt per 6/26 sch message - lab and f/u in one year with Dr. Lindi Adie - sent reminder letter in the mail with apt date and time.

## 2018-04-20 ENCOUNTER — Other Ambulatory Visit: Payer: Self-pay | Admitting: Hematology and Oncology

## 2018-04-20 DIAGNOSIS — R110 Nausea: Secondary | ICD-10-CM

## 2018-05-10 ENCOUNTER — Other Ambulatory Visit: Payer: Self-pay | Admitting: Physician Assistant

## 2018-05-11 ENCOUNTER — Other Ambulatory Visit: Payer: Self-pay | Admitting: Physician Assistant

## 2018-05-11 NOTE — Telephone Encounter (Signed)
Last Visit: 02/12/18 Next Visit: 08/19/18 Labs: 11/04/17 stable  Patient advised she is due for labs and will update next week.   Okay to refill 30 dsy supply per Dr. Estanislado Pandy

## 2018-05-17 ENCOUNTER — Other Ambulatory Visit: Payer: Self-pay | Admitting: Hematology and Oncology

## 2018-05-17 DIAGNOSIS — C50212 Malignant neoplasm of upper-inner quadrant of left female breast: Secondary | ICD-10-CM

## 2018-05-18 ENCOUNTER — Other Ambulatory Visit: Payer: Self-pay | Admitting: Hematology and Oncology

## 2018-05-18 DIAGNOSIS — R110 Nausea: Secondary | ICD-10-CM

## 2018-05-19 ENCOUNTER — Other Ambulatory Visit: Payer: Self-pay | Admitting: Hematology and Oncology

## 2018-05-19 DIAGNOSIS — C50212 Malignant neoplasm of upper-inner quadrant of left female breast: Secondary | ICD-10-CM

## 2018-05-21 ENCOUNTER — Other Ambulatory Visit: Payer: Self-pay | Admitting: *Deleted

## 2018-05-21 DIAGNOSIS — C50212 Malignant neoplasm of upper-inner quadrant of left female breast: Secondary | ICD-10-CM

## 2018-05-21 MED ORDER — ANASTROZOLE 1 MG PO TABS: ORAL_TABLET | ORAL | 11 refills | Status: DC

## 2018-06-03 ENCOUNTER — Other Ambulatory Visit: Payer: Medicare Other

## 2018-06-07 ENCOUNTER — Other Ambulatory Visit: Payer: Self-pay | Admitting: Rheumatology

## 2018-06-08 NOTE — Telephone Encounter (Addendum)
Last Visit: 02/12/18 Next Visit: 08/19/18 Labs: 11/04/17 stable  Patient advised she is due for labs. Patient states she will come in next week to update.   Okay to refill 30 day supply per Dr. Estanislado Pandy

## 2018-06-12 ENCOUNTER — Other Ambulatory Visit: Payer: Self-pay | Admitting: Hematology and Oncology

## 2018-06-12 DIAGNOSIS — R110 Nausea: Secondary | ICD-10-CM

## 2018-07-06 ENCOUNTER — Other Ambulatory Visit: Payer: Self-pay | Admitting: Hematology and Oncology

## 2018-07-06 ENCOUNTER — Other Ambulatory Visit: Payer: Self-pay | Admitting: Rheumatology

## 2018-07-06 DIAGNOSIS — R110 Nausea: Secondary | ICD-10-CM

## 2018-07-06 NOTE — Telephone Encounter (Signed)
Last Visit: 02/12/18 Next Visit: 08/19/18 Labs: 11/04/17 stable  Patient is due for labs and verbalized understanding. Will come to update this week.

## 2018-07-08 ENCOUNTER — Other Ambulatory Visit: Payer: Self-pay | Admitting: *Deleted

## 2018-07-08 DIAGNOSIS — M1A39X0 Chronic gout due to renal impairment, multiple sites, without tophus (tophi): Secondary | ICD-10-CM

## 2018-07-08 DIAGNOSIS — Z79899 Other long term (current) drug therapy: Secondary | ICD-10-CM

## 2018-07-08 LAB — CBC WITH DIFFERENTIAL/PLATELET
BASOS ABS: 30 {cells}/uL (ref 0–200)
Basophils Relative: 0.5 %
EOS PCT: 0.7 %
Eosinophils Absolute: 41 cells/uL (ref 15–500)
HCT: 36.2 % (ref 35.0–45.0)
HEMOGLOBIN: 12.4 g/dL (ref 11.7–15.5)
Lymphs Abs: 1074 cells/uL (ref 850–3900)
MCH: 32.4 pg (ref 27.0–33.0)
MCHC: 34.3 g/dL (ref 32.0–36.0)
MCV: 94.5 fL (ref 80.0–100.0)
MONOS PCT: 10.6 %
MPV: 10.8 fL (ref 7.5–12.5)
NEUTROS ABS: 4130 {cells}/uL (ref 1500–7800)
Neutrophils Relative %: 70 %
PLATELETS: 250 10*3/uL (ref 140–400)
RBC: 3.83 10*6/uL (ref 3.80–5.10)
RDW: 13.4 % (ref 11.0–15.0)
TOTAL LYMPHOCYTE: 18.2 %
WBC mixed population: 625 cells/uL (ref 200–950)
WBC: 5.9 10*3/uL (ref 3.8–10.8)

## 2018-07-08 LAB — COMPLETE METABOLIC PANEL WITH GFR
AG RATIO: 1.2 (calc) (ref 1.0–2.5)
ALT: 16 U/L (ref 6–29)
AST: 23 U/L (ref 10–35)
Albumin: 4.3 g/dL (ref 3.6–5.1)
Alkaline phosphatase (APISO): 73 U/L (ref 33–130)
BUN/Creatinine Ratio: 17 (calc) (ref 6–22)
BUN: 23 mg/dL (ref 7–25)
CO2: 23 mmol/L (ref 20–32)
Calcium: 10.5 mg/dL — ABNORMAL HIGH (ref 8.6–10.4)
Chloride: 105 mmol/L (ref 98–110)
Creat: 1.33 mg/dL — ABNORMAL HIGH (ref 0.60–0.88)
GFR, EST AFRICAN AMERICAN: 42 mL/min/{1.73_m2} — AB (ref >=60)
GFR, Est Non African American: 37 mL/min/{1.73_m2} — ABNORMAL LOW (ref >=60)
Globulin: 3.5 g/dL (calc) (ref 1.9–3.7)
Glucose, Bld: 87 mg/dL (ref 65–99)
POTASSIUM: 6.1 mmol/L — AB (ref 3.5–5.3)
SODIUM: 140 mmol/L (ref 135–146)
Total Bilirubin: 0.6 mg/dL (ref 0.2–1.2)
Total Protein: 7.8 g/dL (ref 6.1–8.1)

## 2018-07-08 LAB — URIC ACID: URIC ACID, SERUM: 5.2 mg/dL (ref 2.5–7.0)

## 2018-07-09 ENCOUNTER — Other Ambulatory Visit: Payer: Self-pay | Admitting: Rheumatology

## 2018-07-09 NOTE — Telephone Encounter (Signed)
ok 

## 2018-07-09 NOTE — Telephone Encounter (Signed)
Last Visit: 02/12/18 Next Visit: 08/19/18 Labs: 07/08/18 Creat. 1.33 previously 1.18 GFR 42 previously 49 Uric Acid 5.2  Okay to refill Allopurinol?

## 2018-07-09 NOTE — Progress Notes (Signed)
stable °

## 2018-07-22 DIAGNOSIS — K7469 Other cirrhosis of liver: Secondary | ICD-10-CM | POA: Diagnosis not present

## 2018-07-27 ENCOUNTER — Other Ambulatory Visit: Payer: Self-pay | Admitting: Nurse Practitioner

## 2018-07-27 DIAGNOSIS — K7469 Other cirrhosis of liver: Secondary | ICD-10-CM

## 2018-08-03 ENCOUNTER — Other Ambulatory Visit: Payer: Self-pay | Admitting: Hematology and Oncology

## 2018-08-03 DIAGNOSIS — R110 Nausea: Secondary | ICD-10-CM

## 2018-08-05 ENCOUNTER — Inpatient Hospital Stay: Admission: RE | Admit: 2018-08-05 | Payer: Medicare Other | Source: Ambulatory Visit

## 2018-08-05 NOTE — Progress Notes (Deleted)
Office Visit Note  Patient: [name]             Date of Birth: September 10, 1932           MRN: [id number]             PCP: Lucianne Lei, MD Referring: Lucianne Lei, MD Visit Date: 08/19/2018 Occupation: @GUAROCC @  Subjective:  No chief complaint on file.   History of Present Illness: Kelly BUMGARNER is a 84 y.o. female ***   Activities of Daily Living:  Patient reports morning stiffness for *** {minute/hour:19697}.   Patient {ACTIONS;DENIES/REPORTS:21021675::"Denies"} nocturnal pain.  Difficulty dressing/grooming: {ACTIONS;DENIES/REPORTS:21021675::"Denies"} Difficulty climbing stairs: {ACTIONS;DENIES/REPORTS:21021675::"Denies"} Difficulty getting out of chair: {ACTIONS;DENIES/REPORTS:21021675::"Denies"} Difficulty using hands for taps, buttons, cutlery, and/or writing: {ACTIONS;DENIES/REPORTS:21021675::"Denies"}  No Rheumatology ROS completed.   PMFS History:  Patient Active Problem List   Diagnosis Date Noted  . Primary osteoarthritis of both hands 01/09/2017  . Primary osteoarthritis of both feet 01/09/2017  . Spondylosis of lumbar region without myelopathy or radiculopathy 01/09/2017  . History of hepatitis 12/05/2016  . 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 History of chronic kidney disease 12/05/2016  . Pancreatitis, acute 04/23/2016  . Essential hypertension 04/23/2016  . Upper abdominal pain 04/22/2016  . Abdominal pain 04/22/2016  . Vasovagal syncope 09/19/2015  . Postprandial nausea 03/08/2015  . Syncope 03/07/2015  . Dyspnea on exertion 03/07/2015  . Blood loss anemia 04/16/2014  . Acute blood loss anemia 04/16/2014  . Melena 04/16/2014  . CKD (chronic kidney disease), stage III ([place]) 03/06/2014  . Nonspecific abnormal finding in stool contents 11/06/2013  . Anemia 11/05/2013  . Renal insufficiency   . Breast cancer, left breast ([place]) 09/01/2012  . Alcohol use 07/06/2012  . Near syncope  07/06/2012  . Hepatitis   . Lipid disorder   . Anxiety   . Hypertension   . Anemia of chronic disease 04/16/2012    Past Medical History:  Diagnosis Date  . Abdominal pain 04/2016  . Anemia of chronic disease   . Anxiety   . Breast cancer ([place]) 08/28/12   IDC left breast bx=invasive ductal ca,ER/PR=+  . Breast cancer ([place]) 09/01/2012  . Hepatitis   . Hypertension   . Lipid disorder   . RA (rheumatoid arthritis) ([place])   . Radiation 11/12/12-12/15/11   Left Breast/50 Gy  . Renal insufficiency   . Wears dentures    top  . Wears glasses     Family History  Problem Relation Age of Onset  . Cancer Father   . Prostate cancer Father   . Tuberculosis Mother   . Breast cancer Cousin    Past Surgical History:  Procedure Laterality Date  . ABDOMINAL HYSTERECTOMY      approx 20 years ago  . APPENDECTOMY     20 years ago  . bones spur     removed 20 years ago  . COLONOSCOPY  2009?  . ENTEROSCOPY N/A 11/08/2013   Procedure: ENTEROSCOPY;  Surgeon: Beryle Beams, MD;  Location: [facility] ENDOSCOPY;  Service: Endoscopy;  Laterality: N/A;  . ENTEROSCOPY N/A 04/18/2014   Procedure: ENTEROSCOPY;  Surgeon: Beryle Beams, MD;  Location: [facility] ENDOSCOPY;  Service: Endoscopy;  Laterality: N/A;  . FOOT SURGERY  1992   bone spurs both feet  . PARTIAL MASTECTOMY WITH NEEDLE LOCALIZATION AND AXILLARY SENTINEL LYMPH NODE BX  10/08/2012   Procedure: PARTIAL MASTECTOMY WITH NEEDLE LOCALIZATION AND AXILLARY SENTINEL LYMPH NODE BX;  Surgeon: Renelda Loma  Alyssa Grove, MD;  Location: [place];  Service: General;  Laterality: Left;  Left partial mastectomy with Needle localization and Sentinel lymph node biospy   Social History   Social History Narrative   Widowed lives with one of her sons   3 sons and 2 daughters   Menses age 87 or 13   Age 73 with first child   No breast feeding   No HRT    Objective: Vital Signs: There were no vitals taken for this visit.   Physical Exam    Musculoskeletal Exam: ***  CDAI Exam: CDAI Score: Not documented Patient Global Assessment: Not documented; Provider Global Assessment: Not documented Swollen: Not documented; Tender: Not documented Joint Exam   Not documented   There is currently no information documented on the homunculus. Go to the Rheumatology activity and complete the homunculus joint exam.  Investigation: No additional findings.  Imaging: No results found.  Recent Labs: Lab Results  Component Value Date   WBC 5.9 07/08/2018   HGB 12.4 07/08/2018   PLT 250 07/08/2018   NA 140 07/08/2018   K 6.1 (H) 07/08/2018   CL 105 07/08/2018   CO2 23 07/08/2018   GLUCOSE 87 07/08/2018   BUN 23 07/08/2018   CREATININE 1.33 (H) 07/08/2018   BILITOT 0.6 07/08/2018   ALKPHOS 64 02/07/2017   AST 23 07/08/2018   ALT 16 07/08/2018   PROT 7.8 07/08/2018   ALBUMIN 3.4 (L) 02/07/2017   CALCIUM 10.5 (H) 07/08/2018   GFRAA 42 (L) 07/08/2018    Speciality Comments: No specialty comments available.  Procedures:  No procedures performed Allergies: Patient has no known allergies.   Assessment / Plan:     Visit Diagnoses: No diagnosis found.  Current regimen includes allopurinol 100 mg daily and colchicine 0.6 mg as needed.  07/02/18 uric acid 5.2 and CBC/CMP stable. Orders: No orders of the defined types were placed in this encounter.  No orders of the defined types were placed in this encounter.   Face-to-face time spent with patient was *** minutes. Greater than 50% of time was spent in counseling and coordination of care.  Follow-Up Instructions: No follow-ups on file.   Lilli Dewald C Diamonds Lippard, RPH  Note - This record has been created using [employer].  Chart creation errors have been sought, but may not always  have been located. Such creation errors do not reflect on  the standard of medical care.

## 2018-08-06 ENCOUNTER — Other Ambulatory Visit: Payer: Self-pay | Admitting: Rheumatology

## 2018-08-06 NOTE — Telephone Encounter (Signed)
Last Visit: 02/12/18 Next Visit: 08/19/18 Labs: 07/08/18 Creat. 1.33 previously 1.18 GFR 42 previously 49 Uric Acid 5.2  Okay to refill per Dr. Estanislado Pandy

## 2018-08-19 ENCOUNTER — Ambulatory Visit: Payer: Medicare Other | Admitting: Physician Assistant

## 2018-08-25 ENCOUNTER — Other Ambulatory Visit: Payer: Medicare Other

## 2018-09-03 ENCOUNTER — Other Ambulatory Visit: Payer: Self-pay | Admitting: Rheumatology

## 2018-09-03 NOTE — Telephone Encounter (Signed)
Last Visit: 02/12/18 Next Visit: due in October. Message sent to the front to schedule patient  Labs:07/08/18 Creat. 1.33 previously 1.18 GFR 42 previously 49 Uric Acid 5.2  Okay to refill per Dr. Estanislado Pandy

## 2018-09-03 NOTE — Telephone Encounter (Signed)
Please schedule patient for follow up visit. Patient due October 2019. Thanks!

## 2018-09-08 ENCOUNTER — Other Ambulatory Visit: Payer: Self-pay | Admitting: Rheumatology

## 2018-09-10 DIAGNOSIS — E876 Hypokalemia: Secondary | ICD-10-CM | POA: Diagnosis not present

## 2018-09-10 DIAGNOSIS — R7309 Other abnormal glucose: Secondary | ICD-10-CM | POA: Diagnosis not present

## 2018-09-10 DIAGNOSIS — Z6820 Body mass index (BMI) 20.0-20.9, adult: Secondary | ICD-10-CM | POA: Diagnosis not present

## 2018-09-10 DIAGNOSIS — I10 Essential (primary) hypertension: Secondary | ICD-10-CM | POA: Diagnosis not present

## 2018-09-16 NOTE — Progress Notes (Deleted)
Office Visit Note  Patient: [name]             Date of Birth: Mar 26, 1932           MRN: [id number]             PCP: Lucianne Lei, MD Referring: Lucianne Lei, MD Visit Date: 09/30/2018 Occupation: @GUAROCC @  Subjective:  No chief complaint on file.   History of Present Illness: [name] is a 84 y.o. female ***   Activities of Daily Living:  Patient reports morning stiffness for *** {minute/hour:19697}.   Patient {ACTIONS;DENIES/REPORTS:21021675::"Denies"} nocturnal pain.  Difficulty dressing/grooming: {ACTIONS;DENIES/REPORTS:21021675::"Denies"} Difficulty climbing stairs: {ACTIONS;DENIES/REPORTS:21021675::"Denies"} Difficulty getting out of chair: {ACTIONS;DENIES/REPORTS:21021675::"Denies"} Difficulty using hands for taps, buttons, cutlery, and/or writing: {ACTIONS;DENIES/REPORTS:21021675::"Denies"}  No Rheumatology ROS completed.   PMFS History:  Patient Active Problem List   Diagnosis Date Noted  . Primary osteoarthritis of both hands 01/09/2017  . Primary osteoarthritis of both feet 01/09/2017  . Spondylosis of lumbar region without myelopathy or radiculopathy 01/09/2017  . History of hepatitis 12/05/2016  . 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 History of chronic kidney disease 12/05/2016  . Pancreatitis, acute 04/23/2016  . Essential hypertension 04/23/2016  . Upper abdominal pain 04/22/2016  . Abdominal pain 04/22/2016  . Vasovagal syncope 09/19/2015  . Postprandial nausea 03/08/2015  . Syncope 03/07/2015  . Dyspnea on exertion 03/07/2015  . Blood loss anemia 04/16/2014  . Acute blood loss anemia 04/16/2014  . Melena 04/16/2014  . CKD (chronic kidney disease), stage III ([place]) 03/06/2014  . Nonspecific abnormal finding in stool contents 11/06/2013  . Anemia 11/05/2013  . Renal insufficiency   . Breast cancer, left breast ([place]) 09/01/2012  . Alcohol use 07/06/2012  . Near syncope  07/06/2012  . Hepatitis   . Lipid disorder   . Anxiety   . Hypertension   . Anemia of chronic disease 04/16/2012    Past Medical History:  Diagnosis Date  . Abdominal pain 04/2016  . Anemia of chronic disease   . Anxiety   . Breast cancer ([place]) 08/28/12   IDC left breast bx=invasive ductal ca,ER/PR=+  . Breast cancer ([place]) 09/01/2012  . Hepatitis   . Hypertension   . Lipid disorder   . RA (rheumatoid arthritis) ([place])   . Radiation 11/12/12-12/15/11   Left Breast/50 Gy  . Renal insufficiency   . Wears dentures    top  . Wears glasses     Family History  Problem Relation Age of Onset  . Cancer Father   . Prostate cancer Father   . Tuberculosis Mother   . Breast cancer Cousin    Past Surgical History:  Procedure Laterality Date  . ABDOMINAL HYSTERECTOMY      approx 20 years ago  . APPENDECTOMY     20 years ago  . bones spur     removed 20 years ago  . COLONOSCOPY  2009?  . ENTEROSCOPY N/A 11/08/2013   Procedure: ENTEROSCOPY;  Surgeon: Beryle Beams, MD;  Location: [facility] ENDOSCOPY;  Service: Endoscopy;  Laterality: N/A;  . ENTEROSCOPY N/A 04/18/2014   Procedure: ENTEROSCOPY;  Surgeon: Beryle Beams, MD;  Location: [facility] ENDOSCOPY;  Service: Endoscopy;  Laterality: N/A;  . FOOT SURGERY  1992   bone spurs both feet  . PARTIAL MASTECTOMY WITH NEEDLE LOCALIZATION AND AXILLARY SENTINEL LYMPH NODE BX  10/08/2012   Procedure: PARTIAL MASTECTOMY WITH NEEDLE LOCALIZATION AND AXILLARY SENTINEL LYMPH NODE BX;  Surgeon: Renelda Loma  Alyssa Grove, MD;  Location: [facility];  Service: General;  Laterality: Left;  Left partial mastectomy with Needle localization and Sentinel lymph node biospy   Social History   Social History Narrative   Widowed lives with one of her sons   3 sons and 2 daughters   Menses age 52 or 37   Age 43 with first child   No breast feeding   No HRT    Objective: Vital Signs: There were no vitals taken for this visit.   Physical Exam    Musculoskeletal Exam: ***  CDAI Exam: CDAI Score: Not documented Patient Global Assessment: Not documented; Provider Global Assessment: Not documented Swollen: Not documented; Tender: Not documented Joint Exam   Not documented   There is currently no information documented on the homunculus. Go to the Rheumatology activity and complete the homunculus joint exam.  Investigation: No additional findings.  Imaging: No results found.  Recent Labs: Lab Results  Component Value Date   WBC 5.9 07/08/2018   HGB 12.4 07/08/2018   PLT 250 07/08/2018   NA 140 07/08/2018   K 6.1 (H) 07/08/2018   CL 105 07/08/2018   CO2 23 07/08/2018   GLUCOSE 87 07/08/2018   BUN 23 07/08/2018   CREATININE 1.33 (H) 07/08/2018   BILITOT 0.6 07/08/2018   ALKPHOS 64 02/07/2017   AST 23 07/08/2018   ALT 16 07/08/2018   PROT 7.8 07/08/2018   ALBUMIN 3.4 (L) 02/07/2017   CALCIUM 10.5 (H) 07/08/2018   GFRAA 42 (L) 07/08/2018    Speciality Comments: No specialty comments available.  Procedures:  No procedures performed Allergies: Patient has no known allergies.   Assessment / Plan:     Visit Diagnoses: Chronic gout due to renal impairment of multiple sites without tophus - Allopurinol 100 mg, colchicine 0.6 mg po PRN  Primary osteoarthritis of both hands  Primary osteoarthritis of both feet  Spondylosis of lumbar region without myelopathy or radiculopathy  Essential hypertension  History of chronic kidney disease  History of hepatitis  History of breast cancer  History of pancreatitis  History of hypertension   Orders: No orders of the defined types were placed in this encounter.  No orders of the defined types were placed in this encounter.   Face-to-face time spent with patient was *** minutes. Greater than 50% of time was spent in counseling and coordination of care.  Follow-Up Instructions: No follow-ups on file.   Ofilia Neas, PA-C  Note - This record has been  created using Dragon software.  Chart creation errors have been sought, but may not always  have been located. Such creation errors do not reflect on  the standard of medical care.

## 2018-09-30 ENCOUNTER — Ambulatory Visit: Payer: Medicare Other | Admitting: Physician Assistant

## 2018-10-06 ENCOUNTER — Other Ambulatory Visit: Payer: Self-pay | Admitting: Rheumatology

## 2018-10-08 ENCOUNTER — Telehealth: Payer: Self-pay | Admitting: Rheumatology

## 2018-10-08 MED ORDER — ALLOPURINOL 100 MG PO TABS: ORAL_TABLET | ORAL | 0 refills | Status: DC

## 2018-10-08 NOTE — Telephone Encounter (Signed)
Patient called requesting prescription refill of Allopurinol to be sent to [facility] on [address].

## 2018-10-08 NOTE — Telephone Encounter (Signed)
Last Visit: 02/12/18 Next Visit: 11/12/18 Labs:07/08/18 Creat. 1.33 previously 1.18 GFR 42 previously 49 Uric Acid 5.2  Okay to refill 30 day supply per Dr. Estanislado Pandy

## 2018-10-19 ENCOUNTER — Other Ambulatory Visit: Payer: Self-pay | Admitting: Hematology and Oncology

## 2018-10-19 DIAGNOSIS — R110 Nausea: Secondary | ICD-10-CM

## 2018-10-30 NOTE — Progress Notes (Signed)
Office Visit Note  Patient: [name]             Date of Birth: [birth year]           MRN: [id number]             PCP: Lucianne Lei, MD Referring: Lucianne Lei, MD Visit Date: 11/12/2018 Occupation: @GUAROCC @  Subjective:  Medication monitoring   History of Present Illness: [name] is a 85 y.o. female with history of gout and osteoarthritis.  She is on Allopurinol 100 mg po daily and colchicine 0.6 mg po PRN. She denies any gout flares.  She has not missed any doses of Allopurinol recently.  She has not needed to take colchicine in a long time.  She denies any joint pain or joint swelling.  She will be following up with PCP soon and will discuss scheduling a DEXA.    Activities of Daily Living:  Patient reports morning stiffness for 0  minutes.   Patient Denies nocturnal pain.  Difficulty dressing/grooming: Denies Difficulty climbing stairs: Denies Difficulty getting out of chair: Denies Difficulty using hands for taps, buttons, cutlery, and/or writing: Denies  Review of Systems  Constitutional: Negative for fatigue.  HENT: Negative for mouth sores, mouth dryness and nose dryness.   Eyes: Negative for pain, visual disturbance and dryness.  Respiratory: Negative for cough, hemoptysis, shortness of breath and difficulty breathing.   Cardiovascular: Negative for chest pain, palpitations, hypertension and swelling in legs/feet.  Gastrointestinal: Negative for blood in stool, constipation and diarrhea.  Endocrine: Negative for increased urination.  Genitourinary: Negative for painful urination.  Musculoskeletal: Negative for arthralgias, joint pain, joint swelling, myalgias, muscle weakness, morning stiffness, muscle tenderness and myalgias.  Skin: Negative for color change, pallor, rash, hair loss, nodules/bumps, skin tightness, ulcers and sensitivity to sunlight.  Allergic/Immunologic: Negative for susceptible to infections.  Neurological: Negative for  dizziness, numbness, headaches and weakness.  Hematological: Negative for swollen glands.  Psychiatric/Behavioral: Negative for depressed mood and sleep disturbance. The patient is not nervous/anxious.     PMFS History:  Patient Active Problem List   Diagnosis Date Noted  . Primary osteoarthritis of both hands 01/09/2017  . Primary osteoarthritis of both feet 01/09/2017  . Spondylosis of lumbar region without myelopathy or radiculopathy 01/09/2017  . History of hepatitis 12/05/2016  . 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 History of chronic kidney disease 12/05/2016  . Pancreatitis, acute 04/23/2016  . Essential hypertension 04/23/2016  . Upper abdominal pain 04/22/2016  . Abdominal pain 04/22/2016  . Vasovagal syncope 09/19/2015  . Postprandial nausea 03/08/2015  . Syncope 03/07/2015  . Dyspnea on exertion 03/07/2015  . Blood loss anemia 04/16/2014  . Acute blood loss anemia 04/16/2014  . Melena 04/16/2014  . CKD (chronic kidney disease), stage III ([name]) 03/06/2014  . Nonspecific abnormal finding in stool contents 11/06/2013  . Anemia 11/05/2013  . Renal insufficiency   . Breast cancer, left breast ([name]) 09/01/2012  . Alcohol use 07/06/2012  . Near syncope 07/06/2012  . Hepatitis   . Lipid disorder   . Anxiety   . Hypertension   . Anemia of chronic disease 04/16/2012    Past Medical History:  Diagnosis Date  . Abdominal pain 04/2016  . Anemia of chronic disease   . Anxiety   . Breast cancer ([name]) 08/28/12   IDC left breast bx=invasive ductal ca,ER/PR=+  . Breast cancer ([place]) 09/01/2012  . Hepatitis   . Hypertension   .  Lipid disorder   . RA (rheumatoid arthritis) ([place])   . Radiation 11/12/12-12/15/11   Left Breast/50 Gy  . Renal insufficiency   . Wears dentures    top  . Wears glasses     Family History  Problem Relation Age of Onset  . Cancer Father   . Prostate cancer Father   . Tuberculosis Mother   . Breast  cancer Cousin    Past Surgical History:  Procedure Laterality Date  . ABDOMINAL HYSTERECTOMY      approx 20 years ago  . APPENDECTOMY     20 years ago  . bones spur     removed 20 years ago  . COLONOSCOPY  2009?  . ENTEROSCOPY N/A 11/08/2013   Procedure: ENTEROSCOPY;  Surgeon: Beryle Beams, MD;  Location: [facility] ENDOSCOPY;  Service: Endoscopy;  Laterality: N/A;  . ENTEROSCOPY N/A 04/18/2014   Procedure: ENTEROSCOPY;  Surgeon: Beryle Beams, MD;  Location: [facility] ENDOSCOPY;  Service: Endoscopy;  Laterality: N/A;  . FOOT SURGERY  1992   bone spurs both feet  . PARTIAL MASTECTOMY WITH NEEDLE LOCALIZATION AND AXILLARY SENTINEL LYMPH NODE BX  10/08/2012   Procedure: PARTIAL MASTECTOMY WITH NEEDLE LOCALIZATION AND AXILLARY SENTINEL LYMPH NODE BX;  Surgeon: Adin Hector, MD;  Location: [place];  Service: General;  Laterality: Left;  Left partial mastectomy with Needle localization and Sentinel lymph node biospy   Social History   Social History Narrative   Widowed lives with one of her sons   3 sons and 2 daughters   Menses age 31 or 38   Age 57 with first child   No breast feeding   No HRT    There is no immunization history on file for this patient.   Objective: Vital Signs: BP 124/64 (BP Location: Right Arm, Patient Position: Sitting, Cuff Size: Normal)   Pulse 76   Resp 13   Ht 5\' 2"  (1.575 m)   Wt 121 lb (54.9 kg)   BMI 22.13 kg/m    Physical Exam Vitals signs and nursing note reviewed.  Constitutional:      Appearance: She is well-developed.  HENT:     Head: Normocephalic and atraumatic.  Eyes:     Conjunctiva/sclera: Conjunctivae normal.  Neck:     Musculoskeletal: Normal range of motion.  Cardiovascular:     Rate and Rhythm: Normal rate and regular rhythm.     Heart sounds: Normal heart sounds.  Pulmonary:     Effort: Pulmonary effort is normal.     Breath sounds: Normal breath sounds.  Abdominal:     General: Bowel sounds are normal.      Palpations: Abdomen is soft.  Lymphadenopathy:     Cervical: No cervical adenopathy.  Skin:    General: Skin is warm and dry.     Capillary Refill: Capillary refill takes less than 2 seconds.  Neurological:     Mental Status: She is alert and oriented to person, place, and time.  Psychiatric:        Behavior: Behavior normal.      Musculoskeletal Exam: C-spine and lumbar spine good ROM. Thoracic kyphosis. No midline spinal tenderness.  No SI joint tenderness.  Shoulder joints, elbow joints, wrist joints, MCPs, PIPs, and DIPs good ROM with no synovitis.  PIP and DIP synovial thickening consistent with osteoarthritis.  Hip joints, knee joints, ankle joints, MTPs, PIPs, and DIPs good ROM with no synovitis.  No warmth or effusion of knee joints.  No  tenderness or swelling of ankle joints.  No tenderness over trochanteric bursa bilaterally.   CDAI Exam: CDAI Score: Not documented Patient Global Assessment: Not documented; Provider Global Assessment: Not documented Swollen: Not documented; Tender: Not documented Joint Exam   Not documented   There is currently no information documented on the homunculus. Go to the Rheumatology activity and complete the homunculus joint exam.  Investigation: No additional findings.  Imaging: No results found.  Recent Labs: Lab Results  Component Value Date   WBC 5.9 07/08/2018   HGB 12.4 07/08/2018   PLT 250 07/08/2018   NA 140 07/08/2018   K 6.1 (H) 07/08/2018   CL 105 07/08/2018   CO2 23 07/08/2018   GLUCOSE 87 07/08/2018   BUN 23 07/08/2018   CREATININE 1.33 (H) 07/08/2018   BILITOT 0.6 07/08/2018   ALKPHOS 64 02/07/2017   AST 23 07/08/2018   ALT 16 07/08/2018   PROT 7.8 07/08/2018   ALBUMIN 3.4 (L) 02/07/2017   CALCIUM 10.5 (H) 07/08/2018   GFRAA 42 (L) 07/08/2018    Speciality Comments: No specialty comments available.  Procedures:  No procedures performed Allergies: Patient has no known allergies.   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  She is clinically doing well on allopurinol 100 mg daily and colchicine 0.6 mg as needed.  She has not missed any doses of Allopurinol recently.  She does not need any refills at this time. She has not joint pain or joint swelling at this time.  Uric acid was 5.2 on 07/08/18. We will check uric acid level today. She was advised to notify us if she develops a gout flare.  She will follow up in 6 months.  - Plan: Uric acid  Medication monitoring encounter -CBC and CMP were drawn today to monitor drug toxicity.  Plan: CBC with Differential/Platelet, COMPLETE METABOLIC PANEL WITH GFR   Primary osteoarthritis of both hands: She has PIP and DIP synovial thickening consistent with osteoarthritis of both hands.  She has no synovitis.  She has complete fist formation bilaterally. Joint protection and muscle strengthening were discussed.   Primary osteoarthritis of both feet: She has no discomfort in her feet at this time.   Spondylosis of lumbar region without myelopathy or radiculopathy: She has no midline spinal tenderness.  She has no lower back pain at this time.   Other medical conditions are listed as follows:   Essential hypertension  History of chronic kidney disease  History of hepatitis  History of breast cancer  History of pancreatitis   Orders: Orders Placed This Encounter  Procedures  . Uric acid  . CBC with Differential/Platelet  . COMPLETE METABOLIC PANEL WITH GFR   No orders of the defined types were placed in this encounter.     Follow-Up Instructions: Return in about 6 months (around 05/13/2019) for Gout, Osteoarthritis.   Ofilia Neas, PA-C  Note - This record has been created using Dragon software.  Chart creation errors have been sought, but may not always  have been located. Such creation errors do not reflect on  the standard of  medical care.

## 2018-11-05 ENCOUNTER — Other Ambulatory Visit: Payer: Self-pay | Admitting: Rheumatology

## 2018-11-05 NOTE — Telephone Encounter (Signed)
Last Visit: 02/12/18 Next Visit: 11/12/18 Labs:07/08/18 Creat. 1.33 previously 1.18 GFR 42 previously 49 Uric Acid 5.2  Okay to refill 30 day supply per Dr. Estanislado Pandy

## 2018-11-09 ENCOUNTER — Other Ambulatory Visit: Payer: Self-pay | Admitting: Rheumatology

## 2018-11-12 ENCOUNTER — Ambulatory Visit (INDEPENDENT_AMBULATORY_CARE_PROVIDER_SITE_OTHER): Payer: Medicare Other | Admitting: Physician Assistant

## 2018-11-12 ENCOUNTER — Encounter: Payer: Self-pay | Admitting: Physician Assistant

## 2018-11-12 VITALS — BP 124/64 | HR 76 | Resp 13 | Ht 62.0 in | Wt 121.0 lb

## 2018-11-12 DIAGNOSIS — Z853 Personal history of malignant neoplasm of breast: Secondary | ICD-10-CM

## 2018-11-12 DIAGNOSIS — M1A39X0 Chronic gout due to renal impairment, multiple sites, without tophus (tophi): Secondary | ICD-10-CM

## 2018-11-12 DIAGNOSIS — Z8719 Personal history of other diseases of the digestive system: Secondary | ICD-10-CM

## 2018-11-12 DIAGNOSIS — M19072 Primary osteoarthritis, left ankle and foot: Secondary | ICD-10-CM

## 2018-11-12 DIAGNOSIS — M19041 Primary osteoarthritis, right hand: Secondary | ICD-10-CM | POA: Diagnosis not present

## 2018-11-12 DIAGNOSIS — M19071 Primary osteoarthritis, right ankle and foot: Secondary | ICD-10-CM

## 2018-11-12 DIAGNOSIS — Z87448 Personal history of other diseases of urinary system: Secondary | ICD-10-CM

## 2018-11-12 DIAGNOSIS — Z8619 Personal history of other infectious and parasitic diseases: Secondary | ICD-10-CM

## 2018-11-12 DIAGNOSIS — M47816 Spondylosis without myelopathy or radiculopathy, lumbar region: Secondary | ICD-10-CM | POA: Diagnosis not present

## 2018-11-12 DIAGNOSIS — M19042 Primary osteoarthritis, left hand: Secondary | ICD-10-CM

## 2018-11-12 DIAGNOSIS — I10 Essential (primary) hypertension: Secondary | ICD-10-CM | POA: Diagnosis not present

## 2018-11-12 DIAGNOSIS — Z5181 Encounter for therapeutic drug level monitoring: Secondary | ICD-10-CM

## 2018-11-17 DIAGNOSIS — H2513 Age-related nuclear cataract, bilateral: Secondary | ICD-10-CM | POA: Diagnosis not present

## 2018-11-17 DIAGNOSIS — H25013 Cortical age-related cataract, bilateral: Secondary | ICD-10-CM | POA: Diagnosis not present

## 2018-12-06 ENCOUNTER — Other Ambulatory Visit: Payer: Self-pay | Admitting: Rheumatology

## 2018-12-07 ENCOUNTER — Other Ambulatory Visit: Payer: Self-pay | Admitting: Rheumatology

## 2018-12-07 NOTE — Telephone Encounter (Signed)
Last Visit: 11/12/18 Next Visit: 05/05/19 Labs: 07/08/18 stable  Okay to refill per Dr. Estanislado Pandy

## 2018-12-17 DIAGNOSIS — R7309 Other abnormal glucose: Secondary | ICD-10-CM | POA: Diagnosis not present

## 2018-12-17 DIAGNOSIS — Z0000 Encounter for general adult medical examination without abnormal findings: Secondary | ICD-10-CM | POA: Diagnosis not present

## 2018-12-17 DIAGNOSIS — I10 Essential (primary) hypertension: Secondary | ICD-10-CM | POA: Diagnosis not present

## 2018-12-17 DIAGNOSIS — Z6821 Body mass index (BMI) 21.0-21.9, adult: Secondary | ICD-10-CM | POA: Diagnosis not present

## 2019-01-03 ENCOUNTER — Other Ambulatory Visit: Payer: Self-pay | Admitting: Rheumatology

## 2019-01-04 NOTE — Telephone Encounter (Signed)
Last Visit: 11/12/2018 Next Visit: 05/05/2019 Labs: 07/08/2018 stable  Uric acid: 07/08/2018 5.2  Okay to refill per Dr. Deveshwar.  

## 2019-01-05 ENCOUNTER — Telehealth: Payer: Self-pay | Admitting: Rheumatology

## 2019-01-05 NOTE — Telephone Encounter (Signed)
Patient left a voicemail requesting prescription refill of Allopurinol to be sent to [facility] at [address].  Patient states she only has 3 pills remaining.

## 2019-01-05 NOTE — Telephone Encounter (Signed)
Patient advised prescription was sent to the pharmacy 01/04/19.

## 2019-01-12 ENCOUNTER — Other Ambulatory Visit: Payer: Self-pay | Admitting: Hematology and Oncology

## 2019-01-12 DIAGNOSIS — R110 Nausea: Secondary | ICD-10-CM

## 2019-02-01 ENCOUNTER — Other Ambulatory Visit: Payer: Self-pay | Admitting: Rheumatology

## 2019-02-01 NOTE — Telephone Encounter (Signed)
Last Visit: 11/12/2018 Next Visit: 05/05/2019 Labs: 07/08/2018 stable  Uric acid: 07/08/2018 5.2  Okay to refill per Dr. Estanislado Pandy.

## 2019-02-03 DIAGNOSIS — K7469 Other cirrhosis of liver: Secondary | ICD-10-CM | POA: Diagnosis not present

## 2019-03-02 ENCOUNTER — Telehealth: Payer: Self-pay | Admitting: *Deleted

## 2019-03-02 ENCOUNTER — Other Ambulatory Visit: Payer: Self-pay | Admitting: Rheumatology

## 2019-03-02 MED ORDER — ALLOPURINOL 100 MG PO TABS: 100.0000 mg | ORAL_TABLET | Freq: Every day | ORAL | 0 refills | Status: DC

## 2019-03-02 NOTE — Telephone Encounter (Signed)
Patient states she does not currently need a refill on her Allopurinol as she has plenty at home. Patient will call when she needs a refill. Patient advised she is due for labs and she states she will update soon.

## 2019-03-02 NOTE — Telephone Encounter (Signed)
Patient returned call the office stating she looked at her prescription for Allopurinol and realized she does need the prescription refilled. Patient states she will come to update her labs next week .  Last Visit: 11/12/2018 Next Visit: 05/05/2019 Labs: 07/08/2018 stable   Okay to refill 30 day supply per Dr. Estanislado Pandy

## 2019-03-08 ENCOUNTER — Other Ambulatory Visit: Payer: Self-pay | Admitting: Nurse Practitioner

## 2019-03-08 DIAGNOSIS — K7469 Other cirrhosis of liver: Secondary | ICD-10-CM

## 2019-03-11 ENCOUNTER — Telehealth: Payer: Self-pay | Admitting: Hematology and Oncology

## 2019-03-11 NOTE — Telephone Encounter (Signed)
Call day moved from f/u  6/25 to 6/29. Confirmed with patient. Moved to AM on 6/29 per patient,

## 2019-03-29 ENCOUNTER — Other Ambulatory Visit: Payer: Self-pay | Admitting: Rheumatology

## 2019-03-29 NOTE — Telephone Encounter (Signed)
Ok to refill 30-day supply of Allopurinol.

## 2019-03-29 NOTE — Telephone Encounter (Addendum)
Last Visit:11/12/2018 Next Visit:05/05/2019 Labs:07/08/2018 stable   Patient advised she is due for labs. Patient states she will update labs this week.  Okay to refill 30 days supply Allopurinol?

## 2019-04-04 ENCOUNTER — Other Ambulatory Visit: Payer: Self-pay | Admitting: Hematology and Oncology

## 2019-04-04 DIAGNOSIS — R110 Nausea: Secondary | ICD-10-CM

## 2019-04-05 ENCOUNTER — Other Ambulatory Visit: Payer: Self-pay | Admitting: Hematology and Oncology

## 2019-04-05 DIAGNOSIS — C50212 Malignant neoplasm of upper-inner quadrant of left female breast: Secondary | ICD-10-CM

## 2019-04-13 NOTE — Assessment & Plan Note (Deleted)
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  Anastrozole toxicities: Intermittent hot flashes  Breast Cancer Surveillance: 1. Breast exam 07/09/2016: Scar tissue in the left breast 2. Mammogram   02/06/2017 No abnormalities. Postsurgical changes. Breast Density Category B .  She needs another set of mammograms.   Syncope: No further episodes since last year.  Return to clinic in 1 year for follow-up

## 2019-04-15 ENCOUNTER — Ambulatory Visit: Payer: Medicare Other | Admitting: Hematology and Oncology

## 2019-04-15 ENCOUNTER — Telehealth: Payer: Self-pay | Admitting: *Deleted

## 2019-04-15 ENCOUNTER — Other Ambulatory Visit: Payer: Medicare Other

## 2019-04-15 NOTE — Telephone Encounter (Signed)
Received call from pt stating she wanted to cancel her lab and MD apt with Dr. Lindi Adie on 04/19/2019.  Pt states she will call next week to re schedule with a day and time that works best for her.

## 2019-04-19 ENCOUNTER — Other Ambulatory Visit: Payer: Medicare Other

## 2019-04-19 ENCOUNTER — Ambulatory Visit: Payer: Medicare Other | Admitting: Hematology and Oncology

## 2019-04-21 NOTE — Progress Notes (Deleted)
Office Visit Note  Patient: [name]             Date of Birth: 1932-01-20           MRN: [id number]             PCP: Lucianne Lei, MD Referring: Lucianne Lei, MD Visit Date: 05/05/2019 Occupation: @GUAROCC @  Subjective:  No chief complaint on file.   History of Present Illness: [name] is a 85 y.o. female ***   Activities of Daily Living:  Patient reports morning stiffness for *** {minute/hour:19697}.   Patient {ACTIONS;DENIES/REPORTS:21021675::"Denies"} nocturnal pain.  Difficulty dressing/grooming: {ACTIONS;DENIES/REPORTS:21021675::"Denies"} Difficulty climbing stairs: {ACTIONS;DENIES/REPORTS:21021675::"Denies"} Difficulty getting out of chair: {ACTIONS;DENIES/REPORTS:21021675::"Denies"} Difficulty using hands for taps, buttons, cutlery, and/or writing: {ACTIONS;DENIES/REPORTS:21021675::"Denies"}  No Rheumatology ROS completed.   PMFS History:  Patient Active Problem List   Diagnosis Date Noted  . Primary osteoarthritis of both hands 01/09/2017  . Primary osteoarthritis of both feet 01/09/2017  . Spondylosis of lumbar region without myelopathy or radiculopathy 01/09/2017  . History of hepatitis 12/05/2016  . 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 History of chronic kidney disease 12/05/2016  . Pancreatitis, acute 04/23/2016  . Essential hypertension 04/23/2016  . Upper abdominal pain 04/22/2016  . Abdominal pain 04/22/2016  . Vasovagal syncope 09/19/2015  . Postprandial nausea 03/08/2015  . Syncope 03/07/2015  . Dyspnea on exertion 03/07/2015  . Blood loss anemia 04/16/2014  . Acute blood loss anemia 04/16/2014  . Melena 04/16/2014  . CKD (chronic kidney disease), stage III ([place]) 03/06/2014  . Nonspecific abnormal finding in stool contents 11/06/2013  . Anemia 11/05/2013  . Renal insufficiency   . Breast cancer, left breast ([place]) 09/01/2012  . Alcohol use 07/06/2012  . Near syncope  07/06/2012  . Hepatitis   . Lipid disorder   . Anxiety   . Hypertension   . Anemia of chronic disease 04/16/2012    Past Medical History:  Diagnosis Date  . Abdominal pain 04/2016  . Anemia of chronic disease   . Anxiety   . Breast cancer ([place]) 08/28/12   IDC left breast bx=invasive ductal ca,ER/PR=+  . Breast cancer ([place]) 09/01/2012  . Hepatitis   . Hypertension   . Lipid disorder   . RA (rheumatoid arthritis) ([place])   . Radiation 11/12/12-12/15/11   Left Breast/50 Gy  . Renal insufficiency   . Wears dentures    top  . Wears glasses     Family History  Problem Relation Age of Onset  . Cancer Father   . Prostate cancer Father   . Tuberculosis Mother   . Breast cancer Cousin    Past Surgical History:  Procedure Laterality Date  . ABDOMINAL HYSTERECTOMY      approx 20 years ago  . APPENDECTOMY     20 years ago  . bones spur     removed 20 years ago  . COLONOSCOPY  2009?  . ENTEROSCOPY N/A 11/08/2013   Procedure: ENTEROSCOPY;  Surgeon: Beryle Beams, MD;  Location: [facility] ENDOSCOPY;  Service: Endoscopy;  Laterality: N/A;  . ENTEROSCOPY N/A 04/18/2014   Procedure: ENTEROSCOPY;  Surgeon: Beryle Beams, MD;  Location: [facility] ENDOSCOPY;  Service: Endoscopy;  Laterality: N/A;  . FOOT SURGERY  1992   bone spurs both feet  . PARTIAL MASTECTOMY WITH NEEDLE LOCALIZATION AND AXILLARY SENTINEL LYMPH NODE BX  10/08/2012   Procedure: PARTIAL MASTECTOMY WITH NEEDLE LOCALIZATION AND AXILLARY SENTINEL LYMPH NODE BX;  Surgeon: Renelda Loma  Alyssa Grove, MD;  Location: [place];  Service: General;  Laterality: Left;  Left partial mastectomy with Needle localization and Sentinel lymph node biospy   Social History   Social History Narrative   Widowed lives with one of her sons   3 sons and 2 daughters   Menses age 24 or 41   Age 7 with first child   No breast feeding   No HRT    There is no immunization history on file for this patient.   Objective: Vital Signs: There were no  vitals taken for this visit.   Physical Exam   Musculoskeletal Exam: ***  CDAI Exam: CDAI Score: - Patient Global: -; Provider Global: - Swollen: -; Tender: - Joint Exam   No joint exam has been documented for this visit   There is currently no information documented on the homunculus. Go to the Rheumatology activity and complete the homunculus joint exam.  Investigation: No additional findings.  Imaging: No results found.  Recent Labs: Lab Results  Component Value Date   WBC 5.9 07/08/2018   HGB 12.4 07/08/2018   PLT 250 07/08/2018   NA 140 07/08/2018   K 6.1 (H) 07/08/2018   CL 105 07/08/2018   CO2 23 07/08/2018   GLUCOSE 87 07/08/2018   BUN 23 07/08/2018   CREATININE 1.33 (H) 07/08/2018   BILITOT 0.6 07/08/2018   ALKPHOS 64 02/07/2017   AST 23 07/08/2018   ALT 16 07/08/2018   PROT 7.8 07/08/2018   ALBUMIN 3.4 (L) 02/07/2017   CALCIUM 10.5 (H) 07/08/2018   GFRAA 42 (L) 07/08/2018    Speciality Comments: No specialty comments available.  Procedures:  No procedures performed Allergies: Patient has no known allergies.   Assessment / Plan:     Visit Diagnoses: No diagnosis found.   Orders: No orders of the defined types were placed in this encounter.  No orders of the defined types were placed in this encounter.   Face-to-face time spent with patient was *** minutes. Greater than 50% of time was spent in counseling and coordination of care.  Follow-Up Instructions: No follow-ups on file.   Earnestine Mealing, CMA  Note - This record has been created using Editor, commissioning.  Chart creation errors have been sought, but may not always  have been located. Such creation errors do not reflect on  the standard of medical care.

## 2019-05-02 ENCOUNTER — Other Ambulatory Visit: Payer: Self-pay | Admitting: Rheumatology

## 2019-05-05 ENCOUNTER — Ambulatory Visit: Payer: Self-pay | Admitting: Rheumatology

## 2019-05-10 ENCOUNTER — Telehealth: Payer: Self-pay | Admitting: Rheumatology

## 2019-05-10 ENCOUNTER — Other Ambulatory Visit: Payer: Self-pay | Admitting: Rheumatology

## 2019-05-10 NOTE — Telephone Encounter (Signed)
Advised patient she is due to update labs prior to refill. Last labs on file are from 06/2018. Patient verbalized understanding and will get labs drawn this week. Provided patient with lab hours.

## 2019-05-10 NOTE — Telephone Encounter (Signed)
Patient left a voicemail requesting prescription refill of Allopurinol to be sent to [facility].  Patient states he has no pills remaining.

## 2019-05-16 ENCOUNTER — Other Ambulatory Visit: Payer: Self-pay | Admitting: Rheumatology

## 2019-05-24 DIAGNOSIS — M1A39X0 Chronic gout due to renal impairment, multiple sites, without tophus (tophi): Secondary | ICD-10-CM | POA: Diagnosis not present

## 2019-05-24 DIAGNOSIS — Z5181 Encounter for therapeutic drug level monitoring: Secondary | ICD-10-CM | POA: Diagnosis not present

## 2019-05-25 LAB — COMPLETE METABOLIC PANEL WITH GFR
AG Ratio: 1.1 (calc) (ref 1.0–2.5)
ALT: 10 U/L (ref 6–29)
AST: 21 U/L (ref 10–35)
Albumin: 4.1 g/dL (ref 3.6–5.1)
Alkaline phosphatase (APISO): 76 U/L (ref 37–153)
BUN/Creatinine Ratio: 19 (calc) (ref 6–22)
BUN: 20 mg/dL (ref 7–25)
CO2: 24 mmol/L (ref 20–32)
Calcium: 9.7 mg/dL (ref 8.6–10.4)
Chloride: 102 mmol/L (ref 98–110)
Creat: 1.07 mg/dL — ABNORMAL HIGH (ref 0.60–0.88)
GFR, Est African American: 55 mL/min/{1.73_m2} — ABNORMAL LOW (ref >=60)
GFR, Est Non African American: 47 mL/min/{1.73_m2} — ABNORMAL LOW (ref >=60)
Globulin: 3.8 g/dL (calc) — ABNORMAL HIGH (ref 1.9–3.7)
Glucose, Bld: 101 mg/dL — ABNORMAL HIGH (ref 65–99)
Potassium: 4.8 mmol/L (ref 3.5–5.3)
Sodium: 134 mmol/L — ABNORMAL LOW (ref 135–146)
Total Bilirubin: 0.5 mg/dL (ref 0.2–1.2)
Total Protein: 7.9 g/dL (ref 6.1–8.1)

## 2019-05-25 LAB — CBC WITH DIFFERENTIAL/PLATELET
Absolute Monocytes: 556 cells/uL (ref 200–950)
Basophils Absolute: 32 cells/uL (ref 0–200)
Basophils Relative: 0.6 %
Eosinophils Absolute: 108 cells/uL (ref 15–500)
Eosinophils Relative: 2 %
HCT: 35.5 % (ref 35.0–45.0)
Hemoglobin: 12.1 g/dL (ref 11.7–15.5)
Lymphs Abs: 1031 cells/uL (ref 850–3900)
MCH: 32.4 pg (ref 27.0–33.0)
MCHC: 34.1 g/dL (ref 32.0–36.0)
MCV: 95.2 fL (ref 80.0–100.0)
MPV: 11.4 fL (ref 7.5–12.5)
Monocytes Relative: 10.3 %
Neutro Abs: 3672 cells/uL (ref 1500–7800)
Neutrophils Relative %: 68 %
Platelets: 197 10*3/uL (ref 140–400)
RBC: 3.73 10*6/uL — ABNORMAL LOW (ref 3.80–5.10)
RDW: 12.4 % (ref 11.0–15.0)
Total Lymphocyte: 19.1 %
WBC: 5.4 10*3/uL (ref 3.8–10.8)

## 2019-05-25 LAB — URIC ACID: Uric Acid, Serum: 6.1 mg/dL (ref 2.5–7.0)

## 2019-05-25 NOTE — Progress Notes (Signed)
Uric acid is 6.1.  Ideally, her uric acid should be less than 6.  Please advise patient to follow dietary restrictions.  RBC count is borderline low.  Rest of CBC WNL. Creatinine is mildly elevated at 1.07.  GFR is improving and is 55.  Please advise patient to avoid NSAIDs.  Please forward labs to PCP.  We will continue to monitor.

## 2019-05-26 ENCOUNTER — Other Ambulatory Visit: Payer: Self-pay | Admitting: Rheumatology

## 2019-05-26 NOTE — Telephone Encounter (Signed)
Please schedule patient a follow up visit. Patient was due July 2020. Thanks!

## 2019-05-26 NOTE — Telephone Encounter (Signed)
Called patient to schedule follow-up appointment with Dr. Estanislado Pandy.  Patient states she doesn't want to schedule an appointment at this time and will call back.

## 2019-05-26 NOTE — Telephone Encounter (Signed)
Last Visit: 11/12/18 Next Visit due July 2020. Message sent to the front to schedule patient  Labs: 05/24/19 Uric acid is 6.1. RBC count is borderline low. Rest of CBC WNL. Creatinine is mildly elevated at 1.07. GFR is improving and is 55.  Okay to refill 30 day supply per Dr. Estanislado Pandy

## 2019-06-02 ENCOUNTER — Other Ambulatory Visit: Payer: Medicare Other

## 2019-06-15 ENCOUNTER — Other Ambulatory Visit: Payer: Self-pay | Admitting: Hematology and Oncology

## 2019-06-15 DIAGNOSIS — R110 Nausea: Secondary | ICD-10-CM

## 2019-06-18 ENCOUNTER — Other Ambulatory Visit: Payer: Self-pay | Admitting: Oncology

## 2019-06-18 DIAGNOSIS — C50212 Malignant neoplasm of upper-inner quadrant of left female breast: Secondary | ICD-10-CM

## 2019-06-22 ENCOUNTER — Other Ambulatory Visit: Payer: Self-pay | Admitting: Rheumatology

## 2019-06-22 NOTE — Progress Notes (Deleted)
Office Visit Note  Patient: [name]             Date of Birth: 01-Nov-1931           MRN: [id number]             PCP: Lucianne Lei, MD Referring: Lucianne Lei, MD Visit Date: 06/30/2019 Occupation: @GUAROCC @  Subjective:  No chief complaint on file.   History of Present Illness: [name] is a 85 y.o. female ***   Activities of Daily Living:  Patient reports morning stiffness for *** {minute/hour:19697}.   Patient {ACTIONS;DENIES/REPORTS:21021675::"Denies"} nocturnal pain.  Difficulty dressing/grooming: {ACTIONS;DENIES/REPORTS:21021675::"Denies"} Difficulty climbing stairs: {ACTIONS;DENIES/REPORTS:21021675::"Denies"} Difficulty getting out of chair: {ACTIONS;DENIES/REPORTS:21021675::"Denies"} Difficulty using hands for taps, buttons, cutlery, and/or writing: {ACTIONS;DENIES/REPORTS:21021675::"Denies"}  No Rheumatology ROS completed.   PMFS History:  Patient Active Problem List   Diagnosis Date Noted  . Primary osteoarthritis of both hands 01/09/2017  . Primary osteoarthritis of both feet 01/09/2017  . Spondylosis of lumbar region without myelopathy or radiculopathy 01/09/2017  . History of hepatitis 12/05/2016  . 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 History of chronic kidney disease 12/05/2016  . Pancreatitis, acute 04/23/2016  . Essential hypertension 04/23/2016  . Upper abdominal pain 04/22/2016  . Abdominal pain 04/22/2016  . Vasovagal syncope 09/19/2015  . Postprandial nausea 03/08/2015  . Syncope 03/07/2015  . Dyspnea on exertion 03/07/2015  . Blood loss anemia 04/16/2014  . Acute blood loss anemia 04/16/2014  . Melena 04/16/2014  . CKD (chronic kidney disease), stage III ([place]) 03/06/2014  . Nonspecific abnormal finding in stool contents 11/06/2013  . Anemia 11/05/2013  . Renal insufficiency   . Breast cancer, left breast ([place]) 09/01/2012  . Alcohol use 07/06/2012  . Near syncope  07/06/2012  . Hepatitis   . Lipid disorder   . Anxiety   . Hypertension   . Anemia of chronic disease 04/16/2012    Past Medical History:  Diagnosis Date  . Abdominal pain 04/2016  . Anemia of chronic disease   . Anxiety   . Breast cancer ([place]) 08/28/12   IDC left breast bx=invasive ductal ca,ER/PR=+  . Breast cancer ([place]) 09/01/2012  . Hepatitis   . Hypertension   . Lipid disorder   . RA (rheumatoid arthritis) ([place])   . Radiation 11/12/12-12/15/11   Left Breast/50 Gy  . Renal insufficiency   . Wears dentures    top  . Wears glasses     Family History  Problem Relation Age of Onset  . Cancer Father   . Prostate cancer Father   . Tuberculosis Mother   . Breast cancer Cousin    Past Surgical History:  Procedure Laterality Date  . ABDOMINAL HYSTERECTOMY      approx 20 years ago  . APPENDECTOMY     20 years ago  . bones spur     removed 20 years ago  . COLONOSCOPY  2009?  . ENTEROSCOPY N/A 11/08/2013   Procedure: ENTEROSCOPY;  Surgeon: Beryle Beams, MD;  Location: [facility] ENDOSCOPY;  Service: Endoscopy;  Laterality: N/A;  . ENTEROSCOPY N/A 04/18/2014   Procedure: ENTEROSCOPY;  Surgeon: Beryle Beams, MD;  Location: [facility] ENDOSCOPY;  Service: Endoscopy;  Laterality: N/A;  . FOOT SURGERY  1992   bone spurs both feet  . PARTIAL MASTECTOMY WITH NEEDLE LOCALIZATION AND AXILLARY SENTINEL LYMPH NODE BX  10/08/2012   Procedure: PARTIAL MASTECTOMY WITH NEEDLE LOCALIZATION AND AXILLARY SENTINEL LYMPH NODE BX;  Surgeon: Renelda Loma  Alyssa Grove, MD;  Location: [place];  Service: General;  Laterality: Left;  Left partial mastectomy with Needle localization and Sentinel lymph node biospy   Social History   Social History Narrative   Widowed lives with one of her sons   3 sons and 2 daughters   Menses age 47 or 89   Age 66 with first child   No breast feeding   No HRT    There is no immunization history on file for this patient.   Objective: Vital Signs: There were no  vitals taken for this visit.   Physical Exam   Musculoskeletal Exam: ***  CDAI Exam: CDAI Score: - Patient Global: -; Provider Global: - Swollen: -; Tender: - Joint Exam   No joint exam has been documented for this visit   There is currently no information documented on the homunculus. Go to the Rheumatology activity and complete the homunculus joint exam.  Investigation: No additional findings.  Imaging: No results found.  Recent Labs: Lab Results  Component Value Date   WBC 5.4 05/24/2019   HGB 12.1 05/24/2019   PLT 197 05/24/2019   NA 134 (L) 05/24/2019   K 4.8 05/24/2019   CL 102 05/24/2019   CO2 24 05/24/2019   GLUCOSE 101 (H) 05/24/2019   BUN 20 05/24/2019   CREATININE 1.07 (H) 05/24/2019   BILITOT 0.5 05/24/2019   ALKPHOS 64 02/07/2017   AST 21 05/24/2019   ALT 10 05/24/2019   PROT 7.9 05/24/2019   ALBUMIN 3.4 (L) 02/07/2017   CALCIUM 9.7 05/24/2019   GFRAA 55 (L) 05/24/2019    Speciality Comments: No specialty comments available.  Procedures:  No procedures performed Allergies: Patient has no known allergies.   Assessment / Plan:     Visit Diagnoses: No diagnosis found.  Orders: No orders of the defined types were placed in this encounter.  No orders of the defined types were placed in this encounter.   Face-to-face time spent with patient was *** minutes. Greater than 50% of time was spent in counseling and coordination of care.  Follow-Up Instructions: No follow-ups on file.   Ofilia Neas, PA-C  Note - This record has been created using Dragon software.  Chart creation errors have been sought, but may not always  have been located. Such creation errors do not reflect on  the standard of medical care.

## 2019-06-24 DIAGNOSIS — G250 Essential tremor: Secondary | ICD-10-CM | POA: Diagnosis not present

## 2019-06-24 DIAGNOSIS — M1000 Idiopathic gout, unspecified site: Secondary | ICD-10-CM | POA: Diagnosis not present

## 2019-06-24 DIAGNOSIS — R7309 Other abnormal glucose: Secondary | ICD-10-CM | POA: Diagnosis not present

## 2019-06-24 DIAGNOSIS — I10 Essential (primary) hypertension: Secondary | ICD-10-CM | POA: Diagnosis not present

## 2019-06-29 NOTE — Progress Notes (Deleted)
Office Visit Note  Patient: [name]             Date of Birth: [birth year]           MRN: [id number]             PCP: Lucianne Lei, MD Referring: Lucianne Lei, MD Visit Date: 07/07/2019 Occupation: @GUAROCC @  Subjective:  No chief complaint on file.   History of Present Illness: [name] is a 84 y.o. female ***   Activities of Daily Living:  Patient reports morning stiffness for *** {minute/hour:19697}.   Patient {ACTIONS;DENIES/REPORTS:21021675::"Denies"} nocturnal pain.  Difficulty dressing/grooming: {ACTIONS;DENIES/REPORTS:21021675::"Denies"} Difficulty climbing stairs: {ACTIONS;DENIES/REPORTS:21021675::"Denies"} Difficulty getting out of chair: {ACTIONS;DENIES/REPORTS:21021675::"Denies"} Difficulty using hands for taps, buttons, cutlery, and/or writing: {ACTIONS;DENIES/REPORTS:21021675::"Denies"}  No Rheumatology ROS completed.   PMFS History:  Patient Active Problem List   Diagnosis Date Noted  . Primary osteoarthritis of both hands 01/09/2017  . Primary osteoarthritis of both feet 01/09/2017  . Spondylosis of lumbar region without myelopathy or radiculopathy 01/09/2017  . History of hepatitis 12/05/2016  . 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 History of chronic kidney disease 12/05/2016  . Pancreatitis, acute 04/23/2016  . Essential hypertension 04/23/2016  . Upper abdominal pain 04/22/2016  . Abdominal pain 04/22/2016  . Vasovagal syncope 09/19/2015  . Postprandial nausea 03/08/2015  . Syncope 03/07/2015  . Dyspnea on exertion 03/07/2015  . Blood loss anemia 04/16/2014  . Acute blood loss anemia 04/16/2014  . Melena 04/16/2014  . CKD (chronic kidney disease), stage III ([place]) 03/06/2014  . Nonspecific abnormal finding in stool contents 11/06/2013  . Anemia 11/05/2013  . Renal insufficiency   . Breast cancer, left breast ([place]) 09/01/2012  . Alcohol use 07/06/2012  . Near syncope  07/06/2012  . Hepatitis   . Lipid disorder   . Anxiety   . Hypertension   . Anemia of chronic disease 04/16/2012    Past Medical History:  Diagnosis Date  . Abdominal pain 04/2016  . Anemia of chronic disease   . Anxiety   . Breast cancer ([place]) 08/28/12   IDC left breast bx=invasive ductal ca,ER/PR=+  . Breast cancer ([place]) 09/01/2012  . Hepatitis   . Hypertension   . Lipid disorder   . RA (rheumatoid arthritis) ([place])   . Radiation 11/12/12-12/15/11   Left Breast/50 Gy  . Renal insufficiency   . Wears dentures    top  . Wears glasses     Family History  Problem Relation Age of Onset  . Cancer Father   . Prostate cancer Father   . Tuberculosis Mother   . Breast cancer Cousin    Past Surgical History:  Procedure Laterality Date  . ABDOMINAL HYSTERECTOMY      approx 20 years ago  . APPENDECTOMY     20 years ago  . bones spur     removed 20 years ago  . COLONOSCOPY  2009?  . ENTEROSCOPY N/A 11/08/2013   Procedure: ENTEROSCOPY;  Surgeon: Beryle Beams, MD;  Location[facility] ENDOSCOPY;  Service: Endoscopy;  Laterality: N/A;  . ENTEROSCOPY N/A 04/18/2014   Procedure: ENTEROSCOPY;  Surgeon: Beryle Beams, MD;  Location[facility] ENDOSCOPY;  Service: Endoscopy;  Laterality: N/A;  . FOOT SURGERY  1992   bone spurs both feet  . PARTIAL MASTECTOMY WITH NEEDLE LOCALIZATION AND AXILLARY SENTINEL LYMPH NODE BX  10/08/2012   Procedure: PARTIAL MASTECTOMY WITH NEEDLE LOCALIZATION AND AXILLARY SENTINEL LYMPH NODE BX;  Surgeon: Renelda Loma  Alyssa Grove, MD;  Location: [place];  Service: General;  Laterality: Left;  Left partial mastectomy with Needle localization and Sentinel lymph node biospy   Social History   Social History Narrative   Widowed lives with one of her sons   3 sons and 2 daughters   Menses age 35 or 3   Age 60 with first child   No breast feeding   No HRT    There is no immunization history on file for this patient.   Objective: Vital Signs: There were no  vitals taken for this visit.   Physical Exam   Musculoskeletal Exam: ***  CDAI Exam: CDAI Score: - Patient Global: -; Provider Global: - Swollen: -; Tender: - Joint Exam   No joint exam has been documented for this visit   There is currently no information documented on the homunculus. Go to the Rheumatology activity and complete the homunculus joint exam.  Investigation: No additional findings.  Imaging: No results found.  Recent Labs: Lab Results  Component Value Date   WBC 5.4 05/24/2019   HGB 12.1 05/24/2019   PLT 197 05/24/2019   NA 134 (L) 05/24/2019   K 4.8 05/24/2019   CL 102 05/24/2019   CO2 24 05/24/2019   GLUCOSE 101 (H) 05/24/2019   BUN 20 05/24/2019   CREATININE 1.07 (H) 05/24/2019   BILITOT 0.5 05/24/2019   ALKPHOS 64 02/07/2017   AST 21 05/24/2019   ALT 10 05/24/2019   PROT 7.9 05/24/2019   ALBUMIN 3.4 (L) 02/07/2017   CALCIUM 9.7 05/24/2019   GFRAA 55 (L) 05/24/2019    Speciality Comments: No specialty comments available.  Procedures:  No procedures performed Allergies: Patient has no known allergies.   Assessment / Plan:     Visit Diagnoses: Chronic gout due to renal impairment of multiple sites without tophus  Primary osteoarthritis of both hands  Primary osteoarthritis of both feet  Spondylosis of lumbar region without myelopathy or radiculopathy  Essential hypertension  History of chronic kidney disease  History of hepatitis  History of breast cancer  History of pancreatitis  Orders: No orders of the defined types were placed in this encounter.  No orders of the defined types were placed in this encounter.   Face-to-face time spent with patient was *** minutes. Greater than 50% of time was spent in counseling and coordination of care.  Follow-Up Instructions: No follow-ups on file.   Ofilia Neas, PA-C  Note - This record has been created using Dragon software.  Chart creation errors have been sought, but may not  always  have been located. Such creation errors do not reflect on  the standard of medical care.

## 2019-06-30 ENCOUNTER — Ambulatory Visit: Payer: Medicare Other | Admitting: Physician Assistant

## 2019-07-07 ENCOUNTER — Ambulatory Visit: Payer: Medicare Other | Admitting: Physician Assistant

## 2019-07-07 NOTE — Progress Notes (Signed)
Office Visit Note  Patient: [name]             Date of Birth: 1932-01-04           MRN: [id number]             PCP: Lucianne Lei, MD Referring: Lucianne Lei, MD Visit Date: 07/15/2019 Occupation: @GUAROCC @  Subjective:  Medication monitoring   History of Present Illness: [name] is a 85 y.o. female with history of gout and osteoarthritis.  Patient is on allopurinol 100 mg by mouth daily and takes colchicine 0.6 mg as needed during gout flares.  She denies any recent gout flares.  She denies any joint pain or joint swelling at this time.  She has not had any morning stiffness she denies any lower back pain.  She would like refills of allopurinol and colchicine today.    Activities of Daily Living:  Patient reports morning stiffness for 0 minutes.   Patient Denies nocturnal pain.  Difficulty dressing/grooming: Denies Difficulty climbing stairs: Denies Difficulty getting out of chair: Denies Difficulty using hands for taps, buttons, cutlery, and/or writing: Denies  Review of Systems  Constitutional: Negative for fatigue.  HENT: Negative for mouth sores, mouth dryness and nose dryness.   Eyes: Negative for itching and dryness.  Respiratory: Negative for shortness of breath, wheezing and difficulty breathing.   Cardiovascular: Negative for chest pain and palpitations.  Gastrointestinal: Negative for abdominal pain, blood in stool, constipation and diarrhea.  Endocrine: Negative for increased urination.  Genitourinary: Negative for difficulty urinating and painful urination.  Musculoskeletal: Negative for arthralgias, joint pain, joint swelling and morning stiffness.  Skin: Negative for rash.  Allergic/Immunologic: Negative for susceptible to infections.  Neurological: Negative for dizziness, numbness, headaches, memory loss and weakness.  Hematological: Negative for swollen glands.  Psychiatric/Behavioral: Negative for confusion and sleep disturbance.    PMFS History:  Patient Active Problem List   Diagnosis Date Noted  . Primary osteoarthritis of both hands 01/09/2017  . Primary osteoarthritis of both feet 01/09/2017  . Spondylosis of lumbar region without myelopathy or radiculopathy 01/09/2017  . History of hepatitis 12/05/2016  . 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 History of chronic kidney disease 12/05/2016  . Pancreatitis, acute 04/23/2016  . Essential hypertension 04/23/2016  . Upper abdominal pain 04/22/2016  . Abdominal pain 04/22/2016  . Vasovagal syncope 09/19/2015  . Postprandial nausea 03/08/2015  . Syncope 03/07/2015  . Dyspnea on exertion 03/07/2015  . Blood loss anemia 04/16/2014  . Acute blood loss anemia 04/16/2014  . Melena 04/16/2014  . CKD (chronic kidney disease), stage III ([place]) 03/06/2014  . Nonspecific abnormal finding in stool contents 11/06/2013  . Anemia 11/05/2013  . Renal insufficiency   . Breast cancer, left breast ([place]) 09/01/2012  . Alcohol use 07/06/2012  . Near syncope 07/06/2012  . Hepatitis   . Lipid disorder   . Anxiety   . Hypertension   . Anemia of chronic disease 04/16/2012    Past Medical History:  Diagnosis Date  . Abdominal pain 04/2016  . Anemia of chronic disease   . Anxiety   . Breast cancer ([place]) 08/28/12   IDC left breast bx=invasive ductal ca,ER/PR=+  . Breast cancer ([place]) 09/01/2012  . Hepatitis   . Hypertension   . Lipid disorder   . RA (rheumatoid arthritis) ([place])   . Radiation 11/12/12-12/15/11   Left Breast/50 Gy  . Renal insufficiency   . Wears dentures  top  . Wears glasses     Family History  Problem Relation Age of Onset  . Cancer Father   . Prostate cancer Father   . Tuberculosis Mother   . Breast cancer Cousin    Past Surgical History:  Procedure Laterality Date  . ABDOMINAL HYSTERECTOMY      approx 20 years ago  . APPENDECTOMY     20 years ago  . bones spur     removed 20 years ago  .  COLONOSCOPY  2009?  . ENTEROSCOPY N/A 11/08/2013   Procedure: ENTEROSCOPY;  Surgeon: Beryle Beams, MD;  Location: [facility] ENDOSCOPY;  Service: Endoscopy;  Laterality: N/A;  . ENTEROSCOPY N/A 04/18/2014   Procedure: ENTEROSCOPY;  Surgeon: Beryle Beams, MD;  Location: [facility] ENDOSCOPY;  Service: Endoscopy;  Laterality: N/A;  . FOOT SURGERY  1992   bone spurs both feet  . PARTIAL MASTECTOMY WITH NEEDLE LOCALIZATION AND AXILLARY SENTINEL LYMPH NODE BX  10/08/2012   Procedure: PARTIAL MASTECTOMY WITH NEEDLE LOCALIZATION AND AXILLARY SENTINEL LYMPH NODE BX;  Surgeon: Adin Hector, MD;  Location: [place];  Service: General;  Laterality: Left;  Left partial mastectomy with Needle localization and Sentinel lymph node biospy   Social History   Social History Narrative   Widowed lives with one of her sons   3 sons and 2 daughters   Menses age 45 or 54   Age 88 with first child   No breast feeding   No HRT    There is no immunization history on file for this patient.   Objective: Vital Signs: BP (!) 110/59 (BP Location: Right Arm, Patient Position: Sitting, Cuff Size: Normal)   Pulse 73   Resp 13   Ht 5\' 2"  (1.575 m)   Wt 116 lb 6.4 oz (52.8 kg)   BMI 21.29 kg/m    Physical Exam Vitals signs and nursing note reviewed.  Constitutional:      Appearance: She is well-developed.  HENT:     Head: Normocephalic and atraumatic.  Eyes:     Conjunctiva/sclera: Conjunctivae normal.  Neck:     Musculoskeletal: Normal range of motion.  Cardiovascular:     Rate and Rhythm: Normal rate and regular rhythm.     Heart sounds: Normal heart sounds.  Pulmonary:     Effort: Pulmonary effort is normal.     Breath sounds: Normal breath sounds.  Abdominal:     General: Bowel sounds are normal.     Palpations: Abdomen is soft.  Lymphadenopathy:     Cervical: No cervical adenopathy.  Skin:    General: Skin is warm and dry.     Capillary Refill: Capillary refill takes less than 2  seconds.  Neurological:     Mental Status: She is alert and oriented to person, place, and time.  Psychiatric:        Behavior: Behavior normal.      Musculoskeletal Exam: C-spine good ROM.  Thoracic kyphosis noted.  No midline spinal tenderness.  No SI joint tenderness.  Shoulder joints, elbow joints, wrist joints, MCPs, PIPs, and DIPs good ROM with no synovitis. PIP and DIP synovial thickening consistent with osteoarthritis of both hands. Hip joints, knee joints, ankle joints, MTPs, PIPs, and DIPs good ROM with no synovitis.  No warmth or effusion of knee joints.  No tenderness or swelling of ankle joints.   CDAI Exam: CDAI Score: - Patient Global: -; Provider Global: - Swollen: -; Tender: - Joint Exam  No joint exam has been documented for this visit   There is currently no information documented on the homunculus. Go to the Rheumatology activity and complete the homunculus joint exam.  Investigation: No additional findings.  Imaging: No results found.  Recent Labs: Lab Results  Component Value Date   WBC 5.4 05/24/2019   HGB 12.1 05/24/2019   PLT 197 05/24/2019   NA 134 (L) 05/24/2019   K 4.8 05/24/2019   CL 102 05/24/2019   CO2 24 05/24/2019   GLUCOSE 101 (H) 05/24/2019   BUN 20 05/24/2019   CREATININE 1.07 (H) 05/24/2019   BILITOT 0.5 05/24/2019   ALKPHOS 64 02/07/2017   AST 21 05/24/2019   ALT 10 05/24/2019   PROT 7.9 05/24/2019   ALBUMIN 3.4 (L) 02/07/2017   CALCIUM 9.7 05/24/2019   GFRAA 55 (L) 05/24/2019    Speciality Comments: No specialty comments available.  Procedures:  No procedures performed Allergies: Patient has no known allergies.   Assessment / Plan:     Visit Diagnoses: Chronic gout due to renal impairment of multiple sites without tophus -She has not had any recent gout flares.  She is clinically doing well on allopurinol 100 mg 1 tablet by mouth daily and colchicine 0.6 mg as needed.  She has not needed to take colchicine recently.  She  requested refills of allopurinol and colchicine.  She has no joint pain, joint swelling, or joint stiffness at this time.  Her uric acid was 6.1 on 05/24/2019. We discussed the importance of avoiding purine rich foods.  She is advised to notify us if she develop signs or symptoms of a gout flare.  She will continue taking allopurinol and colchicine as prescribed.  She will follow-up in the office in 6 months.    Primary osteoarthritis of both hands: She has PIP and DIP synovial thickening consistent with osteoarthritis of both hands.  She has no synovitis or tenderness on exam today.  She does not experience any morning stiffness.  She has no difficulties with ADLs.  Joint protection and muscle strengthening were discussed.  Primary osteoarthritis of both feet: She has no feet pain or tenderness at this time.  She wears proper fitting shoes.  Spondylosis of lumbar region without myelopathy or radiculopathy: She has good range of motion with no discomfort.  No midline spinal tenderness.  She has not had any lower back pain recently.  She denies any symptoms of radiculopathy.  Other medical conditions are listed as follows:   Essential hypertension  History of chronic kidney disease  History of hepatitis  History of breast cancer  History of pancreatitis  Orders: No orders of the defined types were placed in this encounter.  Meds ordered this encounter  Medications  . allopurinol (ZYLOPRIM) 100 MG tablet    Sig: Take 1 tablet (100 mg total) by mouth daily.    Dispense:  90 tablet    Refill:  0  . colchicine 0.6 MG tablet    Sig: Take 1 tablet (0.6 mg total) by mouth daily as needed.    Dispense:  30 tablet    Refill:  2     Follow-Up Instructions: Return in about 6 months (around 01/12/2020) for Gout, Osteoarthritis.   Ofilia Neas, PA-C  Note - This record has been created using Dragon software.  Chart creation errors have been sought, but may not always  have been located.  Such creation errors do not reflect on  the standard of medical care.

## 2019-07-15 ENCOUNTER — Encounter: Payer: Self-pay | Admitting: Physician Assistant

## 2019-07-15 ENCOUNTER — Ambulatory Visit (INDEPENDENT_AMBULATORY_CARE_PROVIDER_SITE_OTHER): Payer: Medicare Other | Admitting: Physician Assistant

## 2019-07-15 ENCOUNTER — Other Ambulatory Visit: Payer: Self-pay

## 2019-07-15 VITALS — BP 110/59 | HR 73 | Resp 13 | Ht 62.0 in | Wt 116.4 lb

## 2019-07-15 DIAGNOSIS — Z87448 Personal history of other diseases of urinary system: Secondary | ICD-10-CM

## 2019-07-15 DIAGNOSIS — M1A39X0 Chronic gout due to renal impairment, multiple sites, without tophus (tophi): Secondary | ICD-10-CM

## 2019-07-15 DIAGNOSIS — M19072 Primary osteoarthritis, left ankle and foot: Secondary | ICD-10-CM

## 2019-07-15 DIAGNOSIS — M19071 Primary osteoarthritis, right ankle and foot: Secondary | ICD-10-CM | POA: Diagnosis not present

## 2019-07-15 DIAGNOSIS — I10 Essential (primary) hypertension: Secondary | ICD-10-CM

## 2019-07-15 DIAGNOSIS — M47816 Spondylosis without myelopathy or radiculopathy, lumbar region: Secondary | ICD-10-CM

## 2019-07-15 DIAGNOSIS — M19042 Primary osteoarthritis, left hand: Secondary | ICD-10-CM

## 2019-07-15 DIAGNOSIS — Z8719 Personal history of other diseases of the digestive system: Secondary | ICD-10-CM

## 2019-07-15 DIAGNOSIS — M19041 Primary osteoarthritis, right hand: Secondary | ICD-10-CM | POA: Diagnosis not present

## 2019-07-15 DIAGNOSIS — Z853 Personal history of malignant neoplasm of breast: Secondary | ICD-10-CM

## 2019-07-15 DIAGNOSIS — Z8619 Personal history of other infectious and parasitic diseases: Secondary | ICD-10-CM

## 2019-07-15 MED ORDER — ALLOPURINOL 100 MG PO TABS: 100.0000 mg | ORAL_TABLET | Freq: Every day | ORAL | 0 refills | Status: DC

## 2019-07-15 MED ORDER — COLCHICINE 0.6 MG PO TABS: 0.6000 mg | ORAL_TABLET | Freq: Every day | ORAL | 2 refills | Status: DC | PRN

## 2019-08-26 ENCOUNTER — Other Ambulatory Visit: Payer: Self-pay

## 2019-08-26 DIAGNOSIS — R110 Nausea: Secondary | ICD-10-CM

## 2019-08-26 MED ORDER — METOCLOPRAMIDE HCL 5 MG PO TABS: ORAL_TABLET | ORAL | 0 refills | Status: DC

## 2019-09-17 ENCOUNTER — Other Ambulatory Visit: Payer: Self-pay | Admitting: *Deleted

## 2019-09-17 DIAGNOSIS — R110 Nausea: Secondary | ICD-10-CM

## 2019-09-17 MED ORDER — METOCLOPRAMIDE HCL 5 MG PO TABS: ORAL_TABLET | ORAL | 0 refills | Status: DC

## 2019-09-30 DIAGNOSIS — K7469 Other cirrhosis of liver: Secondary | ICD-10-CM | POA: Diagnosis not present

## 2019-10-11 ENCOUNTER — Telehealth: Payer: Self-pay | Admitting: Hematology and Oncology

## 2019-10-11 ENCOUNTER — Telehealth: Payer: Self-pay | Admitting: Rheumatology

## 2019-10-11 MED ORDER — ALLOPURINOL 100 MG PO TABS: 100.0000 mg | ORAL_TABLET | Freq: Every day | ORAL | 0 refills | Status: DC

## 2019-10-11 NOTE — Telephone Encounter (Signed)
Scheduled appt per 12/21 sch message - pt is aware of appt date and time   

## 2019-10-11 NOTE — Telephone Encounter (Signed)
Last Visit: 07/15/2019 Next Visit: 01/12/2020 Labs: 05/24/2019 Uric acid is 6.1. Ideally, her uric acid should be less than 6. RBC count is borderline low. Rest of CBC WNL. Creatinine is mildly elevated at 1.07. GFR is improving and is 55. We will continue to monitor.   Okay to refill per Dr. Estanislado Pandy.

## 2019-10-11 NOTE — Telephone Encounter (Signed)
Patient called requesting prescription refill of Allopurinol to be sent to [facility] at [place].  Patient states she only has one pill remaining.

## 2019-10-12 ENCOUNTER — Other Ambulatory Visit: Payer: Self-pay | Admitting: *Deleted

## 2019-10-12 DIAGNOSIS — R110 Nausea: Secondary | ICD-10-CM

## 2019-10-12 MED ORDER — METOCLOPRAMIDE HCL 5 MG PO TABS: ORAL_TABLET | ORAL | 0 refills | Status: DC

## 2019-10-13 ENCOUNTER — Other Ambulatory Visit: Payer: Self-pay

## 2019-10-13 DIAGNOSIS — C50212 Malignant neoplasm of upper-inner quadrant of left female breast: Secondary | ICD-10-CM

## 2019-10-13 MED ORDER — ANASTROZOLE 1 MG PO TABS: ORAL_TABLET | ORAL | 0 refills | Status: DC

## 2019-10-25 ENCOUNTER — Other Ambulatory Visit: Payer: Self-pay | Admitting: Family Medicine

## 2019-10-25 ENCOUNTER — Other Ambulatory Visit: Payer: Self-pay

## 2019-10-25 DIAGNOSIS — R109 Unspecified abdominal pain: Secondary | ICD-10-CM

## 2019-10-25 DIAGNOSIS — K921 Melena: Secondary | ICD-10-CM | POA: Diagnosis not present

## 2019-10-25 DIAGNOSIS — I10 Essential (primary) hypertension: Secondary | ICD-10-CM | POA: Diagnosis not present

## 2019-10-25 DIAGNOSIS — R634 Abnormal weight loss: Secondary | ICD-10-CM

## 2019-10-25 DIAGNOSIS — C50912 Malignant neoplasm of unspecified site of left female breast: Secondary | ICD-10-CM

## 2019-10-26 ENCOUNTER — Inpatient Hospital Stay: Payer: Medicare Other | Attending: Hematology and Oncology | Admitting: Hematology and Oncology

## 2019-10-26 ENCOUNTER — Inpatient Hospital Stay: Payer: Medicare Other

## 2019-10-26 NOTE — Assessment & Plan Note (Deleted)
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  Anastrozole toxicities: Intermittent hot flashes Duration of therapy: Patient will finish 7 years of therapy by March 2021. I recommended continuation of therapy until that time.  Breast cancer surveillance: 1.  Breast exam 10/26/2019: Benign 2.  Mammogram: Has not been done since 2018 Ultrasound of the abdomen: Cirrhosis of the liver Patient has appointment for CT of the abdomen tomorrow  Return to clinic in 1 year for follow-up.  After that she can be followed by her primary care physician.

## 2019-10-27 ENCOUNTER — Ambulatory Visit
Admission: RE | Admit: 2019-10-27 | Discharge: 2019-10-27 | Disposition: A | Discharge status: Home / Self Care | Payer: Medicare Other | Source: Ambulatory Visit | Attending: Family Medicine | Admitting: Family Medicine

## 2019-10-27 DIAGNOSIS — R109 Unspecified abdominal pain: Secondary | ICD-10-CM

## 2019-10-27 DIAGNOSIS — R634 Abnormal weight loss: Secondary | ICD-10-CM

## 2019-10-27 DIAGNOSIS — N281 Cyst of kidney, acquired: Secondary | ICD-10-CM | POA: Diagnosis not present

## 2019-10-27 DIAGNOSIS — K7689 Other specified diseases of liver: Secondary | ICD-10-CM | POA: Diagnosis not present

## 2019-10-27 DIAGNOSIS — K6389 Other specified diseases of intestine: Secondary | ICD-10-CM | POA: Diagnosis not present

## 2019-10-27 MED ORDER — IOPAMIDOL (ISOVUE-300) INJECTION 61%
100.0000 mL | Freq: Once | INTRAVENOUS | Status: AC | PRN
  Administered 2019-10-27: 09:00:00 100 mL via INTRAVENOUS

## 2019-11-02 DIAGNOSIS — R109 Unspecified abdominal pain: Secondary | ICD-10-CM | POA: Diagnosis not present

## 2019-11-02 DIAGNOSIS — R634 Abnormal weight loss: Secondary | ICD-10-CM | POA: Diagnosis not present

## 2019-11-02 DIAGNOSIS — K8689 Other specified diseases of pancreas: Secondary | ICD-10-CM | POA: Diagnosis not present

## 2019-11-02 DIAGNOSIS — K869 Disease of pancreas, unspecified: Secondary | ICD-10-CM | POA: Diagnosis not present

## 2019-11-02 DIAGNOSIS — K921 Melena: Secondary | ICD-10-CM | POA: Diagnosis not present

## 2019-11-03 ENCOUNTER — Inpatient Hospital Stay: Admission: RE | Admit: 2019-11-03 | Payer: Medicare Other | Source: Ambulatory Visit

## 2019-11-09 ENCOUNTER — Other Ambulatory Visit: Payer: Self-pay | Admitting: Hematology and Oncology

## 2019-11-09 DIAGNOSIS — R110 Nausea: Secondary | ICD-10-CM

## 2019-11-11 ENCOUNTER — Other Ambulatory Visit: Payer: Self-pay | Admitting: *Deleted

## 2019-11-11 DIAGNOSIS — C50212 Malignant neoplasm of upper-inner quadrant of left female breast: Secondary | ICD-10-CM

## 2019-11-11 MED ORDER — ANASTROZOLE 1 MG PO TABS: ORAL_TABLET | ORAL | 0 refills | Status: DC

## 2019-11-12 ENCOUNTER — Telehealth: Payer: Self-pay | Admitting: Hematology and Oncology

## 2019-11-12 NOTE — Telephone Encounter (Signed)
Scheduled per 1/21 sch msg. Called and spoke with pt, confirmed 2/23 appt

## 2019-11-14 NOTE — Progress Notes (Addendum)
11/14/2019 DULCEMARIA FAN [id number] October 01, 1932  Chief Complaint: Decreased appetite   HISTORY OF PRESENT ILLNESS:  Israela Kelting. Lapietra is an 86 year old female with a past medical history of hypertension, breast cancer 2013 s/p left breast mastectomy, radiation on Arimidex, anemia of chronic disease, CKD stage III, gout, osteoarthritis, small bowel AVMs, colon polyps, chronic hepatitis C GT1b with cirrhosis treated with Harvoni 05/2016 x 8 weeks with SVR. She is followed by [facility] at [facility] liver care, last virtual visit was 01/2019. She presents today for further evaluation for a decreased appetite and weight loss. She developed lower abdominal pain 2 weeks ago, previously felt well. She described the pain as a sharp stabbing pain which radiated across her lower abdomen. No nausea or vomiting. No dysphagia or heartburn. She reports losing 17lbs over the past 2 to 3 weeks. She weighs 97lbs today, prior weight 115 lbs. She denies having any fever, sweats or chills. She is drinking 1 can of boost daily. She is taking small bites of solid food 2 to 3 times daily. No abdominal pain when eating. She typically passes a normal formed brown stool daily, however, for the past 2 weeks she is passing 1 to 2 loose black stools daily. No rectal bleeding. She previously took Ferrous Sulfate 325mg  daily but stopped taking it about 6 months ago. No Pepto bismal use. No NSAID use. She drinks 2 cans of beers every evening which she stopped 4 weeks ago. No drug use.  She is a non-smoker.  Father with history of prostate cancer.  No known family history of liver, pancreatic or colorectal cancer.  She was seen by Dr. Criss Rosales 3 weeks ago due to having abdominal pain.  An abdominal/pelvic CT with contrast 10/27/2019 which identified multiple hepatic masses, a mass lesion associated with the spleen and pancreas, pancreatic tail tracking in the splenic hilum, splenic infarct, moderate cecal distention of approximately 6  cm, gradual transition of the colon with presumed serosal disease at the splenic flexure and signs of perisplenic implants. There is a pericolonic soft tissue implant measuring 2.6 cm near the splenic flexure.  These findings are concerning for a primary pancreatic neoplasm with metastasis versus an abdominal metastatic disease from breast cancer.  I discussed the results of her CAT scan with the patient and her son.  She did not previously understand the significance of these findings which are concerning for a cancerous process.  Baseline labs 05/24/2019: WBC 5.4.  Hemoglobin 12.1.  Hematocrit 35.5.  Platelet 197.  Glucose 101.  BUN 20.  Creatinine 1.07.  AST 21.ALT 10.  Abdominal/pelvic CT 10/27/2019: 1. Large mass in the splenic hilum and pancreatic tail, associated with masses in the liver and in the peritoneum and likely with colonic serosal disease at the splenic flexure. Findings may represent a primary pancreatic neoplasm with metastatic disease versus abdominal metastatic disease from breast cancer or similar process. 2. No overt signs of obstruction though there is some mild to moderate distension of the cecum and evidence of serosal disease at the splenic flexure of the colon. Correlate with any history that would suggest developing obstruction and attention on follow-up. 3. Left adrenal nodularity is suspected though left adrenal is not clearly seen on today's study. 4. Atherosclerosis and emphysema. 5. A call is out to the referring provider to further discuss findings in the above case.  Colonoscopy 06/16/2013 by Dr. Juanita Craver: -Two dimunitive polyps in distal ascending colon and in the cecum.  -  One 6-73mm polyp in the proximal ascending colon. -The examined portion of the ileum was normal.  -The examination was otherwise normal on direct and retroflexion views.   Small bowel enteroscopy by Dr. Benson Norway 11/08/2013: 1. Nonbleeding gastric AVMs. 2. Nonbleeding duodenal and proximal  jejunal AVMs.     AVMs were cauterized with APC.   Small bowel enteroscopy 04/18/2014:  Small bowel AVM. One was noted to have fresh blood. All AVMs were cauterized with APC.  Nonbleeding antral AVM.   Past Medical History:  Diagnosis Date  . Abdominal pain 04/2016  . Anemia of chronic disease   . Anxiety   . Breast cancer ([place]) 08/28/12   IDC left breast bx=invasive ductal ca,ER/PR=+  . Breast cancer ([place]) 09/01/2012  . Hepatitis   . Hypertension   . Lipid disorder   . RA (rheumatoid arthritis) ([place])   . Radiation 11/12/12-12/15/11   Left Breast/50 Gy  . Renal insufficiency   . Wears dentures    top  . Wears glasses    Past Surgical History:  Procedure Laterality Date  . ABDOMINAL HYSTERECTOMY      approx 20 years ago  . APPENDECTOMY     20 years ago  . bones spur     removed 20 years ago  . COLONOSCOPY  2009?  . ENTEROSCOPY N/A 11/08/2013   Procedure: ENTEROSCOPY;  Surgeon: Beryle Beams, MD;  Location: [facility] ENDOSCOPY;  Service: Endoscopy;  Laterality: N/A;  . ENTEROSCOPY N/A 04/18/2014   Procedure: ENTEROSCOPY;  Surgeon: Beryle Beams, MD;  Location: [facility] ENDOSCOPY;  Service: Endoscopy;  Laterality: N/A;  . FOOT SURGERY  1992   bone spurs both feet  . PARTIAL MASTECTOMY WITH NEEDLE LOCALIZATION AND AXILLARY SENTINEL LYMPH NODE BX  10/08/2012   Procedure: PARTIAL MASTECTOMY WITH NEEDLE LOCALIZATION AND AXILLARY SENTINEL LYMPH NODE BX;  Surgeon: Adin Hector, MD;  Location: [place];  Service: General;  Laterality: Left;  Left partial mastectomy with Needle localization and Sentinel lymph node biospy    reports that she quit smoking about 12 years ago. Her smoking use included cigarettes. She smoked 1.00 pack per day. She has never used smokeless tobacco. She reports previous alcohol use. She reports that she does not use drugs. family history includes Breast cancer in her cousin; Cancer in her father; Prostate cancer in her father; Tuberculosis in her  mother. No Known Allergies    Outpatient Encounter Medications as of 11/16/2019  Medication Sig  . allopurinol (ZYLOPRIM) 100 MG tablet Take 1 tablet (100 mg total) by mouth daily.  [name] amLODipine (NORVASC) 5 MG tablet TK 1 T PO QD  . anastrozole (ARIMIDEX) 1 MG tablet Take 1 tablet by mouth daily.  . Cholecalciferol (VITAMIN D-3) 1000 UNITS CAPS Take 1,000 Units by mouth daily.  . colchicine 0.6 MG tablet Take 1 tablet (0.6 mg total) by mouth daily as needed.  . ferrous sulfate 325 (65 FE) MG EC tablet Take 325 mg by mouth daily with breakfast.  . folic acid (FOLVITE) A999333 MCG tablet Take 400-800 mcg by mouth daily.  . irbesartan (AVAPRO) 150 MG tablet TK 1 T PO QD  . metoCLOPramide (REGLAN) 5 MG tablet Take 1 tablet by mouth TID as needed for nausea and vomiting.  Take 15 to 20 minutes before a meal.  . Omega-3 Fatty Acids (FISH OIL) 500 MG CAPS Take 1 capsule by mouth daily.  [name] omeprazole (PRILOSEC) 20 MG capsule Take 20 mg by mouth daily.  . vitamin  C (ASCORBIC ACID) 500 MG tablet Take 500 mg by mouth daily.   No facility-administered encounter medications on file as of 11/16/2019.     REVIEW OF SYSTEMS  : All other systems reviewed and negative except where noted in the History of Present Illness.   PHYSICAL EXAM: BP (!) 78/55   Pulse 64   Temp (!) 97.2 F (36.2 C)   Ht 5\' 2"  (1.575 m)   Wt 97 lb 6.4 oz (44.2 kg)   BMI 17.81 kg/m  General: Well developed 86 year old female in no acute distress. Head: Normocephalic and atraumatic. Eyes:  Sclerae non-icteric, conjunctive pink. Ears: Normal auditory acuity. Mouth: Absent dentition. No ulcers or lesions.  Neck: Supple, no lymphadenopathy or thyromegaly.  Lungs: Clear bilaterally to auscultation without wheezes, crackles or rhonchi. Heart: Regular rate and rhythm. No murmur, rub or gallop appreciated.  Abdomen: Soft, nontender, non distended. No masses. No hepatosplenomegaly. Normoactive bowel sounds x 4 quadrants.  Rectal:   Soft golden brown stool in the rectal vault, guaiac negative.  Musculoskeletal: Symmetrical with no gross deformities. Skin: Warm and dry. No rash or lesions on visible extremities. Extremities: No edema. Neurological: Alert oriented x 4, no focal deficits.  Psychological:  Alert and cooperative. Normal mood and affect.  ASSESSMENT AND PLAN:  1. Hind C. Ambrocio is an 86 year old female with a past history of breast cancer in 2013 who presents today for further evaluation regarding a decreased appetite and weight loss. An abd/pelvic CT 10/27/2019 identified multiple hepatic masses, largest in the left hepatic lobe measures is 4.0 x 3.6 cm, a large mass in the splenic hilum and pancreatic tail concerning for primary pancreatic neoplasm with metastatic disease. Spleen with signs of splenic infarct likely due to vascular compromise at the level of the splenic hilum due to large mass. -CBC, CMP,  PT/INR, AFP, CA 19-9, Iron panel -Await the above lab results, patient will most likely require hospital admission for expedited pancreas and liver biospy -Dr. Rush Landmark to review abd/pelvic CT to consider performing an EUS with biopsy of the pancreatic mass and liver biopsy as well -Boost 2 to 3 cans daily -Oncology consult   2. History of chronic hepatitis C GT1b treated with Harvoni x 8 weeks in 2017 with SVR  3. Chronic anemia. Baseline Hg 12.1 - 12.4.  4. CKD  Base line creatinine 1.07 05/2019   5. Melena as reported by the patient. Rectal exam today stool was golden brown, guaiac negative. -Eventual EGD -Omeprazole 40mg  1 po QD  6. Hypotension -Stat CBC, CMP  -Push fluids  -Hold BP meds, patient to contact PCP for further BP management   7. History of colon polyps -eventual colonoscopy, await pancreatic/liver biopsy results   ADDENDUM: The patient's lab results today identified WBC 20.2.  Hemoglobin 11.1.  Potassium 5.4.  Creatinine 1.34.  INR 1.5. She was sent to [facility]  emergency room earlier today for further evaluation to include a repeat abdominal/pelvic CT with the intention of hospital admission, expedite liver/pancreas biopsy.   ADDENDUM: Dr. Rush Landmark reviewed the patient's Abd/pelvic CT 10/27/2019, EUS with biopsy assessed as a very high risk procedure, high risk of bleeding. EUS deferred. Percutaneous liver biopsy approached recommended at this time. Await further recommendations from oncology.   CC:  Lucianne Lei, MD  Addendum: Reviewed and agree with assessment and management plan.  In agreement for emergency department evaluation for admission to expedite work-up, evaluate relative hypotension and leukocytosis.  I expect cross-sectional imaging  will be needed to rule out abscess/infection.   Pyrtle, Lajuan Lines, MD

## 2019-11-15 ENCOUNTER — Other Ambulatory Visit (HOSPITAL_COMMUNITY): Payer: Self-pay | Admitting: Family Medicine

## 2019-11-15 ENCOUNTER — Encounter (HOSPITAL_COMMUNITY): Payer: Self-pay | Admitting: Radiology

## 2019-11-15 DIAGNOSIS — K869 Disease of pancreas, unspecified: Secondary | ICD-10-CM

## 2019-11-15 DIAGNOSIS — K769 Liver disease, unspecified: Secondary | ICD-10-CM

## 2019-11-15 NOTE — Progress Notes (Signed)
DR 2  [name]. [name] Female, 86 y.o., 1932/09/24 MRN:  [id number] Phone:  [phone] (H) PCP:  Lucianne Lei, MD Primary Cvg:  [place] [facility] 11/16/2019 at 9:00 AM  RE: US Biopsy (Pancreas and Liver) Received: Today Message Contents  Greggory Keen, MD  Jillyn Hidden; P Ir Procedure Requests  [facility] for US liver met core bx   See Ct 10/27/19   Thx  TS       Previous Messages   ----- Message -----  From: Garth Bigness D  Sent: 11/15/2019  2:37 PM EST  To: Ir Procedure Requests  Subject: US Biopsy (Pancreas and Liver)          Procedure:  Biopsy of Pancreas and Liver   Reason: liver lesion, pancreatic mass   History: CT in computer   Provider:  Lucianne Lei   Provider Contact: [phone]

## 2019-11-16 ENCOUNTER — Inpatient Hospital Stay (HOSPITAL_COMMUNITY)
Admission: EM | Admit: 2019-11-16 | Discharge: 2019-11-26 | DRG: 597 | Disposition: A | Discharge status: Hospice | Payer: Medicare Other | Attending: Internal Medicine | Admitting: Internal Medicine

## 2019-11-16 ENCOUNTER — Emergency Department (HOSPITAL_COMMUNITY): Payer: Medicare Other

## 2019-11-16 ENCOUNTER — Ambulatory Visit (INDEPENDENT_AMBULATORY_CARE_PROVIDER_SITE_OTHER): Payer: Medicare Other | Admitting: Nurse Practitioner

## 2019-11-16 ENCOUNTER — Other Ambulatory Visit: Payer: Self-pay

## 2019-11-16 ENCOUNTER — Encounter (HOSPITAL_COMMUNITY): Payer: Self-pay | Admitting: Emergency Medicine

## 2019-11-16 ENCOUNTER — Other Ambulatory Visit (INDEPENDENT_AMBULATORY_CARE_PROVIDER_SITE_OTHER): Payer: Medicare Other

## 2019-11-16 VITALS — BP 72/40 | HR 64 | Temp 97.2°F | Ht 62.0 in | Wt 97.4 lb

## 2019-11-16 DIAGNOSIS — R630 Anorexia: Secondary | ICD-10-CM

## 2019-11-16 DIAGNOSIS — R0682 Tachypnea, not elsewhere classified: Secondary | ICD-10-CM

## 2019-11-16 DIAGNOSIS — R935 Abnormal findings on diagnostic imaging of other abdominal regions, including retroperitoneum: Secondary | ICD-10-CM | POA: Diagnosis not present

## 2019-11-16 DIAGNOSIS — C7889 Secondary malignant neoplasm of other digestive organs: Secondary | ICD-10-CM | POA: Diagnosis not present

## 2019-11-16 DIAGNOSIS — E876 Hypokalemia: Secondary | ICD-10-CM | POA: Diagnosis not present

## 2019-11-16 DIAGNOSIS — Z66 Do not resuscitate: Secondary | ICD-10-CM | POA: Diagnosis not present

## 2019-11-16 DIAGNOSIS — E872 Acidosis: Secondary | ICD-10-CM | POA: Diagnosis present

## 2019-11-16 DIAGNOSIS — C799 Secondary malignant neoplasm of unspecified site: Secondary | ICD-10-CM

## 2019-11-16 DIAGNOSIS — I129 Hypertensive chronic kidney disease with stage 1 through stage 4 chronic kidney disease, or unspecified chronic kidney disease: Secondary | ICD-10-CM | POA: Diagnosis present

## 2019-11-16 DIAGNOSIS — R109 Unspecified abdominal pain: Secondary | ICD-10-CM

## 2019-11-16 DIAGNOSIS — C50912 Malignant neoplasm of unspecified site of left female breast: Secondary | ICD-10-CM | POA: Diagnosis not present

## 2019-11-16 DIAGNOSIS — Z23 Encounter for immunization: Secondary | ICD-10-CM | POA: Diagnosis not present

## 2019-11-16 DIAGNOSIS — D72823 Leukemoid reaction: Secondary | ICD-10-CM | POA: Diagnosis not present

## 2019-11-16 DIAGNOSIS — Z803 Family history of malignant neoplasm of breast: Secondary | ICD-10-CM

## 2019-11-16 DIAGNOSIS — D473 Essential (hemorrhagic) thrombocythemia: Secondary | ICD-10-CM | POA: Diagnosis present

## 2019-11-16 DIAGNOSIS — D638 Anemia in other chronic diseases classified elsewhere: Secondary | ICD-10-CM | POA: Diagnosis present

## 2019-11-16 DIAGNOSIS — R188 Other ascites: Secondary | ICD-10-CM | POA: Diagnosis not present

## 2019-11-16 DIAGNOSIS — I959 Hypotension, unspecified: Secondary | ICD-10-CM | POA: Diagnosis not present

## 2019-11-16 DIAGNOSIS — R279 Unspecified lack of coordination: Secondary | ICD-10-CM | POA: Diagnosis not present

## 2019-11-16 DIAGNOSIS — R0602 Shortness of breath: Secondary | ICD-10-CM | POA: Diagnosis not present

## 2019-11-16 DIAGNOSIS — R791 Abnormal coagulation profile: Secondary | ICD-10-CM | POA: Diagnosis not present

## 2019-11-16 DIAGNOSIS — J90 Pleural effusion, not elsewhere classified: Secondary | ICD-10-CM | POA: Diagnosis not present

## 2019-11-16 DIAGNOSIS — R160 Hepatomegaly, not elsewhere classified: Secondary | ICD-10-CM

## 2019-11-16 DIAGNOSIS — N1831 Chronic kidney disease, stage 3a: Secondary | ICD-10-CM | POA: Diagnosis not present

## 2019-11-16 DIAGNOSIS — J439 Emphysema, unspecified: Secondary | ICD-10-CM | POA: Diagnosis not present

## 2019-11-16 DIAGNOSIS — K8689 Other specified diseases of pancreas: Secondary | ICD-10-CM

## 2019-11-16 DIAGNOSIS — C7989 Secondary malignant neoplasm of other specified sites: Secondary | ICD-10-CM

## 2019-11-16 DIAGNOSIS — R161 Splenomegaly, not elsewhere classified: Secondary | ICD-10-CM | POA: Diagnosis not present

## 2019-11-16 DIAGNOSIS — C259 Malignant neoplasm of pancreas, unspecified: Secondary | ICD-10-CM | POA: Diagnosis present

## 2019-11-16 DIAGNOSIS — M069 Rheumatoid arthritis, unspecified: Secondary | ICD-10-CM | POA: Diagnosis present

## 2019-11-16 DIAGNOSIS — C786 Secondary malignant neoplasm of retroperitoneum and peritoneum: Secondary | ICD-10-CM | POA: Diagnosis not present

## 2019-11-16 DIAGNOSIS — Z170 Estrogen receptor positive status [ER+]: Secondary | ICD-10-CM | POA: Diagnosis not present

## 2019-11-16 DIAGNOSIS — Z20822 Contact with and (suspected) exposure to covid-19: Secondary | ICD-10-CM | POA: Diagnosis not present

## 2019-11-16 DIAGNOSIS — F419 Anxiety disorder, unspecified: Secondary | ICD-10-CM | POA: Diagnosis present

## 2019-11-16 DIAGNOSIS — Z79899 Other long term (current) drug therapy: Secondary | ICD-10-CM

## 2019-11-16 DIAGNOSIS — R64 Cachexia: Secondary | ICD-10-CM | POA: Diagnosis present

## 2019-11-16 DIAGNOSIS — K5520 Angiodysplasia of colon without hemorrhage: Secondary | ICD-10-CM | POA: Diagnosis present

## 2019-11-16 DIAGNOSIS — R634 Abnormal weight loss: Secondary | ICD-10-CM | POA: Diagnosis not present

## 2019-11-16 DIAGNOSIS — Z7189 Other specified counseling: Secondary | ICD-10-CM | POA: Diagnosis not present

## 2019-11-16 DIAGNOSIS — E860 Dehydration: Secondary | ICD-10-CM | POA: Diagnosis present

## 2019-11-16 DIAGNOSIS — R0902 Hypoxemia: Secondary | ICD-10-CM | POA: Diagnosis not present

## 2019-11-16 DIAGNOSIS — C50919 Malignant neoplasm of unspecified site of unspecified female breast: Secondary | ICD-10-CM

## 2019-11-16 DIAGNOSIS — Z681 Body mass index (BMI) 19 or less, adult: Secondary | ICD-10-CM

## 2019-11-16 DIAGNOSIS — J9811 Atelectasis: Secondary | ICD-10-CM | POA: Diagnosis not present

## 2019-11-16 DIAGNOSIS — R1030 Lower abdominal pain, unspecified: Secondary | ICD-10-CM

## 2019-11-16 DIAGNOSIS — Z90710 Acquired absence of both cervix and uterus: Secondary | ICD-10-CM

## 2019-11-16 DIAGNOSIS — C787 Secondary malignant neoplasm of liver and intrahepatic bile duct: Secondary | ICD-10-CM

## 2019-11-16 DIAGNOSIS — R1900 Intra-abdominal and pelvic swelling, mass and lump, unspecified site: Secondary | ICD-10-CM | POA: Diagnosis present

## 2019-11-16 DIAGNOSIS — A419 Sepsis, unspecified organism: Secondary | ICD-10-CM | POA: Diagnosis present

## 2019-11-16 DIAGNOSIS — D72829 Elevated white blood cell count, unspecified: Secondary | ICD-10-CM | POA: Diagnosis not present

## 2019-11-16 DIAGNOSIS — B182 Chronic viral hepatitis C: Secondary | ICD-10-CM

## 2019-11-16 DIAGNOSIS — M1A39X0 Chronic gout due to renal impairment, multiple sites, without tophus (tophi): Secondary | ICD-10-CM | POA: Diagnosis not present

## 2019-11-16 DIAGNOSIS — R509 Fever, unspecified: Secondary | ICD-10-CM | POA: Diagnosis not present

## 2019-11-16 DIAGNOSIS — D649 Anemia, unspecified: Secondary | ICD-10-CM | POA: Diagnosis not present

## 2019-11-16 DIAGNOSIS — Z923 Personal history of irradiation: Secondary | ICD-10-CM

## 2019-11-16 DIAGNOSIS — Z853 Personal history of malignant neoplasm of breast: Secondary | ICD-10-CM | POA: Diagnosis not present

## 2019-11-16 DIAGNOSIS — R079 Chest pain, unspecified: Secondary | ICD-10-CM | POA: Diagnosis not present

## 2019-11-16 DIAGNOSIS — N183 Chronic kidney disease, stage 3 unspecified: Secondary | ICD-10-CM | POA: Diagnosis not present

## 2019-11-16 DIAGNOSIS — Z743 Need for continuous supervision: Secondary | ICD-10-CM | POA: Diagnosis not present

## 2019-11-16 DIAGNOSIS — I361 Nonrheumatic tricuspid (valve) insufficiency: Secondary | ICD-10-CM | POA: Diagnosis not present

## 2019-11-16 DIAGNOSIS — D735 Infarction of spleen: Secondary | ICD-10-CM | POA: Diagnosis not present

## 2019-11-16 DIAGNOSIS — K759 Inflammatory liver disease, unspecified: Secondary | ICD-10-CM | POA: Diagnosis not present

## 2019-11-16 DIAGNOSIS — N179 Acute kidney failure, unspecified: Secondary | ICD-10-CM | POA: Diagnosis not present

## 2019-11-16 DIAGNOSIS — K7460 Unspecified cirrhosis of liver: Secondary | ICD-10-CM | POA: Diagnosis present

## 2019-11-16 DIAGNOSIS — E43 Unspecified severe protein-calorie malnutrition: Secondary | ICD-10-CM | POA: Diagnosis not present

## 2019-11-16 DIAGNOSIS — Z87891 Personal history of nicotine dependence: Secondary | ICD-10-CM | POA: Diagnosis not present

## 2019-11-16 DIAGNOSIS — C252 Malignant neoplasm of tail of pancreas: Secondary | ICD-10-CM | POA: Diagnosis not present

## 2019-11-16 DIAGNOSIS — Z515 Encounter for palliative care: Secondary | ICD-10-CM | POA: Diagnosis not present

## 2019-11-16 DIAGNOSIS — J811 Chronic pulmonary edema: Secondary | ICD-10-CM | POA: Diagnosis not present

## 2019-11-16 DIAGNOSIS — I350 Nonrheumatic aortic (valve) stenosis: Secondary | ICD-10-CM | POA: Diagnosis not present

## 2019-11-16 DIAGNOSIS — R0600 Dyspnea, unspecified: Secondary | ICD-10-CM

## 2019-11-16 DIAGNOSIS — B1920 Unspecified viral hepatitis C without hepatic coma: Secondary | ICD-10-CM

## 2019-11-16 DIAGNOSIS — Z9012 Acquired absence of left breast and nipple: Secondary | ICD-10-CM

## 2019-11-16 DIAGNOSIS — C801 Malignant (primary) neoplasm, unspecified: Secondary | ICD-10-CM | POA: Diagnosis not present

## 2019-11-16 LAB — CBC WITH DIFFERENTIAL/PLATELET
Basophils Absolute: 0.2 10*3/uL — ABNORMAL HIGH (ref 0.0–0.1)
Basophils Relative: 0.8 % (ref 0.0–3.0)
Eosinophils Absolute: 0.1 10*3/uL (ref 0.0–0.7)
Eosinophils Relative: 0.5 % (ref 0.0–5.0)
HCT: 34.3 % — ABNORMAL LOW (ref 36.0–46.0)
Hemoglobin: 11.1 g/dL — ABNORMAL LOW (ref 12.0–15.0)
Lymphocytes Relative: 7.7 % — ABNORMAL LOW (ref 12.0–46.0)
Lymphs Abs: 1.6 10*3/uL (ref 0.7–4.0)
MCHC: 32.4 g/dL (ref 30.0–36.0)
MCV: 88.9 fl (ref 78.0–100.0)
Monocytes Absolute: 1.5 10*3/uL — ABNORMAL HIGH (ref 0.1–1.0)
Monocytes Relative: 7.2 % (ref 3.0–12.0)
Neutro Abs: 17 10*3/uL — ABNORMAL HIGH (ref 1.4–7.7)
Neutrophils Relative %: 83.8 % — ABNORMAL HIGH (ref 43.0–77.0)
Platelets: 563 10*3/uL — ABNORMAL HIGH (ref 150.0–400.0)
RBC: 3.86 Mil/uL — ABNORMAL LOW (ref 3.87–5.11)
RDW: 14 % (ref 11.5–15.5)
WBC: 20.2 10*3/uL (ref 4.0–10.5)

## 2019-11-16 LAB — COMPREHENSIVE METABOLIC PANEL
ALT: 15 U/L (ref 0–35)
AST: 22 U/L (ref 0–37)
Albumin: 3.7 g/dL (ref 3.5–5.2)
Alkaline Phosphatase: 237 U/L — ABNORMAL HIGH (ref 39–117)
BUN: 34 mg/dL — ABNORMAL HIGH (ref 6–23)
CO2: 19 mEq/L (ref 19–32)
Calcium: 10.3 mg/dL (ref 8.4–10.5)
Chloride: 102 mEq/L (ref 96–112)
Creatinine, Ser: 1.34 mg/dL — ABNORMAL HIGH (ref 0.40–1.20)
GFR: 45.42 mL/min — ABNORMAL LOW (ref >60.00)
Glucose, Bld: 136 mg/dL — ABNORMAL HIGH (ref 70–99)
Potassium: 5.4 mEq/L — ABNORMAL HIGH (ref 3.5–5.1)
Sodium: 134 mEq/L — ABNORMAL LOW (ref 135–145)
Total Bilirubin: 0.6 mg/dL (ref 0.2–1.2)
Total Protein: 8.6 g/dL — ABNORMAL HIGH (ref 6.0–8.3)

## 2019-11-16 LAB — URINALYSIS, ROUTINE W REFLEX MICROSCOPIC
Bilirubin Urine: NEGATIVE
Glucose, UA: NEGATIVE mg/dL
Hgb urine dipstick: NEGATIVE
Ketones, ur: NEGATIVE mg/dL
Leukocytes,Ua: NEGATIVE
Nitrite: NEGATIVE
Protein, ur: NEGATIVE mg/dL
Specific Gravity, Urine: 1.023 (ref 1.005–1.030)
pH: 5 (ref 5.0–8.0)

## 2019-11-16 LAB — PROTIME-INR
INR: 1.5 ratio — ABNORMAL HIGH (ref 0.8–1.0)
Prothrombin Time: 17.1 s — ABNORMAL HIGH (ref 9.6–13.1)

## 2019-11-16 LAB — SARS CORONAVIRUS 2 (TAT 6-24 HRS): SARS Coronavirus 2: NEGATIVE

## 2019-11-16 LAB — LACTIC ACID, PLASMA
Lactic Acid, Venous: 3.2 mmol/L (ref 0.5–1.9)
Lactic Acid, Venous: 3.6 mmol/L (ref 0.5–1.9)
Lactic Acid, Venous: 1.9 mmol/L (ref 0.5–1.9)

## 2019-11-16 LAB — D-DIMER, QUANTITATIVE: D-Dimer, Quant: 3.14 ug/mL-FEU — ABNORMAL HIGH (ref 0.00–0.50)

## 2019-11-16 LAB — C-REACTIVE PROTEIN: CRP: 14.9 mg/dL (ref 0.5–20.0)

## 2019-11-16 MED ORDER — SODIUM CHLORIDE 0.9 % IV BOLUS
500.0000 mL | Freq: Once | INTRAVENOUS | Status: AC
  Administered 2019-11-16: 15:00:00 500 mL via INTRAVENOUS

## 2019-11-16 MED ORDER — SODIUM CHLORIDE 0.9 % IV SOLN: INTRAVENOUS | Status: DC

## 2019-11-16 MED ORDER — PHYTONADIONE 5 MG PO TABS
5.0000 mg | ORAL_TABLET | Freq: Once | ORAL | Status: AC
  Administered 2019-11-16: 5 mg via ORAL
  Filled 2019-11-16: qty 1

## 2019-11-16 MED ORDER — PIPERACILLIN-TAZOBACTAM 3.375 G IVPB 30 MIN
3.3750 g | Freq: Once | INTRAVENOUS | Status: AC
  Administered 2019-11-16: 3.375 g via INTRAVENOUS
  Filled 2019-11-16: qty 50

## 2019-11-16 MED ORDER — ENOXAPARIN SODIUM 300 MG/3ML IJ SOLN
20.0000 mg | INTRAMUSCULAR | Status: DC
  Filled 2019-11-16: qty 0.2

## 2019-11-16 MED ORDER — SODIUM CHLORIDE 0.9 % IV SOLN
2.0000 g | INTRAVENOUS | Status: DC
  Administered 2019-11-16 – 2019-11-19 (×4): 2 g via INTRAVENOUS
  Filled 2019-11-16 (×5): qty 2

## 2019-11-16 MED ORDER — OMEPRAZOLE 20 MG PO CPDR: 20.0000 mg | DELAYED_RELEASE_CAPSULE | Freq: Every day | ORAL | 0 refills | Status: DC

## 2019-11-16 MED ORDER — IOHEXOL 350 MG/ML SOLN
65.0000 mL | Freq: Once | INTRAVENOUS | Status: AC | PRN
  Administered 2019-11-16: 18:00:00 65 mL via INTRAVENOUS

## 2019-11-16 MED ORDER — VANCOMYCIN HCL 750 MG/150ML IV SOLN
750.0000 mg | INTRAVENOUS | Status: DC
  Administered 2019-11-16 – 2019-11-18 (×2): 750 mg via INTRAVENOUS
  Filled 2019-11-16 (×2): qty 150

## 2019-11-16 MED ORDER — SODIUM CHLORIDE 0.9 % IV BOLUS
1000.0000 mL | Freq: Once | INTRAVENOUS | Status: AC
  Administered 2019-11-16: 19:00:00 1000 mL via INTRAVENOUS

## 2019-11-16 MED ORDER — SODIUM CHLORIDE 0.9 % IV BOLUS
500.0000 mL | Freq: Once | INTRAVENOUS | Status: AC
  Administered 2019-11-16: 16:00:00 500 mL via INTRAVENOUS

## 2019-11-16 NOTE — H&P (Signed)
History and Physical  [name] [id number] DOB: [birth year] DOA: 11/16/2019   Referring physician: ER provider PCP: Lucianne Lei, MD  Outpatient Specialists: GI Patient coming from: Referred to the hospital from the GI  Chief Complaint: Abdominal pain for 2 weeks  HPI:  Patient is an 86 year old female with past medical history significant for breast cancer, hypertension, hyperlipidemia, chronic abdominal pain, rheumatoid arthritis and chronic kidney disease stage IIIA/B.  Patient was seen by the GI provider earlier today for abdominal pain.  Apparently, patient has had abdominal pain for about 2 weeks.  Patient reported constant lower abdominal pain.  There is associated poor p.o. intake and poor appetite.  Patient reported significant weight loss.  On presentation to the GI provider's office, patient was found to be hypotensive, with systolic blood pressure in the 70s.  Patient was advised to come to the hospital for further assessment and management.  Lab work done at the hospital revealed CBC of 20.2, hemoglobin of 11.1, hematocrit of 34.3 and platelet count of 563.  Chemistry revealed sodium of 134, potassium of 5.4, chloride 102, CO2 of 19, BUN of 34 with serum creatinine of 1.3 from blood sugar of 136.  INR of 1.5 and lactic acid of 3.6 were reported as well.  D-dimer was elevated.  CTA of the chest and abdomen revealed lung emphysema, bilateral lower lung lobe atelectasis, large stable soft tissue mass around the tail of pancreas and numerous liver metastasis.  Evolving splenic infarct was also queried.  No headache, no neck, no chest pain, no shortness of breath, no no nausea vomiting or change in bowel habit.  Hospitalist team will admit patient for further assessment and management.  ED Course: On presentation to the emergency room, vitals revealed temperature of 97.3, blood pressure of 72/40 - 135/97 mmHg, heart rate of 64-1 8/min and respiratory rate of 1428.  O2 sat is 98% on  room air.  Lab work is as documented above.  Patient has received vitamin K for elevated INR.  Patient has been aggressively hydrated, pancultured and started on antibiotics.  The hospitalist team has been called to admit patient.  Pertinent labs: As documented above.  Imaging: independently reviewed.   Review of Systems:  Negative for fever, visual changes, sore throat, rash, new muscle aches, chest pain, SOB, dysuria, bleeding, n/v.  Past Medical History:  Diagnosis Date  . Abdominal pain 04/2016  . Anemia of chronic disease   . Anxiety   . Breast cancer ([place]) 08/28/12   IDC left breast bx=invasive ductal ca,ER/PR=+  . Breast cancer ([place]) 09/01/2012  . Hepatitis   . Hypertension   . Lipid disorder   . RA (rheumatoid arthritis) ([employer])   . Radiation 11/12/12-12/15/11   Left Breast/50 Gy  . Renal insufficiency   . Wears dentures    top  . Wears glasses     Past Surgical History:  Procedure Laterality Date  . ABDOMINAL HYSTERECTOMY      approx 20 years ago  . APPENDECTOMY     20 years ago  . bones spur     removed 20 years ago  . COLONOSCOPY  2009?  . ENTEROSCOPY N/A 11/08/2013   Procedure: ENTEROSCOPY;  Surgeon: Beryle Beams, MD;  Location: [facility] ENDOSCOPY;  Service: Endoscopy;  Laterality: N/A;  . ENTEROSCOPY N/A 04/18/2014   Procedure: ENTEROSCOPY;  Surgeon: Beryle Beams, MD;  Location: [facility] ENDOSCOPY;  Service: Endoscopy;  Laterality: N/A;  . FOOT SURGERY  1992   bone spurs both  feet  . PARTIAL MASTECTOMY WITH NEEDLE LOCALIZATION AND AXILLARY SENTINEL LYMPH NODE BX  10/08/2012   Procedure: PARTIAL MASTECTOMY WITH NEEDLE LOCALIZATION AND AXILLARY SENTINEL LYMPH NODE BX;  Surgeon: Adin Hector, MD;  Location: [place];  Service: General;  Laterality: Left;  Left partial mastectomy with Needle localization and Sentinel lymph node biospy     reports that she quit smoking about 12 years ago. Her smoking use included cigarettes. She smoked 1.00 pack per day.  She has never used smokeless tobacco. She reports previous alcohol use. She reports that she does not use drugs.  No Known Allergies  Family History  Problem Relation Age of Onset  . Cancer Father   . Prostate cancer Father   . Tuberculosis Mother   . Breast cancer Cousin      Prior to Admission medications   Medication Sig Start Date End Date Taking? Authorizing Provider  allopurinol (ZYLOPRIM) 100 MG tablet Take 1 tablet (100 mg total) by mouth daily. 10/11/19   Bo Merino, MD  amLODipine (NORVASC) 5 MG tablet TK 1 T PO QD 01/23/18   [provider]  anastrozole (ARIMIDEX) 1 MG tablet Take 1 tablet by mouth daily. 11/11/19   Nicholas Lose, MD  irbesartan (AVAPRO) 150 MG tablet TK 1 T PO QD 01/23/18   [provider]  metoCLOPramide (REGLAN) 5 MG tablet Take 1 tablet by mouth TID as needed for nausea and vomiting.  Take 15 to 20 minutes before a meal. 10/12/19   Nicholas Lose, MD  omeprazole (PRILOSEC) 20 MG capsule Take 1 capsule (20 mg total) by mouth daily. 11/16/19   Noralyn Pick, NP    Physical Exam: Vitals:   11/16/19 1600 11/16/19 1645 11/16/19 1700 11/16/19 1715  BP: (!) 111/44 (!) 110/44 (!) 106/56 (!) 99/56  Pulse:  86 86   Resp: 17 20 (!) 22 (!) 21  Temp:      TempSrc:      SpO2:  96% 96%   Weight:      Height:        Constitutional:  . Appears calm and comfortable Eyes:  . No pallor. No jaundice.  ENMT:  . external ears, nose appear normal Neck:  . Neck is supple. No JVD Respiratory:  . CTA bilaterally, no w/r/r.  . Respiratory effort normal. No retractions or accessory muscle use Cardiovascular:  . S1S2 . No LE extremity edema   Abdomen:  . Abdomen is soft and non tender. Organs are difficult to assess. Neurologic:  . Awake and alert. . Moves all limbs.  Wt Readings from Last 3 Encounters:  11/16/19 44.2 kg  11/16/19 44.2 kg  07/15/19 52.8 kg    I have personally reviewed following labs and imaging  studies  Labs on Admission:  CBC: Recent Labs  Lab 11/16/19 1033  WBC 20.2 Repeated and verified X2.*  NEUTROABS 17.0*  HGB 11.1*  HCT 34.3*  MCV 88.9  PLT 99991111*   Basic Metabolic Panel: Recent Labs  Lab 11/16/19 1033  NA 134*  K 5.4*  CL 102  CO2 19  GLUCOSE 136*  BUN 34*  CREATININE 1.34*  CALCIUM 10.3   Liver Function Tests: Recent Labs  Lab 11/16/19 1033  AST 22  ALT 15  ALKPHOS 237*  BILITOT 0.6  PROT 8.6*  ALBUMIN 3.7   No results for input(s): LIPASE, AMYLASE in the last 168 hours. No results for input(s): AMMONIA in the last 168 hours. Coagulation Profile: Recent Labs  Lab 11/16/19 1033  INR 1.5*   Cardiac Enzymes: No results for input(s): CKTOTAL, CKMB, CKMBINDEX, TROPONINI in the last 168 hours. BNP (last 3 results) No results for input(s): PROBNP in the last 8760 hours. HbA1C: No results for input(s): HGBA1C in the last 72 hours. CBG: No results for input(s): GLUCAP in the last 168 hours. Lipid Profile: No results for input(s): CHOL, HDL, LDLCALC, TRIG, CHOLHDL, LDLDIRECT in the last 72 hours. Thyroid Function Tests: No results for input(s): TSH, T4TOTAL, FREET4, T3FREE, THYROIDAB in the last 72 hours. Anemia Panel: No results for input(s): VITAMINB12, FOLATE, FERRITIN, TIBC, IRON, RETICCTPCT in the last 72 hours. Urine analysis:    Component Value Date/Time   COLORURINE YELLOW 02/07/2017 [place] 02/07/2017 0938   LABSPEC 1.012 02/07/2017 0938   PHURINE 5.0 02/07/2017 0938   GLUCOSEU NEGATIVE 02/07/2017 0938   HGBUR NEGATIVE 02/07/2017 0938   BILIRUBINUR NEGATIVE 02/07/2017 0938   KETONESUR NEGATIVE 02/07/2017 0938   PROTEINUR NEGATIVE 02/07/2017 0938   UROBILINOGEN 0.2 05/15/2015 1542   NITRITE NEGATIVE 02/07/2017 0938   LEUKOCYTESUR NEGATIVE 02/07/2017 0938   Sepsis Labs: @LABRCNTIP (procalcitonin:4,lacticidven:4) ) Recent Results (from the past 240 hour(s))  SARS CORONAVIRUS 2 (TAT 6-24 HRS) Nasopharyngeal  Nasopharyngeal Swab     Status: None   Collection Time: 11/16/19  1:32 PM   Specimen: Nasopharyngeal Swab  Result Value Ref Range Status   SARS Coronavirus 2 NEGATIVE NEGATIVE Final    Comment: (NOTE) SARS-CoV-2 target nucleic acids are NOT DETECTED. The SARS-CoV-2 RNA is generally detectable in upper and lower respiratory specimens during the acute phase of infection. Negative results do not preclude SARS-CoV-2 infection, do not rule out co-infections with other pathogens, and should not be used as the sole basis for treatment or other patient management decisions. Negative results must be combined with clinical observations, patient history, and epidemiological information. The expected result is Negative. Fact Sheet for Patients: SugarRoll.be Fact Sheet for Healthcare Providers: https://www.woods-mathews.com/ This test is not yet approved or cleared by the [place] FDA and  has been authorized for detection and/or diagnosis of SARS-CoV-2 by FDA under an Emergency Use Authorization (EUA). This EUA will remain  in effect (meaning this test can be used) for the duration of the COVID-19 declaration under Section 56 4(b)(1) of the Act, 21 U.S.C. section 360bbb-3(b)(1), unless the authorization is terminated or revoked sooner. Performed at [facility], [place] [address], [place], [place] [address]       Radiological Exams on Admission: CT Angio Chest PE W/Cm &/Or Wo Cm  Result Date: 11/16/2019 CLINICAL DATA:  Lower abdominal pain with hypotension. EXAM: CT CHEST, ABDOMEN, AND PELVIS WITH CONTRAST TECHNIQUE: Multidetector CT imaging of the chest, abdomen and pelvis was performed following the standard protocol during bolus administration of intravenous contrast. CONTRAST:  25mL OMNIPAQUE IOHEXOL 350 MG/ML SOLN COMPARISON:  Abdomen and pelvis CT, dated October 27, 2019, is available. FINDINGS: CT CHEST FINDINGS Cardiovascular: There is  marked severity calcification of the thoracic aorta. The pulmonary arteries are normal in appearance without evidence of intraluminal filling defects. Normal heart size. A very small (4 mm) anterior pericardial effusion is seen. Marked severity coronary artery calcification is seen. Mediastinum/Nodes: No enlarged mediastinal, hilar, or axillary lymph nodes. Thyroid gland, trachea, and esophagus demonstrate no significant findings. Lungs/Pleura: There is moderate severity emphysematous lung disease. Mild atelectasis is seen within the posterior aspect of the left upper lobe and bilateral lower lobes. There is no evidence of a pleural effusion  or pneumothorax. Musculoskeletal: Multilevel degenerative changes seen throughout the thoracic spine. CT ABDOMEN PELVIS FINDINGS Hepatobiliary: Numerous heterogeneous low-attenuation liver lesions of various sizes are seen scattered throughout the right and left lobe. These areas are stable in size and appearance when compared to the prior study. No gallstones, gallbladder wall thickening, or biliary dilatation. Pancreas: A predominant stable 6.1 cm x 5.6 cm heterogeneous low-attenuation soft tissue mass is seen along the region of the pancreatic tail and splenic hilum. A stable, adjacent 2.4 cm x 1.1 cm soft tissue mass is noted near the splenic flexure (axial CT image 20, CT series number 12). An additional stable 3.0 cm x 2.6 cm soft tissue mass is seen within the anterolateral aspect of the mid to upper left abdomen (axial CT image 28, CT series number 12). Spleen: A large, stable area of heterogeneous attenuation is seen along the posterior aspect of the splenic parenchyma. Adrenals/Urinary Tract: The left and right adrenal glands are poorly visualized. Kidneys are normal, without renal calculi, focal lesion, or hydronephrosis. Bladder is unremarkable. Stomach/Bowel: Stomach is within normal limits. The appendix is not clearly identified. No evidence of bowel wall  thickening, distention, or inflammatory changes. Vascular/Lymphatic: Marked severity aortic atherosclerosis. No enlarged abdominal or pelvic lymph nodes. Reproductive: Status post hysterectomy. No adnexal masses. Other: No abdominal wall hernia or abnormality. No abdominopelvic ascites. Musculoskeletal: Multilevel degenerative changes seen throughout the lumbar spine. IMPRESSION: 1. Moderate severity emphysematous lung disease. 2. Mild left upper lobe and bilateral lower lobe atelectasis. 3. Large stable soft tissue mass along the region of the pancreatic tail and splenic hilum, consistent with primary versus metastatic neoplastic disease. 4. Numerous stable liver lesions consistent with liver metastasis. 5. Stable soft tissue masses within the mid to upper left abdomen and region of the splenic flexure consistent with additional neoplastic lesions. 6. Stable findings likely consistent with an evolving splenic infarct. 7. No evidence of a retroperitoneal hematoma in the presence of lower abdominal pain and hypotension. Electronically Signed   By: Virgina Norfolk M.D.   On: 11/16/2019 18:24   CT Abdomen Pelvis W Contrast  Result Date: 11/16/2019 CLINICAL DATA:  Lower abdominal pain with hypotension. EXAM: CT CHEST, ABDOMEN, AND PELVIS WITH CONTRAST TECHNIQUE: Multidetector CT imaging of the chest, abdomen and pelvis was performed following the standard protocol during bolus administration of intravenous contrast. CONTRAST:  12mL OMNIPAQUE IOHEXOL 350 MG/ML SOLN COMPARISON:  Abdomen and pelvis CT, dated October 27, 2019, is available. FINDINGS: CT CHEST FINDINGS Cardiovascular: There is marked severity calcification of the thoracic aorta. The pulmonary arteries are normal in appearance without evidence of intraluminal filling defects. Normal heart size. A very small (4 mm) anterior pericardial effusion is seen. Marked severity coronary artery calcification is seen. Mediastinum/Nodes: No enlarged mediastinal,  hilar, or axillary lymph nodes. Thyroid gland, trachea, and esophagus demonstrate no significant findings. Lungs/Pleura: There is moderate severity emphysematous lung disease. Mild atelectasis is seen within the posterior aspect of the left upper lobe and bilateral lower lobes. There is no evidence of a pleural effusion or pneumothorax. Musculoskeletal: Multilevel degenerative changes seen throughout the thoracic spine. CT ABDOMEN PELVIS FINDINGS Hepatobiliary: Numerous heterogeneous low-attenuation liver lesions of various sizes are seen scattered throughout the right and left lobe. These areas are stable in size and appearance when compared to the prior study. No gallstones, gallbladder wall thickening, or biliary dilatation. Pancreas: A predominant stable 6.1 cm x 5.6 cm heterogeneous low-attenuation soft tissue mass is seen along the region of the pancreatic tail and  splenic hilum. A stable, adjacent 2.4 cm x 1.1 cm soft tissue mass is noted near the splenic flexure (axial CT image 20, CT series number 12). An additional stable 3.0 cm x 2.6 cm soft tissue mass is seen within the anterolateral aspect of the mid to upper left abdomen (axial CT image 28, CT series number 12). Spleen: A large, stable area of heterogeneous attenuation is seen along the posterior aspect of the splenic parenchyma. Adrenals/Urinary Tract: The left and right adrenal glands are poorly visualized. Kidneys are normal, without renal calculi, focal lesion, or hydronephrosis. Bladder is unremarkable. Stomach/Bowel: Stomach is within normal limits. The appendix is not clearly identified. No evidence of bowel wall thickening, distention, or inflammatory changes. Vascular/Lymphatic: Marked severity aortic atherosclerosis. No enlarged abdominal or pelvic lymph nodes. Reproductive: Status post hysterectomy. No adnexal masses. Other: No abdominal wall hernia or abnormality. No abdominopelvic ascites. Musculoskeletal: Multilevel degenerative changes  seen throughout the lumbar spine. IMPRESSION: 1. Moderate severity emphysematous lung disease. 2. Mild left upper lobe and bilateral lower lobe atelectasis. 3. Large stable soft tissue mass along the region of the pancreatic tail and splenic hilum, consistent with primary versus metastatic neoplastic disease. 4. Numerous stable liver lesions consistent with liver metastasis. 5. Stable soft tissue masses within the mid to upper left abdomen and region of the splenic flexure consistent with additional neoplastic lesions. 6. Stable findings likely consistent with an evolving splenic infarct. 7. No evidence of a retroperitoneal hematoma in the presence of lower abdominal pain and hypotension. Electronically Signed   By: Virgina Norfolk M.D.   On: 11/16/2019 18:24    Active Problems:   Sepsis ([place])   Assessment/Plan Sepsis syndrome/SIRS: -Admit patient for further assessment and management. -Follow cultures. -Continue aggressive IV resuscitation -Continue IV antibiotics -Management will depend on hospital course  Liver lesions, possible metastatic liver lesion -Patient will need tissue diagnosis -Consult IR versus heme-onc team. -Team will see patient in the morning. -Likely contributing to the abdominal pain.  Hypotension:  -Patient has had poor p.o. intake -Continue aggressive hydration -Monitor closely -Low threshold to consult palliative care team  DVT prophylaxis: Subacute Lovenox Code Status: Full code Family Communication:  Disposition Plan: This will depend on hospital course Consults called: GI surgery following patient.  Consider IR and oncology input in the morning Admission status: Inpatient  Time spent: 65 minutes  Dana Allan, MD  [facility] Pager #: [phone] 7PM-7AM contact night coverage as above  11/16/2019, 8:10 PM

## 2019-11-16 NOTE — Progress Notes (Signed)
Pharmacy Antibiotic Note  [name] is a 86 y.o. female admitted on 11/16/2019 with sepsis.  Pharmacy has been consulted for Cefepime and Vancomycin dosing.   Height: 5\' 2"  (157.5 cm) Weight: 97 lb 6.4 oz (44.2 kg) IBW/kg (Calculated) : 50.1  Temp (24hrs), Avg:97.5 F (36.4 C), Min:97.2 F (36.2 C), Max:97.7 F (36.5 C)  Recent Labs  Lab 11/16/19 1033 11/16/19 1400 11/16/19 1634  WBC 20.2 Repeated and verified X2.*  --   --   CREATININE 1.34*  --   --   LATICACIDVEN  --  3.2* 3.6*    Estimated Creatinine Clearance: 21.4 mL/min (A) (by C-G formula based on SCr of 1.34 mg/dL (H)).    No Known Allergies  Antimicrobials this admission: 1/26 Cefepime >>  1/26 Vancomycin >>   Dose adjustments this admission:   Microbiology results: 1/26 Bcx: Pending   Plan:  - Cefepime 2g IV q24hr  - Vancomycin 750mg  IV q48hr  - Est Calc AUC 531 - Monitor patients renal function and urine output   Thank you for allowing pharmacy to be a part of this patient's care.  Duanne Limerick PharmD. BCPS  11/16/2019 8:17 PM

## 2019-11-16 NOTE — ED Notes (Signed)
Sent a urine culture with the urine specimen 

## 2019-11-16 NOTE — ED Provider Notes (Signed)
[place] EMERGENCY DEPARTMENT Provider Note   CSN: [id number] Arrival date & time: 11/16/19  1238     History Chief Complaint  Patient presents with  . Hypotension  . Abdominal Pain    [name] is a 86 y.o. female.  The history is provided by the patient and medical records. No language interpreter was used.  Abdominal Pain  [name] is a 86 y.o. female who presents to the Emergency Department complaining of hypertension and abdominal pain. She presents the emergency department upon referral from her gastroenterologist for evaluation of low blood pressure and abdominal pain. She complains of one and a half weeks of generalized lower abdominal pain. She had an outpatient CT scan performed in early January that demonstrated multiple abdominal masses concerning for pancreatic or metastatic cancer. She states that over the last several days she is had progressively poor appetite generalized weakness and malaise. She had a follow-up appointment with [facility]. today was found to be hypotensive there was a blood pressure in the 70s. She also had outpatient labs performed today that were abnormal. She does have a history of hypertension and takes amlodipine, 5 mg daily. She complains of dark stools but no black stools. She reports that she had fecal occult blood test performed at the clinic today that was negative. She does not take any blood thinners. She denies any fevers, cough, chest pain, difficulty breathing.    Past Medical History:  Diagnosis Date  . Abdominal pain 04/2016  . Anemia of chronic disease   . Anxiety   . Breast cancer ([place]) 08/28/12   IDC left breast bx=invasive ductal ca,ER/PR=+  . Breast cancer ([place]) 09/01/2012  . Hepatitis   . Hypertension   . Lipid disorder   . RA (rheumatoid arthritis) ([place])   . Radiation 11/12/12-12/15/11   Left Breast/50 Gy  . Renal insufficiency   . Wears dentures    top  . Wears glasses     Patient  Active Problem List   Diagnosis Date Noted  . Primary osteoarthritis of both hands 01/09/2017  . Primary osteoarthritis of both feet 01/09/2017  . Spondylosis of lumbar region without myelopathy or radiculopathy 01/09/2017  . History of hepatitis 12/05/2016  . 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 History of chronic kidney disease 12/05/2016  . Pancreatitis, acute 04/23/2016  . Essential hypertension 04/23/2016  . Upper abdominal pain 04/22/2016  . Abdominal pain 04/22/2016  . Vasovagal syncope 09/19/2015  . Postprandial nausea 03/08/2015  . Syncope 03/07/2015  . Dyspnea on exertion 03/07/2015  . Blood loss anemia 04/16/2014  . Acute blood loss anemia 04/16/2014  . Melena 04/16/2014  . CKD (chronic kidney disease), stage III 03/06/2014  . Nonspecific abnormal finding in stool contents 11/06/2013  . Anemia 11/05/2013  . Renal insufficiency   . Breast cancer, left breast ([place]) 09/01/2012  . Alcohol use 07/06/2012  . Near syncope 07/06/2012  . Hepatitis   . Lipid disorder   . Anxiety   . Hypertension   . Anemia of chronic disease 04/16/2012    Past Surgical History:  Procedure Laterality Date  . ABDOMINAL HYSTERECTOMY      approx 20 years ago  . APPENDECTOMY     20 years ago  . bones spur     removed 20 years ago  . COLONOSCOPY  2009?  . ENTEROSCOPY N/A 11/08/2013   Procedure: ENTEROSCOPY;  Surgeon: Beryle Beams, MD;  Location: [facility]  ENDOSCOPY;  Service: Endoscopy;  Laterality: N/A;  . ENTEROSCOPY N/A 04/18/2014   Procedure: ENTEROSCOPY;  Surgeon: Beryle Beams, MD;  Location: [facility] ENDOSCOPY;  Service: Endoscopy;  Laterality: N/A;  . FOOT SURGERY  1992   bone spurs both feet  . PARTIAL MASTECTOMY WITH NEEDLE LOCALIZATION AND AXILLARY SENTINEL LYMPH NODE BX  10/08/2012   Procedure: PARTIAL MASTECTOMY WITH NEEDLE LOCALIZATION AND AXILLARY SENTINEL LYMPH NODE BX;  Surgeon: Adin Hector, MD;  Location: [place];   Service: General;  Laterality: Left;  Left partial mastectomy with Needle localization and Sentinel lymph node biospy     OB History   No obstetric history on file.     Family History  Problem Relation Age of Onset  . Cancer Father   . Prostate cancer Father   . Tuberculosis Mother   . Breast cancer Cousin     Social History   Tobacco Use  . Smoking status: Former Smoker    Packs/day: 1.00    Types: Cigarettes    Quit date: 10/27/2007    Years since quitting: 12.0  . Smokeless tobacco: Never Used  . Tobacco comment: started smoking age 25  Substance Use Topics  . Alcohol use: Not Currently    Comment: rarely  . Drug use: No    Home Medications Prior to Admission medications   Medication Sig Start Date End Date Taking? Authorizing Provider  allopurinol (ZYLOPRIM) 100 MG tablet Take 1 tablet (100 mg total) by mouth daily. 10/11/19   Bo Merino, MD  amLODipine (NORVASC) 5 MG tablet TK 1 T PO QD 01/23/18   [provider]  anastrozole (ARIMIDEX) 1 MG tablet Take 1 tablet by mouth daily. 11/11/19   Nicholas Lose, MD  irbesartan (AVAPRO) 150 MG tablet TK 1 T PO QD 01/23/18   [provider]  metoCLOPramide (REGLAN) 5 MG tablet Take 1 tablet by mouth TID as needed for nausea and vomiting.  Take 15 to 20 minutes before a meal. 10/12/19   Nicholas Lose, MD  omeprazole (PRILOSEC) 20 MG capsule Take 1 capsule (20 mg total) by mouth daily. 11/16/19   Noralyn Pick, NP    Allergies    Patient has no known allergies.  Review of Systems   Review of Systems  Gastrointestinal: Positive for abdominal pain.  All other systems reviewed and are negative.   Physical Exam Updated Vital Signs BP (!) 91/42 (BP Location: Left Arm)   Pulse 91   Resp 14   Ht 5\' 2"  (1.575 m)   Wt 44.2 kg   SpO2 100%   BMI 17.81 kg/m   Physical Exam Vitals and nursing note reviewed.  Constitutional:      Appearance: She is well-developed.     Comments: thin  HENT:      Head: Normocephalic and atraumatic.  Cardiovascular:     Rate and Rhythm: Normal rate and regular rhythm.     Heart sounds: No murmur.  Pulmonary:     Effort: Pulmonary effort is normal. No respiratory distress.     Breath sounds: Normal breath sounds.  Abdominal:     Palpations: Abdomen is soft.     Tenderness: There is no abdominal tenderness. There is no guarding or rebound.  Musculoskeletal:        General: No tenderness.  Skin:    General: Skin is warm and dry.  Neurological:     Mental Status: She is alert and oriented to person, place, and time.  Psychiatric:  Behavior: Behavior normal.     ED Results / Procedures / Treatments   Labs (all labs ordered are listed, but only abnormal results are displayed) Labs Reviewed  URINALYSIS, ROUTINE W REFLEX MICROSCOPIC  D-DIMER, QUANTITATIVE (NOT AT [facility])  LACTIC ACID, PLASMA  LACTIC ACID, PLASMA    EKG None  Radiology No results found.  Procedures Procedures (including critical care time) CRITICAL CARE Performed by: Quintella Reichert   Total critical care time: 35 minutes  Critical care time was exclusive of separately billable procedures and treating other patients.  Critical care was necessary to treat or prevent imminent or life-threatening deterioration.  Critical care was time spent personally by me on the following activities: development of treatment plan with patient and/or surrogate as well as nursing, discussions with consultants, evaluation of patient's response to treatment, examination of patient, obtaining history from patient or surrogate, ordering and performing treatments and interventions, ordering and review of laboratory studies, ordering and review of radiographic studies, pulse oximetry and re-evaluation of patient's condition.  Medications Ordered in ED Medications  sodium chloride 0.9 % bolus 500 mL (has no administration in time range)    ED Course  I have reviewed the triage vital  signs and the nursing notes.  Pertinent labs & imaging results that were available during my care of the patient were reviewed by me and considered in my medical decision making (see chart for details).    MDM Rules/Calculators/A&P                      Patient here for evaluation of progressive abdominal pain, hypotension identified at outpatient visit today. She has mild tenderness on abdominal examination but no peritoneal findings. Reviewed records from outpatient visit earlier today. CBC with leukocytosis, mild anemia, mild renal insufficiency. She was treated with IV fluid hydration. Lactic acid is mildly elevated, she was treated with antibiotics but at this point time there is no clear evidence of infectious process. Plan to obtain CTA to rule out PE as well as CT abdomen pelvis to rule out hemorrhage or complicating feature of recently diagnosed abdominal masses. Patient care transferred pending CT scans. Anticipate admission for further evaluation and workup. Final Clinical Impression(s) / ED Diagnoses Final diagnoses:  None    Rx / DC Orders ED Discharge Orders    None       Quintella Reichert, MD 11/16/19 1733

## 2019-11-16 NOTE — ED Notes (Signed)
Pt to CT at this time.

## 2019-11-16 NOTE — Patient Instructions (Signed)
If you are age 84 or older, your body mass index should be between 23-30. Your Body mass index is 17.81 kg/m. If this is out of the aforementioned range listed, please consider follow up with your Primary Care Provider.  If you are age 42 or younger, your body mass index should be between 19-25. Your Body mass index is 17.81 kg/m. If this is out of the aformentioned range listed, please consider follow up with your Primary Care Provider.   Please follow the instructions below: 1. Boost 3 cans daily 2. Call your PCP regarding your low Blood pressure-request Blood pressure instructions 3. We are sending in to the pharmacy Omeprazole 20 mg. Please take 1 tablet daily for 30 days. 4. Follow up in 2 weeks. 5. Near future we need to schedule a colonoscopy and endoscopy. 6. Call the office if you have any further black stools.  Your provider has requested that you go to the basement level for lab work before leaving today. Press "B" on the elevator. The lab is located at the first door on the left as you exit the elevator.  Thank you for choosing [facility] Noralyn Pick, CRNP

## 2019-11-16 NOTE — ED Triage Notes (Signed)
Pt states she was sent from the clinic due to low BP and abnormal labs. Pt endorses abd pain x1 week. Denies n/v/d.

## 2019-11-16 NOTE — ED Provider Notes (Signed)
  Physical Exam  BP (!) 99/56   Pulse 86   Temp 97.7 F (36.5 C) (Oral)   Resp (!) 21   Ht 5\' 2"  (1.575 m)   Wt 44.2 kg   SpO2 96%   BMI 17.81 kg/m   Physical Exam  ED Course/Procedures     Procedures  MDM  Care assumed at 4 pm. Patient here with hypotension and leukocytosis of 20.  Patient was sent in by GI for IV fluids and admission and a CT.  CT was pending at signout.  7:21 PM CTA chest and CT ab/pel showed increased mets to liver and spleen.  Her blood pressure improved to low 100s after IV bolus.  I talked to Dr. Loletha Carrow from GI.  He reviewed her scans and states that her INR is 1.5 .  He recommend vitamin K orally. He states that GI will follow up with the patient in the morning.  Hospitalist to admit the patient for sepsis, lactic acidosis of note, patient's lactate is stable after IV bolus instead of decreasing likely due to her liver mets. Will admit to stepdown       Drenda Freeze, MD 11/16/19 [phone]

## 2019-11-17 ENCOUNTER — Encounter (HOSPITAL_COMMUNITY): Payer: Self-pay | Admitting: Internal Medicine

## 2019-11-17 DIAGNOSIS — Z923 Personal history of irradiation: Secondary | ICD-10-CM

## 2019-11-17 DIAGNOSIS — R935 Abnormal findings on diagnostic imaging of other abdominal regions, including retroperitoneum: Secondary | ICD-10-CM

## 2019-11-17 DIAGNOSIS — C252 Malignant neoplasm of tail of pancreas: Secondary | ICD-10-CM

## 2019-11-17 DIAGNOSIS — Z853 Personal history of malignant neoplasm of breast: Secondary | ICD-10-CM

## 2019-11-17 DIAGNOSIS — C787 Secondary malignant neoplasm of liver and intrahepatic bile duct: Secondary | ICD-10-CM

## 2019-11-17 DIAGNOSIS — E872 Acidosis: Secondary | ICD-10-CM

## 2019-11-17 DIAGNOSIS — A419 Sepsis, unspecified organism: Secondary | ICD-10-CM

## 2019-11-17 DIAGNOSIS — Z87891 Personal history of nicotine dependence: Secondary | ICD-10-CM

## 2019-11-17 LAB — HEPATIC FUNCTION PANEL
ALT: 16 U/L (ref 0–44)
AST: 24 U/L (ref 15–41)
Albumin: 2.4 g/dL — ABNORMAL LOW (ref 3.5–5.0)
Alkaline Phosphatase: 158 U/L — ABNORMAL HIGH (ref 38–126)
Bilirubin, Direct: 0.2 mg/dL (ref 0.0–0.2)
Indirect Bilirubin: 0.5 mg/dL (ref 0.3–0.9)
Total Bilirubin: 0.7 mg/dL (ref 0.3–1.2)
Total Protein: 6.4 g/dL — ABNORMAL LOW (ref 6.5–8.1)

## 2019-11-17 LAB — BASIC METABOLIC PANEL
Anion gap: 11 (ref 5–15)
BUN: 23 mg/dL (ref 8–23)
CO2: 17 mmol/L — ABNORMAL LOW (ref 22–32)
Calcium: 8.7 mg/dL — ABNORMAL LOW (ref 8.9–10.3)
Chloride: 109 mmol/L (ref 98–111)
Creatinine, Ser: 1.04 mg/dL — ABNORMAL HIGH (ref 0.44–1.00)
GFR calc Af Amer: 57 mL/min — ABNORMAL LOW (ref >60)
GFR calc non Af Amer: 49 mL/min — ABNORMAL LOW (ref >60)
Glucose, Bld: 95 mg/dL (ref 70–99)
Potassium: 4.8 mmol/L (ref 3.5–5.1)
Sodium: 137 mmol/L (ref 135–145)

## 2019-11-17 LAB — URINE CULTURE: Culture: NO GROWTH

## 2019-11-17 LAB — CBC
HCT: 26.9 % — ABNORMAL LOW (ref 36.0–46.0)
Hemoglobin: 8.6 g/dL — ABNORMAL LOW (ref 12.0–15.0)
MCH: 29 pg (ref 26.0–34.0)
MCHC: 32 g/dL (ref 30.0–36.0)
MCV: 90.6 fL (ref 80.0–100.0)
Platelets: 450 10*3/uL — ABNORMAL HIGH (ref 150–400)
RBC: 2.97 MIL/uL — ABNORMAL LOW (ref 3.87–5.11)
RDW: 13.7 % (ref 11.5–15.5)
WBC: 19.7 10*3/uL — ABNORMAL HIGH (ref 4.0–10.5)
nRBC: 0 % (ref 0.0–0.2)

## 2019-11-17 LAB — URINALYSIS, COMPLETE (UACMP) WITH MICROSCOPIC
Bilirubin Urine: NEGATIVE
Glucose, UA: NEGATIVE mg/dL
Hgb urine dipstick: NEGATIVE
Ketones, ur: NEGATIVE mg/dL
Leukocytes,Ua: NEGATIVE
Nitrite: NEGATIVE
Protein, ur: NEGATIVE mg/dL
Specific Gravity, Urine: 1.02 (ref 1.005–1.030)
pH: 5 (ref 5.0–8.0)

## 2019-11-17 LAB — TSH: TSH: 3.253 u[IU]/mL (ref 0.350–4.500)

## 2019-11-17 LAB — MAGNESIUM: Magnesium: 2 mg/dL (ref 1.7–2.4)

## 2019-11-17 LAB — PHOSPHORUS: Phosphorus: 2.8 mg/dL (ref 2.5–4.6)

## 2019-11-17 MED ORDER — PNEUMOCOCCAL VAC POLYVALENT 25 MCG/0.5ML IJ INJ
0.5000 mL | INJECTION | INTRAMUSCULAR | Status: AC
  Administered 2019-11-21: 0.5 mL via INTRAMUSCULAR
  Filled 2019-11-17 (×2): qty 0.5

## 2019-11-17 NOTE — Progress Notes (Addendum)
Daily Rounding Note  11/17/2019, 8:35 AM  LOS: 1 day   SUBJECTIVE:   Chief complaint: widespread metastatic dz of undetermined primary     Ate a little breakfast.  Pain in lower abdomen continues, not severe.  No nausea or vomiting.  Had a brown stool yesterday.  OBJECTIVE:         Vital signs in last 24 hours:    Temp:  [97.2 F (36.2 C)-98.3 F (36.8 C)] 98.2 F (36.8 C) (01/27 0830) Pulse Rate:  [64-108] 93 (01/27 0830) Resp:  [14-28] 18 (01/27 0830) BP: (72-135)/(40-97) 107/46 (01/27 0830) SpO2:  [93 %-100 %] 96 % (01/27 0830) Weight:  [43.8 kg-44.2 kg] 43.8 kg (01/26 2240) Last BM Date: 11/16/19 Filed Weights   11/16/19 1244 11/16/19 2240  Weight: 44.2 kg 43.8 kg   General: Thin, alert, comfortable Heart: RRR. Chest: Clear bilaterally.  No labored breathing. Abdomen: Soft.  Not tender.  No palpable masses or organomegaly.  Active bowel sounds Extremities: No CCE. Neuro/Psych: Appropriate.  Alert.  Intake/Output from previous day: 01/26 0701 - 01/27 0700 In: 1000 [IV Piggyback:1000] Out: 400 [Urine:400]  Intake/Output this shift: Total I/O In: 75 [P.O.:75] Out: 100 [Urine:100]  Lab Results: Recent Labs    11/16/19 1033 11/16/19 2333 11/17/19 0221  WBC 20.2 Repeated and verified X2.* 21.1* 19.7*  HGB 11.1* 9.7* 8.6*  HCT 34.3* 30.1* 26.9*  PLT 563.0* 289 450*   BMET Recent Labs    11/16/19 1033 11/16/19 2333 11/17/19 0221  NA 134*  --  137  K 5.4*  --  4.8  CL 102  --  109  CO2 19  --  17*  GLUCOSE 136*  --  95  BUN 34*  --  23  CREATININE 1.34* 1.13* 1.04*  CALCIUM 10.3  --  8.7*   LFT Recent Labs    11/16/19 1033  PROT 8.6*  ALBUMIN 3.7  AST 22  ALT 15  ALKPHOS 237*  BILITOT 0.6   PT/INR Recent Labs    11/16/19 1033  LABPROT 17.1*  INR 1.5*   Hepatitis Panel No results for input(s): HEPBSAG, HCVAB, HEPAIGM, HEPBIGM in the last 72 hours.  Studies/Results: CT  Angio Chest PE W/Cm &/Or Wo Cm  Result Date: 11/16/2019 CLINICAL DATA:  Lower abdominal pain with hypotension. EXAM: CT CHEST, ABDOMEN, AND PELVIS WITH CONTRAST TECHNIQUE: Multidetector CT imaging of the chest, abdomen and pelvis was performed following the standard protocol during bolus administration of intravenous contrast. CONTRAST:  52mL OMNIPAQUE IOHEXOL 350 MG/ML SOLN COMPARISON:  Abdomen and pelvis CT, dated October 27, 2019, is available. FINDINGS: CT CHEST FINDINGS Cardiovascular: There is marked severity calcification of the thoracic aorta. The pulmonary arteries are normal in appearance without evidence of intraluminal filling defects. Normal heart size. A very small (4 mm) anterior pericardial effusion is seen. Marked severity coronary artery calcification is seen. Mediastinum/Nodes: No enlarged mediastinal, hilar, or axillary lymph nodes. Thyroid gland, trachea, and esophagus demonstrate no significant findings. Lungs/Pleura: There is moderate severity emphysematous lung disease. Mild atelectasis is seen within the posterior aspect of the left upper lobe and bilateral lower lobes. There is no evidence of a pleural effusion or pneumothorax. Musculoskeletal: Multilevel degenerative changes seen throughout the thoracic spine. CT ABDOMEN PELVIS FINDINGS Hepatobiliary: Numerous heterogeneous low-attenuation liver lesions of various sizes are seen scattered throughout the right and left lobe. These areas are stable in size and appearance when compared to the prior study. No gallstones,  gallbladder wall thickening, or biliary dilatation. Pancreas: A predominant stable 6.1 cm x 5.6 cm heterogeneous low-attenuation soft tissue mass is seen along the region of the pancreatic tail and splenic hilum. A stable, adjacent 2.4 cm x 1.1 cm soft tissue mass is noted near the splenic flexure (axial CT image 20, CT series number 12). An additional stable 3.0 cm x 2.6 cm soft tissue mass is seen within the anterolateral  aspect of the mid to upper left abdomen (axial CT image 28, CT series number 12). Spleen: A large, stable area of heterogeneous attenuation is seen along the posterior aspect of the splenic parenchyma. Adrenals/Urinary Tract: The left and right adrenal glands are poorly visualized. Kidneys are normal, without renal calculi, focal lesion, or hydronephrosis. Bladder is unremarkable. Stomach/Bowel: Stomach is within normal limits. The appendix is not clearly identified. No evidence of bowel wall thickening, distention, or inflammatory changes. Vascular/Lymphatic: Marked severity aortic atherosclerosis. No enlarged abdominal or pelvic lymph nodes. Reproductive: Status post hysterectomy. No adnexal masses. Other: No abdominal wall hernia or abnormality. No abdominopelvic ascites. Musculoskeletal: Multilevel degenerative changes seen throughout the lumbar spine. IMPRESSION: 1. Moderate severity emphysematous lung disease. 2. Mild left upper lobe and bilateral lower lobe atelectasis. 3. Large stable soft tissue mass along the region of the pancreatic tail and splenic hilum, consistent with primary versus metastatic neoplastic disease. 4. Numerous stable liver lesions consistent with liver metastasis. 5. Stable soft tissue masses within the mid to upper left abdomen and region of the splenic flexure consistent with additional neoplastic lesions. 6. Stable findings likely consistent with an evolving splenic infarct. 7. No evidence of a retroperitoneal hematoma in the presence of lower abdominal pain and hypotension. Electronically Signed   By: Virgina Norfolk M.D.   On: 11/16/2019 18:24   CT Abdomen Pelvis W Contrast  Result Date: 11/16/2019 CLINICAL DATA:  Lower abdominal pain with hypotension. EXAM: CT CHEST, ABDOMEN, AND PELVIS WITH CONTRAST TECHNIQUE: Multidetector CT imaging of the chest, abdomen and pelvis was performed following the standard protocol during bolus administration of intravenous contrast. CONTRAST:   26mL OMNIPAQUE IOHEXOL 350 MG/ML SOLN COMPARISON:  Abdomen and pelvis CT, dated October 27, 2019, is available. FINDINGS: CT CHEST FINDINGS Cardiovascular: There is marked severity calcification of the thoracic aorta. The pulmonary arteries are normal in appearance without evidence of intraluminal filling defects. Normal heart size. A very small (4 mm) anterior pericardial effusion is seen. Marked severity coronary artery calcification is seen. Mediastinum/Nodes: No enlarged mediastinal, hilar, or axillary lymph nodes. Thyroid gland, trachea, and esophagus demonstrate no significant findings. Lungs/Pleura: There is moderate severity emphysematous lung disease. Mild atelectasis is seen within the posterior aspect of the left upper lobe and bilateral lower lobes. There is no evidence of a pleural effusion or pneumothorax. Musculoskeletal: Multilevel degenerative changes seen throughout the thoracic spine. CT ABDOMEN PELVIS FINDINGS Hepatobiliary: Numerous heterogeneous low-attenuation liver lesions of various sizes are seen scattered throughout the right and left lobe. These areas are stable in size and appearance when compared to the prior study. No gallstones, gallbladder wall thickening, or biliary dilatation. Pancreas: A predominant stable 6.1 cm x 5.6 cm heterogeneous low-attenuation soft tissue mass is seen along the region of the pancreatic tail and splenic hilum. A stable, adjacent 2.4 cm x 1.1 cm soft tissue mass is noted near the splenic flexure (axial CT image 20, CT series number 12). An additional stable 3.0 cm x 2.6 cm soft tissue mass is seen within the anterolateral aspect of the mid to upper  left abdomen (axial CT image 28, CT series number 12). Spleen: A large, stable area of heterogeneous attenuation is seen along the posterior aspect of the splenic parenchyma. Adrenals/Urinary Tract: The left and right adrenal glands are poorly visualized. Kidneys are normal, without renal calculi, focal lesion, or  hydronephrosis. Bladder is unremarkable. Stomach/Bowel: Stomach is within normal limits. The appendix is not clearly identified. No evidence of bowel wall thickening, distention, or inflammatory changes. Vascular/Lymphatic: Marked severity aortic atherosclerosis. No enlarged abdominal or pelvic lymph nodes. Reproductive: Status post hysterectomy. No adnexal masses. Other: No abdominal wall hernia or abnormality. No abdominopelvic ascites. Musculoskeletal: Multilevel degenerative changes seen throughout the lumbar spine. IMPRESSION: 1. Moderate severity emphysematous lung disease. 2. Mild left upper lobe and bilateral lower lobe atelectasis. 3. Large stable soft tissue mass along the region of the pancreatic tail and splenic hilum, consistent with primary versus metastatic neoplastic disease. 4. Numerous stable liver lesions consistent with liver metastasis. 5. Stable soft tissue masses within the mid to upper left abdomen and region of the splenic flexure consistent with additional neoplastic lesions. 6. Stable findings likely consistent with an evolving splenic infarct. 7. No evidence of a retroperitoneal hematoma in the presence of lower abdominal pain and hypotension. Electronically Signed   By: Virgina Norfolk M.D.   On: 11/16/2019 18:24    ASSESMENT:   *   Widespread metastatic disease per CTAP 10/27/2019.  Anorexia, weight loss, lower abdominal pain. Breast cancer with left mastectomy 2013, radiation, on Arimidex LFTs normal. Dr. Rush Landmark has reviewed imaging and feels that percutaneous liver biopsy would be the safest and most efficacious way to determine source of primary cancer.  *   Hep C, cirrhosis, SVR following Harvoni in 05/2016.  Followed by Cruzita Lederer at [facility] liver care. Was drinking 2 cans of beer daily but stopped 4 weeks ago.  *   05/2013 colonoscopy by Dr. Collene Mares with polypectomies x3. 10/2013 enteroscopy by Dr. Benson Norway.  Nonbleeding gastric, duodenal, proximal jejunal AVMs.  AVMs were  cauterized.  *   03/2014 enteroscopy antral and small bowel AVMs, one with fresh blood.  All visible AVMs cauterized.  *     COVID-19 negative  *    Sepsis, SIRS.  WBCs to 20 1K.  Elevated lactate, resolved..  On vancomycin, cefepime  *   AKI.  Mild.  *    Coagulopathy.  PT/INR 17.1/1.5.  Received 1 oral dose vitamin K last night.  Platelets well within normal limits.   PLAN   *  Consult IR for (liver ?) biopsy.  Because she is going to need biopsy, I discontinued Lovenox (she did not get the dose ordered for this morning) and will make her n.p.o.    [name]  11/17/2019, 8:35 AM Phone [phone]

## 2019-11-17 NOTE — Progress Notes (Signed)
PROGRESS NOTE    [name]  FUX:[id number] DOB: [birth year] DOA: 11/16/2019 PCP: Lucianne Lei, MD   Brief Narrative:  HPI per Dr. Dana Allan on 11/15/2018 Patient is an 86 year old female with past medical history significant for breast cancer, hypertension, hyperlipidemia, chronic abdominal pain, rheumatoid arthritis and chronic kidney disease stage IIIA/B.  Patient was seen by the GI provider earlier today for abdominal pain.  Apparently, patient has had abdominal pain for about 2 weeks.  Patient reported constant lower abdominal pain.  There is associated poor p.o. intake and poor appetite.  Patient reported significant weight loss.  On presentation to the GI provider's office, patient was found to be hypotensive, with systolic blood pressure in the 70s.  Patient was advised to come to the hospital for further assessment and management.  Lab work done at the hospital revealed CBC of 20.2, hemoglobin of 11.1, hematocrit of 34.3 and platelet count of 563.  Chemistry revealed sodium of 134, potassium of 5.4, chloride 102, CO2 of 19, BUN of 34 with serum creatinine of 1.3 from blood sugar of 136.  INR of 1.5 and lactic acid of 3.6 were reported as well.  D-dimer was elevated.  CTA of the chest and abdomen revealed lung emphysema, bilateral lower lung lobe atelectasis, large stable soft tissue mass around the tail of pancreas and numerous liver metastasis.  Evolving splenic infarct was also queried.  No headache, no neck, no chest pain, no shortness of breath, no no nausea vomiting or change in bowel habit.  Hospitalist team will admit patient for further assessment and management.  ED Course: On presentation to the emergency room, vitals revealed temperature of 97.3, blood pressure of 72/40 - 135/97 mmHg, heart rate of 64-1 8/min and respiratory rate of 1428.  O2 sat is 98% on room air.  Lab work is as documented above.  Patient has received vitamin K for elevated INR.  Patient has been  aggressively hydrated, pancultured and started on antibiotics.  The hospitalist team has been called to admit patient.  **Interim History  Patient had a repeat CT scan as above and IR was consulted for liver lesion biopsy given is the safest option and most efficacious way of determining source of her primary cancer given her multiple liver metastasis given that the GI had limited endoscopic options for diagnosis.  Palliative care was also called for goals of care consultation  Assessment & Plan:   Active Problems:   Sepsis ([place])  Sepis of Unclear etiology -Admit patient for further assessment and management. -Unclear etiology -She was hypotensive, tachycardic, tachypneic and had a leukocytosis and unclear source but she does have widespread disease; also admitted lactic acidosis on admission Continue with empiric antibiotics with IV vancomycin and IV cefepime for now and will check MRSA PCR and de-escalate and stop vancomycin if negative -Given 2 L of normal saline boluses and placed on maintenance IV fluid at 125 MLS per hour which I have decreased noted 100 mL/h -WBC remains elevated and today was 19.7 -WBC could be elevated in the setting of dehydration but went from 20.2 and today is now 19.7 -Continue to follow blood cultures -Urinalysis was unremarkable but did show some rare bacteria next-blood cultures and urine culture still pending  -CT of the Chest/Abdomen/Pelvis showed "Moderate severity emphysematous lung disease. Mild left upper lobe and bilateral lower lobe atelectasis.  Large stable soft tissue mass along the region of the pancreatic tail and splenic hilum, consistent with primary versus metastatic neoplastic disease. Numerous  stable liver lesions consistent with liver metastasis. Stable soft tissue masses within the mid to upper left abdomen and region of the splenic flexure consistent with additional neoplastic lesions. Stable findings likely consistent with an evolving  splenic infarct. No evidence of a retroperitoneal hematoma in the presence of lower abdominal pain and hypotension."  Elevated D-dimer -Had a CTA of the chest PE study which showed no Filling Defects -Will consider a LE Venous Duplex  Coagulopathy -Had an INR of 1.5 on Admission and given 5 mg po Vitamin K -Repeat INR in the AM   Hepatitis C with cirrhosis and SVR -She is status post Harvoni in 2017 -Followed by a hepatologist at [facility] next-previously was drinking 2 cans of beer daily but stopped  Widespread metastatic disease with liver lesions and anorexia, weight loss intermittent abdominal pain -Has a history of breast cancer status post radiation, left breast mastectomy as well as Arimidex and follows with Dr. Ladona Mow -Patient will need tissue diagnosis -Consult IR for liver biopsy and this is hopefully being done today -Also consulted medical oncology Dr. Lind Guest -Likely contributing to the abdominal pain. -Plan for consultants including IR and medical oncology  Hypotension  -Patient has had poor p.o. intake -Continue aggressive hydration will reduce rate from 125 mL's per hour to 100 MLS per hour; is status post 2 Liter Boluses -Monitor closely -Hold Amlodipine for now and continue monitor blood pressures per protocol -Blood pressure was on the lower side still at 107/46; presented with a blood pressure 72/40 -TSH was normal   Lactic Acidosis -The setting of sepsis of unknown etiology and dehydration -Improved as lactic acid level from 3.2 is now 1.9  AKI -In the setting of very poor p.o. intake and dehydration and hypotension -Patient's BUN/creatinine went from 34/1.34 and today was 22/1.04 and is improving -Continue with IV fluid hydration but will decrease maintenance rate from 125 mL of normal saline per hour 200 MLS per hour -Avoid nephrotoxic medications, contrast dyes, hypotension and renally adjust medications -Repeat CMP P in the a.m.  Metabolic  Acidosis -CO2 on admission was 19 and today was 17 -Given a total of 2 L fluid boluses and started on maintenance IV fluids at 125 mL's per hour and will drop to 100 mL/h -Continue to monitor and trend and repeat CMP in a.m.  Normocytic Anemia  -Patient's hemoglobin/hematocrit went from 11.1/34.3 and then dropped to 8.6/26.9; question if this could be a dilutional drop -Check anemia panel in the a.m. -Continue to monitor for symptoms of bleeding; currently no overt bleeding noted -Repeat CBC in a.m.  Thrombocytosis  -Lately count on admission was 563 and then dropped to 450; likely in the setting of her widespread metastatic disease and sepsis picture -Continue to monitor for signs and symptoms of bleeding -Repeat CBC in a.m.  Poor p.o. intake/Underweight -Consult Dietitian for further evaluation and recommendations   DVT prophylaxis: SCDs given that she will be going for Liver Biopsy  Code Status: FULL CODE   Family Communication:  Disposition Plan: Patient came from: Home  Anticipated d/c place: To be determined Barriers to d/c OR conditions which need to be met to effect a safe d/c: Further work-up with liver biopsy evaluation with oncology and IR as well as palliative care goals of care discussion  Consultants:   Gastroenterology Dr. Tarri Glenn  Medical Oncology Dr. Lindi Adie  Interventional Radiology  Palliative Care Medicine    Procedures: Liver Biopsy Scheduled    Antimicrobials: Anti-infectives (From admission, onward)   Start     Dose/Rate Route Frequency Ordered Stop   11/16/19 2030  ceFEPIme (MAXIPIME) 2 g in sodium chloride 0.9 % 100 mL IVPB     2 g 200 mL/hr over 30 Minutes Intravenous Every 24 hours 11/16/19 2017     11/16/19 2030  vancomycin (VANCOREADY) IVPB 750 mg/150 mL     750 mg 150 mL/hr over 60 Minutes Intravenous Every 48 hours  11/16/19 2017     11/16/19 1500  piperacillin-tazobactam (ZOSYN) IVPB 3.375 g     3.375 g 100 mL/hr over 30 Minutes Intravenous  Once 11/16/19 1456 11/16/19 1637     Subjective: Examined at bedside was still not feeling as great still having some intermittent abdominal pain.  Happy that her blood pressure is a little bit better.  No chest pain, lightheadedness or dizziness.  No nausea or vomiting.  Understands that she needs to go for a biopsy and states that she has had very poor p.o. intake for the last few weeks.  No other concerns or complaints at this time.  Objective: Vitals:   11/16/19 2130 11/16/19 2240 11/17/19 0437 11/17/19 0830  BP: (!) 135/97 119/60 (!) 112/50 (!) 107/46  Pulse: (!) 103 (!) 108 93 93  Resp: (!) 26 (!) 28 (!) 21 18  Temp:  98.3 F (36.8 C) 98.3 F (36.8 C) 98.2 F (36.8 C)  TempSrc:  Oral Oral Oral  SpO2: 95% 98% 96% 96%  Weight:  43.8 kg    Height:  '5\' 2"'  (1.575 m)      Intake/Output Summary (Last 24 hours) at 11/17/2019 0841 Last data filed at 11/17/2019 0827 Gross per 24 hour  Intake 1075 ml  Output 500 ml  Net 575 ml   Filed Weights   11/16/19 1244 11/16/19 2240  Weight: 44.2 kg 43.8 kg   Examination: Physical Exam:  Constitutional: Thin elderly African-American female currently in NAD and appears calm Eyes: Lids and conjunctivae normal, sclerae anicteric  ENMT: External Ears, Nose appear normal. Grossly normal hearing.  Neck: Appears normal, supple, no cervical masses, normal ROM, no appreciable thyromegaly; no JVD Respiratory: Slightly diminished to auscultation bilaterally, no wheezing, rales, rhonchi or crackles. Normal respiratory effort and patient is not tachypenic. No accessory muscle use.  Cardiovascular: RRR, no murmurs / rubs / gallops. S1 and S2 auscultated. No extremity edema Abdomen: Soft, mildly tender, non-distended.  Bowel sounds positive.  GU: Deferred. Musculoskeletal: No clubbing / cyanosis of digits/nails. No joint  deformity upper and lower extremities Skin: No rashes, lesions, ulcers on a limited skin evaluation. No induration; Warm and dry.  Neurologic: CN 2-12 grossly intact with no focal deficits. Romberg sign and cerebellar reflexes not assessed.  Psychiatric: Normal judgment and insight. Alert and oriented x 3. Normal mood and appropriate affect.   Data Reviewed: I have personally reviewed following labs and imaging studies  CBC: Recent Labs  Lab 11/16/19 1033 11/16/19 2333 11/17/19 0221  WBC 20.2 Repeated and verified X2.* 21.1* 19.7*  NEUTROABS 17.0*  --   --   HGB 11.1* 9.7*  8.6*  HCT 34.3* 30.1* 26.9*  MCV 88.9 87.8 90.6  PLT 563.0* 289 937*   Basic Metabolic Panel: Recent Labs  Lab 11/16/19 1033 11/16/19 2333 11/17/19 0221  NA 134*  --  137  K 5.4*  --  4.8  CL 102  --  109  CO2 19  --  17*  GLUCOSE 136*  --  95  BUN 34*  --  23  CREATININE 1.34* 1.13* 1.04*  CALCIUM 10.3  --  8.7*  MG  --  2.0  --   PHOS  --  2.8  --    GFR: Estimated Creatinine Clearance: 27.3 mL/min (A) (by C-G formula based on SCr of 1.04 mg/dL (H)). Liver Function Tests: Recent Labs  Lab 11/16/19 1033  AST 22  ALT 15  ALKPHOS 237*  BILITOT 0.6  PROT 8.6*  ALBUMIN 3.7   No results for input(s): LIPASE, AMYLASE in the last 168 hours. No results for input(s): AMMONIA in the last 168 hours. Coagulation Profile: Recent Labs  Lab 11/16/19 1033  INR 1.5*   Cardiac Enzymes: No results for input(s): CKTOTAL, CKMB, CKMBINDEX, TROPONINI in the last 168 hours. BNP (last 3 results) No results for input(s): PROBNP in the last 8760 hours. HbA1C: No results for input(s): HGBA1C in the last 72 hours. CBG: No results for input(s): GLUCAP in the last 168 hours. Lipid Profile: No results for input(s): CHOL, HDL, LDLCALC, TRIG, CHOLHDL, LDLDIRECT in the last 72 hours. Thyroid Function Tests: Recent Labs    11/16/19 2333  TSH 3.253   Anemia Panel: No results for input(s): VITAMINB12, FOLATE,  FERRITIN, TIBC, IRON, RETICCTPCT in the last 72 hours. Sepsis Labs: Recent Labs  Lab 11/16/19 1400 11/16/19 1634 11/16/19 2101  LATICACIDVEN 3.2* 3.6* 1.9    Recent Results (from the past 240 hour(s))  SARS CORONAVIRUS 2 (TAT 6-24 HRS) Nasopharyngeal Nasopharyngeal Swab     Status: None   Collection Time: 11/16/19  1:32 PM   Specimen: Nasopharyngeal Swab  Result Value Ref Range Status   SARS Coronavirus 2 NEGATIVE NEGATIVE Final    Comment: (NOTE) SARS-CoV-2 target nucleic acids are NOT DETECTED. The SARS-CoV-2 RNA is generally detectable in upper and lower respiratory specimens during the acute phase of infection. Negative results do not preclude SARS-CoV-2 infection, do not rule out co-infections with other pathogens, and should not be used as the sole basis for treatment or other patient management decisions. Negative results must be combined with clinical observations, patient history, and epidemiological information. The expected result is Negative. Fact Sheet for Patients: SugarRoll.be Fact Sheet for Healthcare Providers: https://www.woods-mathews.com/ This test is not yet approved or cleared by the [place] FDA and  has been authorized for detection and/or diagnosis of SARS-CoV-2 by FDA under an Emergency Use Authorization (EUA). This EUA will remain  in effect (meaning this test can be used) for the duration of the COVID-19 declaration under Section 56 4(b)(1) of the Act, 21 U.S.C. section 360bbb-3(b)(1), unless the authorization is terminated or revoked sooner. Performed at [facility], [place] [address] [place], [place] [address]     Radiology Studies: CT Angio Chest PE W/Cm &/Or Wo Cm  Result Date: 11/16/2019 CLINICAL DATA:  Lower abdominal pain with hypotension. EXAM: CT CHEST, ABDOMEN, AND PELVIS WITH CONTRAST TECHNIQUE: Multidetector CT imaging of the chest, abdomen and pelvis was performed following the  standard protocol during bolus administration of intravenous contrast. CONTRAST:  95m OMNIPAQUE IOHEXOL 350 MG/ML SOLN COMPARISON:  Abdomen and pelvis  CT, dated October 27, 2019, is available. FINDINGS: CT CHEST FINDINGS Cardiovascular: There is marked severity calcification of the thoracic aorta. The pulmonary arteries are normal in appearance without evidence of intraluminal filling defects. Normal heart size. A very small (4 mm) anterior pericardial effusion is seen. Marked severity coronary artery calcification is seen. Mediastinum/Nodes: No enlarged mediastinal, hilar, or axillary lymph nodes. Thyroid gland, trachea, and esophagus demonstrate no significant findings. Lungs/Pleura: There is moderate severity emphysematous lung disease. Mild atelectasis is seen within the posterior aspect of the left upper lobe and bilateral lower lobes. There is no evidence of a pleural effusion or pneumothorax. Musculoskeletal: Multilevel degenerative changes seen throughout the thoracic spine. CT ABDOMEN PELVIS FINDINGS Hepatobiliary: Numerous heterogeneous low-attenuation liver lesions of various sizes are seen scattered throughout the right and left lobe. These areas are stable in size and appearance when compared to the prior study. No gallstones, gallbladder wall thickening, or biliary dilatation. Pancreas: A predominant stable 6.1 cm x 5.6 cm heterogeneous low-attenuation soft tissue mass is seen along the region of the pancreatic tail and splenic hilum. A stable, adjacent 2.4 cm x 1.1 cm soft tissue mass is noted near the splenic flexure (axial CT image 20, CT series number 12). An additional stable 3.0 cm x 2.6 cm soft tissue mass is seen within the anterolateral aspect of the mid to upper left abdomen (axial CT image 28, CT series number 12). Spleen: A large, stable area of heterogeneous attenuation is seen along the posterior aspect of the splenic parenchyma. Adrenals/Urinary Tract: The left and right adrenal glands  are poorly visualized. Kidneys are normal, without renal calculi, focal lesion, or hydronephrosis. Bladder is unremarkable. Stomach/Bowel: Stomach is within normal limits. The appendix is not clearly identified. No evidence of bowel wall thickening, distention, or inflammatory changes. Vascular/Lymphatic: Marked severity aortic atherosclerosis. No enlarged abdominal or pelvic lymph nodes. Reproductive: Status post hysterectomy. No adnexal masses. Other: No abdominal wall hernia or abnormality. No abdominopelvic ascites. Musculoskeletal: Multilevel degenerative changes seen throughout the lumbar spine. IMPRESSION: 1. Moderate severity emphysematous lung disease. 2. Mild left upper lobe and bilateral lower lobe atelectasis. 3. Large stable soft tissue mass along the region of the pancreatic tail and splenic hilum, consistent with primary versus metastatic neoplastic disease. 4. Numerous stable liver lesions consistent with liver metastasis. 5. Stable soft tissue masses within the mid to upper left abdomen and region of the splenic flexure consistent with additional neoplastic lesions. 6. Stable findings likely consistent with an evolving splenic infarct. 7. No evidence of a retroperitoneal hematoma in the presence of lower abdominal pain and hypotension. Electronically Signed   By: Virgina Norfolk M.D.   On: 11/16/2019 18:24   CT Abdomen Pelvis W Contrast  Result Date: 11/16/2019 CLINICAL DATA:  Lower abdominal pain with hypotension. EXAM: CT CHEST, ABDOMEN, AND PELVIS WITH CONTRAST TECHNIQUE: Multidetector CT imaging of the chest, abdomen and pelvis was performed following the standard protocol during bolus administration of intravenous contrast. CONTRAST:  75m OMNIPAQUE IOHEXOL 350 MG/ML SOLN COMPARISON:  Abdomen and pelvis CT, dated October 27, 2019, is available. FINDINGS: CT CHEST FINDINGS Cardiovascular: There is marked severity calcification of the thoracic aorta. The pulmonary arteries are normal in  appearance without evidence of intraluminal filling defects. Normal heart size. A very small (4 mm) anterior pericardial effusion is seen. Marked severity coronary artery calcification is seen. Mediastinum/Nodes: No enlarged mediastinal, hilar, or axillary lymph nodes. Thyroid gland, trachea, and esophagus demonstrate no significant findings. Lungs/Pleura: There is moderate severity emphysematous lung  disease. Mild atelectasis is seen within the posterior aspect of the left upper lobe and bilateral lower lobes. There is no evidence of a pleural effusion or pneumothorax. Musculoskeletal: Multilevel degenerative changes seen throughout the thoracic spine. CT ABDOMEN PELVIS FINDINGS Hepatobiliary: Numerous heterogeneous low-attenuation liver lesions of various sizes are seen scattered throughout the right and left lobe. These areas are stable in size and appearance when compared to the prior study. No gallstones, gallbladder wall thickening, or biliary dilatation. Pancreas: A predominant stable 6.1 cm x 5.6 cm heterogeneous low-attenuation soft tissue mass is seen along the region of the pancreatic tail and splenic hilum. A stable, adjacent 2.4 cm x 1.1 cm soft tissue mass is noted near the splenic flexure (axial CT image 20, CT series number 12). An additional stable 3.0 cm x 2.6 cm soft tissue mass is seen within the anterolateral aspect of the mid to upper left abdomen (axial CT image 28, CT series number 12). Spleen: A large, stable area of heterogeneous attenuation is seen along the posterior aspect of the splenic parenchyma. Adrenals/Urinary Tract: The left and right adrenal glands are poorly visualized. Kidneys are normal, without renal calculi, focal lesion, or hydronephrosis. Bladder is unremarkable. Stomach/Bowel: Stomach is within normal limits. The appendix is not clearly identified. No evidence of bowel wall thickening, distention, or inflammatory changes. Vascular/Lymphatic: Marked severity aortic  atherosclerosis. No enlarged abdominal or pelvic lymph nodes. Reproductive: Status post hysterectomy. No adnexal masses. Other: No abdominal wall hernia or abnormality. No abdominopelvic ascites. Musculoskeletal: Multilevel degenerative changes seen throughout the lumbar spine. IMPRESSION: 1. Moderate severity emphysematous lung disease. 2. Mild left upper lobe and bilateral lower lobe atelectasis. 3. Large stable soft tissue mass along the region of the pancreatic tail and splenic hilum, consistent with primary versus metastatic neoplastic disease. 4. Numerous stable liver lesions consistent with liver metastasis. 5. Stable soft tissue masses within the mid to upper left abdomen and region of the splenic flexure consistent with additional neoplastic lesions. 6. Stable findings likely consistent with an evolving splenic infarct. 7. No evidence of a retroperitoneal hematoma in the presence of lower abdominal pain and hypotension. Electronically Signed   By: Virgina Norfolk M.D.   On: 11/16/2019 18:24   Scheduled Meds: . enoxaparin (LOVENOX) injection  20 mg Subcutaneous Q24H   Continuous Infusions: . sodium chloride 125 mL/hr at 11/17/19 0729  . ceFEPime (MAXIPIME) IV Stopped (11/16/19 2115)  . vancomycin Stopped (11/16/19 2230)    LOS: 1 day   Kerney Elbe, DO [facility] PAGER is on [place]  If 7PM-7AM, please contact night-coverage [email or web address]

## 2019-11-17 NOTE — Progress Notes (Signed)
11/16/2019 2230 Received pt to room 4E-22 from ED.  Pt A&Ox4, some C/O abdominal pain.  Tele monitor applied and CCMD notified.  Oriented to room, call light and bed.  Call bell in reach. [place]

## 2019-11-17 NOTE — Addendum Note (Signed)
Addended by: Jerene Bears on: 11/17/2019 10:15 AM   Modules accepted: Level of Service

## 2019-11-17 NOTE — Consult Note (Signed)
Chief Complaint: Patient was seen in consultation today for liver lesion/biopsy.  Referring Physician(s): Vena Rua  Supervising Physician: Daryll Brod  Patient Status: [name] - In-pt  History of Present Illness: [name] is a 86 y.o. female with a past medical history of hypertension, lipid disorder, hepatitis, renal insufficiency, breast cancer s/p partial mastectomy and radiaiton therapy, anemia of chronic disease, OA, gout, and anxiety. Of note, patient underwent CT abdomen/pelvis on 10/27/2019 for evaluation of abdominal pain x 1 week, which revealed a large mass of the pancreas along with multiple liver lesions and abdominal soft tissue masses, concerning for metastatic disease. She had plans for outpatient liver lesion biopsy in IR in early 11/2019 at the request of Dr. Criss Rosales. She presented to [facility] ED at the request of her gastroenterologist for evaluation of hypotension and abdominal pain. In ED, patient met sepsis criteria and was admitted for further management. GI was consulted who recommended IR consultation for possible liver lesion biopsy for tissue diagnosis.  CT abdomen/pelvis 10/27/2019: 1. Large mass in the splenic hilum and pancreatic tail, associated with masses in the liver and in the peritoneum and likely with colonic serosal disease at the splenic flexure. Findings may represent a primary pancreatic neoplasm with metastatic disease versus abdominal metastatic disease from breast cancer or similar process. 2. No overt signs of obstruction though there is some mild to moderate distension of the cecum and evidence of serosal disease at the splenic flexure of the colon. Correlate with any history that would suggest developing obstruction and attention on follow-up. 3. Left adrenal nodularity is suspected though left adrenal is not clearly seen on today's study. 4. Atherosclerosis and emphysema. 5. A call is out to the referring provider to further discuss  findings in the above case.  CT abdomen/pelvis 11/16/2019: 1. Moderate severity emphysematous lung disease. 2. Mild left upper lobe and bilateral lower lobe atelectasis. 3. Large stable soft tissue mass along the region of the pancreatic tail and splenic hilum, consistent with primary versus metastatic neoplastic disease. 4. Numerous stable liver lesions consistent with liver metastasis. 5. Stable soft tissue masses within the mid to upper left abdomen and region of the splenic flexure consistent with additional neoplastic lesions. 6. Stable findings likely consistent with an evolving splenic infarct. 7. No evidence of a retroperitoneal hematoma in the presence of lower abdominal pain and hypotension.  IR requested by Azucena Freed, PA-C for possible image-guided liver lesion biopsy. Patient awake and alert laying in bed. Complains of intermittent abdominal pain, rated 8/10. States she currently does not have abdominal pain, however had some "sharp pains" earlier this AM. Denies fever, chills, chest pain, dyspnea, or headache.   Past Medical History:  Diagnosis Date  . Abdominal pain 04/2016  . Anemia of chronic disease   . Anxiety   . Breast cancer ([place]) 08/28/12   IDC left breast bx=invasive ductal ca,ER/PR=+  . Breast cancer ([place]) 09/01/2012  . Hepatitis   . Hypertension   . Lipid disorder   . RA (rheumatoid arthritis) ([place])   . Radiation 11/12/12-12/15/11   Left Breast/50 Gy  . Renal insufficiency   . Wears dentures    top  . Wears glasses     Past Surgical History:  Procedure Laterality Date  . ABDOMINAL HYSTERECTOMY      approx 20 years ago  . APPENDECTOMY     20 years ago  . bones spur     removed 20 years ago  . COLONOSCOPY  2009?  [name]  ENTEROSCOPY N/A 11/08/2013   Procedure: ENTEROSCOPY;  Surgeon: Beryle Beams, MD;  Location: [facility] ENDOSCOPY;  Service: Endoscopy;  Laterality: N/A;  . ENTEROSCOPY N/A 04/18/2014   Procedure: ENTEROSCOPY;  Surgeon: Beryle Beams, MD;   Location: [facility] ENDOSCOPY;  Service: Endoscopy;  Laterality: N/A;  . FOOT SURGERY  1992   bone spurs both feet  . PARTIAL MASTECTOMY WITH NEEDLE LOCALIZATION AND AXILLARY SENTINEL LYMPH NODE BX  10/08/2012   Procedure: PARTIAL MASTECTOMY WITH NEEDLE LOCALIZATION AND AXILLARY SENTINEL LYMPH NODE BX;  Surgeon: Adin Hector, MD;  Location: [place];  Service: General;  Laterality: Left;  Left partial mastectomy with Needle localization and Sentinel lymph node biospy    Allergies: Patient has no known allergies.  Medications: Prior to Admission medications   Medication Sig Start Date End Date Taking? Authorizing Provider  allopurinol (ZYLOPRIM) 100 MG tablet Take 1 tablet (100 mg total) by mouth daily. 10/11/19  Yes Deveshwar, Abel Presto, MD  amLODipine (NORVASC) 5 MG tablet Take 5 mg by mouth daily.  01/23/18  Yes [provider]  anastrozole (ARIMIDEX) 1 MG tablet Take 1 tablet by mouth daily. 11/11/19  Yes Nicholas Lose, MD  irbesartan (AVAPRO) 150 MG tablet Take 150 mg by mouth daily.  01/23/18  Yes [provider]  metoCLOPramide (REGLAN) 5 MG tablet Take 1 tablet by mouth TID as needed for nausea and vomiting.  Take 15 to 20 minutes before a meal. Patient taking differently: Take 5 mg by mouth every 6 (six) hours as needed for nausea or vomiting.  10/12/19  Yes Nicholas Lose, MD  omeprazole (PRILOSEC) 20 MG capsule Take 1 capsule (20 mg total) by mouth daily. Patient not taking: Reported on 11/16/2019 11/16/19   Noralyn Pick, NP     Family History  Problem Relation Age of Onset  . Cancer Father   . Prostate cancer Father   . Tuberculosis Mother   . Breast cancer Cousin     Social History   Socioeconomic History  . Marital status: Widowed    Spouse name: Not on file  . Number of children: 3  . Years of education: Not on file  . Highest education level: Not on file  Occupational History    Employer: RETIRED  Tobacco Use  . Smoking status:  Former Smoker    Packs/day: 1.00    Types: Cigarettes    Quit date: 10/27/2007    Years since quitting: 12.0  . Smokeless tobacco: Never Used  . Tobacco comment: started smoking age 56  Substance and Sexual Activity  . Alcohol use: Not Currently    Comment: rarely  . Drug use: No  . Sexual activity: Not Currently  Other Topics Concern  . Not on file  Social History Narrative   Widowed lives with one of her sons   3 sons and 2 daughters   Menses age 47 or 77   Age 79 with first child   No breast feeding   No HRT   Social Determinants of Health   Financial Resource Strain:   . Difficulty of Paying Living Expenses: Not on file  Food Insecurity:   . Worried About Charity fundraiser in the Last Year: Not on file  . Ran Out of Food in the Last Year: Not on file  Transportation Needs:   . Lack of Transportation (Medical): Not on file  . Lack of Transportation (Non-Medical): Not on file  Physical Activity:   . Days of Exercise  per Week: Not on file  . Minutes of Exercise per Session: Not on file  Stress:   . Feeling of Stress : Not on file  Social Connections:   . Frequency of Communication with Friends and Family: Not on file  . Frequency of Social Gatherings with Friends and Family: Not on file  . Attends Religious Services: Not on file  . Active Member of Clubs or Organizations: Not on file  . Attends Archivist Meetings: Not on file  . Marital Status: Not on file     Review of Systems: A 12 point ROS discussed and pertinent positives are indicated in the HPI above.  All other systems are negative.  Review of Systems  Constitutional: Negative for chills and fever.  Respiratory: Negative for shortness of breath and wheezing.   Cardiovascular: Negative for chest pain and palpitations.  Gastrointestinal: Positive for abdominal pain.  Neurological: Negative for headaches.  Psychiatric/Behavioral: Negative for behavioral problems and confusion.    Vital  Signs: BP (!) 108/41 (BP Location: Right Arm)   Pulse 83   Temp 98.5 F (36.9 C) (Oral)   Resp 19   Ht _0  (1.575 m)   Wt 96 lb 8 oz (43.8 kg)   SpO2 96%   BMI 17.65 kg/m   Physical Exam Vitals and nursing note reviewed.  Constitutional:      General: She is not in acute distress.    Appearance: Normal appearance.  Cardiovascular:     Rate and Rhythm: Normal rate and regular rhythm.     Heart sounds: Normal heart sounds. No murmur.  Pulmonary:     Effort: Pulmonary effort is normal. No respiratory distress.     Breath sounds: Normal breath sounds. No wheezing.  Skin:    General: Skin is warm and dry.  Neurological:     Mental Status: She is alert and oriented to person, place, and time.  Psychiatric:        Mood and Affect: Mood normal.        Behavior: Behavior normal.      MD Evaluation Airway: WNL Heart: WNL Abdomen: WNL Chest/ Lungs: WNL ASA  Classification: 3 Mallampati/Airway Score: Two   Imaging: CT Angio Chest PE W/Cm &/Or Wo Cm  Result Date: 11/16/2019 CLINICAL DATA:  Lower abdominal pain with hypotension. EXAM: CT CHEST, ABDOMEN, AND PELVIS WITH CONTRAST TECHNIQUE: Multidetector CT imaging of the chest, abdomen and pelvis was performed following the standard protocol during bolus administration of intravenous contrast. CONTRAST:  25m OMNIPAQUE IOHEXOL 350 MG/ML SOLN COMPARISON:  Abdomen and pelvis CT, dated October 27, 2019, is available. FINDINGS: CT CHEST FINDINGS Cardiovascular: There is marked severity calcification of the thoracic aorta. The pulmonary arteries are normal in appearance without evidence of intraluminal filling defects. Normal heart size. A very small (4 mm) anterior pericardial effusion is seen. Marked severity coronary artery calcification is seen. Mediastinum/Nodes: No enlarged mediastinal, hilar, or axillary lymph nodes. Thyroid gland, trachea, and esophagus demonstrate no significant findings. Lungs/Pleura: There is moderate severity  emphysematous lung disease. Mild atelectasis is seen within the posterior aspect of the left upper lobe and bilateral lower lobes. There is no evidence of a pleural effusion or pneumothorax. Musculoskeletal: Multilevel degenerative changes seen throughout the thoracic spine. CT ABDOMEN PELVIS FINDINGS Hepatobiliary: Numerous heterogeneous low-attenuation liver lesions of various sizes are seen scattered throughout the right and left lobe. These areas are stable in size and appearance when compared to the prior study. No gallstones, gallbladder wall thickening,  or biliary dilatation. Pancreas: A predominant stable 6.1 cm x 5.6 cm heterogeneous low-attenuation soft tissue mass is seen along the region of the pancreatic tail and splenic hilum. A stable, adjacent 2.4 cm x 1.1 cm soft tissue mass is noted near the splenic flexure (axial CT image 20, CT series number 12). An additional stable 3.0 cm x 2.6 cm soft tissue mass is seen within the anterolateral aspect of the mid to upper left abdomen (axial CT image 28, CT series number 12). Spleen: A large, stable area of heterogeneous attenuation is seen along the posterior aspect of the splenic parenchyma. Adrenals/Urinary Tract: The left and right adrenal glands are poorly visualized. Kidneys are normal, without renal calculi, focal lesion, or hydronephrosis. Bladder is unremarkable. Stomach/Bowel: Stomach is within normal limits. The appendix is not clearly identified. No evidence of bowel wall thickening, distention, or inflammatory changes. Vascular/Lymphatic: Marked severity aortic atherosclerosis. No enlarged abdominal or pelvic lymph nodes. Reproductive: Status post hysterectomy. No adnexal masses. Other: No abdominal wall hernia or abnormality. No abdominopelvic ascites. Musculoskeletal: Multilevel degenerative changes seen throughout the lumbar spine. IMPRESSION: 1. Moderate severity emphysematous lung disease. 2. Mild left upper lobe and bilateral lower lobe  atelectasis. 3. Large stable soft tissue mass along the region of the pancreatic tail and splenic hilum, consistent with primary versus metastatic neoplastic disease. 4. Numerous stable liver lesions consistent with liver metastasis. 5. Stable soft tissue masses within the mid to upper left abdomen and region of the splenic flexure consistent with additional neoplastic lesions. 6. Stable findings likely consistent with an evolving splenic infarct. 7. No evidence of a retroperitoneal hematoma in the presence of lower abdominal pain and hypotension. Electronically Signed   By: Virgina Norfolk M.D.   On: 11/16/2019 18:24   CT Abdomen Pelvis W Contrast  Result Date: 11/16/2019 CLINICAL DATA:  Lower abdominal pain with hypotension. EXAM: CT CHEST, ABDOMEN, AND PELVIS WITH CONTRAST TECHNIQUE: Multidetector CT imaging of the chest, abdomen and pelvis was performed following the standard protocol during bolus administration of intravenous contrast. CONTRAST:  42m OMNIPAQUE IOHEXOL 350 MG/ML SOLN COMPARISON:  Abdomen and pelvis CT, dated October 27, 2019, is available. FINDINGS: CT CHEST FINDINGS Cardiovascular: There is marked severity calcification of the thoracic aorta. The pulmonary arteries are normal in appearance without evidence of intraluminal filling defects. Normal heart size. A very small (4 mm) anterior pericardial effusion is seen. Marked severity coronary artery calcification is seen. Mediastinum/Nodes: No enlarged mediastinal, hilar, or axillary lymph nodes. Thyroid gland, trachea, and esophagus demonstrate no significant findings. Lungs/Pleura: There is moderate severity emphysematous lung disease. Mild atelectasis is seen within the posterior aspect of the left upper lobe and bilateral lower lobes. There is no evidence of a pleural effusion or pneumothorax. Musculoskeletal: Multilevel degenerative changes seen throughout the thoracic spine. CT ABDOMEN PELVIS FINDINGS Hepatobiliary: Numerous  heterogeneous low-attenuation liver lesions of various sizes are seen scattered throughout the right and left lobe. These areas are stable in size and appearance when compared to the prior study. No gallstones, gallbladder wall thickening, or biliary dilatation. Pancreas: A predominant stable 6.1 cm x 5.6 cm heterogeneous low-attenuation soft tissue mass is seen along the region of the pancreatic tail and splenic hilum. A stable, adjacent 2.4 cm x 1.1 cm soft tissue mass is noted near the splenic flexure (axial CT image 20, CT series number 12). An additional stable 3.0 cm x 2.6 cm soft tissue mass is seen within the anterolateral aspect of the mid to upper left abdomen (axial  CT image 28, CT series number 12). Spleen: A large, stable area of heterogeneous attenuation is seen along the posterior aspect of the splenic parenchyma. Adrenals/Urinary Tract: The left and right adrenal glands are poorly visualized. Kidneys are normal, without renal calculi, focal lesion, or hydronephrosis. Bladder is unremarkable. Stomach/Bowel: Stomach is within normal limits. The appendix is not clearly identified. No evidence of bowel wall thickening, distention, or inflammatory changes. Vascular/Lymphatic: Marked severity aortic atherosclerosis. No enlarged abdominal or pelvic lymph nodes. Reproductive: Status post hysterectomy. No adnexal masses. Other: No abdominal wall hernia or abnormality. No abdominopelvic ascites. Musculoskeletal: Multilevel degenerative changes seen throughout the lumbar spine. IMPRESSION: 1. Moderate severity emphysematous lung disease. 2. Mild left upper lobe and bilateral lower lobe atelectasis. 3. Large stable soft tissue mass along the region of the pancreatic tail and splenic hilum, consistent with primary versus metastatic neoplastic disease. 4. Numerous stable liver lesions consistent with liver metastasis. 5. Stable soft tissue masses within the mid to upper left abdomen and region of the splenic  flexure consistent with additional neoplastic lesions. 6. Stable findings likely consistent with an evolving splenic infarct. 7. No evidence of a retroperitoneal hematoma in the presence of lower abdominal pain and hypotension. Electronically Signed   By: Virgina Norfolk M.D.   On: 11/16/2019 18:24   CT ABDOMEN PELVIS W CONTRAST  Addendum Date: 10/27/2019   ADDENDUM REPORT: 10/27/2019 10:28 ADDENDUM: These results were called by telephone at the time of interpretation on 10/27/2019 at 10:28 am to provider [facility] , who verbally acknowledged these results. Electronically Signed   By: Zetta Bills M.D.   On: 10/27/2019 10:28   Result Date: 10/27/2019 CLINICAL DATA:  Lower abdominal pain for 1 week. History of hysterectomy and left breast cancer with lumpectomy and radiation. EXAM: CT ABDOMEN AND PELVIS WITH CONTRAST TECHNIQUE: Multidetector CT imaging of the abdomen and pelvis was performed using the standard protocol following bolus administration of intravenous contrast. CONTRAST:  173m ISOVUE-300 IOPAMIDOL (ISOVUE-300) INJECTION 61% Creatinine was obtained on site at [facility] [address] Results: Creatinine 1.0 mg/dL. COMPARISON:  02/07/2017 FINDINGS: Lower chest: Signs of pulmonary emphysema. No signs of consolidation or pleural effusion. Hepatobiliary: Multiple hepatic masses, largest in the left hepatic lobe measures is 4.0 x 3.6 cm. The no subsegment is spared this process. Lesions ranging between a cm and 2-1/2 cm are noted in the right hepatic lobe. At least 10 lesions are present in the right hepatic lobe. The portal vein is patent. Hepatic veins are patent. Pancreas: Mass lesion associated with spleen and pancreas, pancreatic tail tracking in the splenic hilum measuring approximately 5.4 x 5.4 cm (image 16, series 2) remainder of the pancreas is normal. Spleen: Spleen with signs of splenic infarct likely due to vascular compromise at the level of the splenic hilum due to  large mass. Adrenals/Urinary Tract: And there may be a small left adrenal nodule measuring about 4-5 mm in thickness. This is not clear. Left kidney is in close proximity to left upper quadrant mass. Right kidney displaced inferiorly by mild hepatic enlargement and is mildly ptotic, no signs of suspicious renal mass. Small cysts in the bilateral kidneys. No hydronephrosis. Stomach/Bowel: Moderate cecal distension of approximately 6 cm. Gradual transition of the colon but with presumed serosal disease at the splenic flexure and signs of perisplenic implants. Bowel is partially opacified with enteric contrast on today's exam. There is a pericolonic soft tissue implant measuring 2.6 cm near the splenic flexure. There is nodularity  in this location as well with another small nodule adjacent to the spleen and splenic flexure, (image 15, series 2 2.3 cm. Question of some gastric antral thickening as well but the dominant areas of abnormality are distant from this location. Vascular/Lymphatic: Moderate calcific and non calcified atherosclerotic plaque throughout the abdominal aorta. No signs of aneurysm. No adenopathy in the retroperitoneum or upper abdomen. No signs of pelvic lymphadenopathy. Reproductive: Post hysterectomy. Other: Signs of pelvic floor dysfunction with bowing of levator in on musculature. No signs of abdominal wall hernia. Musculoskeletal: No sign of acute bone process or destructive bone lesion. Spinal degenerative changes with grade 1 anterolisthesis of L4 on L5. IMPRESSION: 1. Large mass in the splenic hilum and pancreatic tail, associated with masses in the liver and in the peritoneum and likely with colonic serosal disease at the splenic flexure. Findings may represent a primary pancreatic neoplasm with metastatic disease versus abdominal metastatic disease from breast cancer or similar process. 2. No overt signs of obstruction though there is some mild to moderate distension of the cecum and  evidence of serosal disease at the splenic flexure of the colon. Correlate with any history that would suggest developing obstruction and attention on follow-up. 3. Left adrenal nodularity is suspected though left adrenal is not clearly seen on today's study. 4. Atherosclerosis and emphysema. 5. A call is out to the referring provider to further discuss findings in the above case. Aortic Atherosclerosis (ICD10-I70.0) and Emphysema (ICD10-J43.9). Electronically Signed: By: Zetta Bills M.D. On: 10/27/2019 10:22    Labs:  CBC: Recent Labs    05/24/19 0926 11/16/19 1033 11/17/19 0221  WBC 5.4 20.2 Repeated and verified X2.* 19.7*  HGB 12.1 11.1* 8.6*  HCT 35.5 34.3* 26.9*  PLT 197 563.0* 450*    COAGS: Recent Labs    11/16/19 1033  INR 1.5*    BMP: Recent Labs    05/24/19 0926 11/16/19 1033 11/17/19 0221  NA 134* 134* 137  K 4.8 5.4* 4.8  CL 102 102 109  CO2 24 19 17*  GLUCOSE 101* 136* 95  BUN 20 34* 23  CALCIUM 9.7 10.3 8.7*  CREATININE 1.07* 1.34* 1.04*  GFRNONAA 47*  --  49*  GFRAA 55*  --  57*    LIVER FUNCTION TESTS: Recent Labs    05/24/19 0926 11/16/19 1033 11/17/19 0221  BILITOT 0.5 0.6 0.7  AST _0 ALT _1 ALKPHOS  --  237* 158*  PROT 7.9 8.6* 6.4*  ALBUMIN  --  3.7 2.4*     Assessment and Plan:  Liver lesions. Plan for image-guided liver lesion biopsy tentatively for today in IR pending scheduling. Patient last ate at 0800- ok to proceed after 1400 per IR protocol. Afebrile. She does not take blood thinners. INR 1.5 11/16/2019- ok to proceed per Dr. Annamaria Boots.  Risks and benefits discussed with the patient including, but not limited to bleeding, infection, damage to adjacent structures or low yield requiring additional tests. All of the patient's questions were answered, patient is agreeable to proceed. Consent signed and in chart.   Thank you for this interesting consult.  I greatly enjoyed meeting SHATERRIA SAGER and look  forward to participating in their care.  A copy of this report was sent to the requesting provider on this date.  Electronically Signed: Earley Abide, PA-C 11/17/2019, 11:37 AM   I spent a total of 40 Minutes in face to face in clinical consultation, greater than 50% of which  was counseling/coordinating care for liver lesion/biopsy.

## 2019-11-17 NOTE — Consult Note (Signed)
[place] CONSULT NOTE  Patient Care Team: Lucianne Lei, MD as PCP - General (Family Medicine)  CHIEF COMPLAINTS/PURPOSE OF CONSULTATION:  Metastatic carcinoma  HISTORY OF PRESENTING ILLNESS:  [name] 86 y.o. female is here because of abdominal pain for about 2 weeks.  She also lost significant amount of weight and came into the emergency room with low blood pressure.  CT chest abdomen pelvis revealed a large mass of the tail of the pancreas and numerous liver metastases.  There was also evidence of splenic infarct.  She is scheduled to undergo interventional radiology guided liver biopsy. I had not seen her for several years but previously in 2013 she had breast cancer on the left side treated with lumpectomy radiation and antiestrogen therapy.  I reviewed her records extensively and collaborated the history with the patient.  SUMMARY OF ONCOLOGIC HISTORY: Oncology History  Breast cancer, left breast ([place])  08/25/2012 Initial Diagnosis   Breast cancer, left breast: Invasive ductal carcinoma grade 1-2 ER/PR positive HER-2 negative Ki-67 14%   10/08/2012 Surgery   Left lumpectomy and sentinel lymph node biopsy: 2.1 cm invasive ductal carcinoma 3 sentinel lymph nodes negative posterior margin reexcision ER positive PR positive HER-2 negative Ki-67 14% grade 1-2   11/10/2012 - 12/11/2012 Radiation Therapy   Adjuvant radiation therapy   12/28/2012 -  Anti-estrogen oral therapy   Arimidex 1 mg daily      MEDICAL HISTORY:  Past Medical History:  Diagnosis Date  . Abdominal pain 04/2016  . Anemia of chronic disease   . Anxiety   . Breast cancer ([place]) 08/28/12   IDC left breast bx=invasive ductal ca,ER/PR=+  . Breast cancer ([place]) 09/01/2012  . Hepatitis   . Hypertension   . Lipid disorder   . RA (rheumatoid arthritis) ([place])   . Radiation 11/12/12-12/15/11   Left Breast/50 Gy  . Renal insufficiency   . Wears dentures    top  . Wears glasses      SURGICAL HISTORY: Past Surgical History:  Procedure Laterality Date  . ABDOMINAL HYSTERECTOMY      approx 20 years ago  . APPENDECTOMY     20 years ago  . bones spur     removed 20 years ago  . COLONOSCOPY  2009?  . ENTEROSCOPY N/A 11/08/2013   Procedure: ENTEROSCOPY;  Surgeon: Beryle Beams, MD;  Location: [facility] ENDOSCOPY;  Service: Endoscopy;  Laterality: N/A;  . ENTEROSCOPY N/A 04/18/2014   Procedure: ENTEROSCOPY;  Surgeon: Beryle Beams, MD;  Location: [facility] ENDOSCOPY;  Service: Endoscopy;  Laterality: N/A;  . FOOT SURGERY  1992   bone spurs both feet  . PARTIAL MASTECTOMY WITH NEEDLE LOCALIZATION AND AXILLARY SENTINEL LYMPH NODE BX  10/08/2012   Procedure: PARTIAL MASTECTOMY WITH NEEDLE LOCALIZATION AND AXILLARY SENTINEL LYMPH NODE BX;  Surgeon: Adin Hector, MD;  Location: [facility];  Service: General;  Laterality: Left;  Left partial mastectomy with Needle localization and Sentinel lymph node biospy    SOCIAL HISTORY: Social History   Socioeconomic History  . Marital status: Widowed    Spouse name: Not on file  . Number of children: 3  . Years of education: Not on file  . Highest education level: Not on file  Occupational History    Employer: RETIRED  Tobacco Use  . Smoking status: Former Smoker    Packs/day: 1.00    Types: Cigarettes    Quit date: 10/27/2007    Years since quitting: 12.0  .  Smokeless tobacco: Never Used  . Tobacco comment: started smoking age 25  Substance and Sexual Activity  . Alcohol use: Not Currently    Comment: rarely  . Drug use: No  . Sexual activity: Not Currently  Other Topics Concern  . Not on file  Social History Narrative   Widowed lives with one of her sons   3 sons and 2 daughters   Menses age 22 or 89   Age 40 with first child   No breast feeding   No HRT   Social Determinants of Health   Financial Resource Strain:   . Difficulty of Paying Living Expenses: Not on file  Food Insecurity:   . Worried  About Charity fundraiser in the Last Year: Not on file  . Ran Out of Food in the Last Year: Not on file  Transportation Needs:   . Lack of Transportation (Medical): Not on file  . Lack of Transportation (Non-Medical): Not on file  Physical Activity:   . Days of Exercise per Week: Not on file  . Minutes of Exercise per Session: Not on file  Stress:   . Feeling of Stress : Not on file  Social Connections:   . Frequency of Communication with Friends and Family: Not on file  . Frequency of Social Gatherings with Friends and Family: Not on file  . Attends Religious Services: Not on file  . Active Member of Clubs or Organizations: Not on file  . Attends Archivist Meetings: Not on file  . Marital Status: Not on file  Intimate Partner Violence:   . Fear of Current or Ex-Partner: Not on file  . Emotionally Abused: Not on file  . Physically Abused: Not on file  . Sexually Abused: Not on file    FAMILY HISTORY: Family History  Problem Relation Age of Onset  . Cancer Father   . Prostate cancer Father   . Tuberculosis Mother   . Breast cancer Cousin     ALLERGIES:  has No Known Allergies.  MEDICATIONS:  Current Facility-Administered Medications  Medication Dose Route Frequency Provider Last Rate Last Admin  . 0.9 %  sodium chloride infusion   Intravenous Continuous Raiford Noble [place], DO 100 mL/hr at 11/17/19 1120 Rate Change at 11/17/19 1120  . ceFEPIme (MAXIPIME) 2 g in sodium chloride 0.9 % 100 mL IVPB  2 g Intravenous Q24H Duanne Limerick, [facility]   Stopped at 11/16/19 2115  . [START ON 11/18/2019] pneumococcal 23 valent vaccine (PNEUMOVAX-23) injection 0.5 mL  0.5 mL Intramuscular Tomorrow-1000 Sheikh, Omair Latif, DO      . vancomycin (VANCOREADY) IVPB 750 mg/150 mL  750 mg Intravenous Q48H Duanne Limerick, [facility]   Stopped at 11/16/19 2230    REVIEW OF SYSTEMS:   Constitutional: Denies fevers, chills or abnormal night sweats Eyes: Denies blurriness of vision, double vision or  watery eyes Ears, nose, mouth, throat, and face: Denies mucositis or sore throat Respiratory: Denies cough, dyspnea or wheezes Cardiovascular: Denies palpitation, chest discomfort or lower extremity swelling Gastrointestinal: Abdominal pain and weight loss Skin: Denies abnormal skin rashes Lymphatics: Denies new lymphadenopathy or easy bruising Neurological:Denies numbness, tingling or new weaknesses Behavioral/Psych: Mood is stable, no new changes   All other systems were reviewed with the patient and are negative.  PHYSICAL EXAMINATION: ECOG PERFORMANCE STATUS: 3 - Symptomatic, >50% confined to bed  Vitals:   11/17/19 0830 11/17/19 1117  BP: (!) 107/46 (!) 108/41  Pulse: 93 83  Resp: 18 19  Temp: 98.2 F (36.8 C) 98.5 F (36.9 C)  SpO2: 96% 96%   Filed Weights   11/16/19 1244 11/16/19 2240  Weight: 97 lb 6.4 oz (44.2 kg) 96 lb 8 oz (43.8 kg)    GENERAL:alert, no distress and comfortable SKIN: skin color, texture, turgor are normal, no rashes or significant lesions EYES: normal, conjunctiva are pink and non-injected, sclera clear OROPHARYNX:no exudate, no erythema and lips, buccal mucosa, and tongue normal  NECK: supple, thyroid normal size, non-tender, without nodularity LYMPH:  no palpable lymphadenopathy in the cervical, axillary or inguinal LUNGS: clear to auscultation and percussion with normal breathing effort HEART: regular rate & rhythm and no murmurs and no lower extremity edema ABDOMEN:abdomen soft, non-tender and normal bowel sounds Musculoskeletal:no cyanosis of digits and no clubbing  PSYCH: alert & oriented x 3 with fluent speech NEURO: no focal motor/sensory deficits   LABORATORY DATA:  I have reviewed the data as listed Lab Results  Component Value Date   WBC 19.7 (H) 11/17/2019   HGB 8.6 (L) 11/17/2019   HCT 26.9 (L) 11/17/2019   MCV 90.6 11/17/2019   PLT 450 (H) 11/17/2019   Lab Results  Component Value Date   NA 137 11/17/2019   K 4.8  11/17/2019   CL 109 11/17/2019   CO2 17 (L) 11/17/2019    RADIOGRAPHIC STUDIES: I have personally reviewed the radiological reports and agreed with the findings in the report.  ASSESSMENT AND PLAN:   1.  Metastatic carcinoma: Large pancreatic mass with liver metastases makes [place] concerned that this could be metastatic pancreatic cancer.  However other cancers cannot be ruled out including metastatic breast cancer.  Given the favorable nature of her breast cancer and her antiestrogen therapy it would be extremely unusual to have her metastatic breast cancer. We will follow up on the results of the biopsy to determine prognosis.  If it is metastatic pancreatic cancer prognosis is extremely poor and that we would not offer systemic chemotherapy.  Hospice care would then be indicated.  If it is metastatic breast cancer then we can consider systemic treatment options. 2.  Hepatitis C with cirrhosis 3.  Early stage breast cancer 2013 4.  Normocytic anemia possibly due to the acute illness/malignancy Thank you very much for involving [place] in her care.  Further recommendations after the biopsy results are available. I called and left a message for the patient's son.  All questions were answered. The patient knows to call the clinic with any problems, questions or concerns.    Harriette Ohara, MD _0 @

## 2019-11-18 ENCOUNTER — Inpatient Hospital Stay (HOSPITAL_COMMUNITY): Payer: Medicare Other

## 2019-11-18 DIAGNOSIS — K8689 Other specified diseases of pancreas: Secondary | ICD-10-CM

## 2019-11-18 DIAGNOSIS — E43 Unspecified severe protein-calorie malnutrition: Secondary | ICD-10-CM | POA: Insufficient documentation

## 2019-11-18 DIAGNOSIS — C787 Secondary malignant neoplasm of liver and intrahepatic bile duct: Secondary | ICD-10-CM

## 2019-11-18 DIAGNOSIS — Z7189 Other specified counseling: Secondary | ICD-10-CM

## 2019-11-18 DIAGNOSIS — R161 Splenomegaly, not elsewhere classified: Secondary | ICD-10-CM

## 2019-11-18 DIAGNOSIS — Z515 Encounter for palliative care: Secondary | ICD-10-CM

## 2019-11-18 LAB — CBC WITH DIFFERENTIAL/PLATELET
Abs Immature Granulocytes: 0.24 10*3/uL — ABNORMAL HIGH (ref 0.00–0.07)
Abs Immature Granulocytes: 0 10*3/uL (ref 0.00–0.07)
Basophils Absolute: 0 10*3/uL (ref 0.0–0.1)
Basophils Absolute: 0 10*3/uL (ref 0.0–0.1)
Basophils Relative: 0 %
Basophils Relative: 0 %
Eosinophils Absolute: 0.2 10*3/uL (ref 0.0–0.5)
Eosinophils Absolute: 0 10*3/uL (ref 0.0–0.5)
Eosinophils Relative: 1 %
Eosinophils Relative: 0 %
HCT: 23.4 % — ABNORMAL LOW (ref 36.0–46.0)
HCT: 22.8 % — ABNORMAL LOW (ref 36.0–46.0)
Hemoglobin: 7.4 g/dL — ABNORMAL LOW (ref 12.0–15.0)
Hemoglobin: 7.1 g/dL — ABNORMAL LOW (ref 12.0–15.0)
Immature Granulocytes: 2 %
Lymphocytes Relative: 9 %
Lymphocytes Relative: 3 %
Lymphs Abs: 1.4 10*3/uL (ref 0.7–4.0)
Lymphs Abs: 0.8 10*3/uL (ref 0.7–4.0)
MCH: 28.4 pg (ref 26.0–34.0)
MCH: 28.5 pg (ref 26.0–34.0)
MCHC: 31.6 g/dL (ref 30.0–36.0)
MCHC: 31.1 g/dL (ref 30.0–36.0)
MCV: 89.7 fL (ref 80.0–100.0)
MCV: 91.6 fL (ref 80.0–100.0)
Monocytes Absolute: 1.9 10*3/uL — ABNORMAL HIGH (ref 0.1–1.0)
Monocytes Absolute: 1.5 10*3/uL — ABNORMAL HIGH (ref 0.1–1.0)
Monocytes Relative: 12 %
Monocytes Relative: 6 %
Neutro Abs: 11.6 10*3/uL — ABNORMAL HIGH (ref 1.7–7.7)
Neutro Abs: 23.3 10*3/uL — ABNORMAL HIGH (ref 1.7–7.7)
Neutrophils Relative %: 76 %
Neutrophils Relative %: 91 %
Platelets: 394 10*3/uL (ref 150–400)
Platelets: 387 10*3/uL (ref 150–400)
RBC: 2.61 MIL/uL — ABNORMAL LOW (ref 3.87–5.11)
RBC: 2.49 MIL/uL — ABNORMAL LOW (ref 3.87–5.11)
RDW: 13.9 % (ref 11.5–15.5)
RDW: 13.9 % (ref 11.5–15.5)
WBC: 15.3 10*3/uL — ABNORMAL HIGH (ref 4.0–10.5)
WBC: 25.6 10*3/uL — ABNORMAL HIGH (ref 4.0–10.5)
nRBC: 0 % (ref 0.0–0.2)
nRBC: 0 % (ref 0.0–0.2)
nRBC: 0 /100 WBC

## 2019-11-18 LAB — COMPREHENSIVE METABOLIC PANEL
ALT: 12 U/L (ref 0–44)
ALT: 13 U/L (ref 0–44)
AST: 20 U/L (ref 15–41)
AST: 21 U/L (ref 15–41)
Albumin: 1.9 g/dL — ABNORMAL LOW (ref 3.5–5.0)
Albumin: 2 g/dL — ABNORMAL LOW (ref 3.5–5.0)
Alkaline Phosphatase: 131 U/L — ABNORMAL HIGH (ref 38–126)
Alkaline Phosphatase: 133 U/L — ABNORMAL HIGH (ref 38–126)
Anion gap: 6 (ref 5–15)
Anion gap: 12 (ref 5–15)
BUN: 13 mg/dL (ref 8–23)
BUN: 12 mg/dL (ref 8–23)
CO2: 17 mmol/L — ABNORMAL LOW (ref 22–32)
CO2: 14 mmol/L — ABNORMAL LOW (ref 22–32)
Calcium: 8.2 mg/dL — ABNORMAL LOW (ref 8.9–10.3)
Calcium: 8.1 mg/dL — ABNORMAL LOW (ref 8.9–10.3)
Chloride: 115 mmol/L — ABNORMAL HIGH (ref 98–111)
Chloride: 112 mmol/L — ABNORMAL HIGH (ref 98–111)
Creatinine, Ser: 0.84 mg/dL (ref 0.44–1.00)
Creatinine, Ser: 0.84 mg/dL (ref 0.44–1.00)
GFR calc Af Amer: >60 mL/min (ref >60)
GFR calc Af Amer: >60 mL/min (ref >60)
GFR calc non Af Amer: >60 mL/min (ref >60)
GFR calc non Af Amer: >60 mL/min (ref >60)
Glucose, Bld: 94 mg/dL (ref 70–99)
Glucose, Bld: 96 mg/dL (ref 70–99)
Potassium: 3.6 mmol/L (ref 3.5–5.1)
Potassium: 3.8 mmol/L (ref 3.5–5.1)
Sodium: 138 mmol/L (ref 135–145)
Sodium: 138 mmol/L (ref 135–145)
Total Bilirubin: 0.4 mg/dL (ref 0.3–1.2)
Total Bilirubin: 0.9 mg/dL (ref 0.3–1.2)
Total Protein: 5.5 g/dL — ABNORMAL LOW (ref 6.5–8.1)
Total Protein: 5.6 g/dL — ABNORMAL LOW (ref 6.5–8.1)

## 2019-11-18 LAB — LACTIC ACID, PLASMA
Lactic Acid, Venous: 1.7 mmol/L (ref 0.5–1.9)
Lactic Acid, Venous: 1.5 mmol/L (ref 0.5–1.9)

## 2019-11-18 LAB — PHOSPHORUS
Phosphorus: 2.6 mg/dL (ref 2.5–4.6)
Phosphorus: 2.4 mg/dL — ABNORMAL LOW (ref 2.5–4.6)

## 2019-11-18 LAB — PROTIME-INR
INR: 1.3 — ABNORMAL HIGH (ref 0.8–1.2)
Prothrombin Time: 16 seconds — ABNORMAL HIGH (ref 11.4–15.2)

## 2019-11-18 LAB — MRSA PCR SCREENING: MRSA by PCR: NEGATIVE

## 2019-11-18 LAB — PROCALCITONIN: Procalcitonin: 2.9 ng/mL

## 2019-11-18 LAB — MAGNESIUM
Magnesium: 1.7 mg/dL (ref 1.7–2.4)
Magnesium: 1.4 mg/dL — ABNORMAL LOW (ref 1.7–2.4)

## 2019-11-18 MED ORDER — SODIUM CHLORIDE 0.9 % IV BOLUS
500.0000 mL | Freq: Once | INTRAVENOUS | Status: AC
  Administered 2019-11-18: 500 mL via INTRAVENOUS

## 2019-11-18 MED ORDER — ACETAMINOPHEN 650 MG RE SUPP
650.0000 mg | Freq: Four times a day (QID) | RECTAL | Status: DC | PRN
  Administered 2019-11-18: 650 mg via RECTAL
  Filled 2019-11-18: qty 1

## 2019-11-18 MED ORDER — MIDAZOLAM HCL 2 MG/2ML IJ SOLN
INTRAMUSCULAR | Status: AC | PRN
  Administered 2019-11-18 (×2): 0.5 mg via INTRAVENOUS

## 2019-11-18 MED ORDER — SODIUM BICARBONATE-DEXTROSE 150-5 MEQ/L-% IV SOLN
150.0000 meq | INTRAVENOUS | Status: DC
  Administered 2019-11-18 – 2019-11-19 (×2): 150 meq via INTRAVENOUS
  Filled 2019-11-18 (×2): qty 1000

## 2019-11-18 MED ORDER — MIDAZOLAM HCL 2 MG/2ML IJ SOLN
INTRAMUSCULAR | Status: AC
  Filled 2019-11-18: qty 2

## 2019-11-18 MED ORDER — ACETAMINOPHEN 325 MG PO TABS
650.0000 mg | ORAL_TABLET | Freq: Four times a day (QID) | ORAL | Status: DC | PRN
  Administered 2019-11-18 – 2019-11-25 (×8): 650 mg via ORAL
  Filled 2019-11-18 (×8): qty 2

## 2019-11-18 MED ORDER — LIDOCAINE HCL (PF) 1 % IJ SOLN
INTRAMUSCULAR | Status: AC
  Filled 2019-11-18: qty 30

## 2019-11-18 MED ORDER — GELATIN ABSORBABLE 12-7 MM EX MISC
CUTANEOUS | Status: AC
  Filled 2019-11-18: qty 1

## 2019-11-18 MED ORDER — FENTANYL CITRATE (PF) 100 MCG/2ML IJ SOLN
INTRAMUSCULAR | Status: AC
  Filled 2019-11-18: qty 2

## 2019-11-18 MED ORDER — FENTANYL CITRATE (PF) 100 MCG/2ML IJ SOLN
INTRAMUSCULAR | Status: AC | PRN
  Administered 2019-11-18: 25 ug via INTRAVENOUS

## 2019-11-18 MED ORDER — SODIUM CHLORIDE 0.9 % IV SOLN
INTRAVENOUS | Status: AC | PRN
  Administered 2019-11-18: 10 mL/h via INTRAVENOUS

## 2019-11-18 NOTE — Sedation Documentation (Signed)
Patient has respirations in the 30's and HR around 120.  Patient states she is cold and shivering.  Discussed with Dr. Anselm Pancoast to postpone patient until patient is stable to undergo biopsy.  RN on the floor made aware.  This RN and a nursing student delivering the patient back to the floor and patient was placed on the monitor.

## 2019-11-18 NOTE — Consult Note (Signed)
Consultation Note Date: 11/18/2019   Patient Name: [name]  DOB: [birth year]  MRN: [id number]  Age / Sex: 86 y.o., female  PCP: Kelly Lei, MD Referring Physician: Kerney Elbe, DO  Reason for Consultation: Establishing goals of care  HPI/Patient Profile: 86 y.o. female with past medical history of breast cancer 2013 s/p lumpectomy, radiation, and antiestrogen therapy, hypertension, hyperlipidemia, chronic abdominal pain, RA, CKD stage III, hepatitis C and liver cirrhosis admitted on 11/16/2019 with abdominal pain x2 weeks, poor oral intake, and weight loss. CTA of the chest/abdomen revealed lung emphysema, bilateral lower lung lobe atelectasis, large stable soft tissue mass around the tail of pancreas and numerous liver metastasis with evidence of splenic infarct. IR performed liver biopsy which is pending. Oncology following. Per Dr. Lindi [name], CTA concerning for metastatic pancreatic cancer (which would indicate extremely poor prognosis and would not offer systemic chemotherapy) or other primary, possibly metastatic breast cancer. Awaiting results of biopsy. Palliative medicine consultation for goals of care.   Clinical Assessment and Goals of Care:  I have reviewed medical records, discussed with care team, and met with patient at bedside. [name] is awake, alert, oriented and able to participate in discussion but with flat affect and withdrawn this morning. She denies current pain. She does not engage in conversation and with short responses to questions.   She lives with her son, [name]. Prior to a few weeks ago when abdominal pain/weight loss began, patient was independent. She has two other sons that are local.   She understands diagnoses and plan of care after conversations with providers including oncology. She speaks of plan for liver biopsy and trying not to 'dwell' on this until  more information is determined.   Briefly discussed advance directive, but again patient does not engage in discussion. She does tell me that [name] is her POA.   Explored how she is coping with this new information. She has been communicating with friends and family via telephone. Also has many individuals praying for her.   Patient denies questions or concerns at this time. Patient gives me permission to call her son, [name] to provide update.    **Shortly after spoke with son, [name] via telephone.  I introduced Palliative Medicine as specialized medical care for people living with serious illness. It focuses on providing relief from the symptoms and stress of a serious illness. The goal is to improve quality of life for both the patient and the family.  Discussed events leading up to admission and course of hospitalization including diagnoses, interventions, and plan of care. He understands plan for liver biopsy and waiting on primary cancer diagnosis before further recommendations can be given by oncology.   Briefly discussed advance directive. [name] shares that she has not documented an advance directive but has verbalized she would want him to be POA. He is hopeful for her to be able to receive the 'best treatment' possible (if any options) but also shares that "I wouldn't want her to be a vegetable" or put her  through invasive surgery that would not change the outcome.   Reassured of ongoing support from care team including PMT when biopsy is resulted.    SUMMARY OF RECOMMENDATIONS    Initial palliative discussion. Patient is withdrawn and does not engage in conversation today.   Pending liver biopsy and further oncology recommendations when biopsy has resulted.   Will need further GOC and code status discussions pending biopsy/oncolgy input.   Code Status/Advance Care Planning:  Full code  Symptom Management:   Per attending  Palliative Prophylaxis:   Aspiration,  Delirium Protocol, Frequent Pain Assessment, Oral Care and Turn Reposition  Psycho-social/Spiritual:   Desire for further Chaplaincy support: yes  Additional Recommendations: Caregiving  Support/Resources, Compassionate Wean Education and Education on Hospice  Prognosis:   Unable to determine: pending liver biopsy  Discharge Planning: To Be Determined      Primary Diagnoses: Present on Admission: . Sepsis ([place])   I have reviewed the medical record, interviewed the patient and family, and examined the patient. The following aspects are pertinent.  Past Medical History:  Diagnosis Date  . Abdominal pain 04/2016  . Anemia of chronic disease   . Anxiety   . Breast cancer ([place]) 08/28/12   IDC left breast bx=invasive ductal ca,ER/PR=+  . Breast cancer ([place]) 09/01/2012  . Hepatitis   . Hypertension   . Lipid disorder   . RA (rheumatoid arthritis) ([place])   . Radiation 11/12/12-12/15/11   Left Breast/50 Gy  . Renal insufficiency   . Wears dentures    top  . Wears glasses    Social History   Socioeconomic History  . Marital status: Widowed    Spouse name: Not on file  . Number of children: 3  . Years of education: Not on file  . Highest education level: Not on file  Occupational History    Employer: RETIRED  Tobacco Use  . Smoking status: Former Smoker    Packs/day: 1.00    Types: Cigarettes    Quit date: 10/27/2007    Years since quitting: 12.0  . Smokeless tobacco: Never Used  . Tobacco comment: started smoking age 53  Substance and Sexual Activity  . Alcohol use: Not Currently    Comment: rarely  . Drug use: No  . Sexual activity: Not Currently  Other Topics Concern  . Not on file  Social History Narrative   Widowed lives with one of her sons   3 sons and 2 daughters   Menses age 46 or 73   Age 49 with first child   No breast feeding   No HRT   Social Determinants of Health   Financial Resource Strain:   . Difficulty of Paying Living Expenses: Not on  file  Food Insecurity:   . Worried About Charity fundraiser in the Last Year: Not on file  . Ran Out of Food in the Last Year: Not on file  Transportation Needs:   . Lack of Transportation (Medical): Not on file  . Lack of Transportation (Non-Medical): Not on file  Physical Activity:   . Days of Exercise per Week: Not on file  . Minutes of Exercise per Session: Not on file  Stress:   . Feeling of Stress : Not on file  Social Connections:   . Frequency of Communication with Friends and Family: Not on file  . Frequency of Social Gatherings with Friends and Family: Not on file  . Attends Religious Services: Not on file  . Active Member of  Clubs or Organizations: Not on file  . Attends Archivist Meetings: Not on file  . Marital Status: Not on file   Family History  Problem Relation Age of Onset  . Cancer Father   . Prostate cancer Father   . Tuberculosis Mother   . Breast cancer Cousin    Scheduled Meds: . pneumococcal 23 valent vaccine  0.5 mL Intramuscular Tomorrow-1000   Continuous Infusions: . sodium chloride 100 mL/hr at 11/18/19 0352  . ceFEPime (MAXIPIME) IV 2 g (11/17/19 2027)  . vancomycin Stopped (11/16/19 2230)   PRN Meds:.acetaminophen **OR** acetaminophen Medications Prior to Admission:  Prior to Admission medications   Medication Sig Start Date End Date Taking? Authorizing Provider  allopurinol (ZYLOPRIM) 100 MG tablet Take 1 tablet (100 mg total) by mouth daily. 10/11/19  Yes Deveshwar, Abel Presto, MD  amLODipine (NORVASC) 5 MG tablet Take 5 mg by mouth daily.  01/23/18  Yes [provider]  anastrozole (ARIMIDEX) 1 MG tablet Take 1 tablet by mouth daily. 11/11/19  Yes Nicholas Lose, MD  irbesartan (AVAPRO) 150 MG tablet Take 150 mg by mouth daily.  01/23/18  Yes [provider]  metoCLOPramide (REGLAN) 5 MG tablet Take 1 tablet by mouth TID as needed for nausea and vomiting.  Take 15 to 20 minutes before a meal. Patient taking differently:  Take 5 mg by mouth every 6 (six) hours as needed for nausea or vomiting.  10/12/19  Yes Nicholas Lose, MD  omeprazole (PRILOSEC) 20 MG capsule Take 1 capsule (20 mg total) by mouth daily. Patient not taking: Reported on 11/16/2019 11/16/19   Noralyn Pick, NP   No Known Allergies Review of Systems  Constitutional: Positive for activity change, appetite change and unexpected weight change.  Gastrointestinal: Positive for abdominal pain.   Physical Exam Vitals and nursing note reviewed.  Constitutional:      General: She is awake.     Appearance: She is ill-appearing.  HENT:     Head: Normocephalic and atraumatic.  Cardiovascular:     Rate and Rhythm: Normal rate.  Pulmonary:     Effort: No tachypnea, accessory muscle usage or respiratory distress.     Comments: 1L Abdominal:     Tenderness: There is no abdominal tenderness.  Skin:    General: Skin is warm.  Neurological:     Mental Status: She is alert and oriented to person, place, and time.  Psychiatric:        Mood and Affect: Affect is flat.     Comments: withdrawn     Vital Signs: BP (!) 114/46 (BP Location: Right Arm) Comment: RN notified  Pulse 90   Temp 98.8 F (37.1 C) (Oral)   Resp 20   Ht '5\' 2"'  (1.575 m)   Wt 43.8 kg   SpO2 94%   BMI 17.65 kg/m  Pain Scale: 0-10   Pain Score: 0-No pain   SpO2: SpO2: 94 % O2 Device:SpO2: 94 % O2 Flow Rate: .O2 Flow Rate (L/min): 2 L/min  IO: Intake/output summary:   Intake/Output Summary (Last 24 hours) at 11/18/2019 0856 Last data filed at 11/18/2019 0429 Gross per 24 hour  Intake 2358.29 ml  Output 1250 ml  Net 1108.29 ml    LBM: Last BM Date: 11/16/19 Baseline Weight: Weight: 44.2 kg Most recent weight: Weight: 43.8 kg     Palliative Assessment/Data: PPS 50%     Time In/Out: 5681-2751, 1140-1200 Time Total: 50 Greater than 50%  of this time was spent counseling  and coordinating care related to the above assessment and plan.  Signed by:   Ihor Dow, DNP, FNP-C Palliative Medicine Team  Phone: [phone] Fax: [phone]   Please contact Palliative Medicine Team phone at [phone] for questions and concerns.  For individual provider: See Shea Evans

## 2019-11-18 NOTE — Progress Notes (Signed)
Page Jerilynn Mages. Denny N.P. on call . Patient has Temp.101.1 No PRN's on her med. profile at this time.

## 2019-11-18 NOTE — Progress Notes (Signed)
Pt received from IR. Pt tachycardic, tachypnic, and shaking, Fever of 101.5 oral. EKG obtained. Oral tylenol given. Will continue to monitor.  Clyde Canterbury, RN

## 2019-11-18 NOTE — Plan of Care (Signed)
  Problem: Safety: Goal: Ability to remain free from injury will improve Outcome: Progressing   Problem: Nutrition: Goal: Adequate nutrition will be maintained Outcome: Not Progressing

## 2019-11-18 NOTE — Progress Notes (Signed)
Patient ID: [name], female   DOB: 03-Nov-1931, 86 y.o.   MRN: [id number] Patient brought to [place] for liver biopsy.  Patient is tachypneic and tachycardiac (HR 120s).  These findings appear to be new.  In addition, hbg is 7.4 today and 11.1 on 1/26.  Question bleeding?  Discussed with Dr. Alfredia Ferguson.  Liver biopsy is on hold for now.

## 2019-11-18 NOTE — Progress Notes (Signed)
Initial Nutrition Assessment  DOCUMENTATION CODES:   Severe malnutrition in context of chronic illness  INTERVENTION:  Monitor for diet advancement and order supplements as needed.   NUTRITION DIAGNOSIS:   Severe Malnutrition related to chronic illness(metastatic carcinoma) as evidenced by percent weight loss, severe fat depletion, moderate fat depletion, severe muscle depletion.   GOAL:   Patient will meet greater than or equal to 90% of their needs   MONITOR:   Diet advancement, Weight trends, Labs  REASON FOR ASSESSMENT:   Consult Assessment of nutrition requirement/status, Poor PO, Diet education  ASSESSMENT:   Pt with a PMH significant for breast Ca, HTN, HLD, RA, CKD stage 3 C/O abdominal pain x2 weeks. Pt admitted with sepsis and found to have large pancreatic mass with numerous liver metastases with evidence of splenic infarct.  RN reports pt was briefly on a diet yesterday; states pt only ate bites of food, but did well with drinking water. 10% PO documented.  Pt reports poor appetite/PO intake for 2 weeks. Prior to this, she would eat the following: Boost and a banana for breakfast, deli meat sandwich for lunch, and "whatever my son fixes" for dinner (states it has protein, carb, and vegetable).   Admit wt: 43.8 kg/96.36 lbs.  Pt noted to have a 17% wt loss x4 months, which is clinically significant for time frame. Pt endorses this wt loss and states her UBW is 115-120 lbs.   Medications reviewed. Labs reviewed.  UOP: 1,327mL x24 hours I/O: +1,683.2mL since admit   NUTRITION - FOCUSED PHYSICAL EXAM:    Most Recent Value  Orbital Region  Moderate depletion  Upper Arm Region  Severe depletion  Thoracic and Lumbar Region  Severe depletion  Buccal Region  Severe depletion  Temple Region  Severe depletion  Clavicle Bone Region  Severe depletion  Clavicle and Acromion Bone Region  Severe depletion  Scapular Bone Region  Severe depletion  Dorsal Hand   Severe depletion  Patellar Region  Severe depletion  Anterior Thigh Region  Severe depletion  Posterior Calf Region  Severe depletion  Edema (RD Assessment)  None  Hair  Reviewed  Eyes  Reviewed  Mouth  Reviewed  Skin  Reviewed  Nails  Reviewed       Diet Order:   Diet Order            Diet NPO time specified  Diet effective midnight              EDUCATION NEEDS:   Not appropriate for education at this time  Skin:  Skin Assessment: Reviewed RN Assessment  Last BM:  1/26  Height:   Wt Readings from Last 5 Encounters:  11/16/19 43.8 kg  11/16/19 44.2 kg  07/15/19 52.8 kg  11/12/18 54.9 kg  04/15/18 54.7 kg     Weight:   Wt Readings from Last 1 Encounters:  11/16/19 43.8 kg    Ideal Body Weight:  50 kg  BMI:  Body mass index is 17.65 kg/m.  Estimated Nutritional Needs:   Kcal:  1300-1500  Protein:  65-75 grams  Fluid:  >1.3L/d    Larkin Ina, MS, RD, LDN Pager: [phone] Weekend/After Hours Pager: [phone]

## 2019-11-18 NOTE — Progress Notes (Signed)
PROGRESS NOTE    ALIZABETH ANTONIO  QIH:[id number] DOB: [birth year] DOA: 11/16/2019 PCP: Lucianne Lei, MD   Brief Narrative:  HPI per Dr. Dana Allan on 11/15/2018 Patient is an 86 year old female with past medical history significant for breast cancer, hypertension, hyperlipidemia, chronic abdominal pain, rheumatoid arthritis and chronic kidney disease stage IIIA/B.  Patient was seen by the GI provider earlier today for abdominal pain.  Apparently, patient has had abdominal pain for about 2 weeks.  Patient reported constant lower abdominal pain.  There is associated poor p.o. intake and poor appetite.  Patient reported significant weight loss.  On presentation to the GI provider's office, patient was found to be hypotensive, with systolic blood pressure in the 70s.  Patient was advised to come to the hospital for further assessment and management.  Lab work done at the hospital revealed CBC of 20.2, hemoglobin of 11.1, hematocrit of 34.3 and platelet count of 563.  Chemistry revealed sodium of 134, potassium of 5.4, chloride 102, CO2 of 19, BUN of 34 with serum creatinine of 1.3 from blood sugar of 136.  INR of 1.5 and lactic acid of 3.6 were reported as well.  D-dimer was elevated.  CTA of the chest and abdomen revealed lung emphysema, bilateral lower lung lobe atelectasis, large stable soft tissue mass around the tail of pancreas and numerous liver metastasis.  Evolving splenic infarct was also queried.  No headache, no neck, no chest pain, no shortness of breath, no no nausea vomiting or change in bowel habit.  Hospitalist team will admit patient for further assessment and management.  ED Course: On presentation to the emergency room, vitals revealed temperature of 97.3, blood pressure of 72/40 - 135/97 mmHg, heart rate of 64-1 8/min and respiratory rate of 1428.  O2 sat is 98% on room air.  Lab work is as documented above.  Patient has received vitamin K for elevated INR.  Patient has been  aggressively hydrated, pancultured and started on antibiotics.  The hospitalist team has been called to admit patient.  **Interim History  Patient had a repeat CT scan as above and IR was consulted for liver lesion biopsy given is the safest option and most efficacious way of determining source of her primary cancer given her multiple liver metastasis given that the GI had limited endoscopic options for diagnosis. Likely source of Cancer is Pancreatic Cancer vs. Recurrent Breast Cancer.  Oncology was consulted for further evaluation recommendations and she is still awaiting a biopsy.  Palliative care was also called for goals of care consultation  Overnight she had a temperature of 101.1  Assessment & Plan:   Active Problems:   Sepsis (Mark)  Sepis of Unclear etiology -Admit patient for further assessment and management. -Unclear etiology -She was hypotensive, tachycardic, tachypneic and had a leukocytosis and unclear source but she does have widespread disease; also admitted lactic acidosis on admission; overnight she was febrile with a T-max of 101.1 Continue with empiric antibiotics with IV vancomycin and IV cefepime for now and will check MRSA PCR and de-escalate and stop vancomycin if negative -Given 2 L of normal saline boluses and placed on maintenance IV fluid at 125 MLS per hour which I have decreased noted 100 mL/h and will further decrease down to 75 mL's per hour and change to sodium bicarb and 850 mg -WBC remains elevated but is improving as it went from 19.7 yesterday is now 15.3 -WBC could be elevated in the setting of dehydration and sepsis but went from  20.2 on admission and is now 15.3 -Continue to follow blood cultures -Urinalysis was unremarkable but did show some rare bacteria  -Blood cultures and urine culture done and blood culture showed no growth to date at 2 days and urine culture showed no growth -CT of the Chest/Abdomen/Pelvis showed "Moderate severity emphysematous  lung disease. Mild left upper lobe and bilateral lower lobe atelectasis.  Large stable soft tissue mass along the region of the pancreatic tail and splenic hilum, consistent with primary versus metastatic neoplastic disease. Numerous stable liver lesions consistent with liver metastasis. Stable soft tissue masses within the mid to upper left abdomen and region of the splenic flexure consistent with additional neoplastic lesions. Stable findings likely consistent with an evolving splenic infarct. No evidence of a retroperitoneal hematoma in the presence of lower abdominal pain and hypotension." -She was febrile yesterday so given Acetaminophen -Continue to monitor temperature curve and repeat CBC in a.m.  Elevated D-dimer -D-Dimer was 3.14 -Had a CTA of the chest PE study which showed no Filling Defects -Will obtain a LE Venous Duplex  Coagulopathy -Had an INR of 1.5 on Admission and given 5 mg po Vitamin K -Repeat INR this AM was 1.3  Hepatitis C with cirrhosis and SVR -She is status post Harvoni in 2017 -Followed by a hepatologist at [facility] next-previously was drinking 2 cans of beer daily but stopped  Widespread metastatic disease with liver lesions and anorexia, weight loss intermittent abdominal pain in the setting of pancreatic cancer versus recurrent breast cancer -Has a history of breast cancer status post radiation, left breast mastectomy as well as Arimidex and follows with Dr. Lindi Adie -Patient will need tissue diagnosis for her metastatic carcinoma -Have a large pancreatic mass with liver metastasis which is concerning for metastatic pancreatic cancer however unfortunately until a biopsy is obtained cannot rule out metastatic breast cancer -Consult IR for liver biopsy and this did not happen yesterday but will hopefully happen today -Also consulted medical oncology Dr. Nicholas Lose and appreciate his insight and recc's: Dr. Quintella Reichert believes that if she does have metastatic pancreatic  cancer she has an extremely poor prognosis and he would not offer her any systemic chemotherapy and recommend that hospice care would be indicated but if she does have metastatic breast cancer that he would consider systemic treatment options -Her AFP tumor marker was 5.7 and CA 19-9 was <3 -Likely contributing to the abdominal pain. -Plan for consultants including IR and medical oncology  Hypotension  -Patient has had poor p.o. intake -IV fluid as above -Monitor closely -Hold Amlodipine for now and continue monitor blood pressures per protocol -Blood pressure was on the lower side still at 107/46; presented with a blood pressure 72/40 -TSH was normal   Lactic Acidosis -The setting of sepsis of unknown etiology and dehydration -Improved as lactic acid level from 3.2 is now 1.9 improved  AKI, improved -In the setting of very poor p.o. intake and dehydration and hypotension -Patient's BUN/creatinine went from 34/1.34 and today it was 13/0.84 -Continue with IV fluid hydration but will decrease maintenance rate from 125 mL of normal saline per hour to 100 MLS per hour and then today will change it from 100 MLS per hour to 75 MLS per hour and changed to sodium bicarbonate 150 mEQ  -Avoid nephrotoxic medications, contrast dyes, hypotension and renally adjust medications -Repeat CMP P in the a.m.  Metabolic Acidosis -CO2 on admission was 19 and today was 17 again  -Given a total of 2 L fluid  boluses and started on maintenance IV fluids at 125 mL's per hour and will drop to 100 mL/h and now changed to 75 mL's per hour of sodium bicarbonate -Continue to monitor and trend and repeat CMP in a.m.  Normocytic Anemia  -Patient's hemoglobin/hematocrit went from 11.1/34.3 and then dropped to 8.6/26.9 and now further dropped to 7.4/23.4; question if this could be a dilutional drop -transfuse if hemoglobin falls less than 7; could be in the setting of chronic disease and malignancy -Check anemia panel  in the a.m. -Continue to monitor for symptoms of bleeding; currently no overt bleeding noted -Repeat CBC in a.m.  Thrombocytosis  -Lately count on admission was 563 and then dropped to 450 now has normalized at 394; likely in the setting of her widespread metastatic disease and sepsis picture -Continue to monitor for signs and symptoms of bleeding -Repeat CBC in a.m.  Poor p.o. intake/Underweight -Consult Dietitian for further evaluation and recommendations   DVT prophylaxis: SCDs given that she will be going for Liver Biopsy  Code Status: FULL CODE   Family Communication: No family present at bedside  Disposition Plan: Patient came from: Home                                                                                                                          Anticipated d/c place: To be determined Barriers to d/c OR conditions which need to be met to effect a safe d/c: Further work-up with liver biopsy evaluation with oncology and IR as well as palliative care goals of care discussion  Consultants:   Gastroenterology Dr. Tarri Glenn  Medical Oncology Dr. Lindi Adie  Interventional Radiology  Palliative Care Medicine    Procedures: Liver Biopsy Scheduled    Antimicrobials: Anti-infectives (From admission, onward)   Start     Dose/Rate Route Frequency Ordered Stop   11/16/19 2030  ceFEPIme (MAXIPIME) 2 g in sodium chloride 0.9 % 100 mL IVPB     2 g 200 mL/hr over 30 Minutes Intravenous Every 24 hours 11/16/19 2017     11/16/19 2030  vancomycin (VANCOREADY) IVPB 750 mg/150 mL     750 mg 150 mL/hr over 60 Minutes Intravenous Every 48 hours 11/16/19 2017     11/16/19 1500  piperacillin-tazobactam (ZOSYN) IVPB 3.375 g     3.375 g 100 mL/hr over 30 Minutes Intravenous  Once 11/16/19 1456 11/16/19 1637     Subjective: Seen and Examined at bedside was feeling the same as yesterday. Awaiting her liver biopsy. Denies any CP or SOB. States her fever was from having too many blankets  on. No other concerns or complaints at this time and is not particularly hungry.   Objective: Vitals:   11/18/19 0358 11/18/19 0704 11/18/19 0743 11/18/19 1100  BP: (!) 124/47  (!) 114/46 (!) 113/47  Pulse: 95  90 91  Resp: (!) _0 Temp: (!) 101.1 F (38.4 C) 99.3 F (37.4 C) 98.8 F (37.1 C) 98.2 F (  36.8 C)  TempSrc: Oral Oral Oral Oral  SpO2: 95% 93% 94% 93%  Weight:      Height:        Intake/Output Summary (Last 24 hours) at 11/18/2019 1358 Last data filed at 11/18/2019 0429 Gross per 24 hour  Intake 2358.29 ml  Output 1250 ml  Net 1108.29 ml   Filed Weights   11/16/19 1244 11/16/19 2240  Weight: 44.2 kg 43.8 kg   Examination: Physical Exam:  Constitutional: Thin elderly African-American female currently in no acute distress appears calm Eyes: Lids and conjunctivae normal, sclerae anicteric  ENMT: External Ears, Nose appear normal. Grossly normal hearing. Neck: Appears normal, supple, no cervical masses, normal ROM, no appreciable thyromegaly; no JVD Respiratory: Diminished to auscultation bilaterally, no wheezing, rales, rhonchi or crackles. Normal respiratory effort and patient is not tachypenic. No accessory muscle use.  Was wearing 2 L supplemental oxygen via nasal cannula today Cardiovascular: RRR, no murmurs / rubs / gallops. S1 and S2 auscultated. No extremity edema. 2+ pedal pulses. No carotid bruits.  Abdomen: Soft, mildly tender, non-distended. Bowel sounds positive x4.  GU: Deferred. Musculoskeletal: No clubbing / cyanosis of digits/nails. No joint deformity upper and lower extremities. Skin: No rashes, lesions, ulcers on limited skin evaluation. No induration; Warm and dry.  Neurologic: CN 2-12 grossly intact with no focal deficits. Romberg sign and cerebellar reflexes not assessed.  Psychiatric: Normal judgment and insight. Alert and oriented x 3. Normal mood and appropriate affect.   Data Reviewed: I have personally reviewed following labs and  imaging studies  CBC: Recent Labs  Lab 11/16/19 1033 11/17/19 0221 11/18/19 1020  WBC 20.2 Repeated and verified X2.* 19.7* 15.3*  NEUTROABS 17.0*  --  11.6*  HGB 11.1* 8.6* 7.4*  HCT 34.3* 26.9* 23.4*  MCV 88.9 90.6 89.7  PLT 563.0* 450* 446   Basic Metabolic Panel: Recent Labs  Lab 11/16/19 1033 11/16/19 2333 11/17/19 0221 11/18/19 1020  NA 134*  --  137 138  K 5.4*  --  4.8 3.6  CL 102  --  109 115*  CO2 19  --  17* 17*  GLUCOSE 136*  --  95 94  BUN 34*  --  23 13  CREATININE 1.34*  --  1.04* 0.84  CALCIUM 10.3  --  8.7* 8.2*  MG  --  2.0  --  1.7  PHOS  --  2.8  --  2.6   GFR: Estimated Creatinine Clearance: 33.9 mL/min (by C-G formula based on SCr of 0.84 mg/dL). Liver Function Tests: Recent Labs  Lab 11/16/19 1033 11/17/19 0221 11/18/19 1020  AST _0 ALT _1 ALKPHOS 237* 158* 131*  BILITOT 0.6 0.7 0.4  PROT 8.6* 6.4* 5.5*  ALBUMIN 3.7 2.4* 1.9*   No results for input(s): LIPASE, AMYLASE in the last 168 hours. No results for input(s): AMMONIA in the last 168 hours. Coagulation Profile: Recent Labs  Lab 11/16/19 1033 11/18/19 1222  INR 1.5* 1.3*   Cardiac Enzymes: No results for input(s): CKTOTAL, CKMB, CKMBINDEX, TROPONINI in the last 168 hours. BNP (last 3 results) No results for input(s): PROBNP in the last 8760 hours. HbA1C: No results for input(s): HGBA1C in the last 72 hours. CBG: No results for input(s): GLUCAP in the last 168 hours. Lipid Profile: No results for input(s): CHOL, HDL, LDLCALC, TRIG, CHOLHDL, LDLDIRECT in the last 72 hours. Thyroid Function Tests: Recent Labs    11/16/19 2333  TSH 3.253   Anemia Panel:  No results for input(s): VITAMINB12, FOLATE, FERRITIN, TIBC, IRON, RETICCTPCT in the last 72 hours. Sepsis Labs: Recent Labs  Lab 11/16/19 1400 11/16/19 1634 11/16/19 2101  LATICACIDVEN 3.2* 3.6* 1.9    Recent Results (from the past 240 hour(s))  SARS CORONAVIRUS 2 (TAT 6-24 HRS) Nasopharyngeal  Nasopharyngeal Swab     Status: None   Collection Time: 11/16/19  1:32 PM   Specimen: Nasopharyngeal Swab  Result Value Ref Range Status   SARS Coronavirus 2 NEGATIVE NEGATIVE Final    Comment: (NOTE) SARS-CoV-2 target nucleic acids are NOT DETECTED. The SARS-CoV-2 RNA is generally detectable in upper and lower respiratory specimens during the acute phase of infection. Negative results do not preclude SARS-CoV-2 infection, do not rule out co-infections with other pathogens, and should not be used as the sole basis for treatment or other patient management decisions. Negative results must be combined with clinical observations, patient history, and epidemiological information. The expected result is Negative. Fact Sheet for Patients: SugarRoll.be Fact Sheet for Healthcare Providers: https://www.woods-mathews.com/ This test is not yet approved or cleared by the [place] FDA and  has been authorized for detection and/or diagnosis of SARS-CoV-2 by FDA under an Emergency Use Authorization (EUA). This EUA will remain  in effect (meaning this test can be used) for the duration of the COVID-19 declaration under Section 56 4(b)(1) of the Act, 21 U.S.C. section 360bbb-3(b)(1), unless the authorization is terminated or revoked sooner. Performed at [facility], [place] [address] [place], [place] [address]   Culture, blood (routine x 2)     Status: None (Preliminary result)   Collection Time: 11/16/19  2:11 PM   Specimen: BLOOD  Result Value Ref Range Status   Specimen Description BLOOD RIGHT ANTECUBITAL  Final   Special Requests   Final    BOTTLES DRAWN AEROBIC AND ANAEROBIC Blood Culture adequate volume   Culture   Final    NO GROWTH 2 DAYS Performed at [facility], [place] [address] [place], [place] [address]    Report Status PENDING  Incomplete  Culture, blood (routine x 2)     Status: None (Preliminary result)   Collection Time:  11/16/19  2:26 PM   Specimen: BLOOD  Result Value Ref Range Status   Specimen Description BLOOD RIGHT ANTECUBITAL  Final   Special Requests   Final    BOTTLES DRAWN AEROBIC AND ANAEROBIC Blood Culture adequate volume   Culture   Final    NO GROWTH 2 DAYS Performed at [facility], [place] [address] [place], [place] [address]    Report Status PENDING  Incomplete  Urine culture     Status: None   Collection Time: 11/17/19  5:16 AM   Specimen: Urine, Random  Result Value Ref Range Status   Specimen Description URINE, RANDOM  Final   Special Requests NONE  Final   Culture   Final    NO GROWTH Performed at [facility], [place] [address] [place], [place] [address]    Report Status 11/17/2019 FINAL  Final    Radiology Studies: CT Angio Chest PE W/Cm &/Or Wo Cm  Result Date: 11/16/2019 CLINICAL DATA:  Lower abdominal pain with hypotension. EXAM: CT CHEST, ABDOMEN, AND PELVIS WITH CONTRAST TECHNIQUE: Multidetector CT imaging of the chest, abdomen and pelvis was performed following the standard protocol during bolus administration of intravenous contrast. CONTRAST:  2m OMNIPAQUE IOHEXOL 350 MG/ML SOLN COMPARISON:  Abdomen and pelvis CT, dated October 27, 2019, is  available. FINDINGS: CT CHEST FINDINGS Cardiovascular: There is marked severity calcification of the thoracic aorta. The pulmonary arteries are normal in appearance without evidence of intraluminal filling defects. Normal heart size. A very small (4 mm) anterior pericardial effusion is seen. Marked severity coronary artery calcification is seen. Mediastinum/Nodes: No enlarged mediastinal, hilar, or axillary lymph nodes. Thyroid gland, trachea, and esophagus demonstrate no significant findings. Lungs/Pleura: There is moderate severity emphysematous lung disease. Mild atelectasis is seen within the posterior aspect of the left upper lobe and bilateral lower lobes. There is no evidence of a pleural effusion or pneumothorax.  Musculoskeletal: Multilevel degenerative changes seen throughout the thoracic spine. CT ABDOMEN PELVIS FINDINGS Hepatobiliary: Numerous heterogeneous low-attenuation liver lesions of various sizes are seen scattered throughout the right and left lobe. These areas are stable in size and appearance when compared to the prior study. No gallstones, gallbladder wall thickening, or biliary dilatation. Pancreas: A predominant stable 6.1 cm x 5.6 cm heterogeneous low-attenuation soft tissue mass is seen along the region of the pancreatic tail and splenic hilum. A stable, adjacent 2.4 cm x 1.1 cm soft tissue mass is noted near the splenic flexure (axial CT image 20, CT series number 12). An additional stable 3.0 cm x 2.6 cm soft tissue mass is seen within the anterolateral aspect of the mid to upper left abdomen (axial CT image 28, CT series number 12). Spleen: A large, stable area of heterogeneous attenuation is seen along the posterior aspect of the splenic parenchyma. Adrenals/Urinary Tract: The left and right adrenal glands are poorly visualized. Kidneys are normal, without renal calculi, focal lesion, or hydronephrosis. Bladder is unremarkable. Stomach/Bowel: Stomach is within normal limits. The appendix is not clearly identified. No evidence of bowel wall thickening, distention, or inflammatory changes. Vascular/Lymphatic: Marked severity aortic atherosclerosis. No enlarged abdominal or pelvic lymph nodes. Reproductive: Status post hysterectomy. No adnexal masses. Other: No abdominal wall hernia or abnormality. No abdominopelvic ascites. Musculoskeletal: Multilevel degenerative changes seen throughout the lumbar spine. IMPRESSION: 1. Moderate severity emphysematous lung disease. 2. Mild left upper lobe and bilateral lower lobe atelectasis. 3. Large stable soft tissue mass along the region of the pancreatic tail and splenic hilum, consistent with primary versus metastatic neoplastic disease. 4. Numerous stable liver  lesions consistent with liver metastasis. 5. Stable soft tissue masses within the mid to upper left abdomen and region of the splenic flexure consistent with additional neoplastic lesions. 6. Stable findings likely consistent with an evolving splenic infarct. 7. No evidence of a retroperitoneal hematoma in the presence of lower abdominal pain and hypotension. Electronically Signed   By: Virgina Norfolk M.D.   On: 11/16/2019 18:24   CT Abdomen Pelvis W Contrast  Result Date: 11/16/2019 CLINICAL DATA:  Lower abdominal pain with hypotension. EXAM: CT CHEST, ABDOMEN, AND PELVIS WITH CONTRAST TECHNIQUE: Multidetector CT imaging of the chest, abdomen and pelvis was performed following the standard protocol during bolus administration of intravenous contrast. CONTRAST:  29m OMNIPAQUE IOHEXOL 350 MG/ML SOLN COMPARISON:  Abdomen and pelvis CT, dated October 27, 2019, is available. FINDINGS: CT CHEST FINDINGS Cardiovascular: There is marked severity calcification of the thoracic aorta. The pulmonary arteries are normal in appearance without evidence of intraluminal filling defects. Normal heart size. A very small (4 mm) anterior pericardial effusion is seen. Marked severity coronary artery calcification is seen. Mediastinum/Nodes: No enlarged mediastinal, hilar, or axillary lymph nodes. Thyroid gland, trachea, and esophagus demonstrate no significant findings. Lungs/Pleura: There is moderate severity emphysematous lung disease. Mild atelectasis is seen within  the posterior aspect of the left upper lobe and bilateral lower lobes. There is no evidence of a pleural effusion or pneumothorax. Musculoskeletal: Multilevel degenerative changes seen throughout the thoracic spine. CT ABDOMEN PELVIS FINDINGS Hepatobiliary: Numerous heterogeneous low-attenuation liver lesions of various sizes are seen scattered throughout the right and left lobe. These areas are stable in size and appearance when compared to the prior study. No  gallstones, gallbladder wall thickening, or biliary dilatation. Pancreas: A predominant stable 6.1 cm x 5.6 cm heterogeneous low-attenuation soft tissue mass is seen along the region of the pancreatic tail and splenic hilum. A stable, adjacent 2.4 cm x 1.1 cm soft tissue mass is noted near the splenic flexure (axial CT image 20, CT series number 12). An additional stable 3.0 cm x 2.6 cm soft tissue mass is seen within the anterolateral aspect of the mid to upper left abdomen (axial CT image 28, CT series number 12). Spleen: A large, stable area of heterogeneous attenuation is seen along the posterior aspect of the splenic parenchyma. Adrenals/Urinary Tract: The left and right adrenal glands are poorly visualized. Kidneys are normal, without renal calculi, focal lesion, or hydronephrosis. Bladder is unremarkable. Stomach/Bowel: Stomach is within normal limits. The appendix is not clearly identified. No evidence of bowel wall thickening, distention, or inflammatory changes. Vascular/Lymphatic: Marked severity aortic atherosclerosis. No enlarged abdominal or pelvic lymph nodes. Reproductive: Status post hysterectomy. No adnexal masses. Other: No abdominal wall hernia or abnormality. No abdominopelvic ascites. Musculoskeletal: Multilevel degenerative changes seen throughout the lumbar spine. IMPRESSION: 1. Moderate severity emphysematous lung disease. 2. Mild left upper lobe and bilateral lower lobe atelectasis. 3. Large stable soft tissue mass along the region of the pancreatic tail and splenic hilum, consistent with primary versus metastatic neoplastic disease. 4. Numerous stable liver lesions consistent with liver metastasis. 5. Stable soft tissue masses within the mid to upper left abdomen and region of the splenic flexure consistent with additional neoplastic lesions. 6. Stable findings likely consistent with an evolving splenic infarct. 7. No evidence of a retroperitoneal hematoma in the presence of lower  abdominal pain and hypotension. Electronically Signed   By: Virgina Norfolk M.D.   On: 11/16/2019 18:24   Scheduled Meds: . pneumococcal 23 valent vaccine  0.5 mL Intramuscular Tomorrow-1000   Continuous Infusions: . sodium chloride 100 mL/hr at 11/18/19 0352  . ceFEPime (MAXIPIME) IV 2 g (11/17/19 2027)  . vancomycin Stopped (11/16/19 2230)    LOS: 2 days   Kerney Elbe, DO [facility] PAGER is on [place]  If 7PM-7AM, please contact night-coverage [email or web address]

## 2019-11-18 NOTE — Progress Notes (Signed)
Patient sleeping o2 sats 88% Resp. Even and unlab. o2 at 2 l/m n.c. applied

## 2019-11-19 ENCOUNTER — Telehealth: Payer: Self-pay | Admitting: Nurse Practitioner

## 2019-11-19 ENCOUNTER — Inpatient Hospital Stay (HOSPITAL_COMMUNITY): Payer: Medicare Other

## 2019-11-19 DIAGNOSIS — R791 Abnormal coagulation profile: Secondary | ICD-10-CM

## 2019-11-19 DIAGNOSIS — D649 Anemia, unspecified: Secondary | ICD-10-CM

## 2019-11-19 LAB — HEPATITIS C RNA QUANTITATIVE
HCV Quantitative Log: <1.18 Log IU/mL
HCV RNA, PCR, QN: <15 IU/mL

## 2019-11-19 LAB — CBC WITH DIFFERENTIAL/PLATELET
Abs Immature Granulocytes: 0.28 10*3/uL — ABNORMAL HIGH (ref 0.00–0.07)
Basophils Absolute: 0.1 10*3/uL (ref 0.0–0.1)
Basophils Relative: 0 %
Eosinophils Absolute: 0.1 10*3/uL (ref 0.0–0.5)
Eosinophils Relative: 0 %
HCT: 22.5 % — ABNORMAL LOW (ref 36.0–46.0)
Hemoglobin: 7.2 g/dL — ABNORMAL LOW (ref 12.0–15.0)
Immature Granulocytes: 1 %
Lymphocytes Relative: 5 %
Lymphs Abs: 1.3 10*3/uL (ref 0.7–4.0)
MCH: 28.6 pg (ref 26.0–34.0)
MCHC: 32 g/dL (ref 30.0–36.0)
MCV: 89.3 fL (ref 80.0–100.0)
Monocytes Absolute: 1.6 10*3/uL — ABNORMAL HIGH (ref 0.1–1.0)
Monocytes Relative: 7 %
Neutro Abs: 21.4 10*3/uL — ABNORMAL HIGH (ref 1.7–7.7)
Neutrophils Relative %: 87 %
Platelets: 391 10*3/uL (ref 150–400)
RBC: 2.52 MIL/uL — ABNORMAL LOW (ref 3.87–5.11)
RDW: 14.1 % (ref 11.5–15.5)
WBC: 24.6 10*3/uL — ABNORMAL HIGH (ref 4.0–10.5)
nRBC: 0 % (ref 0.0–0.2)

## 2019-11-19 LAB — COMPREHENSIVE METABOLIC PANEL
ALT: 11 U/L (ref 0–44)
AST: 24 U/L (ref 15–41)
Albumin: 2 g/dL — ABNORMAL LOW (ref 3.5–5.0)
Alkaline Phosphatase: 134 U/L — ABNORMAL HIGH (ref 38–126)
Anion gap: 13 (ref 5–15)
BUN: 12 mg/dL (ref 8–23)
CO2: 14 mmol/L — ABNORMAL LOW (ref 22–32)
Calcium: 8.1 mg/dL — ABNORMAL LOW (ref 8.9–10.3)
Chloride: 111 mmol/L (ref 98–111)
Creatinine, Ser: 0.87 mg/dL (ref 0.44–1.00)
GFR calc Af Amer: >60 mL/min (ref >60)
GFR calc non Af Amer: >60 mL/min (ref >60)
Glucose, Bld: 62 mg/dL — ABNORMAL LOW (ref 70–99)
Potassium: 4 mmol/L (ref 3.5–5.1)
Sodium: 138 mmol/L (ref 135–145)
Total Bilirubin: 0.9 mg/dL (ref 0.3–1.2)
Total Protein: 5.6 g/dL — ABNORMAL LOW (ref 6.5–8.1)

## 2019-11-19 LAB — HEMOGLOBIN AND HEMATOCRIT, BLOOD
HCT: 35.6 % — ABNORMAL LOW (ref 36.0–46.0)
Hemoglobin: 12.1 g/dL (ref 12.0–15.0)

## 2019-11-19 LAB — PHOSPHORUS: Phosphorus: 2.4 mg/dL — ABNORMAL LOW (ref 2.5–4.6)

## 2019-11-19 LAB — CANCER ANTIGEN 19-9: CA 19-9: <3 U/mL (ref <34)

## 2019-11-19 LAB — MAGNESIUM: Magnesium: 1.5 mg/dL — ABNORMAL LOW (ref 1.7–2.4)

## 2019-11-19 LAB — AFP TUMOR MARKER: AFP-Tumor Marker: 5.7 ng/mL

## 2019-11-19 LAB — EXTRA LAV TOP TUBE

## 2019-11-19 LAB — GLUCOSE, CAPILLARY: Glucose-Capillary: 142 mg/dL — ABNORMAL HIGH (ref 70–99)

## 2019-11-19 LAB — PROCALCITONIN: Procalcitonin: 3.04 ng/mL

## 2019-11-19 LAB — PREPARE RBC (CROSSMATCH)

## 2019-11-19 MED ORDER — MAGNESIUM SULFATE 4 GM/100ML IV SOLN
4.0000 g | Freq: Once | INTRAVENOUS | Status: AC
  Administered 2019-11-19: 10:00:00 4 g via INTRAVENOUS
  Filled 2019-11-19: qty 100

## 2019-11-19 MED ORDER — SODIUM BICARBONATE-DEXTROSE 150-5 MEQ/L-% IV SOLN
150.0000 meq | INTRAVENOUS | Status: DC
  Administered 2019-11-19 – 2019-11-20 (×2): 150 meq via INTRAVENOUS
  Filled 2019-11-19 (×2): qty 1000

## 2019-11-19 MED ORDER — OXYCODONE HCL 5 MG PO TABS
5.0000 mg | ORAL_TABLET | ORAL | Status: DC | PRN
  Administered 2019-11-19 – 2019-11-26 (×8): 5 mg via ORAL
  Filled 2019-11-19 (×8): qty 1

## 2019-11-19 MED ORDER — SODIUM CHLORIDE 0.9% IV SOLUTION: Freq: Once | INTRAVENOUS | Status: AC

## 2019-11-19 MED ORDER — MAGNESIUM SULFATE 50 % IJ SOLN: 3.0000 g | Freq: Once | INTRAVENOUS | Status: DC

## 2019-11-19 MED ORDER — POTASSIUM PHOSPHATES 15 MMOLE/5ML IV SOLN
10.0000 mmol | Freq: Once | INTRAVENOUS | Status: AC
  Administered 2019-11-19: 10 mmol via INTRAVENOUS
  Filled 2019-11-19: qty 3.33

## 2019-11-19 NOTE — Progress Notes (Signed)
Lower extremity venous has been completed.   Preliminary results in CV Proc.   [name] 11/19/2019 8:12 AM

## 2019-11-19 NOTE — Telephone Encounter (Signed)
I returned her son, Daryl's phone call. He wanted status of mother's biopsy and to verify if she had cancer what was the plan. I advised the patient to contact her oncologist Dr. Lindi Adie. I advised the patient I am in the outpatient office and not in the hospital.

## 2019-11-19 NOTE — Progress Notes (Signed)
Pharmacy Antibiotic Note  [name] is a 86 y.o. female admitted on 11/16/2019 with sepsis.  Pharmacy has been consulted for Cefepime and Vancomycin dosing.  Febrile to 101.5 overnight, wbc up to 25 this am. Renal function stable with scr of 0.87 this am. No growth in cultures  Liver bx cancelled yesterday   Height: 5\' 2"  (157.5 cm) Weight: 96 lb 8 oz (43.8 kg) IBW/kg (Calculated) : 50.1  Temp (24hrs), Avg:99.4 F (37.4 C), Min:98.2 F (36.8 C), Max:101.5 F (38.6 C)  Recent Labs  Lab 11/16/19 1033 11/16/19 1400 11/16/19 1634 11/16/19 2101 11/17/19 0221 11/18/19 1020 11/18/19 1838 11/18/19 1915 11/18/19 2134 11/19/19 0046  WBC 20.2 Repeated and verified X2.*  --   --   --  19.7* 15.3*  --  25.6*  --  24.6*  CREATININE 1.34*  --   --   --  1.04* 0.84  --  0.84  --  0.87  LATICACIDVEN  --  3.2* 3.6* 1.9  --   --  1.7  --  1.5  --     Estimated Creatinine Clearance: 32.7 mL/min (by C-G formula based on SCr of 0.87 mg/dL).    No Known Allergies  Cefepime 1/26 > Vancomycin 1/26 > Piptazo 1/26   1/26 Bcx ngtd 1/26 UA neg 1/27 Ucx ng 1/26 Covid/flu neg  1/28 bld: ngtd  Plan:  - Cefepime 2g IV q24hr  - Vancomycin 750mg  IV q48hr -next dose due 1/30 - Est Calc AUC 531 Will consider vancomycin level prior to redosing vs true steady state on 2/1  Thank you for allowing pharmacy to be a part of this patient's care.  Erin Hearing PharmD., BCPS Clinical Pharmacist 11/19/2019 10:33 AM

## 2019-11-19 NOTE — Telephone Encounter (Signed)
Please call this patient's family member and give information

## 2019-11-19 NOTE — Progress Notes (Signed)
PROGRESS NOTE    [name]  QQV:[id number] DOB: June 17, 1932 DOA: 11/16/2019 PCP: Lucianne Lei, MD   Brief Narrative:  HPI per Dr. Dana Allan on 11/15/2018 Patient is an 86 year old female with past medical history significant for breast cancer, hypertension, hyperlipidemia, chronic abdominal pain, rheumatoid arthritis and chronic kidney disease stage IIIA/B.  Patient was seen by the GI provider earlier today for abdominal pain.  Apparently, patient has had abdominal pain for about 2 weeks.  Patient reported constant lower abdominal pain.  There is associated poor p.o. intake and poor appetite.  Patient reported significant weight loss.  On presentation to the GI provider's office, patient was found to be hypotensive, with systolic blood pressure in the 70s.  Patient was advised to come to the hospital for further assessment and management.  Lab work done at the hospital revealed CBC of 20.2, hemoglobin of 11.1, hematocrit of 34.3 and platelet count of 563.  Chemistry revealed sodium of 134, potassium of 5.4, chloride 102, CO2 of 19, BUN of 34 with serum creatinine of 1.3 from blood sugar of 136.  INR of 1.5 and lactic acid of 3.6 were reported as well.  D-dimer was elevated.  CTA of the chest and abdomen revealed lung emphysema, bilateral lower lung lobe atelectasis, large stable soft tissue mass around the tail of pancreas and numerous liver metastasis.  Evolving splenic infarct was also queried.  No headache, no neck, no chest pain, no shortness of breath, no no nausea vomiting or change in bowel habit.  Hospitalist team will admit patient for further assessment and management.  ED Course: On presentation to the emergency room, vitals revealed temperature of 97.3, blood pressure of 72/40 - 135/97 mmHg, heart rate of 64-1 8/min and respiratory rate of 1428.  O2 sat is 98% on room air.  Lab work is as documented above.  Patient has received vitamin K for elevated INR.  Patient has been  aggressively hydrated, pancultured and started on antibiotics.  The hospitalist team has been called to admit patient.  **Interim History  Patient had a repeat CT scan as above and IR was consulted for liver lesion biopsy given is the safest option and most efficacious way of determining source of her primary cancer given her multiple liver metastasis given that the GI had limited endoscopic options for diagnosis. Likely source of Cancer is Pancreatic Cancer vs. Recurrent Breast Cancer.  Oncology was consulted for further evaluation recommendations and she is still awaiting a biopsy.  Palliative care was also called for goals of care consultation  Overnight she had a temperature of 101.1 and yesterday she spiked another temperature.  Blood cultures will obtained again and she is given acetaminophen.  WBC has elevated and procalcitonin is also elevated.  Biopsy has been canceled for now until she is more medically stable and likely will be done Monday.  If she is not improving will need ID assistance to locate the source of her likely infection.  Her hemoglobin/hematocrit is on the lower side.  Been screened and transfused 2 units PRBCs  Assessment & Plan:   Active Problems:   Sepsis ([place])   Protein-calorie malnutrition, severe   Palliative care by specialist   Goals of care, counseling/discussion   Splenic mass   Liver metastases ([place])   Pancreatic mass  Sepis of Unclear etiology -Admit patient for further assessment and management. -Unclear etiology -She was hypotensive, tachycardic, tachypneic and had a leukocytosis and unclear source but she does have widespread disease; also admitted  lactic acidosis on admission; overnight she was febrile with a T-max of 101.1 yesterday she was febrile again;  -Procalcitonin was elevated and went from 2.90 is now 3.04 Continue with empiric antibiotics with IV vancomycin and IV cefepime for now and will check MRSA PCR and de-escalate and stop vancomycin if  negative -Given 2 L of normal saline boluses and placed on maintenance IV fluid at 125 MLS per hour which I have decreased noted 100 mL/h and will further decrease down to 75 mL's per hour and change to sodium bicarbonate with 150 mEQ at 100 mL/hr now  -WBC remains elevated; initially was improving but it went from 19.7 -> 15.3 -> 25.6 -> 24.6  -Continue to follow blood cultures and re-cultured yesterday  -Urinalysis was unremarkable but did show some rare bacteria  -Blood cultures and urine culture done and blood culture showed no growth to date at 3 days and urine culture showed no growth; Repeat Blood Cx pending  -CT of the Chest/Abdomen/Pelvis showed "Moderate severity emphysematous lung disease. Mild left upper lobe and bilateral lower lobe atelectasis.  Large stable soft tissue mass along the region of the pancreatic tail and splenic hilum, consistent with primary versus metastatic neoplastic disease. Numerous stable liver lesions consistent with liver metastasis. Stable soft tissue masses within the mid to upper left abdomen and region of the splenic flexure consistent with additional neoplastic lesions. Stable findings likely consistent with an evolving splenic infarct. No evidence of a retroperitoneal hematoma in the presence of lower abdominal pain and hypotension."  -Patient complaining still complaining about abdominal pain so we will continue acetaminophen but will also add oxycodone IR -She was febrile again yesterday so given Acetaminophen -Continue to monitor temperature curve and repeat CBC in a.m. -If not improving will ask for infectious disease assistance  Elevated D-dimer -D-Dimer was 3.14 -Had a CTA of the chest PE study which showed no Filling Defects -Will obtain a LE Venous Duplex and this was Negative for DVT bilaterally   Coagulopathy -Had an INR of 1.5 on Admission and given 5 mg po Vitamin K -Repeat INR this AM was 1.3  Hepatitis C with cirrhosis and SVR -She is  status post Harvoni in 2017 -Followed by a hepatologist at [facility] next-previously was drinking 2 cans of beer daily but stopped  Widespread metastatic disease with liver lesions and anorexia, weight loss intermittent abdominal pain in the setting of pancreatic cancer versus recurrent breast cancer -Has a history of breast cancer status post radiation, left breast mastectomy as well as Arimidex and follows with Dr. Lindi Adie -Patient will need tissue diagnosis for her metastatic carcinoma -Have a large pancreatic mass with liver metastasis which is concerning for metastatic pancreatic cancer however unfortunately until a biopsy is obtained cannot rule out metastatic breast cancer -Consult IR for liver biopsy and this did not happen yesterday but will hopefully happen today -Also consulted medical oncology Dr. Nicholas Lose and appreciate his insight and recc's: Dr. Quintella Reichert believes that if she does have metastatic pancreatic cancer she has an extremely poor prognosis and he would not offer her any systemic chemotherapy and recommend that hospice care would be indicated but if she does have metastatic breast cancer that he would consider systemic treatment options -Her AFP tumor marker was 5.7 and CA 19-9 was <3 -Likely contributing to the abdominal pain.  -Plan for consultants including IR and medical oncology and likely needs a biopsy at some point  Hypotension  -Patient has had poor p.o. intake -IV fluid  as above and now BP is improving  -Monitor closely -Hold Amlodipine for now and continue monitor blood pressures per protocol -Blood pressure was on the lower side still at 124/53; presented with a blood pressure 72/40 -TSH was normal   Lactic Acidosis -The setting of sepsis of unknown etiology and dehydration -Improved as lactic acid level from 3.2 is now 1.9 improved  AKI, improved -In the setting of very poor p.o. intake and dehydration and hypotension -Patient's BUN/creatinine went from  34/1.34 and today it was 12/0.87 -Continue with IV fluid hydration but will decrease maintenance rate from 125 mL of normal saline per hour to 100 MLS per hour and then today will change it from 100 MLS per hour to 75 MLS per hour and changed to sodium bicarbonate 150 mEQ  -Avoid nephrotoxic medications, contrast dyes, hypotension and renally adjust medications -Repeat CMP P in the a.m.  Metabolic Acidosis -CO2 on admission was 19 and today it has dropped to 14 -Given a total of 2 L fluid boluses and started on maintenance IV fluids at 125 mL's per hour and will drop to 100 mL/h and now changed to 75 mL's per hour of sodium bicarbonate; IV Sodium Bicarbonate Rate increased to 100 mL on 11/19/2019  -Continue to monitor and trend and repeat CMP in a.m.  Normocytic Anemia  -Patient's hemoglobin/hematocrit went from 11.1/34.3 and then dropped to 8.6/26.9 and now further dropped to 7.4/23.4 -> 7.1/22.8 -> 7.2/22.5; question if this could be a dilutional drop vs Bleeding -transfuse if hemoglobin falls less than 7; could be in the setting of chronic disease and malignancy -Check anemia panel but not done -Will Type and Screen and transfuse 2 units of pRBC's -Continue to monitor for symptoms of bleeding; currently no overt bleeding noted -Repeat CBC in a.m.  Hypomagnesemia -Patient's magnesium level 1.5 -Replete with IV mag sulfate 3 g- -continue monitor and replete as necessary -Repeat CMP in a.m.  Hypophosphatemia -Patient's phosphorus level was 2.4 -Replete with IV K-Phos 10 mmol -Continue to monitor and replete as necessary -Repeat phosphorus level in a.m.  Thrombocytosis  -Lately count on admission was 563 and then dropped to 450 now has normalized at 394 -> 387 -> 391; likely in the setting of her widespread metastatic disease and sepsis picture -Continue to monitor for signs and symptoms of bleeding -Repeat CBC in a.m.  Poor p.o. intake/Underweight/severe malnutrition in the context  of chronic illness -Consult Dietitian for further evaluation and recommendations  -Now that she is on a diet appreciate further recommendations  DVT prophylaxis: SCDs given that she will be going for Liver Biopsy  Code Status: FULL CODE   Family Communication: No family present at bedside  Disposition Plan: Patient came from: Home                                                                                                                          Anticipated d/c place: To be determined Barriers to d/c OR conditions  which need to be met to effect a safe d/c: Further work-up with liver biopsy evaluation with oncology and IR as well as palliative care goals of care discussion as well as medical stability and treatment of her sepsis  Consultants:   Gastroenterology Dr. Tarri Glenn  Medical Oncology Dr. Lindi Adie  Interventional Radiology  Palliative Care Medicine    Procedures: Liver Biopsy to be done LE Venous Duplex   Antimicrobials: Anti-infectives (From admission, onward)   Start     Dose/Rate Route Frequency Ordered Stop   11/16/19 2030  ceFEPIme (MAXIPIME) 2 g in sodium chloride 0.9 % 100 mL IVPB     2 g 200 mL/hr over 30 Minutes Intravenous Every 24 hours 11/16/19 2017     11/16/19 2030  vancomycin (VANCOREADY) IVPB 750 mg/150 mL     750 mg 150 mL/hr over 60 Minutes Intravenous Every 48 hours 11/16/19 2017     11/16/19 1500  piperacillin-tazobactam (ZOSYN) IVPB 3.375 g     3.375 g 100 mL/hr over 30 Minutes Intravenous  Once 11/16/19 1456 11/16/19 1637     Subjective: Seen and Examined at bedside was was having abdominal pain today.  No chest pain, lightheadedness or dizziness.  No nausea or vomiting.  Did not feel as well today.  No other concerns or complaints at this time.  Objective: Vitals:   11/19/19 0022 11/19/19 0810 11/19/19 1130 11/19/19 1206  BP: (!) 101/42 (!) 117/44 (!) 114/48 (!) 124/53  Pulse: 96 95 95 97  Resp: (!) 22 (!) 21 (!) 28 (!) 21  Temp: 99.1 F  (37.3 C) 98.7 F (37.1 C) 99 F (37.2 C) 98.9 F (37.2 C)  TempSrc: Oral Oral Oral Oral  SpO2: 100% 100% 98% 98%  Weight:      Height:        Intake/Output Summary (Last 24 hours) at 11/19/2019 1319 Last data filed at 11/19/2019 1145 Gross per 24 hour  Intake 3151.24 ml  Output 500 ml  Net 2651.24 ml   Filed Weights   11/16/19 1244 11/16/19 2240  Weight: 44.2 kg 43.8 kg   Examination: Physical Exam:  Constitutional: Thin elderly African-American female currently in no acute distress appears calm but does complain about some abdominal pain and does appear a little uncomfortable Eyes: Lids and conjunctivae normal, sclerae anicteric  ENMT: External Ears, Nose appear normal. Grossly normal hearing.  Neck: Appears normal, supple, no cervical masses, normal ROM, no appreciable thyromegaly; no JVD Respiratory: Diminished to auscultation bilaterally, no wheezing, rales, rhonchi or crackles. Normal respiratory effort and patient is not tachypenic. No accessory muscle use.  Not wearing any supplemental oxygen via nasal cannula today Cardiovascular: RRR, no murmurs / rubs / gallops. S1 and S2 auscultated. No extremity edema.  Abdomen: Soft, tender to palpate, non-distended. Bowel sounds positive.  GU: Deferred. Musculoskeletal: No clubbing / cyanosis of digits/nails. No joint deformity upper and lower extremities. Skin: No rashes, lesions, ulcers on limited skin evaluation. No induration; Warm and dry.  Neurologic: CN 2-12 grossly intact with no focal deficits. Romberg sign and cerebellar reflexes not assessed.  Psychiatric: Normal judgment and insight. Alert and oriented x 3. Normal mood and appropriate affect.   Data Reviewed: I have personally reviewed following labs and imaging studies  CBC: Recent Labs  Lab 11/16/19 1033 11/17/19 0221 11/18/19 1020 11/18/19 1915 11/19/19 0046  WBC 20.2 Repeated and verified X2.* 19.7* 15.3* 25.6* 24.6*  NEUTROABS 17.0*  --  11.6* 23.3* 21.4*    HGB 11.1* 8.6* 7.4* 7.1*  7.2*  HCT 34.3* 26.9* 23.4* 22.8* 22.5*  MCV 88.9 90.6 89.7 91.6 89.3  PLT 563.0* 450* 394 387 938   Basic Metabolic Panel: Recent Labs  Lab 11/16/19 1033 11/16/19 2333 11/17/19 0221 11/18/19 1020 11/18/19 1915 11/19/19 0046  NA 134*  --  137 138 138 138  K 5.4*  --  4.8 3.6 3.8 4.0  CL 102  --  109 115* 112* 111  CO2 19  --  17* 17* 14* 14*  GLUCOSE 136*  --  95 94 96 62*  BUN 34*  --  '23 13 12 12  ' CREATININE 1.34*  --  1.04* 0.84 0.84 0.87  CALCIUM 10.3  --  8.7* 8.2* 8.1* 8.1*  MG  --  2.0  --  1.7 1.4* 1.5*  PHOS  --  2.8  --  2.6 2.4* 2.4*   GFR: Estimated Creatinine Clearance: 32.7 mL/min (by C-G formula based on SCr of 0.87 mg/dL). Liver Function Tests: Recent Labs  Lab 11/16/19 1033 11/17/19 0221 11/18/19 1020 11/18/19 1915 11/19/19 0046  AST '22 24 20 21 24  ' ALT '15 16 12 13 11  ' ALKPHOS 237* 158* 131* 133* 134*  BILITOT 0.6 0.7 0.4 0.9 0.9  PROT 8.6* 6.4* 5.5* 5.6* 5.6*  ALBUMIN 3.7 2.4* 1.9* 2.0* 2.0*   No results for input(s): LIPASE, AMYLASE in the last 168 hours. No results for input(s): AMMONIA in the last 168 hours. Coagulation Profile: Recent Labs  Lab 11/16/19 1033 11/18/19 1222  INR 1.5* 1.3*   Cardiac Enzymes: No results for input(s): CKTOTAL, CKMB, CKMBINDEX, TROPONINI in the last 168 hours. BNP (last 3 results) No results for input(s): PROBNP in the last 8760 hours. HbA1C: No results for input(s): HGBA1C in the last 72 hours. CBG: Recent Labs  Lab 11/19/19 0241  GLUCAP 142*   Lipid Profile: No results for input(s): CHOL, HDL, LDLCALC, TRIG, CHOLHDL, LDLDIRECT in the last 72 hours. Thyroid Function Tests: Recent Labs    11/16/19 2333  TSH 3.253   Anemia Panel: No results for input(s): VITAMINB12, FOLATE, FERRITIN, TIBC, IRON, RETICCTPCT in the last 72 hours. Sepsis Labs: Recent Labs  Lab 11/16/19 1634 11/16/19 2101 11/18/19 1838 11/18/19 1915 11/18/19 2134 11/19/19 0046  PROCALCITON  --   --    --  2.90  --  3.04  LATICACIDVEN 3.6* 1.9 1.7  --  1.5  --     Recent Results (from the past 240 hour(s))  SARS CORONAVIRUS 2 (TAT 6-24 HRS) Nasopharyngeal Nasopharyngeal Swab     Status: None   Collection Time: 11/16/19  1:32 PM   Specimen: Nasopharyngeal Swab  Result Value Ref Range Status   SARS Coronavirus 2 NEGATIVE NEGATIVE Final    Comment: (NOTE) SARS-CoV-2 target nucleic acids are NOT DETECTED. The SARS-CoV-2 RNA is generally detectable in upper and lower respiratory specimens during the acute phase of infection. Negative results do not preclude SARS-CoV-2 infection, do not rule out co-infections with other pathogens, and should not be used as the sole basis for treatment or other patient management decisions. Negative results must be combined with clinical observations, patient history, and epidemiological information. The expected result is Negative. Fact Sheet for Patients: SugarRoll.be Fact Sheet for Healthcare Providers: https://www.woods-mathews.com/ This test is not yet approved or cleared by the [place] FDA and  has been authorized for detection and/or diagnosis of SARS-CoV-2 by FDA under an Emergency Use Authorization (EUA). This EUA will remain  in effect (meaning this test can be used) for the  duration of the COVID-19 declaration under Section 56 4(b)(1) of the Act, 21 U.S.C. section 360bbb-3(b)(1), unless the authorization is terminated or revoked sooner. Performed at [facility], [place] [address], [place], [place] [address]   Culture, blood (routine x 2)     Status: None (Preliminary result)   Collection Time: 11/16/19  2:11 PM   Specimen: BLOOD  Result Value Ref Range Status   Specimen Description BLOOD RIGHT ANTECUBITAL  Final   Special Requests   Final    BOTTLES DRAWN AEROBIC AND ANAEROBIC Blood Culture adequate volume   Culture   Final    NO GROWTH 3 DAYS Performed at [facility],  [place] [address] [place], [place] [address]    Report Status PENDING  Incomplete  Culture, blood (routine x 2)     Status: None (Preliminary result)   Collection Time: 11/16/19  2:26 PM   Specimen: BLOOD  Result Value Ref Range Status   Specimen Description BLOOD RIGHT ANTECUBITAL  Final   Special Requests   Final    BOTTLES DRAWN AEROBIC AND ANAEROBIC Blood Culture adequate volume   Culture   Final    NO GROWTH 3 DAYS Performed at [facility], [place] [address], [place], [place] [address]    Report Status PENDING  Incomplete  Urine culture     Status: None   Collection Time: 11/17/19  5:16 AM   Specimen: Urine, Random  Result Value Ref Range Status   Specimen Description URINE, RANDOM  Final   Special Requests NONE  Final   Culture   Final    NO GROWTH Performed at [facility], [place] [address] [place], [place] [address]    Report Status 11/17/2019 FINAL  Final  MRSA PCR Screening     Status: None   Collection Time: 11/18/19  6:00 PM   Specimen: Nasal Mucosa; Nasopharyngeal  Result Value Ref Range Status   MRSA by PCR NEGATIVE NEGATIVE Final    Comment:        The GeneXpert MRSA Assay (FDA approved for NASAL specimens only), is one component of a comprehensive MRSA colonization surveillance program. It is not intended to diagnose MRSA infection nor to guide or monitor treatment for MRSA infections. Performed at [facility], [place] [address], [place], [place] [address]   Culture, blood (routine x 2)     Status: None (Preliminary result)   Collection Time: 11/18/19  6:38 PM   Specimen: BLOOD RIGHT WRIST  Result Value Ref Range Status   Specimen Description BLOOD RIGHT WRIST  Final   Special Requests   Final    BOTTLES DRAWN AEROBIC AND ANAEROBIC Blood Culture results may not be optimal due to an inadequate volume of blood received in culture bottles   Culture   Final    NO GROWTH < 12 HOURS Performed at [facility], [place] [address] [place],  [place] [address]    Report Status PENDING  Incomplete  Culture, blood (routine x 2)     Status: None (Preliminary result)   Collection Time: 11/18/19  6:42 PM   Specimen: BLOOD RIGHT WRIST  Result Value Ref Range Status   Specimen Description BLOOD RIGHT WRIST  Final   Special Requests   Final    BOTTLES DRAWN AEROBIC AND ANAEROBIC Blood Culture adequate volume   Culture   Final    NO GROWTH < 12 HOURS Performed at [facility], [place]  [address], [place], [place] [address]    Report Status PENDING  Incomplete    Radiology Studies: DG CHEST PORT 1 VIEW  Result Date: 11/18/2019 CLINICAL DATA:  86 year old female with a history of tachypnea, shortness of breath, chest pain EXAM: PORTABLE CHEST 1 VIEW COMPARISON:  Chest CT 11/16/2019, plain film 08/06/2016 FINDINGS: Cardiomediastinal silhouette unchanged in size and contour. Calcification of the thoracic aorta. Diffusely coarsened interstitial markings. No pneumothorax. No confluent airspace disease. Surgical changes of the left chest wall.  No pleural effusion. IMPRESSION: Chronic lung changes without evidence of acute cardiopulmonary disease Electronically Signed   By: Corrie Mckusick D.O.   On: 11/18/2019 16:35   VAS [place] LOWER EXTREMITY VENOUS (DVT)  Result Date: 11/19/2019  Lower Venous Study Indications: Elevated d dimer.  Limitations: Patient positioning. Comparison Study: no prior Performing Technologist: Abram Sander RVS  Examination Guidelines: A complete evaluation includes B-mode imaging, spectral Doppler, color Doppler, and power Doppler as needed of all accessible portions of each vessel. Bilateral testing is considered an integral part of a complete examination. Limited examinations for reoccurring indications may be performed as noted.  +---------+---------------+---------+-----------+----------+--------------+ RIGHT    CompressibilityPhasicitySpontaneityPropertiesThrombus Aging  +---------+---------------+---------+-----------+----------+--------------+ CFV      Full           Yes      Yes                                 +---------+---------------+---------+-----------+----------+--------------+ SFJ      Full                                                        +---------+---------------+---------+-----------+----------+--------------+ FV Prox  Full                                                        +---------+---------------+---------+-----------+----------+--------------+ FV Mid   Full                                                        +---------+---------------+---------+-----------+----------+--------------+ FV DistalFull                                                        +---------+---------------+---------+-----------+----------+--------------+ PFV      Full                                                        +---------+---------------+---------+-----------+----------+--------------+ POP      Full           Yes      Yes                                 +---------+---------------+---------+-----------+----------+--------------+  PTV      Full                                                        +---------+---------------+---------+-----------+----------+--------------+ PERO                                                  Not visualized +---------+---------------+---------+-----------+----------+--------------+   +---------+---------------+---------+-----------+----------+--------------+ LEFT     CompressibilityPhasicitySpontaneityPropertiesThrombus Aging +---------+---------------+---------+-----------+----------+--------------+ CFV      Full           Yes      Yes                                 +---------+---------------+---------+-----------+----------+--------------+ SFJ      Full                                                         +---------+---------------+---------+-----------+----------+--------------+ FV Prox  Full                                                        +---------+---------------+---------+-----------+----------+--------------+ FV Mid   Full                                                        +---------+---------------+---------+-----------+----------+--------------+ FV DistalFull                                                        +---------+---------------+---------+-----------+----------+--------------+ PFV      Full                                                        +---------+---------------+---------+-----------+----------+--------------+ POP      Full           Yes      Yes                                 +---------+---------------+---------+-----------+----------+--------------+ PTV      Full                                                        +---------+---------------+---------+-----------+----------+--------------+  PERO     Full                                                        +---------+---------------+---------+-----------+----------+--------------+     Summary: Right: There is no evidence of deep vein thrombosis in the lower extremity. No cystic structure found in the popliteal fossa. Left: There is no evidence of deep vein thrombosis in the lower extremity. No cystic structure found in the popliteal fossa.  *See table(s) above for measurements and observations.    Preliminary    Scheduled Meds: . pneumococcal 23 valent vaccine  0.5 mL Intramuscular Tomorrow-1000   Continuous Infusions: . ceFEPime (MAXIPIME) IV 2 g (11/18/19 2044)  . potassium PHOSPHATE IVPB (in mmol)    . sodium bicarbonate 150 mEq in dextrose 5% 1000 mL 150 mEq (11/19/19 0951)  . vancomycin 750 mg (11/18/19 2151)    LOS: 3 days   Kerney Elbe, DO [facility] PAGER is on [place]  If 7PM-7AM, please contact night-coverage [email or web address]

## 2019-11-20 ENCOUNTER — Inpatient Hospital Stay (HOSPITAL_COMMUNITY): Payer: Medicare Other

## 2019-11-20 ENCOUNTER — Other Ambulatory Visit (HOSPITAL_COMMUNITY): Payer: Medicare Other

## 2019-11-20 DIAGNOSIS — I350 Nonrheumatic aortic (valve) stenosis: Secondary | ICD-10-CM

## 2019-11-20 DIAGNOSIS — I361 Nonrheumatic tricuspid (valve) insufficiency: Secondary | ICD-10-CM

## 2019-11-20 LAB — COMPREHENSIVE METABOLIC PANEL
ALT: 13 U/L (ref 0–44)
AST: 20 U/L (ref 15–41)
Albumin: 1.8 g/dL — ABNORMAL LOW (ref 3.5–5.0)
Alkaline Phosphatase: 124 U/L (ref 38–126)
Anion gap: 11 (ref 5–15)
BUN: 9 mg/dL (ref 8–23)
CO2: 24 mmol/L (ref 22–32)
Calcium: 7.7 mg/dL — ABNORMAL LOW (ref 8.9–10.3)
Chloride: 101 mmol/L (ref 98–111)
Creatinine, Ser: 0.75 mg/dL (ref 0.44–1.00)
GFR calc Af Amer: >60 mL/min (ref >60)
GFR calc non Af Amer: >60 mL/min (ref >60)
Glucose, Bld: 105 mg/dL — ABNORMAL HIGH (ref 70–99)
Potassium: 3.1 mmol/L — ABNORMAL LOW (ref 3.5–5.1)
Sodium: 136 mmol/L (ref 135–145)
Total Bilirubin: 0.9 mg/dL (ref 0.3–1.2)
Total Protein: 5.4 g/dL — ABNORMAL LOW (ref 6.5–8.1)

## 2019-11-20 LAB — TYPE AND SCREEN
ABO/RH(D): B POS
Antibody Screen: NEGATIVE
Unit division: 0
Unit division: 0

## 2019-11-20 LAB — BPAM RBC
Blood Product Expiration Date: 202102212359
Blood Product Expiration Date: 202102232359
ISSUE DATE / TIME: 202101291141
ISSUE DATE / TIME: 202101291535
Unit Type and Rh: 7300
Unit Type and Rh: 7300

## 2019-11-20 LAB — CBC WITH DIFFERENTIAL/PLATELET
Abs Immature Granulocytes: 0.21 10*3/uL — ABNORMAL HIGH (ref 0.00–0.07)
Basophils Absolute: 0.1 10*3/uL (ref 0.0–0.1)
Basophils Relative: 0 %
Eosinophils Absolute: 0.2 10*3/uL (ref 0.0–0.5)
Eosinophils Relative: 1 %
HCT: 31.7 % — ABNORMAL LOW (ref 36.0–46.0)
Hemoglobin: 11.1 g/dL — ABNORMAL LOW (ref 12.0–15.0)
Immature Granulocytes: 1 %
Lymphocytes Relative: 7 %
Lymphs Abs: 1.4 10*3/uL (ref 0.7–4.0)
MCH: 29.6 pg (ref 26.0–34.0)
MCHC: 35 g/dL (ref 30.0–36.0)
MCV: 84.5 fL (ref 80.0–100.0)
Monocytes Absolute: 2 10*3/uL — ABNORMAL HIGH (ref 0.1–1.0)
Monocytes Relative: 9 %
Neutro Abs: 17.7 10*3/uL — ABNORMAL HIGH (ref 1.7–7.7)
Neutrophils Relative %: 82 %
Platelets: 346 10*3/uL (ref 150–400)
RBC: 3.75 MIL/uL — ABNORMAL LOW (ref 3.87–5.11)
RDW: 13.1 % (ref 11.5–15.5)
WBC: 21.6 10*3/uL — ABNORMAL HIGH (ref 4.0–10.5)
nRBC: 0.1 % (ref 0.0–0.2)

## 2019-11-20 LAB — PHOSPHORUS: Phosphorus: 2.5 mg/dL (ref 2.5–4.6)

## 2019-11-20 LAB — MAGNESIUM: Magnesium: 2.3 mg/dL (ref 1.7–2.4)

## 2019-11-20 LAB — ECHOCARDIOGRAM COMPLETE
Height: 62 in
Weight: 1544 oz

## 2019-11-20 LAB — PROCALCITONIN: Procalcitonin: 13.84 ng/mL

## 2019-11-20 MED ORDER — SODIUM CHLORIDE 0.9 % IV BOLUS
500.0000 mL | Freq: Once | INTRAVENOUS | Status: AC
  Administered 2019-11-20: 23:00:00 500 mL via INTRAVENOUS

## 2019-11-20 MED ORDER — IBUPROFEN 600 MG PO TABS
600.0000 mg | ORAL_TABLET | Freq: Once | ORAL | Status: AC
  Administered 2019-11-20: 23:00:00 600 mg via ORAL
  Filled 2019-11-20: qty 1

## 2019-11-20 MED ORDER — POTASSIUM CHLORIDE CRYS ER 20 MEQ PO TBCR
40.0000 meq | EXTENDED_RELEASE_TABLET | Freq: Two times a day (BID) | ORAL | Status: AC
  Administered 2019-11-20 (×2): 40 meq via ORAL
  Filled 2019-11-20 (×2): qty 2

## 2019-11-20 MED ORDER — PIPERACILLIN-TAZOBACTAM 3.375 G IVPB
3.3750 g | Freq: Three times a day (TID) | INTRAVENOUS | Status: DC
  Administered 2019-11-20 – 2019-11-23 (×9): 3.375 g via INTRAVENOUS
  Filled 2019-11-20 (×9): qty 50

## 2019-11-20 NOTE — Progress Notes (Signed)
PROGRESS NOTE    [name]  IOX:[id number] DOB: 1932/07/20 DOA: 11/16/2019 PCP: Lucianne Lei, MD   Brief Narrative:  HPI per Dr. Dana Allan on 11/15/2018 Patient is an 86 year old female with past medical history significant for breast cancer, hypertension, hyperlipidemia, chronic abdominal pain, rheumatoid arthritis and chronic kidney disease stage IIIA/B.  Patient was seen by the GI provider earlier today for abdominal pain.  Apparently, patient has had abdominal pain for about 2 weeks.  Patient reported constant lower abdominal pain.  There is associated poor p.o. intake and poor appetite.  Patient reported significant weight loss.  On presentation to the GI provider's office, patient was found to be hypotensive, with systolic blood pressure in the 70s.  Patient was advised to come to the hospital for further assessment and management.  Lab work done at the hospital revealed CBC of 20.2, hemoglobin of 11.1, hematocrit of 34.3 and platelet count of 563.  Chemistry revealed sodium of 134, potassium of 5.4, chloride 102, CO2 of 19, BUN of 34 with serum creatinine of 1.3 from blood sugar of 136.  INR of 1.5 and lactic acid of 3.6 were reported as well.  D-dimer was elevated.  CTA of the chest and abdomen revealed lung emphysema, bilateral lower lung lobe atelectasis, large stable soft tissue mass around the tail of pancreas and numerous liver metastasis.  Evolving splenic infarct was also queried.  No headache, no neck, no chest pain, no shortness of breath, no no nausea vomiting or change in bowel habit.  Hospitalist team will admit patient for further assessment and management.  ED Course: On presentation to the emergency room, vitals revealed temperature of 97.3, blood pressure of 72/40 - 135/97 mmHg, heart rate of 64-1 8/min and respiratory rate of 1428.  O2 sat is 98% on room air.  Lab work is as documented above.  Patient has received vitamin K for elevated INR.  Patient has been  aggressively hydrated, pancultured and started on antibiotics.  The hospitalist team has been called to admit patient.  **Interim History  Patient had a repeat CT scan as above and IR was consulted for liver lesion biopsy given is the safest option and most efficacious way of determining source of her primary cancer given her multiple liver metastasis given that the GI had limited endoscopic options for diagnosis. Likely source of Cancer is Pancreatic Cancer vs. Recurrent Breast Cancer.  Oncology was consulted for further evaluation recommendations and she is still awaiting a biopsy.  Palliative care was also called for goals of care consultation  She has been spiking temperatures but was febrile overnight.  Blood cultures will obtained again and she is given acetaminophen.  WBC has elevated and procalcitonin is also elevated.  Biopsy has been canceled for now until she is more medically stable and likely will be done Monday.  Because her WBC count was elevated and her procalcitonin was worsening I discussed the case with infectious disease Dr. Graylon Good who recommends de-escalating antibiotics to IV Zosyn for a couple more days and obtaining an echocardiogram and abdominal ultrasound to look for further sources infection.  Dr. Graylon Good clinically feels that her symptoms are not attributable to an active infection but likely secondary to a malignancy and recommends liver biopsy Monday. Her hemoglobin/hematocrit is on the lower side.  Been screened and transfused 2 units PRBCs  Assessment & Plan:   Active Problems:   Sepsis ([place])   Protein-calorie malnutrition, severe   Palliative care by specialist   Goals of  care, counseling/discussion   Splenic mass   Liver metastases ([place])   Pancreatic mass  Sepsis of Unclear etiology -Admit patient for further assessment and management. -Unclear etiology -She was hypotensive, tachycardic, tachypneic and had a leukocytosis and unclear source but she does have  widespread disease; also admitted lactic acidosis on admission; she has intermittently been febrile -Procalcitonin was elevated and went from 2.90 and is now 13.84 Continue with empiric antibiotics with IV vancomycin and IV cefepime for now and will check MRSA PCR and de-escalate and stop vancomycin if negative -IV fluid hydration has now stopped -WBC remains elevated; initially was improving but it went from 19.7 -> 15.3 -> 25.6 -> 24.6 and now is improving again to 21.6 -Continue to follow blood cultures and re-cultured the day before yesterday -Urinalysis was unremarkable but did show some rare bacteria  -Blood cultures and urine culture done and blood culture showed no growth to date at 4 days and urine culture showed no growth; Repeat Blood Cx no growth to date at 2 days -CT of the Chest/Abdomen/Pelvis showed "Moderate severity emphysematous lung disease. Mild left upper lobe and bilateral lower lobe atelectasis.  Large stable soft tissue mass along the region of the pancreatic tail and splenic hilum, consistent with primary versus metastatic neoplastic disease. Numerous stable liver lesions consistent with liver metastasis. Stable soft tissue masses within the mid to upper left abdomen and region of the splenic flexure consistent with additional neoplastic lesions. Stable findings likely consistent with an evolving splenic infarct. No evidence of a retroperitoneal hematoma in the presence of lower abdominal pain and hypotension."  -Patient complaining still complaining about abdominal pain so we will continue acetaminophen but will also add oxycodone IR -She was febrile again the day before yesterday so given Acetaminophen -Continue to monitor temperature curve and repeat CBC in a.m. -I spoke with infectious diseases Dr. Carlyle Basques who recommends de-escalating antibiotics to just IV Zosyn which I have done.  I have stopped the IV vancomycin and IV cefepime.  She recommends obtaining an  echocardiogram to rule out possible valve vegetation and obtaining a abdominal ultrasound to look for her ascites given her history of hepatitis -Dr. Baxter Flattery clinically does not feel that the patient has an active infection and feels that symptoms are likely related to malignancy and recommends that we find the etiology with a biopsy of her primary cancer.  Elevated D-dimer -D-Dimer was 3.14 -Had a CTA of the chest PE study which showed no Filling Defects -Will obtain a LE Venous Duplex and this was Negative for DVT bilaterally   Coagulopathy -Had an INR of 1.5 on Admission and given 5 mg po Vitamin K -Repeat INR this AM a few days ago was 1.3 -Repeat PT/INR in a.m.  Hepatitis C with cirrhosis and SVR -She is status post Harvoni in 2017 -Followed by a hepatologist at [facility]  -previously was drinking 2 cans of beer daily but stopped -Checking ultrasound limited to evaluate for ascites and if she does have ascites will need a therapeutic and diagnostic paracentesis  Widespread metastatic disease with liver lesions and anorexia, weight loss intermittent abdominal pain in the setting of pancreatic cancer versus recurrent breast cancer -Has a history of Breast Cancer status post radiation, left breast mastectomy as well as Arimidex and follows with Dr. Lindi Adie -Patient will need tissue diagnosis for her metastatic carcinoma -Have a large pancreatic mass with liver metastasis which is concerning for metastatic pancreatic cancer however unfortunately until a biopsy is obtained cannot rule  out metastatic breast cancer -Consult IR for liver biopsy and this did not happen yesterday but will hopefully happen today -Also consulted medical oncology Dr. Nicholas Lose and appreciate his insight and recc's: Dr. Quintella Reichert believes that if she does have metastatic pancreatic cancer she has an extremely poor prognosis and he would not offer her any systemic chemotherapy and recommend that hospice care would be indicated  but if she does have metastatic breast cancer that he would consider systemic treatment options -Her AFP tumor marker was 5.7 and CA 19-9 was <3 -Likely contributing to the abdominal pain.  -Plan for consultants including IR and medical oncology and likely needs a biopsy at some point and hopefully this can be done Monday  Hypotension, stable -Patient has had poor p.o. intake -Blood pressure is on the lower side and today it was 109/54 -Monitor closely -Hold Amlodipine for now and continue monitor blood pressures per protocol -Presented with a blood pressure 72/40 -TSH was normal   Lactic Acidosis -The setting of sepsis of unknown etiology and dehydration -Improved as lactic acid level from 3.2 is now 1.9 improved  AKI, improved -In the setting of very poor p.o. intake and dehydration and hypotension -Patient's BUN/creatinine went from 34/1.34 and today it was 9/0.75 -IV fluid hydration is now stopped -Avoid nephrotoxic medications, contrast dyes, hypotension and renally adjust medications -Repeat CMP P in the a.m.  Metabolic Acidosis -CO2 on admission was 19 and today it has dropped to 14; now CO2 is 24, chloride level is 101, anion gap is 11 -Given a total of 2 L fluid boluses and started on maintenance IV fluids at 125 mL's per hour and will drop to 100 mL/h and now changed to 75 mL's per hour of sodium bicarbonate; IV Sodium Bicarbonate Rate increased to 100 mL on 11/19/2019 and IV fluids have stopped today -Continue to monitor and trend and repeat CMP in a.m.  Normocytic Anemia  -Patient's hemoglobin/hematocrit went from 11.1/34.3 and then dropped to 8.6/26.9 and now further dropped to 7.4/23.4 -> 7.1/22.8 -> 7.2/22.5; question if this could be a dilutional drop vs Bleeding -transfuse if hemoglobin falls less than 7; could be in the setting of chronic disease and malignancy -Check anemia panel but not done -Will Type and Screen and transfuse 2 units of pRBC's and repeat  hemoglobin after this morning was 11.1/31.7 -Continue to monitor for symptoms of bleeding; currently no overt bleeding noted -Repeat CBC in a.m.  Hypomagnesemia -Patient's magnesium level 1.5 and improved to 2.3 -Replete with IV mag sulfate 3 g -continue monitor and replete as necessary -Repeat CMP in a.m.  Hypophosphatemia -Patient's phosphorus level was 2.4 and is now 2.5 -Replete with IV K-Phos 10 mmol yesterday -Continue to monitor and replete as necessary -Repeat phosphorus level in a.m.  Thrombocytosis  -Lately count on admission was 563 and then dropped to 450 now has normalized at 394 -> 387 -> 391 -> 346; likely in the setting of her widespread metastatic disease and sepsis picture -Continue to monitor for signs and symptoms of bleeding -Repeat CBC in a.m.  Poor p.o. intake/Underweight/severe malnutrition in the context of chronic illness -Consult Dietitian for further evaluation and recommendations  -Now that she is on a diet appreciate further recommendations  Hypokalemia -Patient's potassium this morning was 3.1 -Replete with p.o. potassium chloride 40 mEQ twice daily x2 doses -Continue to monitor and replete as necessary -Repeat CMP in a.m.  DVT prophylaxis: SCDs given that she will be going for Liver Biopsy  Code  Status: FULL CODE   Family Communication: No family present at bedside  Disposition Plan: Patient came from: Home                                                                                                                          Anticipated d/c place: To be determined Barriers to d/c OR conditions which need to be met to effect a safe d/c: Further work-up with liver biopsy evaluation with oncology and IR as well as palliative care goals of care discussion as well as medical stability and treatment of her sepsis  Consultants:   Gastroenterology Dr. Tarri Glenn  Medical Oncology Dr. Lindi Adie  Interventional Radiology  Palliative Care Medicine    Discussed the case with infectious diseases Dr. Carlyle Basques   Procedures: Liver Biopsy to be done LE Venous Duplex  ECHOCARDIOGRAM IMPRESSIONS    1. Left ventricular ejection fraction, by visual estimation, is 70 to  75%. The left ventricle has hyperdynamic function. There is no left  ventricular hypertrophy.  2. Left ventricular diastolic parameters are consistent with Grade I  diastolic dysfunction (impaired relaxation).  3. The left ventricle has no regional wall motion abnormalities.  4. Global right ventricle has normal systolic function.The right  ventricular size is normal. No increase in right ventricular wall  thickness.  5. Left atrial size was normal.  6. Right atrial size was normal.  7. The mitral valve is degenerative. No evidence of mitral valve  regurgitation. No evidence of mitral stenosis.  8. The tricuspid valve is normal in structure.  9. The tricuspid valve is normal in structure. Tricuspid valve  regurgitation is mild.  10. Aortic valve mean gradient measures 11.0 mmHg.  11. Aortic valve peak gradient measures 21.9 mmHg.  12. The aortic valve is tricuspid. Aortic valve regurgitation is not  visualized. Mild aortic valve stenosis.  13. The pulmonic valve was normal in structure. Pulmonic valve  regurgitation is not visualized.  14. The inferior vena cava is normal in size with greater than 50%  respiratory variability, suggesting right atrial pressure of 3 mmHg.   FINDINGS  Left Ventricle: Left ventricular ejection fraction, by visual estimation,  is 70 to 75%. The left ventricle has hyperdynamic function. The left  ventricle has no regional wall motion abnormalities. There is no left  ventricular hypertrophy. Left  ventricular diastolic parameters are consistent with Grade I diastolic  dysfunction (impaired relaxation). Normal left atrial pressure.   Right Ventricle: The right ventricular size is normal. No increase in  right  ventricular wall thickness. Global RV systolic function is has  normal systolic function.   Left Atrium: Left atrial size was normal in size.   Right Atrium: Right atrial size was normal in size   Pericardium: There is no evidence of pericardial effusion.   Mitral Valve: The mitral valve is degenerative in appearance. There is  mild thickening of the mitral valve leaflet(s). No evidence of mitral  valve regurgitation. No evidence of mitral  valve stenosis by observation.   Tricuspid Valve: The tricuspid valve is normal in structure. Tricuspid  valve regurgitation is mild.   Aortic Valve: The aortic valve is tricuspid. . There is moderate  thickening and mild calcification of the aortic valve. Aortic valve  regurgitation is not visualized. Mild aortic stenosis is present. There is  moderate thickening of the aortic valve. There  is mild calcification of the aortic valve. Aortic valve mean gradient  measures 11.0 mmHg. Aortic valve peak gradient measures 21.9 mmHg. Aortic  valve area, by VTI measures 1.62 cm.   Pulmonic Valve: The pulmonic valve was normal in structure. Pulmonic valve  regurgitation is not visualized. Pulmonic regurgitation is not visualized.   Aorta: The aortic root, ascending aorta and aortic arch are all  structurally normal, with no evidence of dilitation or obstruction.   Venous: The inferior vena cava is normal in size with greater than 50%  respiratory variability, suggesting right atrial pressure of 3 mmHg.   IAS/Shunts: No atrial level shunt detected by color flow Doppler. There is  no evidence of a patent foramen ovale. No ventricular septal defect is  seen or detected. There is no evidence of an atrial septal defect.     LEFT VENTRICLE  PLAX 2D  LVIDd:     3.10 cm Diastology  LVIDs:     1.80 cm LV e' lateral:  11.10 cm/s  LV PW:     0.90 cm LV E/e' lateral: 6.1  LV IVS:    0.90 cm LV e' medial:  9.36 cm/s  LVOT diam:    1.75 cm LV E/e' medial: 7.3  LV SV:     28 ml  LV SV Index:  20.49  LVOT Area:   2.41 cm     RIGHT VENTRICLE  TAPSE (M-mode): 1.8 cm   LEFT ATRIUM      Index    RIGHT ATRIUM      Index  LA diam:   3.50 cm 2.50 cm/m RA Area:   13.00 cm  LA Vol (A4C): 36.3 ml 25.88 ml/m RA Volume:  29.90 ml 21.32 ml/m  AORTIC VALVE  AV Area (Vmax):  1.50 cm  AV Area (Vmean):  1.80 cm  AV Area (VTI):   1.62 cm  AV Vmax:      234.00 cm/s  AV Vmean:     155.000 cm/s  AV VTI:      0.375 m  AV Peak Grad:   21.9 mmHg  AV Mean Grad:   11.0 mmHg  LVOT Vmax:     146.00 cm/s  LVOT Vmean:    116.000 cm/s  LVOT VTI:     0.252 m  LVOT/AV VTI ratio: 0.67    AORTA  Ao Root diam: 2.90 cm   MITRAL VALVE  MV Area (PHT): 4.21 cm       SHUNTS  MV PHT:    52.20 msec      Systemic VTI: 0.25 m  MV Decel Time: 180 msec       Systemic Diam: 1.75 cm  MV E velocity: 68.10 cm/s 103 cm/s  MV A velocity: 72.00 cm/s 70.3 cm/s  MV E/A ratio: 0.95    1.5    Antimicrobials: Anti-infectives (From admission, onward)   Start     Dose/Rate Route Frequency Ordered Stop   11/20/19 1200  piperacillin-tazobactam (ZOSYN) IVPB 3.375 g     3.375 g 12.5 mL/hr over 240 Minutes Intravenous Every 8 hours 11/20/19 1134  11/16/19 2030  ceFEPIme (MAXIPIME) 2 g in sodium chloride 0.9 % 100 mL IVPB  Status:  Discontinued     2 g 200 mL/hr over 30 Minutes Intravenous Every 24 hours 11/16/19 2017 11/20/19 1130   11/16/19 2030  vancomycin (VANCOREADY) IVPB 750 mg/150 mL  Status:  Discontinued     750 mg 150 mL/hr over 60 Minutes Intravenous Every 48 hours 11/16/19 2017 11/20/19 1130   11/16/19 1500  piperacillin-tazobactam (ZOSYN) IVPB 3.375 g     3.375 g 100 mL/hr over 30 Minutes Intravenous  Once 11/16/19 1456 11/16/19 1637     Subjective: Seen and Examined at bedside states that her abdominal pain was stable.  She is  wearing supplemental oxygen when I saw her this morning.  She denies any current complaints and denies any shortness breath, chest pain, lightheadedness or dizziness or burning or discomfort in her urine.  Hoping to get the biopsy soon.  No other concerns or complaints at this time.  Objective: Vitals:   11/20/19 1145 11/20/19 1200 11/20/19 1300 11/20/19 1617  BP: 122/60   (!) 109/54  Pulse: 100 96  93  Resp: (!) '22 20  19  '$ Temp: 99 F (37.2 C)   99.8 F (37.7 C)  TempSrc: Oral   Oral  SpO2: 91%  94% 93%  Weight:      Height:        Intake/Output Summary (Last 24 hours) at 11/20/2019 1746 Last data filed at 11/20/2019 1532 Gross per 24 hour  Intake 332.3 ml  Output 775 ml  Net -442.7 ml   Filed Weights   11/16/19 1244 11/16/19 2240  Weight: 44.2 kg 43.8 kg   Examination: Physical Exam:  Constitutional: Thin elderly African-American female currently in no acute distress appears calm and does not appear more comfortable than yesterday Eyes: Lids and conjunctivae normal, sclerae anicteric  ENMT: External Ears, Nose appear normal. Grossly normal hearing. Mucous membranes are moist. Neck: Appears normal, supple, no cervical masses, normal ROM, no appreciable thyromegaly; no JVD Respiratory: Diminished to auscultation bilaterally, no wheezing, rales, rhonchi or crackles. Normal respiratory effort and patient is not tachypenic. No accessory muscle use.  Wearing supplemental oxygen via nasal cannula today Cardiovascular: RRR, no murmurs / rubs / gallops. S1 and S2 auscultated. No extremity edema.   Abdomen: Soft, non-tender, non-distended. Bowel sounds positive.  GU: Deferred. Musculoskeletal: No clubbing / cyanosis of digits/nails. No joint deformity upper and lower extremities.  Skin: No rashes, lesions, ulcers on limited skin evaluation. No induration; Warm and dry.  Neurologic: CN 2-12 grossly intact with no focal deficits. Romberg sign and cerebellar reflexes not assessed.   Psychiatric: Normal judgment and insight. Alert and oriented x 3. Normal mood and appropriate affect.   Data Reviewed: I have personally reviewed following labs and imaging studies  CBC: Recent Labs  Lab 11/16/19 1033 11/16/19 1033 11/17/19 0221 11/17/19 0221 11/18/19 1020 11/18/19 1915 11/19/19 0046 11/19/19 2135 11/20/19 0314  WBC 20.2 Repeated and verified X2.*   < > 19.7*  --  15.3* 25.6* 24.6*  --  21.6*  NEUTROABS 17.0*  --   --   --  11.6* 23.3* 21.4*  --  17.7*  HGB 11.1*   < > 8.6*   < > 7.4* 7.1* 7.2* 12.1 11.1*  HCT 34.3*   < > 26.9*   < > 23.4* 22.8* 22.5* 35.6* 31.7*  MCV 88.9   < > 90.6  --  89.7 91.6 89.3  --  84.5  PLT 563.0*   < > 450*  --  394 387 391  --  346   < > = values in this interval not displayed.   Basic Metabolic Panel: Recent Labs  Lab 11/16/19 1033 11/16/19 2333 11/17/19 0221 11/18/19 1020 11/18/19 1915 11/19/19 0046 11/20/19 0314  NA   < >  --  137 138 138 138 136  K   < >  --  4.8 3.6 3.8 4.0 3.1*  CL   < >  --  109 115* 112* 111 101  CO2   < >  --  17* 17* 14* 14* 24  GLUCOSE   < >  --  95 94 96 62* 105*  BUN   < >  --  '23 13 12 12 9  '$ CREATININE   < >  --  1.04* 0.84 0.84 0.87 0.75  CALCIUM   < >  --  8.7* 8.2* 8.1* 8.1* 7.7*  MG  --  2.0  --  1.7 1.4* 1.5* 2.3  PHOS  --  2.8  --  2.6 2.4* 2.4* 2.5   < > = values in this interval not displayed.   GFR: Estimated Creatinine Clearance: 35.5 mL/min (by C-G formula based on SCr of 0.75 mg/dL). Liver Function Tests: Recent Labs  Lab 11/17/19 0221 11/18/19 1020 11/18/19 1915 11/19/19 0046 11/20/19 0314  AST '24 20 21 24 20  '$ ALT '16 12 13 11 13  '$ ALKPHOS 158* 131* 133* 134* 124  BILITOT 0.7 0.4 0.9 0.9 0.9  PROT 6.4* 5.5* 5.6* 5.6* 5.4*  ALBUMIN 2.4* 1.9* 2.0* 2.0* 1.8*   No results for input(s): LIPASE, AMYLASE in the last 168 hours. No results for input(s): AMMONIA in the last 168 hours. Coagulation Profile: Recent Labs  Lab 11/16/19 1033 11/18/19 1222  INR 1.5* 1.3*    Cardiac Enzymes: No results for input(s): CKTOTAL, CKMB, CKMBINDEX, TROPONINI in the last 168 hours. BNP (last 3 results) No results for input(s): PROBNP in the last 8760 hours. HbA1C: No results for input(s): HGBA1C in the last 72 hours. CBG: Recent Labs  Lab 11/19/19 0241  GLUCAP 142*   Lipid Profile: No results for input(s): CHOL, HDL, LDLCALC, TRIG, CHOLHDL, LDLDIRECT in the last 72 hours. Thyroid Function Tests: No results for input(s): TSH, T4TOTAL, FREET4, T3FREE, THYROIDAB in the last 72 hours. Anemia Panel: No results for input(s): VITAMINB12, FOLATE, FERRITIN, TIBC, IRON, RETICCTPCT in the last 72 hours. Sepsis Labs: Recent Labs  Lab 11/16/19 1634 11/16/19 2101 11/18/19 1838 11/18/19 1915 11/18/19 2134 11/19/19 0046 11/20/19 0314  PROCALCITON  --   --   --  2.90  --  3.04 13.84  LATICACIDVEN 3.6* 1.9 1.7  --  1.5  --   --     Recent Results (from the past 240 hour(s))  SARS CORONAVIRUS 2 (TAT 6-24 HRS) Nasopharyngeal Nasopharyngeal Swab     Status: None   Collection Time: 11/16/19  1:32 PM   Specimen: Nasopharyngeal Swab  Result Value Ref Range Status   SARS Coronavirus 2 NEGATIVE NEGATIVE Final    Comment: (NOTE) SARS-CoV-2 target nucleic acids are NOT DETECTED. The SARS-CoV-2 RNA is generally detectable in upper and lower respiratory specimens during the acute phase of infection. Negative results do not preclude SARS-CoV-2 infection, do not rule out co-infections with other pathogens, and should not be used as the sole basis for treatment or other patient management decisions. Negative results must be combined with clinical observations, patient history, and epidemiological information. The expected  result is Negative. Fact Sheet for Patients: SugarRoll.be Fact Sheet for Healthcare Providers: https://www.woods-mathews.com/ This test is not yet approved or cleared by the [place] FDA and  has been  authorized for detection and/or diagnosis of SARS-CoV-2 by FDA under an Emergency Use Authorization (EUA). This EUA will remain  in effect (meaning this test can be used) for the duration of the COVID-19 declaration under Section 56 4(b)(1) of the Act, 21 U.S.C. section 360bbb-3(b)(1), unless the authorization is terminated or revoked sooner. Performed at [facility], [place] [address], [place], [place] [address]   Culture, blood (routine x 2)     Status: None (Preliminary result)   Collection Time: 11/16/19  2:11 PM   Specimen: BLOOD  Result Value Ref Range Status   Specimen Description BLOOD RIGHT ANTECUBITAL  Final   Special Requests   Final    BOTTLES DRAWN AEROBIC AND ANAEROBIC Blood Culture adequate volume   Culture   Final    NO GROWTH 4 DAYS Performed at [facility], [place] [address], [place], [place] [address]    Report Status PENDING  Incomplete  Culture, blood (routine x 2)     Status: None (Preliminary result)   Collection Time: 11/16/19  2:26 PM   Specimen: BLOOD  Result Value Ref Range Status   Specimen Description BLOOD RIGHT ANTECUBITAL  Final   Special Requests   Final    BOTTLES DRAWN AEROBIC AND ANAEROBIC Blood Culture adequate volume   Culture   Final    NO GROWTH 4 DAYS Performed at [facility], [place] [address] [place], [place] [address]    Report Status PENDING  Incomplete  Urine culture     Status: None   Collection Time: 11/17/19  5:16 AM   Specimen: Urine, Random  Result Value Ref Range Status   Specimen Description URINE, RANDOM  Final   Special Requests NONE  Final   Culture   Final    NO GROWTH Performed at [facility], [place] [address], [place], [place] [address]    Report Status 11/17/2019 FINAL  Final  MRSA PCR Screening     Status: None   Collection Time: 11/18/19  6:00 PM   Specimen: Nasal Mucosa; Nasopharyngeal  Result Value Ref Range Status   MRSA by PCR NEGATIVE NEGATIVE Final    Comment:        The GeneXpert  MRSA Assay (FDA approved for NASAL specimens only), is one component of a comprehensive MRSA colonization surveillance program. It is not intended to diagnose MRSA infection nor to guide or monitor treatment for MRSA infections. Performed at [facility], [place] [address], [place], [place] [address]   Culture, blood (routine x 2)     Status: None (Preliminary result)   Collection Time: 11/18/19  6:38 PM   Specimen: BLOOD RIGHT WRIST  Result Value Ref Range Status   Specimen Description BLOOD RIGHT WRIST  Final   Special Requests   Final    BOTTLES DRAWN AEROBIC AND ANAEROBIC Blood Culture results may not be optimal due to an inadequate volume of blood received in culture bottles   Culture   Final    NO GROWTH 2 DAYS Performed at [facility], [place] [address], [place], [place] [address]    Report Status PENDING  Incomplete  Culture, blood (routine x 2)     Status: None (Preliminary result)   Collection Time: 11/18/19  6:42 PM   Specimen:  BLOOD RIGHT WRIST  Result Value Ref Range Status   Specimen Description BLOOD RIGHT WRIST  Final   Special Requests   Final    BOTTLES DRAWN AEROBIC AND ANAEROBIC Blood Culture adequate volume   Culture   Final    NO GROWTH 2 DAYS Performed at [facility], [address] [place], [place] [address]    Report Status PENDING  Incomplete    Radiology Studies: DG Abd Portable 1V  Result Date: 11/20/2019 CLINICAL DATA:  Abdominal pain. EXAM: PORTABLE ABDOMEN - 1 VIEW COMPARISON:  CT abdomen pelvis 11/16/2019 FINDINGS: Small left pleural effusion and underlying consolidation. Small amount of gas within nondilated loops of bowel throughout the abdomen. Supine evaluation limited for the detection of free intraperitoneal air. Aortic atherosclerosis. IMPRESSION: Nonobstructed bowel gas pattern. Small left pleural effusion and underlying consolidation. Electronically Signed   By: Lovey Newcomer M.D.   On: 11/20/2019 07:38   ECHOCARDIOGRAM  COMPLETE  Result Date: 11/20/2019   ECHOCARDIOGRAM REPORT   Patient Name:   [name] Date of Exam: 11/20/2019 Medical Rec #:  [id number]            Height:       62.0 in Accession #:    [id number]           Weight:       96.5 lb Date of Birth:  07-Mar-1932            BSA:          1.40 m Patient Age:    75 years             BP:           122/60 mmHg Patient Gender: F                    HR:           96 bpm. Exam Location:  Inpatient Procedure: 2D Echo Indications:    fever 780.6  History:        Patient has prior history of Echocardiogram examinations, most                 recent 03/08/2015. Risk Factors:Hypertension and Former Smoker.  Sonographer:    Jannett Celestine RDCS (AE) Referring Phys: [id number] Georgina Quint LATIF [facility]  Sonographer Comments: restricted mobiliy. off axis apical window IMPRESSIONS  1. Left ventricular ejection fraction, by visual estimation, is 70 to 75%. The left ventricle has hyperdynamic function. There is no left ventricular hypertrophy.  2. Left ventricular diastolic parameters are consistent with Grade I diastolic dysfunction (impaired relaxation).  3. The left ventricle has no regional wall motion abnormalities.  4. Global right ventricle has normal systolic function.The right ventricular size is normal. No increase in right ventricular wall thickness.  5. Left atrial size was normal.  6. Right atrial size was normal.  7. The mitral valve is degenerative. No evidence of mitral valve regurgitation. No evidence of mitral stenosis.  8. The tricuspid valve is normal in structure.  9. The tricuspid valve is normal in structure. Tricuspid valve regurgitation is mild. 10. Aortic valve mean gradient measures 11.0 mmHg. 11. Aortic valve peak gradient measures 21.9 mmHg. 12. The aortic valve is tricuspid. Aortic valve regurgitation is not visualized. Mild aortic valve stenosis. 13. The pulmonic valve was normal in structure. Pulmonic valve regurgitation is not visualized. 14. The inferior vena  cava is normal in size with greater than 50% respiratory variability, suggesting right atrial  pressure of 3 mmHg. FINDINGS  Left Ventricle: Left ventricular ejection fraction, by visual estimation, is 70 to 75%. The left ventricle has hyperdynamic function. The left ventricle has no regional wall motion abnormalities. There is no left ventricular hypertrophy. Left ventricular diastolic parameters are consistent with Grade I diastolic dysfunction (impaired relaxation). Normal left atrial pressure. Right Ventricle: The right ventricular size is normal. No increase in right ventricular wall thickness. Global RV systolic function is has normal systolic function. Left Atrium: Left atrial size was normal in size. Right Atrium: Right atrial size was normal in size Pericardium: There is no evidence of pericardial effusion. Mitral Valve: The mitral valve is degenerative in appearance. There is mild thickening of the mitral valve leaflet(s). No evidence of mitral valve regurgitation. No evidence of mitral valve stenosis by observation. Tricuspid Valve: The tricuspid valve is normal in structure. Tricuspid valve regurgitation is mild. Aortic Valve: The aortic valve is tricuspid. . There is moderate thickening and mild calcification of the aortic valve. Aortic valve regurgitation is not visualized. Mild aortic stenosis is present. There is moderate thickening of the aortic valve. There  is mild calcification of the aortic valve. Aortic valve mean gradient measures 11.0 mmHg. Aortic valve peak gradient measures 21.9 mmHg. Aortic valve area, by VTI measures 1.62 cm. Pulmonic Valve: The pulmonic valve was normal in structure. Pulmonic valve regurgitation is not visualized. Pulmonic regurgitation is not visualized. Aorta: The aortic root, ascending aorta and aortic arch are all structurally normal, with no evidence of dilitation or obstruction. Venous: The inferior vena cava is normal in size with greater than 50% respiratory  variability, suggesting right atrial pressure of 3 mmHg. IAS/Shunts: No atrial level shunt detected by color flow Doppler. There is no evidence of a patent foramen ovale. No ventricular septal defect is seen or detected. There is no evidence of an atrial septal defect.  LEFT VENTRICLE PLAX 2D LVIDd:         3.10 cm  Diastology LVIDs:         1.80 cm  LV e' lateral:   11.10 cm/s LV PW:         0.90 cm  LV E/e' lateral: 6.1 LV IVS:        0.90 cm  LV e' medial:    9.36 cm/s LVOT diam:     1.75 cm  LV E/e' medial:  7.3 LV SV:         28 ml LV SV Index:   20.49 LVOT Area:     2.41 cm  RIGHT VENTRICLE TAPSE (M-mode): 1.8 cm LEFT ATRIUM           Index       RIGHT ATRIUM           Index LA diam:      3.50 cm 2.50 cm/m  RA Area:     13.00 cm LA Vol (A4C): 36.3 ml 25.88 ml/m RA Volume:   29.90 ml  21.32 ml/m  AORTIC VALVE AV Area (Vmax):    1.50 cm AV Area (Vmean):   1.80 cm AV Area (VTI):     1.62 cm AV Vmax:           234.00 cm/s AV Vmean:          155.000 cm/s AV VTI:            0.375 m AV Peak Grad:      21.9 mmHg AV Mean Grad:      11.0 mmHg LVOT  Vmax:         146.00 cm/s LVOT Vmean:        116.000 cm/s LVOT VTI:          0.252 m LVOT/AV VTI ratio: 0.67  AORTA Ao Root diam: 2.90 cm MITRAL VALVE MV Area (PHT): 4.21 cm             SHUNTS MV PHT:        52.20 msec           Systemic VTI:  0.25 m MV Decel Time: 180 msec             Systemic Diam: 1.75 cm MV E velocity: 68.10 cm/s 103 cm/s MV A velocity: 72.00 cm/s 70.3 cm/s MV E/A ratio:  0.95       1.5  Candee Furbish MD Electronically signed by Candee Furbish MD Signature Date/Time: 11/20/2019/3:53:34 PM    Final    [place] ASCITES (ABDOMEN LIMITED)  Result Date: 11/20/2019 CLINICAL DATA:  Hepatitis. EXAM: LIMITED ABDOMEN ULTRASOUND FOR ASCITES TECHNIQUE: Limited ultrasound survey for ascites was performed in all four abdominal quadrants. COMPARISON:  None. FINDINGS: No ascites.  There is a small right pleural effusion. IMPRESSION: 1. No ascites. 2. Right pleural  effusion. Electronically Signed   By: Dorise Bullion III M.D   On: 11/20/2019 13:38   VAS [place] LOWER EXTREMITY VENOUS (DVT)  Result Date: 11/20/2019  Lower Venous Study Indications: Elevated d dimer.  Limitations: Patient positioning. Comparison Study: no prior Performing Technologist: Abram Sander RVS  Examination Guidelines: A complete evaluation includes B-mode imaging, spectral Doppler, color Doppler, and power Doppler as needed of all accessible portions of each vessel. Bilateral testing is considered an integral part of a complete examination. Limited examinations for reoccurring indications may be performed as noted.  +---------+---------------+---------+-----------+----------+--------------+ RIGHT    CompressibilityPhasicitySpontaneityPropertiesThrombus Aging +---------+---------------+---------+-----------+----------+--------------+ CFV      Full           Yes      Yes                                 +---------+---------------+---------+-----------+----------+--------------+ SFJ      Full                                                        +---------+---------------+---------+-----------+----------+--------------+ FV Prox  Full                                                        +---------+---------------+---------+-----------+----------+--------------+ FV Mid   Full                                                        +---------+---------------+---------+-----------+----------+--------------+ FV DistalFull                                                        +---------+---------------+---------+-----------+----------+--------------+  PFV      Full                                                        +---------+---------------+---------+-----------+----------+--------------+ POP      Full           Yes      Yes                                 +---------+---------------+---------+-----------+----------+--------------+ PTV      Full                                                         +---------+---------------+---------+-----------+----------+--------------+ PERO                                                  Not visualized +---------+---------------+---------+-----------+----------+--------------+   +---------+---------------+---------+-----------+----------+--------------+ LEFT     CompressibilityPhasicitySpontaneityPropertiesThrombus Aging +---------+---------------+---------+-----------+----------+--------------+ CFV      Full           Yes      Yes                                 +---------+---------------+---------+-----------+----------+--------------+ SFJ      Full                                                        +---------+---------------+---------+-----------+----------+--------------+ FV Prox  Full                                                        +---------+---------------+---------+-----------+----------+--------------+ FV Mid   Full                                                        +---------+---------------+---------+-----------+----------+--------------+ FV DistalFull                                                        +---------+---------------+---------+-----------+----------+--------------+ PFV      Full                                                        +---------+---------------+---------+-----------+----------+--------------+  POP      Full           Yes      Yes                                 +---------+---------------+---------+-----------+----------+--------------+ PTV      Full                                                        +---------+---------------+---------+-----------+----------+--------------+ PERO     Full                                                        +---------+---------------+---------+-----------+----------+--------------+     Summary: Right: There is no evidence of deep vein thrombosis in  the lower extremity. No cystic structure found in the popliteal fossa. Left: There is no evidence of deep vein thrombosis in the lower extremity. No cystic structure found in the popliteal fossa.  *See table(s) above for measurements and observations. Electronically signed by Servando Snare MD on 11/20/2019 at 8:43:19 AM.    Final    Scheduled Meds: . pneumococcal 23 valent vaccine  0.5 mL Intramuscular Tomorrow-1000  . potassium chloride  40 mEq Oral BID   Continuous Infusions: . piperacillin-tazobactam (ZOSYN)  IV 3.375 g (11/20/19 1218)  . sodium bicarbonate 150 mEq in dextrose 5% 1000 mL 150 mEq (11/20/19 1020)    LOS: 4 days   Kerney Elbe, DO [facility] PAGER is on [place]  If 7PM-7AM, please contact night-coverage [email or web address]

## 2019-11-20 NOTE — Progress Notes (Signed)
  Echocardiogram 2D Echocardiogram has been performed.  [name] 11/20/2019, 3:33 PM

## 2019-11-20 NOTE — Progress Notes (Signed)
Pharmacy Antibiotic Note  [name] is a 86 y.o. female admitted on 11/16/2019 with intra-abdominal infection. Pharmacy has been consulted for Zosyn dosing. Patient was originally on vancomycin/cefepime for empiric therapy.  Fever resolved (Tm 99.8), WBC down slightly to 21.6, renal function stable with Scr 0.75 this AM. No growth on cultures.   Height: 5\' 2"  (157.5 cm) Weight: 96 lb 8 oz (43.8 kg) IBW/kg (Calculated) : 50.1  Temp (24hrs), Avg:99.1 F (37.3 C), Min:97.9 F (36.6 C), Max:99.8 F (37.7 C)  Recent Labs  Lab 11/16/19 1033 11/16/19 1400 11/16/19 1634 11/16/19 2101 11/17/19 0221 11/18/19 1020 11/18/19 1838 11/18/19 1915 11/18/19 2134 11/19/19 0046 11/20/19 0314  WBC   < >  --   --   --  19.7* 15.3*  --  25.6*  --  24.6* 21.6*  CREATININE   < >  --   --   --  1.04* 0.84  --  0.84  --  0.87 0.75  LATICACIDVEN  --  3.2* 3.6* 1.9  --   --  1.7  --  1.5  --   --    < > = values in this interval not displayed.    Estimated Creatinine Clearance: 35.5 mL/min (by C-G formula based on SCr of 0.75 mg/dL).    No Known Allergies  Cefepime 1/26 > 1/30 Vancomycin 1/26 > 1/30 Piptazo 1/26 x1, 1/30 >>  1/26 Bcx ngtd 1/26 UA neg 1/27 Ucx neg 1/26 Covid/flu neg  1/28 bld: ngtd  Plan:  - Stop vancomycin and cefepime - Start Zosyn 3.375 g IV Q8 hrs (4-hour infusion) - Monitor renal function, cultures, clinical progression  Richardine Service, PharmD PGY1 Pharmacy Resident Phone: [phone] 11/20/2019  11:37 AM  Please check [email or web address] for unit-specific pharmacy phone numbers.

## 2019-11-21 DIAGNOSIS — E43 Unspecified severe protein-calorie malnutrition: Secondary | ICD-10-CM

## 2019-11-21 DIAGNOSIS — K7460 Unspecified cirrhosis of liver: Secondary | ICD-10-CM

## 2019-11-21 DIAGNOSIS — K8689 Other specified diseases of pancreas: Secondary | ICD-10-CM

## 2019-11-21 DIAGNOSIS — R509 Fever, unspecified: Secondary | ICD-10-CM

## 2019-11-21 DIAGNOSIS — R109 Unspecified abdominal pain: Secondary | ICD-10-CM

## 2019-11-21 DIAGNOSIS — D72829 Elevated white blood cell count, unspecified: Secondary | ICD-10-CM

## 2019-11-21 DIAGNOSIS — B1920 Unspecified viral hepatitis C without hepatic coma: Secondary | ICD-10-CM

## 2019-11-21 DIAGNOSIS — D735 Infarction of spleen: Secondary | ICD-10-CM

## 2019-11-21 DIAGNOSIS — N183 Chronic kidney disease, stage 3 unspecified: Secondary | ICD-10-CM

## 2019-11-21 DIAGNOSIS — M069 Rheumatoid arthritis, unspecified: Secondary | ICD-10-CM

## 2019-11-21 LAB — BASIC METABOLIC PANEL
Anion gap: 11 (ref 5–15)
BUN: 9 mg/dL (ref 8–23)
CO2: 26 mmol/L (ref 22–32)
Calcium: 7.7 mg/dL — ABNORMAL LOW (ref 8.9–10.3)
Chloride: 100 mmol/L (ref 98–111)
Creatinine, Ser: 1.04 mg/dL — ABNORMAL HIGH (ref 0.44–1.00)
GFR calc Af Amer: 57 mL/min — ABNORMAL LOW (ref >60)
GFR calc non Af Amer: 49 mL/min — ABNORMAL LOW (ref >60)
Glucose, Bld: 98 mg/dL (ref 70–99)
Potassium: 3.8 mmol/L (ref 3.5–5.1)
Sodium: 137 mmol/L (ref 135–145)

## 2019-11-21 LAB — CBC WITH DIFFERENTIAL/PLATELET
Abs Immature Granulocytes: 0.64 10*3/uL — ABNORMAL HIGH (ref 0.00–0.07)
Basophils Absolute: 0.1 10*3/uL (ref 0.0–0.1)
Basophils Relative: 0 %
Eosinophils Absolute: 0.1 10*3/uL (ref 0.0–0.5)
Eosinophils Relative: 0 %
HCT: 31.1 % — ABNORMAL LOW (ref 36.0–46.0)
Hemoglobin: 10.2 g/dL — ABNORMAL LOW (ref 12.0–15.0)
Immature Granulocytes: 2 %
Lymphocytes Relative: 3 %
Lymphs Abs: 0.9 10*3/uL (ref 0.7–4.0)
MCH: 29.2 pg (ref 26.0–34.0)
MCHC: 32.8 g/dL (ref 30.0–36.0)
MCV: 89.1 fL (ref 80.0–100.0)
Monocytes Absolute: 2.5 10*3/uL — ABNORMAL HIGH (ref 0.1–1.0)
Monocytes Relative: 7 %
Neutro Abs: 32.6 10*3/uL — ABNORMAL HIGH (ref 1.7–7.7)
Neutrophils Relative %: 88 %
Platelets: 296 10*3/uL (ref 150–400)
RBC: 3.49 MIL/uL — ABNORMAL LOW (ref 3.87–5.11)
RDW: 13.8 % (ref 11.5–15.5)
WBC: 36.8 10*3/uL — ABNORMAL HIGH (ref 4.0–10.5)
nRBC: 0 % (ref 0.0–0.2)

## 2019-11-21 LAB — PHOSPHORUS
Phosphorus: 2.1 mg/dL — ABNORMAL LOW (ref 2.5–4.6)
Phosphorus: 2 mg/dL — ABNORMAL LOW (ref 2.5–4.6)

## 2019-11-21 LAB — CULTURE, BLOOD (ROUTINE X 2)
Culture: NO GROWTH
Culture: NO GROWTH
Special Requests: ADEQUATE
Special Requests: ADEQUATE

## 2019-11-21 LAB — HEPATIC FUNCTION PANEL
ALT: 13 U/L (ref 0–44)
AST: 35 U/L (ref 15–41)
Albumin: 1.6 g/dL — ABNORMAL LOW (ref 3.5–5.0)
Alkaline Phosphatase: 147 U/L — ABNORMAL HIGH (ref 38–126)
Bilirubin, Direct: 0.4 mg/dL — ABNORMAL HIGH (ref 0.0–0.2)
Indirect Bilirubin: 0.4 mg/dL (ref 0.3–0.9)
Total Bilirubin: 0.8 mg/dL (ref 0.3–1.2)
Total Protein: 5 g/dL — ABNORMAL LOW (ref 6.5–8.1)

## 2019-11-21 LAB — PROCALCITONIN: Procalcitonin: 70.13 ng/mL

## 2019-11-21 LAB — MAGNESIUM: Magnesium: 2 mg/dL (ref 1.7–2.4)

## 2019-11-21 MED ORDER — SODIUM CHLORIDE 0.9 % IV SOLN: INTRAVENOUS | Status: DC

## 2019-11-21 MED ORDER — K PHOS MONO-SOD PHOS DI & MONO 155-852-130 MG PO TABS
500.0000 mg | ORAL_TABLET | Freq: Two times a day (BID) | ORAL | Status: AC
  Administered 2019-11-21 (×2): 500 mg via ORAL
  Filled 2019-11-21 (×2): qty 2

## 2019-11-21 NOTE — Progress Notes (Signed)
PROGRESS NOTE    [name]  WHQ:[id number] DOB: January 14, 1932 DOA: 11/16/2019 PCP: Lucianne Lei, MD   Brief Narrative:  HPI per Dr. Dana Allan on 11/15/2018 Patient is an 86 year old female with past medical history significant for breast cancer, hypertension, hyperlipidemia, chronic abdominal pain, rheumatoid arthritis and chronic kidney disease stage IIIA/B.  Patient was seen by the GI provider earlier today for abdominal pain.  Apparently, patient has had abdominal pain for about 2 weeks.  Patient reported constant lower abdominal pain.  There is associated poor p.o. intake and poor appetite.  Patient reported significant weight loss.  On presentation to the GI provider's office, patient was found to be hypotensive, with systolic blood pressure in the 70s.  Patient was advised to come to the hospital for further assessment and management.  Lab work done at the hospital revealed CBC of 20.2, hemoglobin of 11.1, hematocrit of 34.3 and platelet count of 563.  Chemistry revealed sodium of 134, potassium of 5.4, chloride 102, CO2 of 19, BUN of 34 with serum creatinine of 1.3 from blood sugar of 136.  INR of 1.5 and lactic acid of 3.6 were reported as well.  D-dimer was elevated.  CTA of the chest and abdomen revealed lung emphysema, bilateral lower lung lobe atelectasis, large stable soft tissue mass around the tail of pancreas and numerous liver metastasis.  Evolving splenic infarct was also queried.  No headache, no neck, no chest pain, no shortness of breath, no no nausea vomiting or change in bowel habit.  Hospitalist team will admit patient for further assessment and management.  ED Course: On presentation to the emergency room, vitals revealed temperature of 97.3, blood pressure of 72/40 - 135/97 mmHg, heart rate of 64-1 8/min and respiratory rate of 1428.  O2 sat is 98% on room air.  Lab work is as documented above.  Patient has received vitamin K for elevated INR.  Patient has been  aggressively hydrated, pancultured and started on antibiotics.  The hospitalist team has been called to admit patient.  **Interim History  Patient had a repeat CT scan as above and IR was consulted for liver lesion biopsy given is the safest option and most efficacious way of determining source of her primary cancer given her multiple liver metastasis given that the GI had limited endoscopic options for diagnosis. Likely source of Cancer is Pancreatic Cancer vs. Recurrent Breast Cancer.  Oncology was consulted for further evaluation recommendations and she is still awaiting a biopsy.  Palliative care was also called for goals of care consultation  She has been spiking temperatures but was febrile overnight.  Blood cultures will obtained again and she is given acetaminophen.  WBC has elevated and procalcitonin is also elevated.  Biopsy has been canceled for now until she is more medically stable and likely will be done Monday.  Because her WBC count was elevated and her procalcitonin was worsening I discussed the case with infectious disease Dr. Baxter Flattery who recommends de-escalating antibiotics to IV Zosyn for a couple more days and obtaining an echocardiogram and abdominal ultrasound to look for further sources infection.  Dr. Graylon Good clinically feels that her symptoms are not attributable to an active infection but likely secondary to a malignancy and recommends liver biopsy Monday. Her hemoglobin/hematocrit is on the lower side.  Typed and screened and transfused 2 units PRBCs.  She continues to spike intermittent temperatures and continues to be tachycardic, tachypneic, and hypotensive.  Was restarted on IV fluids and infectious diseases was called  for further evaluation given her worsening WBC and procalcitonin level.  Assessment & Plan:   Active Problems:   Sepsis ([place])   Protein-calorie malnutrition, severe   Palliative care by specialist   Goals of care, counseling/discussion   Splenic mass    Liver metastases ([place])   Pancreatic mass  Sepsis/SIRS of Unclear etiology -Admit patient for further assessment and management. -Unclear etiology -She was hypotensive, tachycardic, tachypneic and had a leukocytosis and unclear source but she does have widespread disease; also admitted lactic acidosis on admission; she has intermittently been febrile -Procalcitonin was elevated and went from 2.90 is now significantly elevated to 70.13 Continue with empiric antibiotics with IV vancomycin and IV cefepime for now and will check MRSA PCR and de-escalate and stop vancomycin if negative -IV fluid hydration had stopped but now reinitiated 75 mils per hour -WBC remains elevated; initially was improving but it worsened and is now 36.8 -Continue to follow blood cultures and re-cultured the day before yesterday -Urinalysis was unremarkable but did show some rare bacteria  -Blood cultures and urine culture done and blood culture showed no growth to date at 5 days and urine culture showed no growth; Repeat Blood Cx no growth to date at 3 days -CT of the Chest/Abdomen/Pelvis showed "Moderate severity emphysematous lung disease. Mild left upper lobe and bilateral lower lobe atelectasis.  Large stable soft tissue mass along the region of the pancreatic tail and splenic hilum, consistent with primary versus metastatic neoplastic disease. Numerous stable liver lesions consistent with liver metastasis. Stable soft tissue masses within the mid to upper left abdomen and region of the splenic flexure consistent with additional neoplastic lesions. Stable findings likely consistent with an evolving splenic infarct. No evidence of a retroperitoneal hematoma in the presence of lower abdominal pain and hypotension."  -Patient complaining still complaining about abdominal pain so we will continue acetaminophen but will also add oxycodone IR -She was febrile again today so she will be given more acetaminophen -Continue to  monitor temperature curve and repeat CBC in a.m. -I spoke with infectious diseases Dr. Carlyle Basques who recommends de-escalating antibiotics to just IV Zosyn which I have done.  I have stopped the IV vancomycin and IV cefepime.  She recommends obtaining an echocardiogram to rule out possible valve vegetation and obtaining a abdominal ultrasound to look for her ascites given her history of hepatitis -Echocardiogram normal and ultrasound showed no ascites -Dr. Baxter Flattery clinically does not feel that the patient has an active infection and feels that symptoms are likely related to malignancy and recommends that we find the etiology with a biopsy of her primary cancer.  Because the patient worsen I asked Dr. Baxter Flattery to evaluate formally and she continues to recommend continuing Zosyn and following up on current cultures and feels at this time does not recommend further investigations other than a tissue sampling for pathology and would still want to discuss with oncology and IR to get a tissue diagnosis and help identify cause of intra-abdominal mass  Elevated D-dimer -D-Dimer was 3.14 -Had a CTA of the chest PE study which showed no Filling Defects -Will obtain a LE Venous Duplex and this was Negative for DVT bilaterally  -Echo done as below  Coagulopathy -Had an INR of 1.5 on Admission and given 5 mg po Vitamin K -Repeat INR this AM a few days ago was 1.3 -Repeat PT/INR in a.m.  Hepatitis C with cirrhosis and SVR -She is status post Harvoni in 2017 -Followed by a hepatologist at  [facility]  -previously was drinking 2 cans of beer daily but stopped -Checking ultrasound limited to evaluate for ascites and if she does have ascites will need a therapeutic and diagnostic paracentesis; no ascites noted on limited ultrasound  Widespread metastatic disease with liver lesions and anorexia, weight loss intermittent abdominal pain in the setting of pancreatic cancer versus recurrent breast cancer -Has a history of  Breast Cancer status post radiation, left breast mastectomy as well as Arimidex and follows with Dr. Lindi Adie -Patient will need tissue diagnosis for her metastatic carcinoma -Have a large pancreatic mass with liver metastasis which is concerning for metastatic pancreatic cancer however unfortunately until a biopsy is obtained cannot rule out metastatic breast cancer -Consult IR for liver biopsy and this did not happen yesterday but will hopefully happen today -Also consulted medical oncology Dr. Nicholas Lose and appreciate his insight and recc's: Dr. Quintella Reichert believes that if she does have metastatic pancreatic cancer she has an extremely poor prognosis and he would not offer her any systemic chemotherapy and recommend that hospice care would be indicated but if she does have metastatic breast cancer that he would consider systemic treatment options -Her AFP tumor marker was 5.7 and CA 19-9 was <3 -Likely contributing to the abdominal pain.  -Plan for consultants including IR and medical oncology and likely needs a biopsy at some point and hopefully this can be done Monday as this will be vital for the patient's diagnosis and treatment plan of care  Hypotension, stable -Patient has had poor p.o. intake -Blood pressure is on the lower side and today it was 90/46 earlier -Monitor closely -Hold Amlodipine for now and continue monitor blood pressures per protocol -Presented with a blood pressure 72/40 -TSH was normal   Lactic Acidosis -The setting of sepsis of unknown etiology and dehydration -Improved as lactic acid level from 3.2 is now 1.9 improved  AKI initially improved but now worsened again -In the setting of very poor p.o. intake and dehydration and hypotension -Patient's BUN/creatinine went from 34/1.34 to 9/0.75 and now is worsened to 9/1.04 -IV fluid hydration is resumed -Avoid nephrotoxic medications, contrast dyes, hypotension and renally adjust medications -Repeat CMP P in the  a.m.  Metabolic Acidosis -CO2 on admission was 19 and today it has dropped to 14; now CO2 is 26, chloride level is 100, anion gap is 11 -Given a total of 2 L fluid boluses and started on maintenance IV fluids at 125 mL's per hour and will drop to 100 mL/h and now changed to 75 mL's per hour of sodium bicarbonate; IV Sodium Bicarbonate Rate increased to 100 mL on 11/19/2019 and IV fluids have stopped yesterday but reinitiated again today -Continue to monitor and trend and repeat CMP in a.m.  Normocytic Anemia  -Patient's hemoglobin/hematocrit went from 11.1/34.3 and then dropped to 8.6/26.9 and now further dropped to 7.4/23.4 -> 7.1/22.8 -> 7.2/22.5; question if this could be a dilutional drop vs Bleeding -transfuse if hemoglobin falls less than 7; could be in the setting of chronic disease and malignancy -Check anemia panel but not done -Will Type and Screen and transfuse 2 units of pRBC's and repeat hemoglobin after this yesterday morning was 11.1/31.7 and today is 10.2/31.1 -Continue to monitor for symptoms of bleeding; currently no overt bleeding noted -Repeat CBC in a.m.  Hypomagnesemia -Patient's magnesium level 1.5 and improved to 2.3 -Replete with IV mag sulfate 3 g -continue monitor and replete as necessary -Repeat CMP in a.m.  Hypophosphatemia -Patient's phosphorus level was 2.1 -  Replete with IV K-Phos 10 mmol the day before yesterday and will replete with p.o. K-Phos neutral 5 mg p.o. twice daily -Continue to monitor and replete as necessary -Repeat phosphorus level in a.m.  Thrombocytosis  -Lately count on admission was 563 and then dropped to 450 now has normalized at 394 -> 387 -> 391 -> 346 -> 296; likely in the setting of her widespread metastatic disease and sepsis picture -Continue to monitor for signs and symptoms of bleeding -Repeat CBC in a.m.  Poor p.o. intake/Underweight/severe malnutrition in the context of chronic illness -Consult Dietitian for further  evaluation and recommendations  -Now that she is on a diet appreciate further recommendations  Hypokalemia -Patient's potassium this morning was 3.8 -Replete with p.o. potassium chloride 40 mEQ twice daily x2 doses yesterday -Continue to monitor and replete as necessary -Repeat CMP in a.m.  DVT prophylaxis: SCDs given that she will be going for Liver Biopsy  Code Status: FULL CODE   Family Communication: No family present at bedside  Disposition Plan: Patient came from: Home                                                                                                                          Anticipated d/c place: To be determined Barriers to d/c OR conditions which need to be met to effect a safe d/c: Further work-up with liver biopsy evaluation with oncology and IR as well as palliative care goals of care discussion as well as medical stability and treatment of her sepsis  Consultants:   Gastroenterology Dr. Tarri Glenn  Medical Oncology Dr. Lindi Adie  Interventional Radiology  Palliative Care Medicine   Infectious Diseases Dr. Carlyle Basques   Procedures: Liver Biopsy to be done LE Venous Duplex  ECHOCARDIOGRAM IMPRESSIONS    1. Left ventricular ejection fraction, by visual estimation, is 70 to  75%. The left ventricle has hyperdynamic function. There is no left  ventricular hypertrophy.  2. Left ventricular diastolic parameters are consistent with Grade I  diastolic dysfunction (impaired relaxation).  3. The left ventricle has no regional wall motion abnormalities.  4. Global right ventricle has normal systolic function.The right  ventricular size is normal. No increase in right ventricular wall  thickness.  5. Left atrial size was normal.  6. Right atrial size was normal.  7. The mitral valve is degenerative. No evidence of mitral valve  regurgitation. No evidence of mitral stenosis.  8. The tricuspid valve is normal in structure.  9. The tricuspid valve is  normal in structure. Tricuspid valve  regurgitation is mild.  10. Aortic valve mean gradient measures 11.0 mmHg.  11. Aortic valve peak gradient measures 21.9 mmHg.  12. The aortic valve is tricuspid. Aortic valve regurgitation is not  visualized. Mild aortic valve stenosis.  13. The pulmonic valve was normal in structure. Pulmonic valve  regurgitation is not visualized.  14. The inferior vena cava is normal in size with greater than 50%  respiratory variability, suggesting right  atrial pressure of 3 mmHg.   FINDINGS  Left Ventricle: Left ventricular ejection fraction, by visual estimation,  is 70 to 75%. The left ventricle has hyperdynamic function. The left  ventricle has no regional wall motion abnormalities. There is no left  ventricular hypertrophy. Left  ventricular diastolic parameters are consistent with Grade I diastolic  dysfunction (impaired relaxation). Normal left atrial pressure.   Right Ventricle: The right ventricular size is normal. No increase in  right ventricular wall thickness. Global RV systolic function is has  normal systolic function.   Left Atrium: Left atrial size was normal in size.   Right Atrium: Right atrial size was normal in size   Pericardium: There is no evidence of pericardial effusion.   Mitral Valve: The mitral valve is degenerative in appearance. There is  mild thickening of the mitral valve leaflet(s). No evidence of mitral  valve regurgitation. No evidence of mitral valve stenosis by observation.   Tricuspid Valve: The tricuspid valve is normal in structure. Tricuspid  valve regurgitation is mild.   Aortic Valve: The aortic valve is tricuspid. . There is moderate  thickening and mild calcification of the aortic valve. Aortic valve  regurgitation is not visualized. Mild aortic stenosis is present. There is  moderate thickening of the aortic valve. There  is mild calcification of the aortic valve. Aortic valve mean gradient  measures  11.0 mmHg. Aortic valve peak gradient measures 21.9 mmHg. Aortic  valve area, by VTI measures 1.62 cm.   Pulmonic Valve: The pulmonic valve was normal in structure. Pulmonic valve  regurgitation is not visualized. Pulmonic regurgitation is not visualized.   Aorta: The aortic root, ascending aorta and aortic arch are all  structurally normal, with no evidence of dilitation or obstruction.   Venous: The inferior vena cava is normal in size with greater than 50%  respiratory variability, suggesting right atrial pressure of 3 mmHg.   IAS/Shunts: No atrial level shunt detected by color flow Doppler. There is  no evidence of a patent foramen ovale. No ventricular septal defect is  seen or detected. There is no evidence of an atrial septal defect.     LEFT VENTRICLE  PLAX 2D  LVIDd:     3.10 cm Diastology  LVIDs:     1.80 cm LV e' lateral:  11.10 cm/s  LV PW:     0.90 cm LV E/e' lateral: 6.1  LV IVS:    0.90 cm LV e' medial:  9.36 cm/s  LVOT diam:   1.75 cm LV E/e' medial: 7.3  LV SV:     28 ml  LV SV Index:  20.49  LVOT Area:   2.41 cm     RIGHT VENTRICLE  TAPSE (M-mode): 1.8 cm   LEFT ATRIUM      Index    RIGHT ATRIUM      Index  LA diam:   3.50 cm 2.50 cm/m RA Area:   13.00 cm  LA Vol (A4C): 36.3 ml 25.88 ml/m RA Volume:  29.90 ml 21.32 ml/m  AORTIC VALVE  AV Area (Vmax):  1.50 cm  AV Area (Vmean):  1.80 cm  AV Area (VTI):   1.62 cm  AV Vmax:      234.00 cm/s  AV Vmean:     155.000 cm/s  AV VTI:      0.375 m  AV Peak Grad:   21.9 mmHg  AV Mean Grad:   11.0 mmHg  LVOT Vmax:     146.00 cm/s  LVOT Vmean:    116.000 cm/s  LVOT VTI:     0.252 m  LVOT/AV VTI ratio: 0.67    AORTA  Ao Root diam: 2.90 cm   MITRAL VALVE  MV Area (PHT): 4.21 cm       SHUNTS  MV PHT:    52.20 msec      Systemic VTI: 0.25 m  MV Decel Time: 180 msec       Systemic  Diam: 1.75 cm  MV E velocity: 68.10 cm/s 103 cm/s  MV A velocity: 72.00 cm/s 70.3 cm/s  MV E/A ratio: 0.95    1.5    Antimicrobials: Anti-infectives (From admission, onward)   Start     Dose/Rate Route Frequency Ordered Stop   11/20/19 1200  piperacillin-tazobactam (ZOSYN) IVPB 3.375 g     3.375 g 12.5 mL/hr over 240 Minutes Intravenous Every 8 hours 11/20/19 1134     11/16/19 2030  ceFEPIme (MAXIPIME) 2 g in sodium chloride 0.9 % 100 mL IVPB  Status:  Discontinued     2 g 200 mL/hr over 30 Minutes Intravenous Every 24 hours 11/16/19 2017 11/20/19 1130   11/16/19 2030  vancomycin (VANCOREADY) IVPB 750 mg/150 mL  Status:  Discontinued     750 mg 150 mL/hr over 60 Minutes Intravenous Every 48 hours 11/16/19 2017 11/20/19 1130   11/16/19 1500  piperacillin-tazobactam (ZOSYN) IVPB 3.375 g     3.375 g 100 mL/hr over 30 Minutes Intravenous  Once 11/16/19 1456 11/16/19 1637     Subjective: Seen and Examined at bedside and she was febrile last night and again today.  She denying current complaints allowing to go see her denies chest pain, shortness with nausea or vomiting or burning or discomfort in her urine.  States that her abdominal pain is not really there today.  No other concerns or complaints at this time but continues to spike temperatures and become tachycardic and tachypneic so infectious disease was consulted for further evaluation recommendations  Objective: Vitals:   11/21/19 1647 11/21/19 1651 11/21/19 1700 11/21/19 1800  BP: (!) 143/54  (!) 131/56 (!) 95/49  Pulse: (!) 121  (!) 121 (!) 42  Resp: 20  (!) 29 20  Temp: 100.2 F (37.9 C)     TempSrc: Oral     SpO2: 90% 93% 93% 97%  Weight:      Height:        Intake/Output Summary (Last 24 hours) at 11/21/2019 1836 Last data filed at 11/21/2019 1502 Gross per 24 hour  Intake 961.27 ml  Output 500 ml  Net 461.27 ml   Filed Weights   11/16/19 1244 11/16/19 2240  Weight: 44.2 kg 43.8 kg   Examination: Physical  Exam:  Constitutional: Thin elderly African-American female currently in no acute distress appears calm but slightly uncomfortable Eyes: Lids and conjunctivae normal, sclerae anicteric  ENMT: External Ears, Nose appear normal. Grossly normal hearing. Neck: Appears normal, supple, no cervical masses, normal ROM, no appreciable thyromegaly; no JVD Respiratory: Diminished to auscultation bilaterally, no wheezing, rales, rhonchi or crackles. Normal respiratory effort and patient is not tachypenic. Wearing supplemental O2 via [place] Cardiovascular: RRR, no murmurs / rubs / gallops. S1 and S2 auscultated. Trace extremity edema.  Abdomen: Soft, non-tender, non-distended. Bowel sounds positive x4.  GU: Deferred. Musculoskeletal: No clubbing / cyanosis of digits/nails. No joint deformity upper and lower extremities. Skin: No rashes, lesions, ulcers on a limited skin evaluation. No induration; Warm and dry.  Neurologic: CN 2-12 grossly intact  with no focal deficits. Romberg sign cerebellar reflexes not assessed.  Psychiatric: Normal judgment and insight. Alert and oriented x 3. Normal mood and appropriate affect.   Data Reviewed: I have personally reviewed following labs and imaging studies  CBC: Recent Labs  Lab 11/18/19 1020 11/18/19 1020 11/18/19 1915 11/19/19 0046 11/19/19 2135 11/20/19 0314 11/21/19 0941  WBC 15.3*  --  25.6* 24.6*  --  21.6* 36.8*  NEUTROABS 11.6*  --  23.3* 21.4*  --  17.7* 32.6*  HGB 7.4*   < > 7.1* 7.2* 12.1 11.1* 10.2*  HCT 23.4*   < > 22.8* 22.5* 35.6* 31.7* 31.1*  MCV 89.7  --  91.6 89.3  --  84.5 89.1  PLT 394  --  387 391  --  346 296   < > = values in this interval not displayed.   Basic Metabolic Panel: Recent Labs  Lab 11/18/19 1020 11/18/19 1020 11/18/19 1915 11/19/19 0046 11/20/19 0314 11/21/19 0321 11/21/19 0941  NA 138  --  138 138 136 137  --   K 3.6  --  3.8 4.0 3.1* 3.8  --   CL 115*  --  112* 111 101 100  --   CO2 17*  --  14* 14* 24 26  --    GLUCOSE 94  --  96 62* 105* 98  --   BUN 13  --  '12 12 9 9  ' --   CREATININE 0.84  --  0.84 0.87 0.75 1.04*  --   CALCIUM 8.2*  --  8.1* 8.1* 7.7* 7.7*  --   MG 1.7  --  1.4* 1.5* 2.3  --  2.0  PHOS 2.6   < > 2.4* 2.4* 2.5 2.1* 2.0*   < > = values in this interval not displayed.   GFR: Estimated Creatinine Clearance: 27.3 mL/min (A) (by C-G formula based on SCr of 1.04 mg/dL (H)). Liver Function Tests: Recent Labs  Lab 11/18/19 1020 11/18/19 1915 11/19/19 0046 11/20/19 0314 11/21/19 1350  AST '20 21 24 20 ' 35  ALT '12 13 11 13 13  ' ALKPHOS 131* 133* 134* 124 147*  BILITOT 0.4 0.9 0.9 0.9 0.8  PROT 5.5* 5.6* 5.6* 5.4* 5.0*  ALBUMIN 1.9* 2.0* 2.0* 1.8* 1.6*   No results for input(s): LIPASE, AMYLASE in the last 168 hours. No results for input(s): AMMONIA in the last 168 hours. Coagulation Profile: Recent Labs  Lab 11/16/19 1033 11/18/19 1222  INR 1.5* 1.3*   Cardiac Enzymes: No results for input(s): CKTOTAL, CKMB, CKMBINDEX, TROPONINI in the last 168 hours. BNP (last 3 results) No results for input(s): PROBNP in the last 8760 hours. HbA1C: No results for input(s): HGBA1C in the last 72 hours. CBG: Recent Labs  Lab 11/19/19 0241  GLUCAP 142*   Lipid Profile: No results for input(s): CHOL, HDL, LDLCALC, TRIG, CHOLHDL, LDLDIRECT in the last 72 hours. Thyroid Function Tests: No results for input(s): TSH, T4TOTAL, FREET4, T3FREE, THYROIDAB in the last 72 hours. Anemia Panel: No results for input(s): VITAMINB12, FOLATE, FERRITIN, TIBC, IRON, RETICCTPCT in the last 72 hours. Sepsis Labs: Recent Labs  Lab 11/16/19 1634 11/16/19 2101 11/18/19 1838 11/18/19 1915 11/18/19 2134 11/19/19 0046 11/20/19 0314 11/21/19 0941  PROCALCITON  --   --   --  2.90  --  3.04 13.84 70.13  LATICACIDVEN 3.6* 1.9 1.7  --  1.5  --   --   --     Recent Results (from the past 240 hour(s))  SARS  CORONAVIRUS 2 (TAT 6-24 HRS) Nasopharyngeal Nasopharyngeal Swab     Status: None    Collection Time: 11/16/19  1:32 PM   Specimen: Nasopharyngeal Swab  Result Value Ref Range Status   SARS Coronavirus 2 NEGATIVE NEGATIVE Final    Comment: (NOTE) SARS-CoV-2 target nucleic acids are NOT DETECTED. The SARS-CoV-2 RNA is generally detectable in upper and lower respiratory specimens during the acute phase of infection. Negative results do not preclude SARS-CoV-2 infection, do not rule out co-infections with other pathogens, and should not be used as the sole basis for treatment or other patient management decisions. Negative results must be combined with clinical observations, patient history, and epidemiological information. The expected result is Negative. Fact Sheet for Patients: SugarRoll[employer]be Fact Sheet for Healthcare Providers: https://www.woods-mathews.com/ This test is not yet approved or cleared by the [place] FDA and  has been authorized for detection and/or diagnosis of SARS-CoV-2 by FDA under an Emergency Use Authorization (EUA). This EUA will remain  in effect (meaning this test can be used) for the duration of the COVID-19 declaration under Section 56 4(b)(1) of the Act, 21 U.S.C. section 360bbb-3(b)(1), unless the authorization is terminated or revoked sooner. Performed at [facility], [place] [address] [place], [place] [address]   Culture, blood (routine x 2)     Status: None   Collection Time: 11/16/19  2:11 PM   Specimen: BLOOD  Result Value Ref Range Status   Specimen Description BLOOD RIGHT ANTECUBITAL  Final   Special Requests   Final    BOTTLES DRAWN AEROBIC AND ANAEROBIC Blood Culture adequate volume   Culture   Final    NO GROWTH 5 DAYS Performed at [facility], [place] [address], [place], [place] [address]    Report Status 11/21/2019 FINAL  Final  Culture, blood (routine x 2)     Status: None   Collection Time: 11/16/19  2:26 PM   Specimen: BLOOD  Result Value Ref Range Status    Specimen Description BLOOD RIGHT ANTECUBITAL  Final   Special Requests   Final    BOTTLES DRAWN AEROBIC AND ANAEROBIC Blood Culture adequate volume   Culture   Final    NO GROWTH 5 DAYS Performed at [facility], [place] [address] [place], [place] [address]    Report Status 11/21/2019 FINAL  Final  Urine culture     Status: None   Collection Time: 11/17/19  5:16 AM   Specimen: Urine, Random  Result Value Ref Range Status   Specimen Description URINE, RANDOM  Final   Special Requests NONE  Final   Culture   Final    NO GROWTH Performed at [facility], [place] [address], [place], [place] [address]    Report Status 11/17/2019 FINAL  Final  MRSA PCR Screening     Status: None   Collection Time: 11/18/19  6:00 PM   Specimen: Nasal Mucosa; Nasopharyngeal  Result Value Ref Range Status   MRSA by PCR NEGATIVE NEGATIVE Final    Comment:        The GeneXpert MRSA Assay (FDA approved for NASAL specimens only), is one component of a comprehensive MRSA colonization surveillance program. It is not intended to diagnose MRSA infection nor to guide or monitor treatment for MRSA infections. Performed at [facility], [place] [address] [place], [place] [address]   Culture, blood (routine x 2)     Status: None (Preliminary result)   Collection Time: 11/18/19  6:38 PM   Specimen: BLOOD RIGHT WRIST  Result Value Ref Range Status   Specimen Description BLOOD RIGHT WRIST  Final   Special Requests   Final    BOTTLES DRAWN AEROBIC AND ANAEROBIC Blood Culture results may not be optimal due to an inadequate volume of blood received in culture bottles   Culture   Final    NO GROWTH 3 DAYS Performed at [facility], [place] [address] [place], [place] [address]    Report Status PENDING  Incomplete  Culture, blood (routine x 2)     Status: None (Preliminary result)   Collection Time: 11/18/19  6:42 PM   Specimen: BLOOD RIGHT WRIST  Result Value Ref Range Status   Specimen  Description BLOOD RIGHT WRIST  Final   Special Requests   Final    BOTTLES DRAWN AEROBIC AND ANAEROBIC Blood Culture adequate volume   Culture   Final    NO GROWTH 3 DAYS Performed at [facility], [place] [address] [place], [place] [address]    Report Status PENDING  Incomplete    Radiology Studies: DG Abd Portable 1V  Result Date: 11/20/2019 CLINICAL DATA:  Abdominal pain. EXAM: PORTABLE ABDOMEN - 1 VIEW COMPARISON:  CT abdomen pelvis 11/16/2019 FINDINGS: Small left pleural effusion and underlying consolidation. Small amount of gas within nondilated loops of bowel throughout the abdomen. Supine evaluation limited for the detection of free intraperitoneal air. Aortic atherosclerosis. IMPRESSION: Nonobstructed bowel gas pattern. Small left pleural effusion and underlying consolidation. Electronically Signed   By: Lovey Newcomer M.D.   On: 11/20/2019 07:38   ECHOCARDIOGRAM COMPLETE  Result Date: 11/20/2019   ECHOCARDIOGRAM REPORT   Patient Name:   [name] Date of Exam: 11/20/2019 Medical Rec #:  [id number]            Height:       62.0 in Accession #:    [id number]           Weight:       96.5 lb Date of Birth:  [birth year]            BSA:          1.40 m Patient Age:    28 years             BP:           122/60 mmHg Patient Gender: F                    HR:           96 bpm. Exam Location:  Inpatient Procedure: 2D Echo Indications:    fever 780.6  History:        Patient has prior history of Echocardiogram examinations, most                 recent 03/08/2015. Risk Factors:Hypertension and Former Smoker.  Sonographer:    Jannett Celestine RDCS (AE) Referring Phys: [id number] Georgina Quint LATIF [facility]  Sonographer Comments: restricted mobiliy. off axis apical window IMPRESSIONS  1. Left ventricular ejection fraction, by visual estimation, is 70 to 75%. The left ventricle has hyperdynamic function. There is no left ventricular hypertrophy.  2. Left ventricular diastolic parameters are consistent with Grade I  diastolic dysfunction (impaired relaxation).  3. The left ventricle has no regional wall motion abnormalities.  4. Global right ventricle has normal systolic function.The right ventricular size is normal. No increase in right ventricular wall thickness.  5. Left atrial  size was normal.  6. Right atrial size was normal.  7. The mitral valve is degenerative. No evidence of mitral valve regurgitation. No evidence of mitral stenosis.  8. The tricuspid valve is normal in structure.  9. The tricuspid valve is normal in structure. Tricuspid valve regurgitation is mild. 10. Aortic valve mean gradient measures 11.0 mmHg. 11. Aortic valve peak gradient measures 21.9 mmHg. 12. The aortic valve is tricuspid. Aortic valve regurgitation is not visualized. Mild aortic valve stenosis. 13. The pulmonic valve was normal in structure. Pulmonic valve regurgitation is not visualized. 14. The inferior vena cava is normal in size with greater than 50% respiratory variability, suggesting right atrial pressure of 3 mmHg. FINDINGS  Left Ventricle: Left ventricular ejection fraction, by visual estimation, is 70 to 75%. The left ventricle has hyperdynamic function. The left ventricle has no regional wall motion abnormalities. There is no left ventricular hypertrophy. Left ventricular diastolic parameters are consistent with Grade I diastolic dysfunction (impaired relaxation). Normal left atrial pressure. Right Ventricle: The right ventricular size is normal. No increase in right ventricular wall thickness. Global RV systolic function is has normal systolic function. Left Atrium: Left atrial size was normal in size. Right Atrium: Right atrial size was normal in size Pericardium: There is no evidence of pericardial effusion. Mitral Valve: The mitral valve is degenerative in appearance. There is mild thickening of the mitral valve leaflet(s). No evidence of mitral valve regurgitation. No evidence of mitral valve stenosis by observation. Tricuspid  Valve: The tricuspid valve is normal in structure. Tricuspid valve regurgitation is mild. Aortic Valve: The aortic valve is tricuspid. . There is moderate thickening and mild calcification of the aortic valve. Aortic valve regurgitation is not visualized. Mild aortic stenosis is present. There is moderate thickening of the aortic valve. There  is mild calcification of the aortic valve. Aortic valve mean gradient measures 11.0 mmHg. Aortic valve peak gradient measures 21.9 mmHg. Aortic valve area, by VTI measures 1.62 cm. Pulmonic Valve: The pulmonic valve was normal in structure. Pulmonic valve regurgitation is not visualized. Pulmonic regurgitation is not visualized. Aorta: The aortic root, ascending aorta and aortic arch are all structurally normal, with no evidence of dilitation or obstruction. Venous: The inferior vena cava is normal in size with greater than 50% respiratory variability, suggesting right atrial pressure of 3 mmHg. IAS/Shunts: No atrial level shunt detected by color flow Doppler. There is no evidence of a patent foramen ovale. No ventricular septal defect is seen or detected. There is no evidence of an atrial septal defect.  LEFT VENTRICLE PLAX 2D LVIDd:         3.10 cm  Diastology LVIDs:         1.80 cm  LV e' lateral:   11.10 cm/s LV PW:         0.90 cm  LV E/e' lateral: 6.1 LV IVS:        0.90 cm  LV e' medial:    9.36 cm/s LVOT diam:     1.75 cm  LV E/e' medial:  7.3 LV SV:         28 ml LV SV Index:   20.49 LVOT Area:     2.41 cm  RIGHT VENTRICLE TAPSE (M-mode): 1.8 cm LEFT ATRIUM           Index       RIGHT ATRIUM           Index LA diam:      3.50 cm 2.50 cm/m  RA Area:     13.00 cm LA Vol (A4C): 36.3 ml 25.88 ml/m RA Volume:   29.90 ml  21.32 ml/m  AORTIC VALVE AV Area (Vmax):    1.50 cm AV Area (Vmean):   1.80 cm AV Area (VTI):     1.62 cm AV Vmax:           234.00 cm/s AV Vmean:          155.000 cm/s AV VTI:            0.375 m AV Peak Grad:      21.9 mmHg AV Mean Grad:       11.0 mmHg LVOT Vmax:         146.00 cm/s LVOT Vmean:        116.000 cm/s LVOT VTI:          0.252 m LVOT/AV VTI ratio: 0.67  AORTA Ao Root diam: 2.90 cm MITRAL VALVE MV Area (PHT): 4.21 cm             SHUNTS MV PHT:        52.20 msec           Systemic VTI:  0.25 m MV Decel Time: 180 msec             Systemic Diam: 1.75 cm MV E velocity: 68.10 cm/s 103 cm/s MV A velocity: 72.00 cm/s 70.3 cm/s MV E/A ratio:  0.95       1.5  Candee Furbish MD Electronically signed by Candee Furbish MD Signature Date/Time: 11/20/2019/3:53:34 PM    Final    [place] ASCITES (ABDOMEN LIMITED)  Result Date: 11/20/2019 CLINICAL DATA:  Hepatitis. EXAM: LIMITED ABDOMEN ULTRASOUND FOR ASCITES TECHNIQUE: Limited ultrasound survey for ascites was performed in all four abdominal quadrants. COMPARISON:  None. FINDINGS: No ascites.  There is a small right pleural effusion. IMPRESSION: 1. No ascites. 2. Right pleural effusion. Electronically Signed   By: Dorise Bullion III M.D   On: 11/20/2019 13:38   Scheduled Meds:  phosphorus  500 mg Oral BID   Continuous Infusions:  sodium chloride 75 mL/hr at 11/21/19 1044   piperacillin-tazobactam (ZOSYN)  IV 3.375 g (11/21/19 1420)    LOS: 5 days   Kerney Elbe, DO [facility] PAGER is on [place]  If 7PM-7AM, please contact night-coverage [email or web address]

## 2019-11-21 NOTE — Consult Note (Signed)
[facility]ious [facility]  Total days of antibiotics 6               Reason for Consult:leukocytosis    Referring Physician: sheikh  Active Problems:   Sepsis ([place])   Protein-calorie malnutrition, severe   Palliative care by specialist   Goals of care, counseling/discussion   Splenic mass   Liver metastases ([place])   Pancreatic mass    HPI: [name] is a 86 y.o. female  With hisotryof RA, CKD 3, hepatitis C with cirrhosis, hx of breast ca s/p lumpectomy, radiation, and antiestrogen therapy in 2013. She is now admitted for 2 wk history of poor intake, abdominal pain and weight loss. On admit, she had WBC ok 19K, imaging that showed large tissue mass about tail of pancreas with numerous liver mets and splenic infarct, concerning for metastatic pancreatic cancer. She was to get IR biopsy but then had high temp of 101F with WBc now up to 24k. abtx at that time were vancomycin and cefepime which was narrowed to piptazo on 1/30 but she still having fever up to 103F up and her labs this morning up tok 36K with elevated procalcitonin. Dr Alfredia Ferguson is concerned about untreated infections vs underlying malignancy. Patient reports having chills but not subjective fevers, denies nightsweats, no cough and little abdominal pain now.   Past Medical History:  Diagnosis Date  . Abdominal pain 04/2016  . Anemia of chronic disease   . Anxiety   . Breast cancer ([place]) 08/28/12   IDC left breast bx=invasive ductal ca,ER/PR=+  . Breast cancer ([place]) 09/01/2012  . Hepatitis   . Hypertension   . Lipid disorder   . RA (rheumatoid arthritis) ([place])   . Radiation 11/12/12-12/15/11   Left Breast/50 Gy  . Renal insufficiency   . Wears dentures    top  . Wears glasses     Allergies: No Known Allergies  MEDICATIONS: . phosphorus  500 mg Oral BID    Social History   Tobacco Use  . Smoking status: Former Smoker    Packs/day: 1.00    Types: Cigarettes    Quit date: 10/27/2007   Years since quitting: 12.0  . Smokeless tobacco: Never Used  . Tobacco comment: started smoking age 21  Substance Use Topics  . Alcohol use: Not Currently    Comment: rarely  . Drug use: No    Family History  Problem Relation Age of Onset  . Cancer Father   . Prostate cancer Father   . Tuberculosis Mother   . Breast cancer Cousin     Review of Systems -    Constitutional: + chills. Negative for fever,  diaphoresis, activity change, appetite change, fatigue and unexpected weight change.  HENT: Negative for congestion, sore throat, rhinorrhea, sneezing, trouble swallowing and sinus pressure.  Eyes: Negative for photophobia and visual disturbance.  Respiratory: Negative for cough, chest tightness, shortness of breath, wheezing and stridor.  Cardiovascular: Negative for chest pain, palpitations and leg swelling.  Gastrointestinal: Negative for nausea, vomiting, abdominal pain, diarrhea, constipation, blood in stool, abdominal distention and anal bleeding.  Genitourinary: Negative for dysuria, hematuria, flank pain and difficulty urinating.  Musculoskeletal: Negative for myalgias, back pain, joint swelling, arthralgias and gait problem.  Skin: Negative for color change, pallor, rash and wound.  Neurological: Negative for dizziness, tremors, weakness and light-headedness.  Hematological: Negative for adenopathy. Does not bruise/bleed easily.  Psychiatric/Behavioral: Negative for behavioral problems, confusion, sleep disturbance, dysphoric mood, decreased concentration and  agitation.    OBJECTIVE: Temp:  [97.5 F (36.4 C)-103 F (39.4 C)] 97.6 F (36.4 C) (01/31 1218) Pulse Rate:  [79-132] 80 (01/31 1218) Resp:  [13-27] 14 (01/31 1218) BP: (90-166)/(42-78) 90/46 (01/31 1218) SpO2:  [90 %-96 %] 93 % (01/31 1218) Physical Exam  Constitutional:  oriented to person, place, and time. appears her stated age and well-nourished. No distress. Watching tv comfortably in bed  HENT: [place]/AT,  PERRLA, no scleral icterus Mouth/Throat: Oropharynx is clear and moist. No oropharyngeal exudate.  Cardiovascular: Normal rate, regular rhythm and normal heart sounds. Exam reveals no gallop and no friction rub.  No murmur heard.  Pulmonary/Chest: Effort normal and breath sounds normal. No respiratory distress.  has no wheezes.  Neck = supple, no nuchal rigidity Abdominal: Soft. Bowel sounds are normal.  exhibits no distension. There is no tenderness.  Lymphadenopathy: no cervical adenopathy. No axillary adenopathy Neurological: alert and oriented to person, place, and time.  Skin: Skin is warm and dry. No rash noted. No erythema.  Psychiatric: a normal mood and affect.  behavior is normal.    LABS: Results for orders placed or performed during the hospital encounter of 11/16/19 (from the past 48 hour(s))  Hemoglobin and hematocrit, blood     Status: Abnormal   Collection Time: 11/19/19  9:35 PM  Result Value Ref Range   Hemoglobin 12.1 12.0 - 15.0 g/dL    Comment: REPEATED TO VERIFY POST TRANSFUSION SPECIMEN    HCT 35.6 (L) 36.0 - 46.0 %    Comment: Performed at [facility] [address] [place], [place] [address]  Procalcitonin     Status: None   Collection Time: 11/20/19  3:14 AM  Result Value Ref Range   Procalcitonin 13.84 ng/mL    Comment:        Interpretation: PCT >= 10 ng/mL: Important systemic inflammatory response, almost exclusively due to severe bacterial sepsis or septic shock. (NOTE)       Sepsis PCT Algorithm           Lower Respiratory Tract                                      Infection PCT Algorithm    ----------------------------     ----------------------------         PCT < 0.25 ng/mL                PCT < 0.10 ng/mL         Strongly encourage             Strongly discourage   discontinuation of antibiotics    initiation of antibiotics    ----------------------------     -----------------------------       PCT 0.25 - 0.50 ng/mL            PCT  0.10 - 0.25 ng/mL               OR       >80% decrease in PCT            Discourage initiation of                                            antibiotics      Encourage discontinuation  of antibiotics    ----------------------------     -----------------------------         PCT >= 0.50 ng/mL              PCT 0.26 - 0.50 ng/mL                AND       <80% decrease in PCT             Encourage initiation of                                             antibiotics       Encourage continuation           of antibiotics    ----------------------------     -----------------------------        PCT >= 0.50 ng/mL                  PCT > 0.50 ng/mL               AND         increase in PCT                  Strongly encourage                                      initiation of antibiotics    Strongly encourage escalation           of antibiotics                                     -----------------------------                                           PCT <= 0.25 ng/mL                                                 OR                                        > 80% decrease in PCT                                     Discontinue / Do not initiate                                             antibiotics Performed at [facility], [address], [place], [place] [address]   Comprehensive metabolic panel     Status: Abnormal   Collection Time: 11/20/19  3:14 AM  Result Value Ref Range   Sodium 136 135 - 145 mmol/L  Potassium 3.1 (L) 3.5 - 5.1 mmol/L   Chloride 101 98 - 111 mmol/L   CO2 24 22 - 32 mmol/L   Glucose, Bld 105 (H) 70 - 99 mg/dL   BUN 9 8 - 23 mg/dL   Creatinine, Ser 0.75 0.44 - 1.00 mg/dL   Calcium 7.7 (L) 8.9 - 10.3 mg/dL   Total Protein 5.4 (L) 6.5 - 8.1 g/dL   Albumin 1.8 (L) 3.5 - 5.0 g/dL   AST 20 15 - 41 U/L   ALT 13 0 - 44 U/L   Alkaline Phosphatase 124 38 - 126 U/L   Total Bilirubin 0.9 0.3 - 1.2 mg/dL   GFR calc non Af Amer >60 >60 mL/min   GFR calc Af  Amer >60 >60 mL/min   Anion gap 11 5 - 15    Comment: Performed at [facility] [address] [place], [place] [address]  CBC with Differential/Platelet     Status: Abnormal   Collection Time: 11/20/19  3:14 AM  Result Value Ref Range   WBC 21.6 (H) 4.0 - 10.5 K/uL   RBC 3.75 (L) 3.87 - 5.11 MIL/uL   Hemoglobin 11.1 (L) 12.0 - 15.0 g/dL   HCT 31.7 (L) 36.0 - 46.0 %   MCV 84.5 80.0 - 100.0 fL   MCH 29.6 26.0 - 34.0 pg   MCHC 35.0 30.0 - 36.0 g/dL   RDW 13.1 11.5 - 15.5 %   Platelets 346 150 - 400 K/uL   nRBC 0.1 0.0 - 0.2 %   Neutrophils Relative % 82 %   Neutro Abs 17.7 (H) 1.7 - 7.7 K/uL   Lymphocytes Relative 7 %   Lymphs Abs 1.4 0.7 - 4.0 K/uL   Monocytes Relative 9 %   Monocytes Absolute 2.0 (H) 0.1 - 1.0 K/uL   Eosinophils Relative 1 %   Eosinophils Absolute 0.2 0.0 - 0.5 K/uL   Basophils Relative 0 %   Basophils Absolute 0.1 0.0 - 0.1 K/uL   Immature Granulocytes 1 %   Abs Immature Granulocytes 0.21 (H) 0.00 - 0.07 K/uL    Comment: Performed at [facility] [address] [place], [place] [address]  Phosphorus     Status: None   Collection Time: 11/20/19  3:14 AM  Result Value Ref Range   Phosphorus 2.5 2.5 - 4.6 mg/dL    Comment: Performed at [facility] [address] [place], [place] [address]  Magnesium     Status: None   Collection Time: 11/20/19  3:14 AM  Result Value Ref Range   Magnesium 2.3 1.7 - 2.4 mg/dL    Comment: Performed at [facility] [address] [place], [place] [id number]  Basic metabolic panel     Status: Abnormal   Collection Time: 11/21/19  3:21 AM  Result Value Ref Range   Sodium 137 135 - 145 mmol/L   Potassium 3.8 3.5 - 5.1 mmol/L   Chloride 100 98 - 111 mmol/L   CO2 26 22 - 32 mmol/L   Glucose, Bld 98 70 - 99 mg/dL   BUN 9 8 - 23 mg/dL   Creatinine, Ser 1.04 (H) 0.44 - 1.00 mg/dL   Calcium 7.7 (L) 8.9 - 10.3 mg/dL   GFR calc non Af Amer 49 (L) >60 mL/min   GFR calc Af Amer 57 (L) >60 mL/min    Anion gap 11 5 - 15    Comment: Performed at [facility], [address] [place], [place] [address]  Phosphorus     Status: Abnormal   Collection Time: 11/21/19  3:21 AM  Result Value Ref Range   Phosphorus 2.1 (L) 2.5 - 4.6 mg/dL    Comment: Performed at [facility] [address] [place], [place] [address]  CBC with Differential/Platelet     Status: Abnormal   Collection Time: 11/21/19  9:41 AM  Result Value Ref Range   WBC 36.8 (H) 4.0 - 10.5 K/uL   RBC 3.49 (L) 3.87 - 5.11 MIL/uL   Hemoglobin 10.2 (L) 12.0 - 15.0 g/dL   HCT 31.1 (L) 36.0 - 46.0 %   MCV 89.1 80.0 - 100.0 fL   MCH 29.2 26.0 - 34.0 pg   MCHC 32.8 30.0 - 36.0 g/dL   RDW 13.8 11.5 - 15.5 %   Platelets 296 150 - 400 K/uL   nRBC 0.0 0.0 - 0.2 %   Neutrophils Relative % 88 %   Neutro Abs 32.6 (H) 1.7 - 7.7 K/uL   Lymphocytes Relative 3 %   Lymphs Abs 0.9 0.7 - 4.0 K/uL   Monocytes Relative 7 %   Monocytes Absolute 2.5 (H) 0.1 - 1.0 K/uL   Eosinophils Relative 0 %   Eosinophils Absolute 0.1 0.0 - 0.5 K/uL   Basophils Relative 0 %   Basophils Absolute 0.1 0.0 - 0.1 K/uL   RBC Morphology MORPHOLOGY UNREMARKABLE    Immature Granulocytes 2 %   Abs Immature Granulocytes 0.64 (H) 0.00 - 0.07 K/uL    Comment: Performed at [facility], [place] [address], [place], [place] [address]  Magnesium     Status: None   Collection Time: 11/21/19  9:41 AM  Result Value Ref Range   Magnesium 2.0 1.7 - 2.4 mg/dL    Comment: Performed at [facility] [address] [place], [place] [address]  Phosphorus     Status: Abnormal   Collection Time: 11/21/19  9:41 AM  Result Value Ref Range   Phosphorus 2.0 (L) 2.5 - 4.6 mg/dL    Comment: Performed at [facility] [address] [place],  [address]  Procalcitonin - Baseline     Status: None   Collection Time: 11/21/19  9:41 AM  Result Value Ref Range   Procalcitonin 70.13 ng/mL    Comment:        Interpretation: PCT >= 10 ng/mL: Important  systemic inflammatory response, almost exclusively due to severe bacterial sepsis or septic shock. (NOTE)       Sepsis PCT Algorithm           Lower Respiratory Tract                                      Infection PCT Algorithm    ----------------------------     ----------------------------         PCT < 0.25 ng/mL                PCT < 0.10 ng/mL         Strongly encourage             Strongly discourage   discontinuation of antibiotics    initiation of antibiotics    ----------------------------     -----------------------------       PCT 0.25 - 0.50 ng/mL  PCT 0.10 - 0.25 ng/mL               OR       >80% decrease in PCT            Discourage initiation of                                            antibiotics      Encourage discontinuation           of antibiotics    ----------------------------     -----------------------------         PCT >= 0.50 ng/mL              PCT 0.26 - 0.50 ng/mL                AND       <80% decrease in PCT             Encourage initiation of                                             antibiotics       Encourage continuation           of antibiotics    ----------------------------     -----------------------------        PCT >= 0.50 ng/mL                  PCT > 0.50 ng/mL               AND         increase in PCT                  Strongly encourage                                      initiation of antibiotics    Strongly encourage escalation           of antibiotics                                     -----------------------------                                           PCT <= 0.25 ng/mL                                                 OR                                        > 80% decrease in PCT  Discontinue / Do not initiate                                             antibiotics Performed at [facility], [place] [address], [place], [place] [address]     MICRO:  IMAGING: DG Abd Portable  1V  Result Date: 11/20/2019 CLINICAL DATA:  Abdominal pain. EXAM: PORTABLE ABDOMEN - 1 VIEW COMPARISON:  CT abdomen pelvis 11/16/2019 FINDINGS: Small left pleural effusion and underlying consolidation. Small amount of gas within nondilated loops of bowel throughout the abdomen. Supine evaluation limited for the detection of free intraperitoneal air. Aortic atherosclerosis. IMPRESSION: Nonobstructed bowel gas pattern. Small left pleural effusion and underlying consolidation. Electronically Signed   By: Lovey Newcomer M.D.   On: 11/20/2019 07:38   ECHOCARDIOGRAM COMPLETE  Result Date: 11/20/2019   ECHOCARDIOGRAM REPORT   Patient Name:   DARIELA WINKLEPLECK Date of Exam: 11/20/2019 Medical Rec #:  [id number]            Height:       62.0 in Accession #:    [id number]           Weight:       96.5 lb Date of Birth:  1932-07-01            BSA:          1.40 m Patient Age:    53 years             BP:           122/60 mmHg Patient Gender: F                    HR:           96 bpm. Exam Location:  Inpatient Procedure: 2D Echo Indications:    fever 780.6  History:        Patient has prior history of Echocardiogram examinations, most                 recent 03/08/2015. Risk Factors:Hypertension and Former Smoker.  Sonographer:    Jannett Celestine RDCS (AE) Referring Phys: [id number] Georgina Quint LATIF [facility]  Sonographer Comments: restricted mobiliy. off axis apical window IMPRESSIONS  1. Left ventricular ejection fraction, by visual estimation, is 70 to 75%. The left ventricle has hyperdynamic function. There is no left ventricular hypertrophy.  2. Left ventricular diastolic parameters are consistent with Grade I diastolic dysfunction (impaired relaxation).  3. The left ventricle has no regional wall motion abnormalities.  4. Global right ventricle has normal systolic function.The right ventricular size is normal. No increase in right ventricular wall thickness.  5. Left atrial size was normal.  6. Right atrial size was normal.  7. The  mitral valve is degenerative. No evidence of mitral valve regurgitation. No evidence of mitral stenosis.  8. The tricuspid valve is normal in structure.  9. The tricuspid valve is normal in structure. Tricuspid valve regurgitation is mild. 10. Aortic valve mean gradient measures 11.0 mmHg. 11. Aortic valve peak gradient measures 21.9 mmHg. 12. The aortic valve is tricuspid. Aortic valve regurgitation is not visualized. Mild aortic valve stenosis. 13. The pulmonic valve was normal in structure. Pulmonic valve regurgitation is not visualized. 14. The inferior vena cava is normal in size with greater than 50% respiratory variability, suggesting right atrial pressure of 3 mmHg. FINDINGS  Left Ventricle: Left ventricular ejection fraction, by visual estimation, is 70 to 75%. The left ventricle has hyperdynamic function. The left ventricle has no regional wall motion abnormalities. There is no left ventricular hypertrophy. Left ventricular diastolic parameters are consistent with Grade I diastolic dysfunction (impaired relaxation). Normal left atrial pressure. Right Ventricle: The right ventricular size is normal. No increase in right ventricular wall thickness. Global RV systolic function is has normal systolic function. Left Atrium: Left atrial size was normal in size. Right Atrium: Right atrial size was normal in size Pericardium: There is no evidence of pericardial effusion. Mitral Valve: The mitral valve is degenerative in appearance. There is mild thickening of the mitral valve leaflet(s). No evidence of mitral valve regurgitation. No evidence of mitral valve stenosis by observation. Tricuspid Valve: The tricuspid valve is normal in structure. Tricuspid valve regurgitation is mild. Aortic Valve: The aortic valve is tricuspid. . There is moderate thickening and mild calcification of the aortic valve. Aortic valve regurgitation is not visualized. Mild aortic stenosis is present. There is moderate thickening of the  aortic valve. There  is mild calcification of the aortic valve. Aortic valve mean gradient measures 11.0 mmHg. Aortic valve peak gradient measures 21.9 mmHg. Aortic valve area, by VTI measures 1.62 cm. Pulmonic Valve: The pulmonic valve was normal in structure. Pulmonic valve regurgitation is not visualized. Pulmonic regurgitation is not visualized. Aorta: The aortic root, ascending aorta and aortic arch are all structurally normal, with no evidence of dilitation or obstruction. Venous: The inferior vena cava is normal in size with greater than 50% respiratory variability, suggesting right atrial pressure of 3 mmHg. IAS/Shunts: No atrial level shunt detected by color flow Doppler. There is no evidence of a patent foramen ovale. No ventricular septal defect is seen or detected. There is no evidence of an atrial septal defect.  LEFT VENTRICLE PLAX 2D LVIDd:         3.10 cm  Diastology LVIDs:         1.80 cm  LV e' lateral:   11.10 cm/s LV PW:         0.90 cm  LV E/e' lateral: 6.1 LV IVS:        0.90 cm  LV e' medial:    9.36 cm/s LVOT diam:     1.75 cm  LV E/e' medial:  7.3 LV SV:         28 ml LV SV Index:   20.49 LVOT Area:     2.41 cm  RIGHT VENTRICLE TAPSE (M-mode): 1.8 cm LEFT ATRIUM           Index       RIGHT ATRIUM           Index LA diam:      3.50 cm 2.50 cm/m  RA Area:     13.00 cm LA Vol (A4C): 36.3 ml 25.88 ml/m RA Volume:   29.90 ml  21.32 ml/m  AORTIC VALVE AV Area (Vmax):    1.50 cm AV Area (Vmean):   1.80 cm AV Area (VTI):     1.62 cm AV Vmax:           234.00 cm/s AV Vmean:          155.000 cm/s AV VTI:            0.375 m AV Peak Grad:      21.9 mmHg AV Mean Grad:      11.0 mmHg LVOT Vmax:  146.00 cm/s LVOT Vmean:        116.000 cm/s LVOT VTI:          0.252 m LVOT/AV VTI ratio: 0.67  AORTA Ao Root diam: 2.90 cm MITRAL VALVE MV Area (PHT): 4.21 cm             SHUNTS MV PHT:        52.20 msec           Systemic VTI:  0.25 m MV Decel Time: 180 msec             Systemic Diam: 1.75 cm MV  E velocity: 68.10 cm/s 103 cm/s MV A velocity: 72.00 cm/s 70.3 cm/s MV E/A ratio:  0.95       1.5  Candee Furbish MD Electronically signed by Candee Furbish MD Signature Date/Time: 11/20/2019/3:53:34 PM    Final    [place] ASCITES (ABDOMEN LIMITED)  Result Date: 11/20/2019 CLINICAL DATA:  Hepatitis. EXAM: LIMITED ABDOMEN ULTRASOUND FOR ASCITES TECHNIQUE: Limited ultrasound survey for ascites was performed in all four abdominal quadrants. COMPARISON:  None. FINDINGS: No ascites.  There is a small right pleural effusion. IMPRESSION: 1. No ascites. 2. Right pleural effusion. Electronically Signed   By: Dorise Bullion III M.D   On: 11/20/2019 13:38    Assessment/Plan:  86yo F with pancreatic and hepatic lesions concerning for pancreatic ca with mestastic disease. Malignancy have overlap with infection with pyrexia, leukocytosis and even elevated procalcitonin. No new sources found either on blood, cxr, nor ascites fluid  - for now recommend to continue piptazo, and follow up on current cultures - at this time, I do not recommend further investigations- other than tissue for path - would still discuss with onc and IR to get tissue diagnosis to help identify cause of intra-abdominal mass  Fever = recommend to treat with tylenol to help with symptoms  Dr Tommy Medal to see tomorrow

## 2019-11-21 NOTE — Progress Notes (Signed)
   Palliative Medicine Inpatient Follow Up Note   HPI: 86 y.o. female with past medical history of breast cancer 2013 s/p lumpectomy, radiation, and antiestrogen therapy, hypertension, hyperlipidemia, chronic abdominal pain, RA, CKD stage III, hepatitis C and liver cirrhosis admitted on 11/16/2019 with abdominal pain x2 weeks, poor oral intake, and weight loss. CTA of the chest/abdomen revealed lung emphysema, bilateral lower lung lobe atelectasis, large stable soft tissue mass around the tail of pancreas and numerous liver metastasis with evidence of splenic infarct. IR performed liver biopsy which is pending. Oncology following. Per Dr. Lindi Adie, CTA concerning for metastatic pancreatic cancer (which would indicate extremely poor prognosis and would not offer systemic chemotherapy) or other primary, possibly metastatic breast cancer. Awaiting results of biopsy. Palliative medicine consultation for goals of care.   Today's Discussion (11/21/2019): Chart reviewed. Met with [name] at bedside. She asked the "plan for the day." I shared that it is my understanding that she is going to get a liver biopsy in the oncoming day or two. She stated that she understood that.   I asked her how she was feeling overall, she stated that she has a poor appetite but otherwise denied pain, nausea, or dyspnea.   Discussed with patient the importance of continued conversation with family and their medical providers regarding overall plan of care and treatment options, ensuring decisions are within the context of the patients values and GOCs.  Questions and concerns addressed   Vital Signs Vitals:   11/21/19 0900 11/21/19 1000  BP:  (!) 97/47  Pulse:  81  Resp:  19  Temp:    SpO2: 96%     Intake/Output Summary (Last 24 hours) at 11/21/2019 1210 Last data filed at 11/21/2019 1100 Gross per 24 hour  Intake 971.11 ml  Output 650 ml  Net 321.11 ml   Last Weight  Most recent update: 11/16/2019 10:42 PM   Weight  43.8 kg (96 lb 8 oz)          Physical Exam Vitals and nursing note reviewed.  Constitutional:      General: She is awake.     Appearance: She is a cachectic AA F HENT:     Head: Normocephalic and atraumatic.  Cardiovascular:     Rate and Rhythm: Normal rate.  Pulmonary:     Effort: No tachypnea, accessory muscle usage or respiratory distress.     Comments: 2L Abdominal:     Tenderness: There is no abdominal tenderness.  Skin:    General: Skin is warm.  Neurological:     Mental Status: She is alert and oriented to person, place, and time.   SUMMARY OF RECOMMENDATIONS    Pending liver biopsy and further oncology recommendations when biopsy has resulted.   Will need further GOC and code status discussions pending biopsy/oncolgy input.   Time Spent: 15 Greater than 50% of the time was spent in counseling and coordination of care ______________________________________________________________________________________ [facility] Team Team Cell Phone: [phone] Please utilize secure chat with additional questions, if there is no response within 30 minutes please call the above phone number  Palliative Medicine Team providers are available by phone from 7am to 7pm daily and can be reached through the team cell phone.  Should this patient require assistance outside of these hours, please call the patient's attending physician.

## 2019-11-22 ENCOUNTER — Other Ambulatory Visit: Payer: Self-pay

## 2019-11-22 ENCOUNTER — Inpatient Hospital Stay (HOSPITAL_COMMUNITY): Payer: Medicare Other

## 2019-11-22 DIAGNOSIS — C50919 Malignant neoplasm of unspecified site of unspecified female breast: Secondary | ICD-10-CM

## 2019-11-22 DIAGNOSIS — R161 Splenomegaly, not elsewhere classified: Secondary | ICD-10-CM

## 2019-11-22 DIAGNOSIS — D72823 Leukemoid reaction: Secondary | ICD-10-CM

## 2019-11-22 DIAGNOSIS — C7889 Secondary malignant neoplasm of other digestive organs: Secondary | ICD-10-CM

## 2019-11-22 DIAGNOSIS — R634 Abnormal weight loss: Secondary | ICD-10-CM

## 2019-11-22 LAB — COMPREHENSIVE METABOLIC PANEL
ALT: 12 U/L (ref 0–44)
AST: 43 U/L — ABNORMAL HIGH (ref 15–41)
Albumin: 1.6 g/dL — ABNORMAL LOW (ref 3.5–5.0)
Alkaline Phosphatase: 193 U/L — ABNORMAL HIGH (ref 38–126)
Anion gap: 16 — ABNORMAL HIGH (ref 5–15)
BUN: 9 mg/dL (ref 8–23)
CO2: 22 mmol/L (ref 22–32)
Calcium: 8.1 mg/dL — ABNORMAL LOW (ref 8.9–10.3)
Chloride: 104 mmol/L (ref 98–111)
Creatinine, Ser: 1.11 mg/dL — ABNORMAL HIGH (ref 0.44–1.00)
GFR calc Af Amer: 52 mL/min — ABNORMAL LOW (ref >60)
GFR calc non Af Amer: 45 mL/min — ABNORMAL LOW (ref >60)
Glucose, Bld: 77 mg/dL (ref 70–99)
Potassium: 3.2 mmol/L — ABNORMAL LOW (ref 3.5–5.1)
Sodium: 142 mmol/L (ref 135–145)
Total Bilirubin: 1.6 mg/dL — ABNORMAL HIGH (ref 0.3–1.2)
Total Protein: 5.2 g/dL — ABNORMAL LOW (ref 6.5–8.1)

## 2019-11-22 LAB — CBC WITH DIFFERENTIAL/PLATELET
Abs Immature Granulocytes: 1.44 10*3/uL — ABNORMAL HIGH (ref 0.00–0.07)
Basophils Absolute: 0.1 10*3/uL (ref 0.0–0.1)
Basophils Relative: 0 %
Eosinophils Absolute: 0.1 10*3/uL (ref 0.0–0.5)
Eosinophils Relative: 0 %
HCT: 33.1 % — ABNORMAL LOW (ref 36.0–46.0)
Hemoglobin: 10.7 g/dL — ABNORMAL LOW (ref 12.0–15.0)
Immature Granulocytes: 4 %
Lymphocytes Relative: 1 %
Lymphs Abs: 0.5 10*3/uL — ABNORMAL LOW (ref 0.7–4.0)
MCH: 29 pg (ref 26.0–34.0)
MCHC: 32.3 g/dL (ref 30.0–36.0)
MCV: 89.7 fL (ref 80.0–100.0)
Monocytes Absolute: 1.9 10*3/uL — ABNORMAL HIGH (ref 0.1–1.0)
Monocytes Relative: 5 %
Neutro Abs: 36.6 10*3/uL — ABNORMAL HIGH (ref 1.7–7.7)
Neutrophils Relative %: 90 %
Platelets: 313 10*3/uL (ref 150–400)
RBC: 3.69 MIL/uL — ABNORMAL LOW (ref 3.87–5.11)
RDW: 14.3 % (ref 11.5–15.5)
WBC: 40.6 10*3/uL — ABNORMAL HIGH (ref 4.0–10.5)
nRBC: 0 % (ref 0.0–0.2)

## 2019-11-22 LAB — PHOSPHORUS: Phosphorus: 2.9 mg/dL (ref 2.5–4.6)

## 2019-11-22 LAB — PROCALCITONIN: Procalcitonin: 57.68 ng/mL

## 2019-11-22 LAB — MAGNESIUM: Magnesium: 1.8 mg/dL (ref 1.7–2.4)

## 2019-11-22 MED ORDER — MIDAZOLAM HCL 2 MG/2ML IJ SOLN
INTRAMUSCULAR | Status: AC | PRN
  Administered 2019-11-22: .25 mg via INTRAVENOUS

## 2019-11-22 MED ORDER — LIDOCAINE HCL (PF) 1 % IJ SOLN
INTRAMUSCULAR | Status: AC
  Filled 2019-11-22: qty 30

## 2019-11-22 MED ORDER — FENTANYL CITRATE (PF) 100 MCG/2ML IJ SOLN
INTRAMUSCULAR | Status: AC
  Filled 2019-11-22: qty 2

## 2019-11-22 MED ORDER — MIDAZOLAM HCL 2 MG/2ML IJ SOLN
INTRAMUSCULAR | Status: AC
  Filled 2019-11-22: qty 2

## 2019-11-22 MED ORDER — IOHEXOL 300 MG/ML  SOLN
95.0000 mL | Freq: Once | INTRAMUSCULAR | Status: AC | PRN
  Administered 2019-11-22: 95 mL via INTRAVENOUS

## 2019-11-22 MED ORDER — POTASSIUM CHLORIDE CRYS ER 20 MEQ PO TBCR
40.0000 meq | EXTENDED_RELEASE_TABLET | Freq: Two times a day (BID) | ORAL | Status: AC
  Administered 2019-11-22 (×2): 40 meq via ORAL
  Filled 2019-11-22 (×2): qty 2

## 2019-11-22 MED ORDER — GELATIN ABSORBABLE 12-7 MM EX MISC
CUTANEOUS | Status: AC
  Filled 2019-11-22: qty 1

## 2019-11-22 NOTE — Progress Notes (Signed)
Pt temperature was elevate 103.2, patient received 650 mg tylenol which bring the temperature to 101.3 Pt complained about being cold, and had diarrhea.

## 2019-11-22 NOTE — Sedation Documentation (Signed)
Patient is resting comfortably. 

## 2019-11-22 NOTE — Procedures (Signed)
Interventional Radiology Procedure Note  Procedure: US guided core biopsy of liver lesion.   Complications: None.  Estimated Blood Loss: None.   Recommendations: - Bedrest x 2 hrs.    Signed,  Heath K. McCullough, MD    

## 2019-11-22 NOTE — Sedation Documentation (Signed)
Biopsies taken at this time, pt tolerating procedure very well.

## 2019-11-22 NOTE — Sedation Documentation (Signed)
Vital signs stable. 

## 2019-11-22 NOTE — Sedation Documentation (Signed)
Pt is awake, alert and oriented x4. Consent signed and pt is NPO. Awaiting MD. VSS

## 2019-11-22 NOTE — Progress Notes (Addendum)
Subjective: No new complaints   Antibiotics:  Anti-infectives (From admission, onward)   Start     Dose/Rate Route Frequency Ordered Stop   11/20/19 1200  piperacillin-tazobactam (ZOSYN) IVPB 3.375 g     3.375 g 12.5 mL/hr over 240 Minutes Intravenous Every 8 hours 11/20/19 1134     11/16/19 2030  ceFEPIme (MAXIPIME) 2 g in sodium chloride 0.9 % 100 mL IVPB  Status:  Discontinued     2 g 200 mL/hr over 30 Minutes Intravenous Every 24 hours 11/16/19 2017 11/20/19 1130   11/16/19 2030  vancomycin (VANCOREADY) IVPB 750 mg/150 mL  Status:  Discontinued     750 mg 150 mL/hr over 60 Minutes Intravenous Every 48 hours 11/16/19 2017 11/20/19 1130   11/16/19 1500  piperacillin-tazobactam (ZOSYN) IVPB 3.375 g     3.375 g 100 mL/hr over 30 Minutes Intravenous  Once 11/16/19 1456 11/16/19 1637      Medications: Scheduled Meds: . potassium chloride  40 mEq Oral BID   Continuous Infusions: . piperacillin-tazobactam (ZOSYN)  IV 3.375 g (11/22/19 0611)   PRN Meds:.acetaminophen **OR** acetaminophen, oxyCODONE    Objective: Weight change:   Intake/Output Summary (Last 24 hours) at 11/22/2019 1354 Last data filed at 11/22/2019 0700 Gross per 24 hour  Intake 1569.96 ml  Output --  Net 1569.96 ml   Blood pressure (!) 106/46, pulse 95, temperature 98.6 F (37 C), temperature source Oral, resp. rate (!) 23, height 5\' 2"  (1.575 m), weight 43.8 kg, SpO2 93 %. Temp:  [98.6 F (37 C)-102.8 F (39.3 C)] 98.6 F (37 C) (02/01 1114) Pulse Rate:  [95-121] 95 (02/01 1114) Resp:  [20-29] 23 (02/01 1114) BP: (91-143)/(41-56) 106/46 (02/01 1114) SpO2:  [88 %-97 %] 93 % (02/01 1114)  Physical Exam: General: Alert and awake, fatigued and frail appearing. HEENT:EOMI CVS tachycardic regular rate, normal  Chest: , no wheezing, no respiratory distress Abdomen: soft non-distended,  Skin: no rashes Neuro: nonfocal  CBC:    BMET Recent Labs    11/21/19 0321 11/22/19 0650  NA  137 142  K 3.8 3.2*  CL 100 104  CO2 26 22  GLUCOSE 98 77  BUN 9 9  CREATININE 1.04* 1.11*  CALCIUM 7.7* 8.1*     Liver Panel  Recent Labs    11/21/19 1350 11/22/19 0650  PROT 5.0* 5.2*  ALBUMIN 1.6* 1.6*  AST 35 43*  ALT 13 12  ALKPHOS 147* 193*  BILITOT 0.8 1.6*  BILIDIR 0.4*  --   IBILI 0.4  --        Sedimentation Rate No results for input(s): ESRSEDRATE in the last 72 hours. C-Reactive Protein No results for input(s): CRP in the last 72 hours.  Micro Results: Recent Results (from the past 720 hour(s))  SARS CORONAVIRUS 2 (TAT 6-24 HRS) Nasopharyngeal Nasopharyngeal Swab     Status: None   Collection Time: 11/16/19  1:32 PM   Specimen: Nasopharyngeal Swab  Result Value Ref Range Status   SARS Coronavirus 2 NEGATIVE NEGATIVE Final    Comment: (NOTE) SARS-CoV-2 target nucleic acids are NOT DETECTED. The SARS-CoV-2 RNA is generally detectable in upper and lower respiratory specimens during the acute phase of infection. Negative results do not preclude SARS-CoV-2 infection, do not rule out co-infections with other pathogens, and should not be used as the sole basis for treatment or other patient management decisions. Negative results must be combined with clinical observations, patient history, and epidemiological information. The expected  result is Negative. Fact Sheet for Patients: SugarRoll.be Fact Sheet for Healthcare Providers: https://www.woods-mathews.com/ This test is not yet approved or cleared by the [place] FDA and  has been authorized for detection and/or diagnosis of SARS-CoV-2 by FDA under an Emergency Use Authorization (EUA). This EUA will remain  in effect (meaning this test can be used) for the duration of the COVID-19 declaration under Section 56 4(b)(1) of the Act, 21 U.S.C. section 360bbb-3(b)(1), unless the authorization is terminated or revoked sooner. Performed at [facility], [place] [address], [place], [place] [address]   Culture, blood (routine x 2)     Status: None   Collection Time: 11/16/19  2:11 PM   Specimen: BLOOD  Result Value Ref Range Status   Specimen Description BLOOD RIGHT ANTECUBITAL  Final   Special Requests   Final    BOTTLES DRAWN AEROBIC AND ANAEROBIC Blood Culture adequate volume   Culture   Final    NO GROWTH 5 DAYS Performed at [facility], [place] [address] [place], [place] [address]    Report Status 11/21/2019 FINAL  Final  Culture, blood (routine x 2)     Status: None   Collection Time: 11/16/19  2:26 PM   Specimen: BLOOD  Result Value Ref Range Status   Specimen Description BLOOD RIGHT ANTECUBITAL  Final   Special Requests   Final    BOTTLES DRAWN AEROBIC AND ANAEROBIC Blood Culture adequate volume   Culture   Final    NO GROWTH 5 DAYS Performed at [facility], [place] [address], [place], [place] [address]    Report Status 11/21/2019 FINAL  Final  Urine culture     Status: None   Collection Time: 11/17/19  5:16 AM   Specimen: Urine, Random  Result Value Ref Range Status   Specimen Description URINE, RANDOM  Final   Special Requests NONE  Final   Culture   Final    NO GROWTH Performed at [facility], [place] [address], [place], [place] [address]    Report Status 11/17/2019 FINAL  Final  MRSA PCR Screening     Status: None   Collection Time: 11/18/19  6:00 PM   Specimen: Nasal Mucosa; Nasopharyngeal  Result Value Ref Range Status   MRSA by PCR NEGATIVE NEGATIVE Final    Comment:        The GeneXpert MRSA Assay (FDA approved for NASAL specimens only), is one component of a comprehensive MRSA colonization surveillance program. It is not intended to diagnose MRSA infection nor to guide or monitor treatment for MRSA infections. Performed at [facility], [place] [address], [place], [place] [address]   Culture, blood (routine x 2)     Status: None (Preliminary result)   Collection Time: 11/18/19  6:38  PM   Specimen: BLOOD RIGHT WRIST  Result Value Ref Range Status   Specimen Description BLOOD RIGHT WRIST  Final   Special Requests   Final    BOTTLES DRAWN AEROBIC AND ANAEROBIC Blood Culture results may not be optimal due to an inadequate volume of blood received in culture bottles   Culture   Final    NO GROWTH 4 DAYS Performed at [facility], [place] [address] [place], [place] [address]    Report Status PENDING  Incomplete  Culture, blood (routine x 2)     Status: None (Preliminary result)   Collection Time: 11/18/19  6:42 PM   Specimen: BLOOD RIGHT  WRIST  Result Value Ref Range Status   Specimen Description BLOOD RIGHT WRIST  Final   Special Requests   Final    BOTTLES DRAWN AEROBIC AND ANAEROBIC Blood Culture adequate volume   Culture   Final    NO GROWTH 4 DAYS Performed at [facility], [address], [place], [place] [address]    Report Status PENDING  Incomplete    Studies/Results: DG CHEST PORT 1 VIEW  Result Date: 11/22/2019 CLINICAL DATA:  Dyspnea, chest pain and cough. EXAM: PORTABLE CHEST 1 VIEW COMPARISON:  Chest x-ray 11/18/2019 FINDINGS: The cardiac silhouette, mediastinal and hilar contours are stable. There is extensive calcification of the thoracic aorta. Underlying significant chronic lung disease with emphysema and pulmonary scarring. Suspect superimposed new interstitial edema with thickened inter septal lines (Kerley B-lines), left pleural effusion and bibasilar atelectasis. IMPRESSION: Chronic underlying lung disease with superimposed interstitial edema, left pleural effusion and bibasilar atelectasis. Electronically Signed   By: Marijo Sanes M.D.   On: 11/22/2019 07:18   ECHOCARDIOGRAM COMPLETE  Result Date: 11/20/2019   ECHOCARDIOGRAM REPORT   Patient Name:   [name] Date of Exam: 11/20/2019 Medical Rec #:  [id number]            Height:       62.0 in Accession #:    [id number]           Weight:       96.5 lb Date of Birth:  1932/03/21             BSA:          1.40 m Patient Age:    86 years             BP:           122/60 mmHg Patient Gender: F                    HR:           96 bpm. Exam Location:  Inpatient Procedure: 2D Echo Indications:    fever 780.6  History:        Patient has prior history of Echocardiogram examinations, most                 recent 03/08/2015. Risk Factors:Hypertension and Former Smoker.  Sonographer:    Jannett Celestine RDCS (AE) Referring Phys: [id number] Georgina Quint LATIF [facility]  Sonographer Comments: restricted mobiliy. off axis apical window IMPRESSIONS  1. Left ventricular ejection fraction, by visual estimation, is 70 to 75%. The left ventricle has hyperdynamic function. There is no left ventricular hypertrophy.  2. Left ventricular diastolic parameters are consistent with Grade I diastolic dysfunction (impaired relaxation).  3. The left ventricle has no regional wall motion abnormalities.  4. Global right ventricle has normal systolic function.The right ventricular size is normal. No increase in right ventricular wall thickness.  5. Left atrial size was normal.  6. Right atrial size was normal.  7. The mitral valve is degenerative. No evidence of mitral valve regurgitation. No evidence of mitral stenosis.  8. The tricuspid valve is normal in structure.  9. The tricuspid valve is normal in structure. Tricuspid valve regurgitation is mild. 10. Aortic valve mean gradient measures 11.0 mmHg. 11. Aortic valve peak gradient measures 21.9 mmHg. 12. The aortic valve is tricuspid. Aortic valve regurgitation is not visualized. Mild aortic valve stenosis. 13. The pulmonic valve was normal in structure. Pulmonic valve regurgitation is not visualized. 14. The inferior  vena cava is normal in size with greater than 50% respiratory variability, suggesting right atrial pressure of 3 mmHg. FINDINGS  Left Ventricle: Left ventricular ejection fraction, by visual estimation, is 70 to 75%. The left ventricle has hyperdynamic function. The left  ventricle has no regional wall motion abnormalities. There is no left ventricular hypertrophy. Left ventricular diastolic parameters are consistent with Grade I diastolic dysfunction (impaired relaxation). Normal left atrial pressure. Right Ventricle: The right ventricular size is normal. No increase in right ventricular wall thickness. Global RV systolic function is has normal systolic function. Left Atrium: Left atrial size was normal in size. Right Atrium: Right atrial size was normal in size Pericardium: There is no evidence of pericardial effusion. Mitral Valve: The mitral valve is degenerative in appearance. There is mild thickening of the mitral valve leaflet(s). No evidence of mitral valve regurgitation. No evidence of mitral valve stenosis by observation. Tricuspid Valve: The tricuspid valve is normal in structure. Tricuspid valve regurgitation is mild. Aortic Valve: The aortic valve is tricuspid. . There is moderate thickening and mild calcification of the aortic valve. Aortic valve regurgitation is not visualized. Mild aortic stenosis is present. There is moderate thickening of the aortic valve. There  is mild calcification of the aortic valve. Aortic valve mean gradient measures 11.0 mmHg. Aortic valve peak gradient measures 21.9 mmHg. Aortic valve area, by VTI measures 1.62 cm. Pulmonic Valve: The pulmonic valve was normal in structure. Pulmonic valve regurgitation is not visualized. Pulmonic regurgitation is not visualized. Aorta: The aortic root, ascending aorta and aortic arch are all structurally normal, with no evidence of dilitation or obstruction. Venous: The inferior vena cava is normal in size with greater than 50% respiratory variability, suggesting right atrial pressure of 3 mmHg. IAS/Shunts: No atrial level shunt detected by color flow Doppler. There is no evidence of a patent foramen ovale. No ventricular septal defect is seen or detected. There is no evidence of an atrial septal defect.   LEFT VENTRICLE PLAX 2D LVIDd:         3.10 cm  Diastology LVIDs:         1.80 cm  LV e' lateral:   11.10 cm/s LV PW:         0.90 cm  LV E/e' lateral: 6.1 LV IVS:        0.90 cm  LV e' medial:    9.36 cm/s LVOT diam:     1.75 cm  LV E/e' medial:  7.3 LV SV:         28 ml LV SV Index:   20.49 LVOT Area:     2.41 cm  RIGHT VENTRICLE TAPSE (M-mode): 1.8 cm LEFT ATRIUM           Index       RIGHT ATRIUM           Index LA diam:      3.50 cm 2.50 cm/m  RA Area:     13.00 cm LA Vol (A4C): 36.3 ml 25.88 ml/m RA Volume:   29.90 ml  21.32 ml/m  AORTIC VALVE AV Area (Vmax):    1.50 cm AV Area (Vmean):   1.80 cm AV Area (VTI):     1.62 cm AV Vmax:           234.00 cm/s AV Vmean:          155.000 cm/s AV VTI:            0.375 m AV Peak Grad:  21.9 mmHg AV Mean Grad:      11.0 mmHg LVOT Vmax:         146.00 cm/s LVOT Vmean:        116.000 cm/s LVOT VTI:          0.252 m LVOT/AV VTI ratio: 0.67  AORTA Ao Root diam: 2.90 cm MITRAL VALVE MV Area (PHT): 4.21 cm             SHUNTS MV PHT:        52.20 msec           Systemic VTI:  0.25 m MV Decel Time: 180 msec             Systemic Diam: 1.75 cm MV E velocity: 68.10 cm/s 103 cm/s MV A velocity: 72.00 cm/s 70.3 cm/s MV E/A ratio:  0.95       1.5  Candee Furbish MD Electronically signed by Candee Furbish MD Signature Date/Time: 11/20/2019/3:53:34 PM    Final       Assessment/Plan:  INTERVAL HISTORY: Patient's fever and leukemoid reaction worsening   Active Problems:   Sepsis ([place])   Protein-calorie malnutrition, severe   Palliative care by specialist   Goals of care, counseling/discussion   Splenic mass   Liver metastases [facility])   Pancreatic mass    [name] is a 86 y.o. female with history of breast cancer, hepatitis C with cirrhosis, now admitted with a 2-week history of abdominal pain and weight loss.  Imaging has shown numerous lesions distant with metastases in the liver as well as the pancreas and spleen all concerning for potential  pancreatic cancer.  She was supposed to get a biopsy by interventional radiology but this is now been canceled due to ongoing fevers.  She has been on quite broad-spectrum antibiotics in the form of vancomycin and cefepime and now more recently Zosyn.  She has been febrile to 103 degrees with worsening leukemoid reaction.  My partner Dr. Baxter Flattery saw the patient yesterday and believes that the patient's clinical picture is consistent with a malignancy driving her fevers leukemoid reaction and abdominal pain and weight loss  METS and pathology to LIVER is notorious for causing massive amounts of inflammation, fevers, leukemoid reaction   I agree with this and feel that we need IR to proceed with biopsy of tissue so that the primary process can be identified  I will order repeat CT abdomen on chance she now has an abdominal abscess though I doubt it  If repeat CT is normal there is nothing further for [place] to do from ID standpoint  I am relieved palliative care is involved.    LOS: 6 days   Alcide Evener 11/22/2019, 1:54 PM

## 2019-11-22 NOTE — Progress Notes (Signed)
PROGRESS NOTE    [name]  VEH:[id number] DOB: 1932/06/26 DOA: 11/16/2019 PCP: Lucianne Lei, MD   Brief Narrative:  HPI per Dr. Dana Allan on 11/15/2018 Patient is an 86 year old female with past medical history significant for breast cancer, hypertension, hyperlipidemia, chronic abdominal pain, rheumatoid arthritis and chronic kidney disease stage IIIA/B.  Patient was seen by the GI provider earlier today for abdominal pain.  Apparently, patient has had abdominal pain for about 2 weeks.  Patient reported constant lower abdominal pain.  There is associated poor p.o. intake and poor appetite.  Patient reported significant weight loss.  On presentation to the GI provider's office, patient was found to be hypotensive, with systolic blood pressure in the 70s.  Patient was advised to come to the hospital for further assessment and management.  Lab work done at the hospital revealed CBC of 20.2, hemoglobin of 11.1, hematocrit of 34.3 and platelet count of 563.  Chemistry revealed sodium of 134, potassium of 5.4, chloride 102, CO2 of 19, BUN of 34 with serum creatinine of 1.3 from blood sugar of 136.  INR of 1.5 and lactic acid of 3.6 were reported as well.  D-dimer was elevated.  CTA of the chest and abdomen revealed lung emphysema, bilateral lower lung lobe atelectasis, large stable soft tissue mass around the tail of pancreas and numerous liver metastasis.  Evolving splenic infarct was also queried.  No headache, no neck, no chest pain, no shortness of breath, no no nausea vomiting or change in bowel habit.  Hospitalist team will admit patient for further assessment and management.  ED Course: On presentation to the emergency room, vitals revealed temperature of 97.3, blood pressure of 72/40 - 135/97 mmHg, heart rate of 64-1 8/min and respiratory rate of 1428.  O2 sat is 98% on room air.  Lab work is as documented above.  Patient has received vitamin K for elevated INR.  Patient has been  aggressively hydrated, pancultured and started on antibiotics.  The hospitalist team has been called to admit patient.  **Interim History  Patient had a repeat CT scan as above and IR was consulted for liver lesion biopsy given is the safest option and most efficacious way of determining source of her primary cancer given her multiple liver metastasis given that the GI had limited endoscopic options for diagnosis. Likely source of Cancer is Pancreatic Cancer vs. Recurrent Breast Cancer.  Oncology was consulted for further evaluation recommendations and she is still awaiting a biopsy.  Palliative care was also called for goals of care consultation  She has been spiking temperatures but was febrile overnight.  Blood cultures will obtained again and she is given acetaminophen.  WBC has elevated and procalcitonin is also elevated.  Biopsy has been canceled for now until she is more medically stable and likely will be done Monday.  Because her WBC count was elevated and her procalcitonin was worsening I discussed the case with infectious disease Dr. Baxter Flattery who recommends de-escalating antibiotics to IV Zosyn for a couple more days and obtaining an echocardiogram and abdominal ultrasound to look for further sources infection.  Dr. Graylon Good clinically feels that her symptoms are not attributable to an active infection but likely secondary to a malignancy and recommends liver biopsy Monday. Her hemoglobin/hematocrit is on the lower side.  Typed and screened and transfused 2 units PRBCs.  She continues to spike intermittent temperatures and continues to be tachycardic, tachypneic, and hypotensive.  Was restarted on IV fluids and infectious diseases was called  for further evaluation given her worsening WBC and procalcitonin level.  Fluids have now been stopped.  Fortunately she is going for biopsy today.  Infectious disease believes that the patient's clinical picture is consistent with a malignancy driving her fevers,  leukemoid reaction and abdominal weight loss and pain.  She is going to go for an IR biopsy today and infectious diseases are repeating a CT of the abdomen and pelvis on chest that she has an abdominal abscess.  Palliative care continues to follow.  Assessment & Plan:   Active Problems:   Sepsis ([place])   Protein-calorie malnutrition, severe   Palliative care by specialist   Goals of care, counseling/discussion   Splenic mass   Liver metastases ([place])   Pancreatic mass  Sepsis/SIRS of Unclear etiology -Admit patient for further assessment and management. -Unclear etiology -She was hypotensive, tachycardic, tachypneic and had a leukocytosis and unclear source but she does have widespread disease; also admitted lactic acidosis on admission; she has intermittently been febrile -Procalcitonin was elevated and went from 2.90 is now significantly elevated to 70.13 is now trending back down to 57.68 Continue with empiric antibiotics with IV vancomycin and IV cefepime for now and will check MRSA PCR and de-escalate and stop vancomycin if negative -IV fluid hydration had stopped but now reinitiated 75 mils per hour -WBC remains elevated; initially was improving but it worsened and is now 40.6 -Continue to follow blood cultures and re-cultured the day before yesterday -Urinalysis was unremarkable but did show some rare bacteria  -Blood cultures and urine culture done and blood culture showed no growth to date at 5 days and urine culture showed no growth; Repeat Blood Cx no growth to date at 4 days -CT of the Chest/Abdomen/Pelvis showed "Moderate severity emphysematous lung disease. Mild left upper lobe and bilateral lower lobe atelectasis.  Large stable soft tissue mass along the region of the pancreatic tail and splenic hilum, consistent with primary versus metastatic neoplastic disease. Numerous stable liver lesions consistent with liver metastasis. Stable soft tissue masses within the mid to upper  left abdomen and region of the splenic flexure consistent with additional neoplastic lesions. Stable findings likely consistent with an evolving splenic infarct. No evidence of a retroperitoneal hematoma in the presence of lower abdominal pain and hypotension." -Infectious diseases is repeating the CT of the abdomen pelvis -Patient complaining still complaining about abdominal pain so we will continue acetaminophen but will also add oxycodone IR -She was febrile again today so she will be given more acetaminophen -Continue to monitor temperature curve and repeat CBC in a.m. -I spoke with infectious diseases Dr. Carlyle Basques who recommends de-escalating antibiotics to just IV Zosyn which I have done.  I have stopped the IV vancomycin and IV cefepime.  She recommends obtaining an echocardiogram to rule out possible valve vegetation and obtaining a abdominal ultrasound to look for her ascites given her history of hepatitis -Echocardiogram normal and ultrasound showed no ascites -Dr. Baxter Flattery clinically does not feel that the patient has an active infection and feels that symptoms are likely related to malignancy and recommends that we find the etiology with a biopsy of her primary cancer.  Because the patient worsen I asked Dr. Baxter Flattery to evaluate formally and she continues to recommend continuing Zosyn and following up on current cultures and feels at this time does not recommend further investigations other than a tissue sampling for pathology and would still want to discuss with oncology and IR to get a tissue diagnosis and  help identify cause of intra-abdominal mass -Infectious diseases believes that she is having fevers and leukemoid reaction in the setting of her malignancy and has no other further recommendations if CT of the abdomen pelvis repeated shows no evidence of abscesses  Elevated D-dimer -D-Dimer was 3.14 -Had a CTA of the chest PE study which showed no Filling Defects -Will obtain a LE  Venous Duplex and this was Negative for DVT bilaterally  -Echo done as below  Coagulopathy -Had an INR of 1.5 on Admission and given 5 mg po Vitamin K -Repeat INR this AM a few days ago was 1.3 -Repeat PT/INR in a.m.  Hepatitis C with cirrhosis and SVR -She is status post Harvoni in 2017 -Followed by a hepatologist at [facility]  -previously was drinking 2 cans of beer daily but stopped -Checking ultrasound limited to evaluate for ascites and if she does have ascites will need a therapeutic and diagnostic paracentesis; no ascites noted on limited ultrasound  Widespread metastatic disease with liver lesions and anorexia, weight loss intermittent abdominal pain in the setting of pancreatic cancer versus recurrent breast cancer -Has a history of Breast Cancer status post radiation, left breast mastectomy as well as Arimidex and follows with Dr. Lindi Adie -Patient will need tissue diagnosis for her metastatic carcinoma -Have a large pancreatic mass with liver metastasis which is concerning for metastatic pancreatic cancer however unfortunately until a biopsy is obtained cannot rule out metastatic breast cancer -Consult IR for liver biopsy and this will hopefully be done today -Also consulted medical oncology Dr. Nicholas Lose and appreciate his insight and recc's: Dr. Lindi Adie believes that if she does have metastatic pancreatic cancer she has an extremely poor prognosis and he would not offer her any systemic chemotherapy and recommend that hospice care would be indicated but if she does have metastatic breast cancer that he would consider systemic treatment options -Her AFP tumor marker was 5.7 and CA 19-9 was <3 -Likely contributing to the abdominal pain.  -Plan for consultants including IR and medical oncology and likely needs a biopsy at some point and hopefully this can be done Monday as this will be vital for the patient's diagnosis and treatment plan of care -Palliative Care has also been Consulted for  [place] Discussion  Hypotension, stable -Patient has had poor p.o. intake -Blood pressure is on the lower side and today it was 86/36 this afternoon -Monitor closely -Hold Amlodipine for now and continue monitor blood pressures per protocol -Presented with a blood pressure 72/40 -TSH was normal   Lactic Acidosis -The setting of sepsis of unknown etiology and dehydration -Improved as lactic acid level from 3.2 is now 1.9 improved  AKI initially improved but now worsened again Elevated anion gap -In the setting of very poor p.o. intake and dehydration and hypotension -Patient's BUN/creatinine went from 34/1.34 to 9/0.75 and now is worsened to 9/1.11 -IV fluid hydration has now been discontinued for now but may need to continue -Avoid nephrotoxic medications, contrast dyes, hypotension and renally adjust medications -Repeat CMP P in the a.m.  Metabolic Acidosis -CO2 on admission was 19 and today it has dropped to 14; now CO2 is 22, chloride level is 104, anion gap is 16 -IV fluid hydration is now stopped -Continue to monitor and trend and repeat CMP in a.m.  Normocytic Anemia  -Patient's hemoglobin/hematocrit went from 11.1/34.3 and then dropped to 8.6/26.9 and further dropped 7.2/22.5; question if this could be a dilutional drop vs Bleeding -transfuse if hemoglobin falls less than 7;  could be in the setting of chronic disease and malignancy -Check anemia panel but not done -She is typed and screened and transfused 2 unit RBCs and is now 10.7/33.1 after the blood transfusion -Continue to monitor for symptoms of bleeding; currently no overt bleeding noted -Repeat CBC in a.m.  Hypomagnesemia -Patient's magnesium level 1.8 -continue monitor and replete as necessary -Repeat CMP in a.m.  Hypophosphatemia -Patient's phosphorus level was 2.9 -Continue to monitor and replete as necessary -Repeat phosphorus level in a.m.  Thrombocytosis  -Lately count on admission was 563 and then  dropped to 450 now has normalized at 394 -> 387 -> 391 -> 346 -> 296; likely in the setting of her widespread metastatic disease and sepsis picture -Continue to monitor for signs and symptoms of bleeding -Repeat CBC in a.m.  Poor p.o. intake/Underweight/severe malnutrition in the context of chronic illness -Consult Dietitian for further evaluation and recommendations  -Now that she is on a diet appreciate further recommendations  Hypokalemia -Patient's potassium this morning was 3.2 -Replete with p.o. potassium chloride 40 mEQ twice daily x2 doses -Continue to monitor and replete as necessary -Repeat CMP in a.m.  Elevated AST -AST went from 20 -> 35 -> 43 -In the setting of Liver Metastasis -CT Abdomen and Pelvis is being repeated and  -Continue to Monitor and Trend -Repeat CMP in AM  Hyperbilirubinemia  -Likely from Liver Mets and SIRS from Above -T Bili went from 0.9 -> 1.6 -CT of the Abdomen and Pelvis being repeated by Infectious Diseases -Continue to Monitor and Trend -Repeat CMP in AM   DVT prophylaxis: SCDs given that she will be going for Liver Biopsy  Code Status: FULL CODE   Family Communication: No family present at bedside  Disposition Plan: Patient came from: Home                                                                                                                          Anticipated d/c place: To be determined Barriers to d/c OR conditions which need to be met to effect a safe d/c: Further work-up with liver biopsy evaluation with oncology and IR as well as palliative care goals of care discussion as well as medical stability and treatment of her sepsis  Consultants:   Gastroenterology Dr. Tarri Glenn  Medical Oncology Dr. Lindi Adie  Interventional Radiology  Palliative Care Medicine   Infectious Diseases Dr. Arlyss Repress. Tommy Medal   Procedures: Liver Biopsy to be done LE Venous Duplex  ECHOCARDIOGRAM IMPRESSIONS    1. Left ventricular  ejection fraction, by visual estimation, is 70 to  75%. The left ventricle has hyperdynamic function. There is no left  ventricular hypertrophy.  2. Left ventricular diastolic parameters are consistent with Grade I  diastolic dysfunction (impaired relaxation).  3. The left ventricle has no regional wall motion abnormalities.  4. Global right ventricle has normal systolic function.The right  ventricular size is normal. No increase in right ventricular wall  thickness.  5.  Left atrial size was normal.  6. Right atrial size was normal.  7. The mitral valve is degenerative. No evidence of mitral valve  regurgitation. No evidence of mitral stenosis.  8. The tricuspid valve is normal in structure.  9. The tricuspid valve is normal in structure. Tricuspid valve  regurgitation is mild.  10. Aortic valve mean gradient measures 11.0 mmHg.  11. Aortic valve peak gradient measures 21.9 mmHg.  12. The aortic valve is tricuspid. Aortic valve regurgitation is not  visualized. Mild aortic valve stenosis.  13. The pulmonic valve was normal in structure. Pulmonic valve  regurgitation is not visualized.  14. The inferior vena cava is normal in size with greater than 50%  respiratory variability, suggesting right atrial pressure of 3 mmHg.   FINDINGS  Left Ventricle: Left ventricular ejection fraction, by visual estimation,  is 70 to 75%. The left ventricle has hyperdynamic function. The left  ventricle has no regional wall motion abnormalities. There is no left  ventricular hypertrophy. Left  ventricular diastolic parameters are consistent with Grade I diastolic  dysfunction (impaired relaxation). Normal left atrial pressure.   Right Ventricle: The right ventricular size is normal. No increase in  right ventricular wall thickness. Global RV systolic function is has  normal systolic function.   Left Atrium: Left atrial size was normal in size.   Right Atrium: Right atrial size was normal  in size   Pericardium: There is no evidence of pericardial effusion.   Mitral Valve: The mitral valve is degenerative in appearance. There is  mild thickening of the mitral valve leaflet(s). No evidence of mitral  valve regurgitation. No evidence of mitral valve stenosis by observation.   Tricuspid Valve: The tricuspid valve is normal in structure. Tricuspid  valve regurgitation is mild.   Aortic Valve: The aortic valve is tricuspid. . There is moderate  thickening and mild calcification of the aortic valve. Aortic valve  regurgitation is not visualized. Mild aortic stenosis is present. There is  moderate thickening of the aortic valve. There  is mild calcification of the aortic valve. Aortic valve mean gradient  measures 11.0 mmHg. Aortic valve peak gradient measures 21.9 mmHg. Aortic  valve area, by VTI measures 1.62 cm.   Pulmonic Valve: The pulmonic valve was normal in structure. Pulmonic valve  regurgitation is not visualized. Pulmonic regurgitation is not visualized.   Aorta: The aortic root, ascending aorta and aortic arch are all  structurally normal, with no evidence of dilitation or obstruction.   Venous: The inferior vena cava is normal in size with greater than 50%  respiratory variability, suggesting right atrial pressure of 3 mmHg.   IAS/Shunts: No atrial level shunt detected by color flow Doppler. There is  no evidence of a patent foramen ovale. No ventricular septal defect is  seen or detected. There is no evidence of an atrial septal defect.     LEFT VENTRICLE  PLAX 2D  LVIDd:     3.10 cm Diastology  LVIDs:     1.80 cm LV e' lateral:  11.10 cm/s  LV PW:     0.90 cm LV E/e' lateral: 6.1  LV IVS:    0.90 cm LV e' medial:  9.36 cm/s  LVOT diam:   1.75 cm LV E/e' medial: 7.3  LV SV:     28 ml  LV SV Index:  20.49  LVOT Area:   2.41 cm     RIGHT VENTRICLE  TAPSE (M-mode): 1.8 cm   LEFT ATRIUM  Index     RIGHT ATRIUM      Index  LA diam:   3.50 cm 2.50 cm/m RA Area:   13.00 cm  LA Vol (A4C): 36.3 ml 25.88 ml/m RA Volume:  29.90 ml 21.32 ml/m  AORTIC VALVE  AV Area (Vmax):  1.50 cm  AV Area (Vmean):  1.80 cm  AV Area (VTI):   1.62 cm  AV Vmax:      234.00 cm/s  AV Vmean:     155.000 cm/s  AV VTI:      0.375 m  AV Peak Grad:   21.9 mmHg  AV Mean Grad:   11.0 mmHg  LVOT Vmax:     146.00 cm/s  LVOT Vmean:    116.000 cm/s  LVOT VTI:     0.252 m  LVOT/AV VTI ratio: 0.67    AORTA  Ao Root diam: 2.90 cm   MITRAL VALVE  MV Area (PHT): 4.21 cm       SHUNTS  MV PHT:    52.20 msec      Systemic VTI: 0.25 m  MV Decel Time: 180 msec       Systemic Diam: 1.75 cm  MV E velocity: 68.10 cm/s 103 cm/s  MV A velocity: 72.00 cm/s 70.3 cm/s  MV E/A ratio: 0.95    1.5    Antimicrobials: Anti-infectives (From admission, onward)   Start     Dose/Rate Route Frequency Ordered Stop   11/20/19 1200  piperacillin-tazobactam (ZOSYN) IVPB 3.375 g     3.375 g 12.5 mL/hr over 240 Minutes Intravenous Every 8 hours 11/20/19 1134     11/16/19 2030  ceFEPIme (MAXIPIME) 2 g in sodium chloride 0.9 % 100 mL IVPB  Status:  Discontinued     2 g 200 mL/hr over 30 Minutes Intravenous Every 24 hours 11/16/19 2017 11/20/19 1130   11/16/19 2030  vancomycin (VANCOREADY) IVPB 750 mg/150 mL  Status:  Discontinued     750 mg 150 mL/hr over 60 Minutes Intravenous Every 48 hours 11/16/19 2017 11/20/19 1130   11/16/19 1500  piperacillin-tazobactam (ZOSYN) IVPB 3.375 g     3.375 g 100 mL/hr over 30 Minutes Intravenous  Once 11/16/19 1456 11/16/19 1637     Subjective: Seen and Examined at bedside doing okay but was again febrile last night and became tachycardic, tachypneic and hypotensive.  Going for her biopsy today.  Denies any chest pain, lightheadedness or dizziness.  No nausea or vomiting.  No other concerns or complaints at this  time.  Objective: Vitals:   11/22/19 1527 11/22/19 1538 11/22/19 1543 11/22/19 1548  BP: (!) 90/42 (!) 84/40 (!) 81/42 (!) 86/36  Pulse: 87 86 89 87  Resp: (!) 25 (!) 21 (!) 21 (!) 23  Temp:      TempSrc:      SpO2: 95% 96% 98% 97%  Weight:      Height:        Intake/Output Summary (Last 24 hours) at 11/22/2019 1550 Last data filed at 11/22/2019 1500 Gross per 24 hour  Intake 1420 ml  Output --  Net 1420 ml   Filed Weights   11/16/19 1244 11/16/19 2240  Weight: 44.2 kg 43.8 kg   Examination: Physical Exam:  Constitutional: Thin elderly African-American female currently no acute distress appears calm Eyes: Lids and conjunctivae normal, sclerae anicteric  ENMT: External Ears, Nose appear normal. Grossly normal hearing.  Neck: Appears normal, supple, no cervical masses, normal ROM, no appreciable thyromegaly: No JVD Respiratory:  Slightly diminished to auscultation bilaterally, no wheezing, rales, rhonchi or crackles. Normal respiratory effort and patient is not tachypenic. No accessory muscle use.  Wearing supplemental oxygen via nasal cannula Cardiovascular: RRR, no murmurs / rubs / gallops. S1 and S2 auscultated.  Mild 1+ extremity edema. Abdomen: Soft, non-tender, non-distended. Bowel sounds positive x4.  GU: Deferred. Musculoskeletal: No clubbing / cyanosis of digits/nails. No joint deformity upper and lower extremities. Skin: No rashes, lesions, ulcers on limited skin evaluation. No induration; Warm and dry.  Neurologic: CN 2-12 grossly intact with no focal deficits. Romberg sign and cerebellar reflexes not assessed.  Psychiatric: Normal judgment and insight. Alert and oriented x 3. Normal mood and appropriate affect.   Data Reviewed: I have personally reviewed following labs and imaging studies  CBC: Recent Labs  Lab 11/18/19 1915 11/18/19 1915 11/19/19 0046 11/19/19 2135 11/20/19 0314 11/21/19 0941 11/22/19 0650  WBC 25.6*  --  24.6*  --  21.6* 36.8* 40.6*   NEUTROABS 23.3*  --  21.4*  --  17.7* 32.6* 36.6*  HGB 7.1*   < > 7.2* 12.1 11.1* 10.2* 10.7*  HCT 22.8*   < > 22.5* 35.6* 31.7* 31.1* 33.1*  MCV 91.6  --  89.3  --  84.5 89.1 89.7  PLT 387  --  391  --  346 296 313   < > = values in this interval not displayed.   Basic Metabolic Panel: Recent Labs  Lab 11/18/19 1915 11/18/19 1915 11/19/19 0046 11/20/19 0314 11/21/19 0321 11/21/19 0941 11/22/19 0650  NA 138  --  138 136 137  --  142  K 3.8  --  4.0 3.1* 3.8  --  3.2*  CL 112*  --  111 101 100  --  104  CO2 14*  --  14* 24 26  --  22  GLUCOSE 96  --  62* 105* 98  --  77  BUN 12  --  '12 9 9  ' --  9  CREATININE 0.84  --  0.87 0.75 1.04*  --  1.11*  CALCIUM 8.1*  --  8.1* 7.7* 7.7*  --  8.1*  MG 1.4*  --  1.5* 2.3  --  2.0 1.8  PHOS 2.4*   < > 2.4* 2.5 2.1* 2.0* 2.9   < > = values in this interval not displayed.   GFR: Estimated Creatinine Clearance: 25.6 mL/min (A) (by C-G formula based on SCr of 1.11 mg/dL (H)). Liver Function Tests: Recent Labs  Lab 11/18/19 1915 11/19/19 0046 11/20/19 0314 11/21/19 1350 11/22/19 0650  AST '21 24 20 ' 35 43*  ALT '13 11 13 13 12  ' ALKPHOS 133* 134* 124 147* 193*  BILITOT 0.9 0.9 0.9 0.8 1.6*  PROT 5.6* 5.6* 5.4* 5.0* 5.2*  ALBUMIN 2.0* 2.0* 1.8* 1.6* 1.6*   No results for input(s): LIPASE, AMYLASE in the last 168 hours. No results for input(s): AMMONIA in the last 168 hours. Coagulation Profile: Recent Labs  Lab 11/16/19 1033 11/18/19 1222  INR 1.5* 1.3*   Cardiac Enzymes: No results for input(s): CKTOTAL, CKMB, CKMBINDEX, TROPONINI in the last 168 hours. BNP (last 3 results) No results for input(s): PROBNP in the last 8760 hours. HbA1C: No results for input(s): HGBA1C in the last 72 hours. CBG: Recent Labs  Lab 11/19/19 0241  GLUCAP 142*   Lipid Profile: No results for input(s): CHOL, HDL, LDLCALC, TRIG, CHOLHDL, LDLDIRECT in the last 72 hours. Thyroid Function Tests: No results for input(s): TSH, T4TOTAL, FREET4,  T3FREE, THYROIDAB in the last 72 hours. Anemia Panel: No results for input(s): VITAMINB12, FOLATE, FERRITIN, TIBC, IRON, RETICCTPCT in the last 72 hours. Sepsis Labs: Recent Labs  Lab 11/16/19 1634 11/16/19 2101 11/18/19 1838 11/18/19 1915 11/18/19 2134 11/19/19 0046 11/20/19 0314 11/21/19 0941 11/22/19 0650  PROCALCITON  --   --   --    < >  --  3.04 13.84 70.13 57.68  LATICACIDVEN 3.6* 1.9 1.7  --  1.5  --   --   --   --    < > = values in this interval not displayed.    Recent Results (from the past 240 hour(s))  SARS CORONAVIRUS 2 (TAT 6-24 HRS) Nasopharyngeal Nasopharyngeal Swab     Status: None   Collection Time: 11/16/19  1:32 PM   Specimen: Nasopharyngeal Swab  Result Value Ref Range Status   SARS Coronavirus 2 NEGATIVE NEGATIVE Final    Comment: (NOTE) SARS-CoV-2 target nucleic acids are NOT DETECTED. The SARS-CoV-2 RNA is generally detectable in upper and lower respiratory specimens during the acute phase of infection. Negative results do not preclude SARS-CoV-2 infection, do not rule out co-infections with other pathogens, and should not be used as the sole basis for treatment or other patient management decisions. Negative results must be combined with clinical observations, patient history, and epidemiological information. The expected result is Negative. Fact Sheet for Patients: SugarRoll.be Fact Sheet for Healthcare Providers: https://www.woods-mathews.com/ This test is not yet approved or cleared by the [place] FDA and  has been authorized for detection and/or diagnosis of SARS-CoV-2 by FDA under an Emergency Use Authorization (EUA). This EUA will remain  in effect (meaning this test can be used) for the duration of the COVID-19 declaration under Section 56 4(b)(1) of the Act, 21 U.S.C. section 360bbb-3(b)(1), unless the authorization is terminated or revoked sooner. Performed at [facility], [place] [address], [place], [place] [address]   Culture, blood (routine x 2)     Status: None   Collection Time: 11/16/19  2:11 PM   Specimen: BLOOD  Result Value Ref Range Status   Specimen Description BLOOD RIGHT ANTECUBITAL  Final   Special Requests   Final    BOTTLES DRAWN AEROBIC AND ANAEROBIC Blood Culture adequate volume   Culture   Final    NO GROWTH 5 DAYS Performed at [facility], [place] [address] [place], [place] [address]    Report Status 11/21/2019 FINAL  Final  Culture, blood (routine x 2)     Status: None   Collection Time: 11/16/19  2:26 PM   Specimen: BLOOD  Result Value Ref Range Status   Specimen Description BLOOD RIGHT ANTECUBITAL  Final   Special Requests   Final    BOTTLES DRAWN AEROBIC AND ANAEROBIC Blood Culture adequate volume   Culture   Final    NO GROWTH 5 DAYS Performed at [facility], [place] [address], [place], [place] [address]    Report Status 11/21/2019 FINAL  Final  Urine culture     Status: None   Collection Time: 11/17/19  5:16 AM   Specimen: Urine, Random  Result Value Ref Range Status   Specimen Description URINE, RANDOM  Final   Special Requests NONE  Final   Culture   Final    NO GROWTH Performed at [facility], [place] [address], [place], [place] [address]    Report Status 11/17/2019 FINAL  Final  MRSA PCR Screening  Status: None   Collection Time: 11/18/19  6:00 PM   Specimen: Nasal Mucosa; Nasopharyngeal  Result Value Ref Range Status   MRSA by PCR NEGATIVE NEGATIVE Final    Comment:        The GeneXpert MRSA Assay (FDA approved for NASAL specimens only), is one component of a comprehensive MRSA colonization surveillance program. It is not intended to diagnose MRSA infection nor to guide or monitor treatment for MRSA infections. Performed at [facility], [place] [address], [place], [place] [address]   Culture, blood (routine x 2)     Status: None (Preliminary result)   Collection Time: 11/18/19  6:38 PM    Specimen: BLOOD RIGHT WRIST  Result Value Ref Range Status   Specimen Description BLOOD RIGHT WRIST  Final   Special Requests   Final    BOTTLES DRAWN AEROBIC AND ANAEROBIC Blood Culture results may not be optimal due to an inadequate volume of blood received in culture bottles   Culture   Final    NO GROWTH 4 DAYS Performed at [facility], [place] [address] [place], [place] [address]    Report Status PENDING  Incomplete  Culture, blood (routine x 2)     Status: None (Preliminary result)   Collection Time: 11/18/19  6:42 PM   Specimen: BLOOD RIGHT WRIST  Result Value Ref Range Status   Specimen Description BLOOD RIGHT WRIST  Final   Special Requests   Final    BOTTLES DRAWN AEROBIC AND ANAEROBIC Blood Culture adequate volume   Culture   Final    NO GROWTH 4 DAYS Performed at [facility], [place] [address] [place], [place] [address]    Report Status PENDING  Incomplete    Radiology Studies: DG CHEST PORT 1 VIEW  Result Date: 11/22/2019 CLINICAL DATA:  Dyspnea, chest pain and cough. EXAM: PORTABLE CHEST 1 VIEW COMPARISON:  Chest x-ray 11/18/2019 FINDINGS: The cardiac silhouette, mediastinal and hilar contours are stable. There is extensive calcification of the thoracic aorta. Underlying significant chronic lung disease with emphysema and pulmonary scarring. Suspect superimposed new interstitial edema with thickened inter septal lines (Kerley B-lines), left pleural effusion and bibasilar atelectasis. IMPRESSION: Chronic underlying lung disease with superimposed interstitial edema, left pleural effusion and bibasilar atelectasis. Electronically Signed   By: Marijo Sanes M.D.   On: 11/22/2019 07:18   Scheduled Meds: . fentaNYL      . gelatin adsorbable      . lidocaine (PF)      . midazolam      . potassium chloride  40 mEq Oral BID   Continuous Infusions: . piperacillin-tazobactam (ZOSYN)  IV 3.375 g (11/22/19 1400)    LOS: 6 days   Kerney Elbe, DO [facility] PAGER is on [place]  If 7PM-7AM, please contact night-coverage [email or web address]

## 2019-11-22 NOTE — Sedation Documentation (Signed)
Bandaid to right upper abdomen, clean dry and intact

## 2019-11-22 NOTE — Progress Notes (Signed)
PT received from IR. VSS. R upper abdomen drsg clean, dry and intact. Call light in reach.  Clyde Canterbury, RN

## 2019-11-23 ENCOUNTER — Ambulatory Visit (HOSPITAL_COMMUNITY): Payer: Medicare Other

## 2019-11-23 ENCOUNTER — Encounter (HOSPITAL_COMMUNITY): Payer: Self-pay

## 2019-11-23 LAB — MAGNESIUM: Magnesium: 2 mg/dL (ref 1.7–2.4)

## 2019-11-23 LAB — COMPREHENSIVE METABOLIC PANEL
ALT: 14 U/L (ref 0–44)
AST: 33 U/L (ref 15–41)
Albumin: 1.5 g/dL — ABNORMAL LOW (ref 3.5–5.0)
Alkaline Phosphatase: 234 U/L — ABNORMAL HIGH (ref 38–126)
Anion gap: 14 (ref 5–15)
BUN: 11 mg/dL (ref 8–23)
CO2: 20 mmol/L — ABNORMAL LOW (ref 22–32)
Calcium: 8.2 mg/dL — ABNORMAL LOW (ref 8.9–10.3)
Chloride: 106 mmol/L (ref 98–111)
Creatinine, Ser: 1.1 mg/dL — ABNORMAL HIGH (ref 0.44–1.00)
GFR calc Af Amer: 53 mL/min — ABNORMAL LOW (ref >60)
GFR calc non Af Amer: 46 mL/min — ABNORMAL LOW (ref >60)
Glucose, Bld: 67 mg/dL — ABNORMAL LOW (ref 70–99)
Potassium: 4 mmol/L (ref 3.5–5.1)
Sodium: 140 mmol/L (ref 135–145)
Total Bilirubin: 1.2 mg/dL (ref 0.3–1.2)
Total Protein: 5.5 g/dL — ABNORMAL LOW (ref 6.5–8.1)

## 2019-11-23 LAB — PROCALCITONIN: Procalcitonin: 52.63 ng/mL

## 2019-11-23 LAB — CBC WITH DIFFERENTIAL/PLATELET
Abs Immature Granulocytes: 0.87 10*3/uL — ABNORMAL HIGH (ref 0.00–0.07)
Basophils Absolute: 0.1 10*3/uL (ref 0.0–0.1)
Basophils Relative: 0 %
Eosinophils Absolute: 0.3 10*3/uL (ref 0.0–0.5)
Eosinophils Relative: 1 %
HCT: 31.6 % — ABNORMAL LOW (ref 36.0–46.0)
Hemoglobin: 10.3 g/dL — ABNORMAL LOW (ref 12.0–15.0)
Immature Granulocytes: 2 %
Lymphocytes Relative: 3 %
Lymphs Abs: 1 10*3/uL (ref 0.7–4.0)
MCH: 29.2 pg (ref 26.0–34.0)
MCHC: 32.6 g/dL (ref 30.0–36.0)
MCV: 89.5 fL (ref 80.0–100.0)
Monocytes Absolute: 1.9 10*3/uL — ABNORMAL HIGH (ref 0.1–1.0)
Monocytes Relative: 5 %
Neutro Abs: 31.6 10*3/uL — ABNORMAL HIGH (ref 1.7–7.7)
Neutrophils Relative %: 89 %
Platelets: 305 10*3/uL (ref 150–400)
RBC: 3.53 MIL/uL — ABNORMAL LOW (ref 3.87–5.11)
RDW: 14.6 % (ref 11.5–15.5)
WBC: 35.7 10*3/uL — ABNORMAL HIGH (ref 4.0–10.5)
nRBC: 0 % (ref 0.0–0.2)

## 2019-11-23 LAB — CULTURE, BLOOD (ROUTINE X 2)
Culture: NO GROWTH
Culture: NO GROWTH
Special Requests: ADEQUATE

## 2019-11-23 LAB — PHOSPHORUS: Phosphorus: 2.8 mg/dL (ref 2.5–4.6)

## 2019-11-23 LAB — GLUCOSE, CAPILLARY: Glucose-Capillary: 90 mg/dL (ref 70–99)

## 2019-11-23 NOTE — Plan of Care (Signed)
Continue to monitor

## 2019-11-23 NOTE — Progress Notes (Signed)
Per CCMD, patient had a 2.4 second pause. Patient asymptomatic, lying in bed resting. MD paged via [place]. Will continue to monitor.

## 2019-11-23 NOTE — Progress Notes (Signed)
PROGRESS NOTE    [name]  MKL:[id number] DOB: [birth year] DOA: 11/16/2019 PCP: Kelly Lei, MD   Brief Narrative:  HPI per Dr. Dana [name] on 11/15/2018 Patient is an 86 year old female with past medical history significant for breast cancer, hypertension, hyperlipidemia, chronic abdominal pain, rheumatoid arthritis and chronic kidney disease stage IIIA/B.  Patient was seen by the GI provider earlier today for abdominal pain.  Apparently, patient has had abdominal pain for about 2 weeks.  Patient reported constant lower abdominal pain.  There is associated poor p.o. intake and poor appetite.  Patient reported significant weight loss.  On presentation to the GI provider's office, patient was found to be hypotensive, with systolic blood pressure in the 70s.  Patient was advised to come to the hospital for further assessment and management.  Lab work done at the hospital revealed CBC of 20.2, hemoglobin of 11.1, hematocrit of 34.3 and platelet count of 563.  Chemistry revealed sodium of 134, potassium of 5.4, chloride 102, CO2 of 19, BUN of 34 with serum creatinine of 1.3 from blood sugar of 136.  INR of 1.5 and lactic acid of 3.6 were reported as well.  D-dimer was elevated.  CTA of the chest and abdomen revealed lung emphysema, bilateral lower lung lobe atelectasis, large stable soft tissue mass around the tail of pancreas and numerous liver metastasis.  Evolving splenic infarct was also queried.  No headache, no neck, no chest pain, no shortness of breath, no no nausea vomiting or change in bowel habit.  Hospitalist team will admit patient for further assessment and management.  ED Course: On presentation to the emergency room, vitals revealed temperature of 97.3, blood pressure of 72/40 - 135/97 mmHg, heart rate of 64-1 8/min and respiratory rate of 1428.  O2 sat is 98% on room air.  Lab work is as documented above.  Patient has received vitamin K for elevated INR.  Patient has been  aggressively hydrated, pancultured and started on antibiotics.  The hospitalist team has been called to admit patient.  **Interim History  Patient had a repeat CT scan as above and IR was consulted for liver lesion biopsy given is the safest option and most efficacious way of determining source of her primary cancer given her multiple liver metastasis given that the GI had limited endoscopic options for diagnosis. Likely source of Cancer is Pancreatic Cancer vs. Recurrent Breast Cancer.  Oncology was consulted for further evaluation recommendations and she is still awaiting a biopsy.  Palliative care was also called for goals of care consultation  She has been spiking temperatures but was febrile overnight.  Blood cultures will obtained again and she is given acetaminophen.  WBC has elevated and procalcitonin is also elevated.  Biopsy has been canceled for now until she is more medically stable and likely will be done Monday.  Because her WBC count was elevated and her procalcitonin was worsening I discussed the case with infectious disease Dr. Baxter Flattery who recommends de-escalating antibiotics to IV Zosyn for a couple more days and obtaining an echocardiogram and abdominal ultrasound to look for further sources infection.  Dr. Graylon Good clinically feels that her symptoms are not attributable to an active infection but likely secondary to a malignancy and recommends liver biopsy Monday. Her hemoglobin/hematocrit is on the lower side.  Typed and screened and transfused 2 units PRBCs.  She continues to spike intermittent temperatures and continues to be tachycardic, tachypneic, and hypotensive.  Was restarted on IV fluids and infectious diseases was called  for further evaluation given her worsening WBC and procalcitonin level.  Fluids have now been stopped.  Fortunately she is going for biopsy yesterday.  Infectious disease believes that the patient's clinical picture is consistent with a malignancy driving her  fevers, leukemoid reaction and abdominal weight loss and pain in the neck she infection. Infectious diseases repeated a CT of the abdomen and pelvis to ensure that she did not have any abdominal abscess which she did not.  CT scan did show a large pancreatic tail mass with invasion to the spleen as well as [place] enhancing splenic tissue likely a combination of tumoral infiltration splenic infarcts as well as hepatic metastatic disease and abutment of the posterior greater curvature of the stomach as well as the splenic flexure of the colon by the pancreatic mass.  There is also metastatic disease and implant in the the left upper abdomen as well as a diarrheal state and she did have interval development of small to moderate bilateral pleural effusions and bilateral lower lobe atelectasis and or infiltrate with interval development of small ascites.  Infectious disease has now stopped her antibiotics and signed off the case.  Assessment & Plan:   Active Problems:   Sepsis ([place])   Protein-calorie malnutrition, severe   Palliative care by specialist   Goals of care, counseling/discussion   Splenic mass   Liver metastases ([place])   Pancreatic mass  Sepsis/SIRS of Unclear etiology -Admit patient for further assessment and management. -Unclear etiology but likely this is in the setting of her malignancy rather than infection now -She was hypotensive, tachycardic, tachypneic and had a leukocytosis and unclear source but she does have widespread disease; also admitted lactic acidosis on admission; she has intermittently been febrile -Procalcitonin was elevated and went from 2.90 is now significantly elevated to 70.13 is now trending back down to 57.68 and further to 52.63 -IV antibiotics have been changed and they were deescalated and she was stopped on IV vancomycin IV cefepime and placed on IV Zosyn but this is now stopped by infectious diseases -IV fluid hydration will now be stopped -WBC remains  elevated; initially was improving but it worsened and is now 40.6 -Continue to follow blood cultures and re-cultured the day before yesterday -Urinalysis was unremarkable but did show some rare bacteria  -Blood cultures and urine culture done and blood culture showed no growth to date at 5 days and urine culture showed no growth; Repeat Blood Cx no growth to date at 4 days -CT of the Chest/Abdomen/Pelvis showed "Moderate severity emphysematous lung disease. Mild left upper lobe and bilateral lower lobe atelectasis.  Large stable soft tissue mass along the region of the pancreatic tail and splenic hilum, consistent with primary versus metastatic neoplastic disease. Numerous stable liver lesions consistent with liver metastasis. Stable soft tissue masses within the mid to upper left abdomen and region of the splenic flexure consistent with additional neoplastic lesions. Stable findings likely consistent with an evolving splenic infarct. No evidence of a retroperitoneal hematoma in the presence of lower abdominal pain and hypotension." -Infectious diseases is repeating the CT of the abdomen pelvis -Repeat CT of the abdomen pelvis with contrast showed "Large pancreatic tail mass with invasion into the spleen. Hypoenhancing splenic tissue likely combination of tumoral infiltration and splenic infarcts.Hepatic metastatic disease. Abutment of the posterior greater curvature of the stomach as well as splenic flexure of the colon by the pancreatic mass. Metastatic disease/implant in the left upper abdomen. Diarrheal state. Clinical correlation is recommended. No  bowel obstruction. Interval development of small to moderate bilateral pleural effusions and bilateral lower lobe atelectasis or infiltrate. Interval development of small ascites.  Aortic Atherosclerosis and Emphysema." -Patient complaining still complaining about abdominal pain so we will continue Acetaminophen but will also add oxycodone IR -She was  febrile again today so she will be given more Acetaminophen -Continue to monitor temperature curve and repeat CBC in a.m. -I spoke with infectious diseases who recommended de-escalating antibiotics to just IV Zosyn which I have done.  I have stopped the IV vancomycin and IV cefepime.  She recommends obtaining an echocardiogram to rule out possible valve vegetation and obtaining a abdominal ultrasound to look for her ascites given her history of hepatitis -Echocardiogram normal and ultrasound showed no ascites -Dr. Baxter Flattery clinically DOES NOT feel that the patient has an active infection and feels that symptoms are likely related to malignancy and recommends that we find the etiology with a biopsy of her primary cancer.   -Infectious Dseases believes that patient is having fevers and leukemoid reaction in the setting of her malignancy and has no other further recommendations other than CT of the abdomen pelvis as above -Because of the findings on CT of the abdomen pelvis infectious diseases has stopped her antibiotics altogether and recommend following biopsy results  Elevated D-dimer -D-Dimer was 3.14 -Had a CTA of the chest PE study which showed no Filling Defects -Will obtain a LE Venous Duplex and this was Negative for DVT bilaterally  -Echo done as below  Coagulopathy -Had an INR of 1.5 on Admission and given 5 mg po Vitamin K -Repeat INR this AM a few days ago was 1.3 -Repeat PT/INR in a.m.  Hepatitis C with cirrhosis and SVR -She is status post Harvoni in 2017 -Followed by a hepatologist at [facility]  -previously was drinking 2 cans of beer daily but stopped -Checking ultrasound limited to evaluate for ascites and if she does have ascites will need a therapeutic and diagnostic paracentesis; no ascites noted on limited ultrasound  Widespread metastatic disease with liver lesions and anorexia, weight loss intermittent abdominal pain in the setting of pancreatic cancer versus recurrent breast  cancer -Has a history of Breast Cancer status post radiation, left breast mastectomy as well as Arimidex and follows with Dr. Lindi Adie -Patient will need tissue diagnosis for her metastatic carcinoma and this was done today and likely this is secondary to pancreatic cancer -Has a large pancreatic mass with liver metastasis which is concerning for metastatic pancreatic cancer however unfortunately until a biopsy is obtained cannot rule out metastatic breast cancer -Consult IR for liver biopsy and this was done on 11/22/2019 -Also consulted medical oncology Dr. Nicholas Lose and appreciate his insight and recc's: Dr. Lindi Adie believes that if she does have metastatic pancreatic cancer she has an extremely poor prognosis and he would not offer her any systemic chemotherapy and recommend that hospice care would be indicated but if she does have metastatic breast cancer that he would consider systemic treatment options -Her AFP tumor marker was 5.7 and CA 19-9 was <3 -Likely contributing to the abdominal pain.  -Interventional radiology biopsy the patient yesterday and will await for path results -Repeat CT Scan showed "Large pancreatic tail mass with invasion into the spleen. Hypoenhancing splenic tissue likely combination of tumoral infiltration and splenic infarcts.Hepatic metastatic disease. Abutment of the posterior greater curvature of the stomach as well as splenic flexure of the colon by the pancreatic mass. Metastatic disease/implant in the left upper abdomen. Diarrheal  state. Clinical correlation is recommended. No bowel obstruction. Interval development of small to moderate bilateral pleural effusions and bilateral lower lobe atelectasis or infiltrate. Interval development of small ascites.  Aortic Atherosclerosis and Emphysema." -Palliative Care has also been Consulted for [place] Discussion -Follow-up on pathology results as well as medical oncology recommendations  Hypotension, stable -Patient has had  poor p.o. intake -Blood pressure is on the lower side and today it was 123/51 this afternoon -Monitor closely -Hold Amlodipine for now and continue monitor blood pressures per protocol -Presented with a blood pressure 72/40 -TSH was normal   Lactic Acidosis -The setting of sepsis of unknown etiology and dehydration -Improved as lactic acid level from 3.2 is now 1.9 improved  AKI initially improved but now worsened again Elevated anion gap -In the setting of very poor p.o. intake and dehydration and hypotension -Patient's BUN/creatinine went from 34/1.34 to 9/0.75 and now is worsened to 11/1.10 -IV fluid hydration has now been discontinued for now given her findings on the CT scan with pleural effusions and mild ascites -Avoid nephrotoxic medications, contrast dyes, hypotension and renally adjust medications -Repeat CMP P in the a.m.  Metabolic Acidosis -CO2 on admission was 19 and today it has dropped to 14; now CO2 is 20, chloride level is 106, anion gap is 14 -IV fluid hydration is now stopped -Continue to monitor and trend and repeat CMP in a.m.  Normocytic Anemia  -Patient's hemoglobin/hematocrit went from 11.1/34.3 and then dropped to 8.6/26.9 and further dropped 7.2/22.5; question if this could be a dilutional drop vs Bleeding -transfuse if hemoglobin falls less than 7; could be in the setting of chronic disease and malignancy -Check anemia panel but not done -She was typed and screened and transfused 2 unit RBCs; her hemoglobin/hematocrit peaked to 10.7/33.1 after the blood transfusion and today it is 10.3/31.6 -Continue to monitor for symptoms of bleeding; currently no overt bleeding noted -Repeat CBC in a.m.  Hypomagnesemia -Patient's magnesium level 2.0 -continue monitor and replete as necessary -Repeat CMP in a.m.  Hypophosphatemia -Patient's phosphorus level was 2.8 -Continue to monitor and replete as necessary -Repeat phosphorus level in a.m.  Thrombocytosis    -Lately count on admission was 563 and then dropped to 450 now has normalized at 394 -> 387 -> 391 -> 346 -> 296 -> 305; likely in the setting of her widespread metastatic disease and sepsis picture -Continue to monitor for signs and symptoms of bleeding -Repeat CBC in a.m.  Poor p.o. intake/Underweight/severe malnutrition in the context of chronic illness -Consult Dietitian for further evaluation and recommendations  -Now that she is on a diet appreciate further recommendations  Hypokalemia -Patient's potassium this morning was 3.2 -Replete with p.o. potassium chloride 40 mEQ twice daily x2 doses -Continue to monitor and replete as necessary -Repeat CMP in a.m.  Elevated AST -AST went from 20 -> 35 -> 43 -> 33 -In the setting of Liver Metastasis -CT Abdomen and Pelvis is being repeated and  -Continue to Monitor and Trend -Repeat CMP in AM  Hyperbilirubinemia  -Likely from Liver Mets and SIRS from Above -T Bili went from 0.9 -> 1.6 -> 1.2 -CT of the Abdomen and Pelvis being repeated by Infectious Diseases and findings are as above -Continue to Monitor and Trend -Repeat CMP in AM   DVT prophylaxis: SCDs now that she has had a liver biopsy likely change back to heparin subcu Code Status: FULL CODE   Family Communication: No family present at bedside  Disposition Plan: Patient came  from: Home                                                                                                                          Anticipated d/c place: To be determined Barriers to d/c OR conditions which need to be met to effect a safe d/c: Further work-up with liver biopsy evaluation with oncology and IR as well as palliative care goals of care discussion as well as medical stability and treatment of her sepsis  Consultants:   Gastroenterology Dr. Tarri Glenn  Medical Oncology Dr. Lindi Adie  Interventional Radiology  Palliative Care Medicine   Infectious Diseases Dr. Arlyss Repress. Tommy Medal   Procedures: Liver Biopsy to be done LE Venous Duplex  ECHOCARDIOGRAM IMPRESSIONS    1. Left ventricular ejection fraction, by visual estimation, is 70 to  75%. The left ventricle has hyperdynamic function. There is no left  ventricular hypertrophy.  2. Left ventricular diastolic parameters are consistent with Grade I  diastolic dysfunction (impaired relaxation).  3. The left ventricle has no regional wall motion abnormalities.  4. Global right ventricle has normal systolic function.The right  ventricular size is normal. No increase in right ventricular wall  thickness.  5. Left atrial size was normal.  6. Right atrial size was normal.  7. The mitral valve is degenerative. No evidence of mitral valve  regurgitation. No evidence of mitral stenosis.  8. The tricuspid valve is normal in structure.  9. The tricuspid valve is normal in structure. Tricuspid valve  regurgitation is mild.  10. Aortic valve mean gradient measures 11.0 mmHg.  11. Aortic valve peak gradient measures 21.9 mmHg.  12. The aortic valve is tricuspid. Aortic valve regurgitation is not  visualized. Mild aortic valve stenosis.  13. The pulmonic valve was normal in structure. Pulmonic valve  regurgitation is not visualized.  14. The inferior vena cava is normal in size with greater than 50%  respiratory variability, suggesting right atrial pressure of 3 mmHg.   FINDINGS  Left Ventricle: Left ventricular ejection fraction, by visual estimation,  is 70 to 75%. The left ventricle has hyperdynamic function. The left  ventricle has no regional wall motion abnormalities. There is no left  ventricular hypertrophy. Left  ventricular diastolic parameters are consistent with Grade I diastolic  dysfunction (impaired relaxation). Normal left atrial pressure.   Right Ventricle: The right ventricular size is normal. No increase in  right ventricular wall thickness. Global RV systolic function is has  normal  systolic function.   Left Atrium: Left atrial size was normal in size.   Right Atrium: Right atrial size was normal in size   Pericardium: There is no evidence of pericardial effusion.   Mitral Valve: The mitral valve is degenerative in appearance. There is  mild thickening of the mitral valve leaflet(s). No evidence of mitral  valve regurgitation. No evidence of mitral valve stenosis by observation.   Tricuspid Valve: The tricuspid valve is normal in structure. Tricuspid  valve regurgitation  is mild.   Aortic Valve: The aortic valve is tricuspid. . There is moderate  thickening and mild calcification of the aortic valve. Aortic valve  regurgitation is not visualized. Mild aortic stenosis is present. There is  moderate thickening of the aortic valve. There  is mild calcification of the aortic valve. Aortic valve mean gradient  measures 11.0 mmHg. Aortic valve peak gradient measures 21.9 mmHg. Aortic  valve area, by VTI measures 1.62 cm.   Pulmonic Valve: The pulmonic valve was normal in structure. Pulmonic valve  regurgitation is not visualized. Pulmonic regurgitation is not visualized.   Aorta: The aortic root, ascending aorta and aortic arch are all  structurally normal, with no evidence of dilitation or obstruction.   Venous: The inferior vena cava is normal in size with greater than 50%  respiratory variability, suggesting right atrial pressure of 3 mmHg.   IAS/Shunts: No atrial level shunt detected by color flow Doppler. There is  no evidence of a patent foramen ovale. No ventricular septal defect is  seen or detected. There is no evidence of an atrial septal defect.     LEFT VENTRICLE  PLAX 2D  LVIDd:     3.10 cm Diastology  LVIDs:     1.80 cm LV e' lateral:  11.10 cm/s  LV PW:     0.90 cm LV E/e' lateral: 6.1  LV IVS:    0.90 cm LV e' medial:  9.36 cm/s  LVOT diam:   1.75 cm LV E/e' medial: 7.3  LV SV:     28 ml  LV SV Index:   20.49  LVOT Area:   2.41 cm     RIGHT VENTRICLE  TAPSE (M-mode): 1.8 cm   LEFT ATRIUM      Index    RIGHT ATRIUM      Index  LA diam:   3.50 cm 2.50 cm/m RA Area:   13.00 cm  LA Vol (A4C): 36.3 ml 25.88 ml/m RA Volume:  29.90 ml 21.32 ml/m  AORTIC VALVE  AV Area (Vmax):  1.50 cm  AV Area (Vmean):  1.80 cm  AV Area (VTI):   1.62 cm  AV Vmax:      234.00 cm/s  AV Vmean:     155.000 cm/s  AV VTI:      0.375 m  AV Peak Grad:   21.9 mmHg  AV Mean Grad:   11.0 mmHg  LVOT Vmax:     146.00 cm/s  LVOT Vmean:    116.000 cm/s  LVOT VTI:     0.252 m  LVOT/AV VTI ratio: 0.67    AORTA  Ao Root diam: 2.90 cm   MITRAL VALVE  MV Area (PHT): 4.21 cm       SHUNTS  MV PHT:    52.20 msec      Systemic VTI: 0.25 m  MV Decel Time: 180 msec       Systemic Diam: 1.75 cm  MV E velocity: 68.10 cm/s 103 cm/s  MV A velocity: 72.00 cm/s 70.3 cm/s  MV E/A ratio: 0.95    1.5    Antimicrobials: Anti-infectives (From admission, onward)   Start     Dose/Rate Route Frequency Ordered Stop   11/20/19 1200  piperacillin-tazobactam (ZOSYN) IVPB 3.375 g  Status:  Discontinued     3.375 g 12.5 mL/hr over 240 Minutes Intravenous Every 8 hours 11/20/19 1134 11/23/19 0946   11/16/19 2030  ceFEPIme (MAXIPIME) 2 g in sodium chloride 0.9 % 100 mL IVPB  Status:  Discontinued     2 g 200 mL/hr over 30 Minutes Intravenous Every 24 hours 11/16/19 2017 11/20/19 1130   11/16/19 2030  vancomycin (VANCOREADY) IVPB 750 mg/150 mL  Status:  Discontinued     750 mg 150 mL/hr over 60 Minutes Intravenous Every 48 hours 11/16/19 2017 11/20/19 1130   11/16/19 1500  piperacillin-tazobactam (ZOSYN) IVPB 3.375 g     3.375 g 100 mL/hr over 30 Minutes Intravenous  Once 11/16/19 1456 11/16/19 1637     Subjective: Seen and Examined at bedside and she is doing fine but still complaining of some abdominal pain.  No chest pain,  lightheadedness or dizziness.  Had a 2.4-second pause on telemetry today.  Denies any lightheadedness or dizziness and was asymptomatic at that time.  No other concerns or complaints at this time.  Objective: Vitals:   11/23/19 0735 11/23/19 1135 11/23/19 1200 11/23/19 1300  BP: (!) 128/53 (!) 117/56    Pulse: 98 97 92 93  Resp: (!) 25 (!) '22 19 18  ' Temp: 99.1 F (37.3 C) 99 F (37.2 C)    TempSrc: Oral Oral    SpO2: 96% 93% 94% 92%  Weight:      Height:        Intake/Output Summary (Last 24 hours) at 11/23/2019 1534 Last data filed at 11/23/2019 1336 Gross per 24 hour  Intake 320 ml  Output 400 ml  Net -80 ml   Filed Weights   11/16/19 1244 11/16/19 2240  Weight: 44.2 kg 43.8 kg   Examination: Physical Exam:  Constitutional: An elderly African-American female currently in no acute distress appears calm but slightly uncomfortable in the bed Eyes: Lids and conjunctivae normal, sclerae anicteric  ENMT: External Ears, Nose appear normal. Grossly normal hearing.  Neck: Appears normal, supple, no cervical masses, normal ROM, no appreciable thyromegaly; no JVD Respiratory: Diminished to auscultation bilaterally with slight coarse breath sounds and mild crackles at the bases, no wheezing, rales, rhonchi or crackles. Normal respiratory effort and patient is not tachypenic. No accessory muscle use.  Wearing supplemental oxygen via nasal cannula Cardiovascular: RRR, no murmurs / rubs / gallops. S1 and S2 auscultated.  Mild extremity edema.  Abdomen: Soft, somewhat tender, non-distended. No masses palpated.  GU: Deferred. Musculoskeletal: No clubbing / cyanosis of digits/nails. No joint deformity upper and lower extremities.  Skin: No rashes, lesions, ulcers on a limited skin evaluation. No induration; Warm and dry.  Neurologic: CN 2-12 grossly intact with no focal deficits. Romberg sign and cerebellar reflexes not assessed.  Psychiatric: Normal judgment and insight. Alert and oriented x  3. Normal mood and appropriate affect.   Data Reviewed: I have personally reviewed following labs and imaging studies  CBC: Recent Labs  Lab 11/19/19 0046 11/19/19 0046 11/19/19 2135 11/20/19 0314 11/21/19 0941 11/22/19 0650 11/23/19 0450  WBC 24.6*  --   --  21.6* 36.8* 40.6* 35.7*  NEUTROABS 21.4*  --   --  17.7* 32.6* 36.6* 31.6*  HGB 7.2*   < > 12.1 11.1* 10.2* 10.7* 10.3*  HCT 22.5*   < > 35.6* 31.7* 31.1* 33.1* 31.6*  MCV 89.3  --   --  84.5 89.1 89.7 89.5  PLT 391  --   --  346 296 313 305   < > = values in this interval not displayed.   Basic Metabolic Panel: Recent Labs  Lab 11/19/19 0046 11/19/19 0046 11/20/19 0314 11/21/19 0321 11/21/19 0941 11/22/19 0650 11/23/19 0450  NA 138  --  136 137  --  142 140  K 4.0  --  3.1* 3.8  --  3.2* 4.0  CL 111  --  101 100  --  104 106  CO2 14*  --  24 26  --  22 20*  GLUCOSE 62*  --  105* 98  --  77 67*  BUN 12  --  9 9  --  9 11  CREATININE 0.87  --  0.75 1.04*  --  1.11* 1.10*  CALCIUM 8.1*  --  7.7* 7.7*  --  8.1* 8.2*  MG 1.5*  --  2.3  --  2.0 1.8 2.0  PHOS 2.4*   < > 2.5 2.1* 2.0* 2.9 2.8   < > = values in this interval not displayed.   GFR: Estimated Creatinine Clearance: 25.9 mL/min (A) (by C-G formula based on SCr of 1.1 mg/dL (H)). Liver Function Tests: Recent Labs  Lab 11/19/19 0046 11/20/19 0314 11/21/19 1350 11/22/19 0650 11/23/19 0450  AST 24 20 35 43* 33  ALT '11 13 13 12 14  ' ALKPHOS 134* 124 147* 193* 234*  BILITOT 0.9 0.9 0.8 1.6* 1.2  PROT 5.6* 5.4* 5.0* 5.2* 5.5*  ALBUMIN 2.0* 1.8* 1.6* 1.6* 1.5*   No results for input(s): LIPASE, AMYLASE in the last 168 hours. No results for input(s): AMMONIA in the last 168 hours. Coagulation Profile: Recent Labs  Lab 11/18/19 1222  INR 1.3*   Cardiac Enzymes: No results for input(s): CKTOTAL, CKMB, CKMBINDEX, TROPONINI in the last 168 hours. BNP (last 3 results) No results for input(s): PROBNP in the last 8760 hours. HbA1C: No results for  input(s): HGBA1C in the last 72 hours. CBG: Recent Labs  Lab 11/19/19 0241  GLUCAP 142*   Lipid Profile: No results for input(s): CHOL, HDL, LDLCALC, TRIG, CHOLHDL, LDLDIRECT in the last 72 hours. Thyroid Function Tests: No results for input(s): TSH, T4TOTAL, FREET4, T3FREE, THYROIDAB in the last 72 hours. Anemia Panel: No results for input(s): VITAMINB12, FOLATE, FERRITIN, TIBC, IRON, RETICCTPCT in the last 72 hours. Sepsis Labs: Recent Labs  Lab 11/16/19 1634 11/16/19 2101 11/18/19 1838 11/18/19 1915 11/18/19 2134 11/19/19 0046 11/20/19 0314 11/21/19 0941 11/22/19 0650 11/23/19 0450  PROCALCITON  --   --   --    < >  --    < > 13.84 70.13 57.68 52.63  LATICACIDVEN 3.6* 1.9 1.7  --  1.5  --   --   --   --   --    < > = values in this interval not displayed.    Recent Results (from the past 240 hour(s))  SARS CORONAVIRUS 2 (TAT 6-24 HRS) Nasopharyngeal Nasopharyngeal Swab     Status: None   Collection Time: 11/16/19  1:32 PM   Specimen: Nasopharyngeal Swab  Result Value Ref Range Status   SARS Coronavirus 2 NEGATIVE NEGATIVE Final    Comment: (NOTE) SARS-CoV-2 target nucleic acids are NOT DETECTED. The SARS-CoV-2 RNA is generally detectable in upper and lower respiratory specimens during the acute phase of infection. Negative results do not preclude SARS-CoV-2 infection, do not rule out co-infections with other pathogens, and should not be used as the sole basis for treatment or other patient management decisions. Negative results must be combined with clinical observations, patient history, and epidemiological information. The expected result is Negative. Fact Sheet for Patients: SugarRoll.be Fact Sheet for Healthcare Providers: https://www.woods-mathews.com/ This test is not yet approved or cleared by the [place] and  has been authorized  for detection and/or diagnosis of SARS-CoV-2 by FDA under an Emergency Use  Authorization (EUA). This EUA will remain  in effect (meaning this test can be used) for the duration of the COVID-19 declaration under Section 56 4(b)(1) of the Act, 21 U.S.C. section 360bbb-3(b)(1), unless the authorization is terminated or revoked sooner. Performed at [facility], [place] [address], [place], [place] [address]   Culture, blood (routine x 2)     Status: None   Collection Time: 11/16/19  2:11 PM   Specimen: BLOOD  Result Value Ref Range Status   Specimen Description BLOOD RIGHT ANTECUBITAL  Final   Special Requests   Final    BOTTLES DRAWN AEROBIC AND ANAEROBIC Blood Culture adequate volume   Culture   Final    NO GROWTH 5 DAYS Performed at [facility], [place] [address], [place], [place] [address]    Report Status 11/21/2019 FINAL  Final  Culture, blood (routine x 2)     Status: None   Collection Time: 11/16/19  2:26 PM   Specimen: BLOOD  Result Value Ref Range Status   Specimen Description BLOOD RIGHT ANTECUBITAL  Final   Special Requests   Final    BOTTLES DRAWN AEROBIC AND ANAEROBIC Blood Culture adequate volume   Culture   Final    NO GROWTH 5 DAYS Performed at [facility], [place] [address], [place], [place] [address]    Report Status 11/21/2019 FINAL  Final  Urine culture     Status: None   Collection Time: 11/17/19  5:16 AM   Specimen: Urine, Random  Result Value Ref Range Status   Specimen Description URINE, RANDOM  Final   Special Requests NONE  Final   Culture   Final    NO GROWTH Performed at [facility], [place] [address] [place], [place] [address]    Report Status 11/17/2019 FINAL  Final  MRSA PCR Screening     Status: None   Collection Time: 11/18/19  6:00 PM   Specimen: Nasal Mucosa; Nasopharyngeal  Result Value Ref Range Status   MRSA by PCR NEGATIVE NEGATIVE Final    Comment:        The GeneXpert MRSA Assay (FDA approved for NASAL specimens only), is one component of a comprehensive MRSA colonization surveillance  program. It is not intended to diagnose MRSA infection nor to guide or monitor treatment for MRSA infections. Performed at [facility], [place] [address], [place], [place] [address]   Culture, blood (routine x 2)     Status: None   Collection Time: 11/18/19  6:38 PM   Specimen: BLOOD RIGHT WRIST  Result Value Ref Range Status   Specimen Description BLOOD RIGHT WRIST  Final   Special Requests   Final    BOTTLES DRAWN AEROBIC AND ANAEROBIC Blood Culture results may not be optimal due to an inadequate volume of blood received in culture bottles Performed at [facility], [place] [address], [place], [place] [address]    Culture NO GROWTH 5 DAYS  Final   Report Status 11/23/2019 FINAL  Final  Culture, blood (routine x 2)     Status: None   Collection Time: 11/18/19  6:42 PM   Specimen: BLOOD RIGHT WRIST  Result Value Ref Range Status   Specimen Description BLOOD RIGHT WRIST  Final   Special Requests   Final    BOTTLES DRAWN AEROBIC AND ANAEROBIC Blood Culture adequate volume Performed at [facility], [place] [address], [place], [place] [address]    Culture NO GROWTH 5 DAYS  Final   Report Status 11/23/2019 FINAL  Final    Radiology Studies: CT ABDOMEN PELVIS W CONTRAST  Result Date: 11/22/2019 CLINICAL DATA:  86 year old female with abdominal pain. History of breast cancer and pancreatic mass. EXAM: CT ABDOMEN AND PELVIS WITH CONTRAST TECHNIQUE: Multidetector CT imaging of the abdomen and pelvis was performed using the standard protocol following bolus administration of intravenous contrast. CONTRAST:  13m OMNIPAQUE IOHEXOL 300 MG/ML  SOLN COMPARISON:  CT abdomen pelvis dated 11/16/2019. FINDINGS: Lower chest: Partially visualized small to moderate bilateral pleural effusions, new since the prior CT. There are consolidative changes of the lower lobes with air bronchograms which may represent atelectasis or infiltrate. Clinical correlation is recommended. There is emphysematous  changes of the visualized lung. Advanced 3 vessel coronary vascular calcification as well as atherosclerotic calcification of the ascending aorta. No intra-abdominal free air. There is a small free fluid within the pelvis as well as small perisplenic free fluid, new since the prior CT. Hepatobiliary: Multiple (greater than 10) hepatic hypoenhancing masses measuring up to 4.7 cm in the left lobe of the liver consistent with metastatic disease. No intrahepatic biliary ductal dilatation. No calcified gallstone or pericholecystic fluid. Pancreas: There is a 7.6 x 5.2 x 5.0 cm lobulated mass with areas of decreased enhancement or necrosis arising from the tail of the pancreas and invading into the spleen. There is also extension of the mass to the left lateral peritoneal wall. This is consistent with malignancy. Spleen: Heterogeneous enhancement of the spleen with areas of decreased enhancement likely combination of tumoral invasion from the tail of the pancreas as well as areas of splenic infarct. Adrenals/Urinary Tract: The right adrenal gland is unremarkable. There is abutment of the left adrenal gland by the pancreatic tail mass. There is no hydronephrosis on either side. There is symmetric enhancement and excretion of contrast by both kidneys. Probable 1 cm right renal upper pole cyst. The urinary bladder is grossly unremarkable. Stomach/Bowel: There is loose stool throughout the colon compatible with diarrheal state. Correlation with clinical exam and stool cultures recommended. There is loss of fat plane between the pancreatic tail mass and inferior posterior greater curvature of the stomach (series 3, image 25 and axial series 6, image 68). There is also loss of fat plane between the pancreatic mass and splenic flexure of the colon. There is no bowel obstruction or active inflammation. Appendectomy. Vascular/Lymphatic: Advanced aortoiliac atherosclerotic disease. The IVC is unremarkable. No portal venous gas. No  adenopathy. Reproductive: Hysterectomy. Other: A 3 x 3 cm enhancing lesion in the left anterior peritoneum consistent with metastatic disease. Musculoskeletal: Degenerative changes of the spine. No acute osseous pathology. Diffuse subcutaneous edema. IMPRESSION: 1. Large pancreatic tail mass with invasion into the spleen. 2. Hypoenhancing splenic tissue likely combination of tumoral infiltration and splenic infarcts. 3. Hepatic metastatic disease. 4. Abutment of the posterior greater curvature of the stomach as well as splenic flexure of the colon by the pancreatic mass. 5. Metastatic disease/implant in the left upper abdomen. 6. Diarrheal state. Clinical correlation is recommended. No bowel obstruction. 7. Interval development of small to moderate bilateral pleural effusions and bilateral lower lobe atelectasis or infiltrate. Interval development of small ascites. 8. Aortic Atherosclerosis (ICD10-I70.0) and Emphysema (ICD10-J43.9). Electronically Signed   By: AAnner CreteM.D.   On: 11/22/2019 20:31   [place] (LIVER)  Result Date: 11/22/2019 INDICATION: 86year old female with a pancreatic  tail mass and multiple liver lesions concerning for metastatic disease. She presents for ultrasound-guided core biopsy to establish tissue diagnosis. EXAM: ULTRASOUND BIOPSY CORE LIVER MEDICATIONS: None. ANESTHESIA/SEDATION: 0.25 mg Versed were a given for anxiolysis. This does not constitute moderate sedation. FLUOROSCOPY TIME:  None. COMPLICATIONS: None immediate. PROCEDURE: Informed written consent was obtained from the patient after a thorough discussion of the procedural risks, benefits and alternatives. All questions were addressed. A timeout was performed prior to the initiation of the procedure. The liver was interrogated with ultrasound. Multiple echogenic masslike areas are successfully identified. A suitable lesion was selected. A skin entry site was marked. The overlying skin was sterilely prepped and draped  in the standard fashion using chlorhexidine skin prep. Local anesthesia was attained by infiltration with 1% lidocaine. A small dermatotomy was made. Under real-time ultrasound guidance, a 17 gauge introducer needle was advanced through the liver and positioned at the margin of the mass. Multiple 18 gauge core biopsies were then coaxially obtained using the bio Pince automated biopsy device. Biopsy specimens were placed in formalin and delivered to pathology for further analysis. As the introducer needle was removed, the biopsy tract was embolized with a Gel-Foam slurry. Overall, the patient tolerated the procedure well. There was no immediate complication. IMPRESSION: Technically successful ultrasound-guided core biopsy of liver lesion. Electronically Signed   By: Jacqulynn Cadet M.D.   On: 11/22/2019 16:33   DG CHEST PORT 1 VIEW  Result Date: 11/22/2019 CLINICAL DATA:  Dyspnea, chest pain and cough. EXAM: PORTABLE CHEST 1 VIEW COMPARISON:  Chest x-ray 11/18/2019 FINDINGS: The cardiac silhouette, mediastinal and hilar contours are stable. There is extensive calcification of the thoracic aorta. Underlying significant chronic lung disease with emphysema and pulmonary scarring. Suspect superimposed new interstitial edema with thickened inter septal lines (Kerley B-lines), left pleural effusion and bibasilar atelectasis. IMPRESSION: Chronic underlying lung disease with superimposed interstitial edema, left pleural effusion and bibasilar atelectasis. Electronically Signed   By: Marijo Sanes M.D.   On: 11/22/2019 07:18   Scheduled Meds:  Continuous Infusions:   LOS: 7 days   Kerney Elbe, DO [facility] PAGER is on AMION  If 7PM-7AM, please contact night-coverage [email or web address]

## 2019-11-23 NOTE — Progress Notes (Signed)
Subjective: No new complaints, feesl a bit better   Antibiotics:  Anti-infectives (From admission, onward)   Start     Dose/Rate Route Frequency Ordered Stop   11/20/19 1200  piperacillin-tazobactam (ZOSYN) IVPB 3.375 g  Status:  Discontinued     3.375 g 12.5 mL/hr over 240 Minutes Intravenous Every 8 hours 11/20/19 1134 11/23/19 0946   11/16/19 2030  ceFEPIme (MAXIPIME) 2 g in sodium chloride 0.9 % 100 mL IVPB  Status:  Discontinued     2 g 200 mL/hr over 30 Minutes Intravenous Every 24 hours 11/16/19 2017 11/20/19 1130   11/16/19 2030  vancomycin (VANCOREADY) IVPB 750 mg/150 mL  Status:  Discontinued     750 mg 150 mL/hr over 60 Minutes Intravenous Every 48 hours 11/16/19 2017 11/20/19 1130   11/16/19 1500  piperacillin-tazobactam (ZOSYN) IVPB 3.375 g     3.375 g 100 mL/hr over 30 Minutes Intravenous  Once 11/16/19 1456 11/16/19 1637      Medications: Scheduled Meds:  Continuous Infusions:  PRN Meds:.acetaminophen **OR** acetaminophen, oxyCODONE    Objective: Weight change:   Intake/Output Summary (Last 24 hours) at 11/23/2019 1433 Last data filed at 11/23/2019 1336 Gross per 24 hour  Intake 492.5 ml  Output 400 ml  Net 92.5 ml   Blood pressure (!) 117/56, pulse 93, temperature 99 F (37.2 C), temperature source Oral, resp. rate 18, height 5\' 2"  (1.575 m), weight 43.8 kg, SpO2 92 %. Temp:  [97.9 F (36.6 C)-99.1 F (37.3 C)] 99 F (37.2 C) (02/02 1135) Pulse Rate:  [84-98] 93 (02/02 1300) Resp:  [18-28] 18 (02/02 1300) BP: (81-128)/(36-56) 117/56 (02/02 1135) SpO2:  [90 %-98 %] 92 % (02/02 1300)  Physical Exam: General: Alert and awake, appears to have more energy today HEENT:EOMI CVS tachycardic regular rate, normal  Chest: , no wheezing, no respiratory distress Abdomen: soft non-distended,  Skin: no rashes Neuro: nonfocal  CBC:    BMET Recent Labs    11/22/19 0650 11/23/19 0450  NA 142 140  K 3.2* 4.0  CL 104 106  CO2 22 20*    GLUCOSE 77 67*  BUN 9 11  CREATININE 1.11* 1.10*  CALCIUM 8.1* 8.2*     Liver Panel  Recent Labs    11/21/19 1350 11/21/19 1350 11/22/19 0650 11/23/19 0450  PROT 5.0*   < > 5.2* 5.5*  ALBUMIN 1.6*   < > 1.6* 1.5*  AST 35   < > 43* 33  ALT 13   < > 12 14  ALKPHOS 147*   < > 193* 234*  BILITOT 0.8   < > 1.6* 1.2  BILIDIR 0.4*  --   --   --   IBILI 0.4  --   --   --    < > = values in this interval not displayed.       Sedimentation Rate No results for input(s): ESRSEDRATE in the last 72 hours. C-Reactive Protein No results for input(s): CRP in the last 72 hours.  Micro Results: Recent Results (from the past 720 hour(s))  SARS CORONAVIRUS 2 (TAT 6-24 HRS) Nasopharyngeal Nasopharyngeal Swab     Status: None   Collection Time: 11/16/19  1:32 PM   Specimen: Nasopharyngeal Swab  Result Value Ref Range Status   SARS Coronavirus 2 NEGATIVE NEGATIVE Final    Comment: (NOTE) SARS-CoV-2 target nucleic acids are NOT DETECTED. The SARS-CoV-2 RNA is generally detectable in upper and lower respiratory specimens during the acute  phase of infection. Negative results do not preclude SARS-CoV-2 infection, do not rule out co-infections with other pathogens, and should not be used as the sole basis for treatment or other patient management decisions. Negative results must be combined with clinical observations, patient history, and epidemiological information. The expected result is Negative. Fact Sheet for Patients: SugarRoll.be Fact Sheet for Healthcare Providers: https://www.woods-mathews.com/ This test is not yet approved or cleared by the [place] FDA and  has been authorized for detection and/or diagnosis of SARS-CoV-2 by FDA under an Emergency Use Authorization (EUA). This EUA will remain  in effect (meaning this test can be used) for the duration of the COVID-19 declaration under Section 56 4(b)(1) of the Act, 21  U.S.C. section 360bbb-3(b)(1), unless the authorization is terminated or revoked sooner. Performed at [facility], [place] [address] [place], [place] [address]   Culture, blood (routine x 2)     Status: None   Collection Time: 11/16/19  2:11 PM   Specimen: BLOOD  Result Value Ref Range Status   Specimen Description BLOOD RIGHT ANTECUBITAL  Final   Special Requests   Final    BOTTLES DRAWN AEROBIC AND ANAEROBIC Blood Culture adequate volume   Culture   Final    NO GROWTH 5 DAYS Performed at [facility], [place] [address] [place], [place] [address]    Report Status 11/21/2019 FINAL  Final  Culture, blood (routine x 2)     Status: None   Collection Time: 11/16/19  2:26 PM   Specimen: BLOOD  Result Value Ref Range Status   Specimen Description BLOOD RIGHT ANTECUBITAL  Final   Special Requests   Final    BOTTLES DRAWN AEROBIC AND ANAEROBIC Blood Culture adequate volume   Culture   Final    NO GROWTH 5 DAYS Performed at [facility], [place] [address] [place], [place] [address]    Report Status 11/21/2019 FINAL  Final  Urine culture     Status: None   Collection Time: 11/17/19  5:16 AM   Specimen: Urine, Random  Result Value Ref Range Status   Specimen Description URINE, RANDOM  Final   Special Requests NONE  Final   Culture   Final    NO GROWTH Performed at [facility], [place] [address] [place], [place] [address]    Report Status 11/17/2019 FINAL  Final  MRSA PCR Screening     Status: None   Collection Time: 11/18/19  6:00 PM   Specimen: Nasal Mucosa; Nasopharyngeal  Result Value Ref Range Status   MRSA by PCR NEGATIVE NEGATIVE Final    Comment:        The GeneXpert MRSA Assay (FDA approved for NASAL specimens only), is one component of a comprehensive MRSA colonization surveillance program. It is not intended to diagnose MRSA infection nor to guide or monitor treatment for MRSA infections. Performed at [facility], [place] [address]  [place], [place] [address]   Culture, blood (routine x 2)     Status: None   Collection Time: 11/18/19  6:38 PM   Specimen: BLOOD RIGHT WRIST  Result Value Ref Range Status   Specimen Description BLOOD RIGHT WRIST  Final   Special Requests   Final    BOTTLES DRAWN AEROBIC AND ANAEROBIC Blood Culture results may not be optimal due to an inadequate volume of blood received in culture bottles Performed at [facility], [place] [address], [place], [place] [address]  Culture NO GROWTH 5 DAYS  Final   Report Status 11/23/2019 FINAL  Final  Culture, blood (routine x 2)     Status: None   Collection Time: 11/18/19  6:42 PM   Specimen: BLOOD RIGHT WRIST  Result Value Ref Range Status   Specimen Description BLOOD RIGHT WRIST  Final   Special Requests   Final    BOTTLES DRAWN AEROBIC AND ANAEROBIC Blood Culture adequate volume Performed at [facility], [place] [address] [place], [place] [address]    Culture NO GROWTH 5 DAYS  Final   Report Status 11/23/2019 FINAL  Final    Studies/Results: CT ABDOMEN PELVIS W CONTRAST  Result Date: 11/22/2019 CLINICAL DATA:  86 year old female with abdominal pain. History of breast cancer and pancreatic mass. EXAM: CT ABDOMEN AND PELVIS WITH CONTRAST TECHNIQUE: Multidetector CT imaging of the abdomen and pelvis was performed using the standard protocol following bolus administration of intravenous contrast. CONTRAST:  47mL OMNIPAQUE IOHEXOL 300 MG/ML  SOLN COMPARISON:  CT abdomen pelvis dated 11/16/2019. FINDINGS: Lower chest: Partially visualized small to moderate bilateral pleural effusions, new since the prior CT. There are consolidative changes of the lower lobes with air bronchograms which may represent atelectasis or infiltrate. Clinical correlation is recommended. There is emphysematous changes of the visualized lung. Advanced 3 vessel coronary vascular calcification as well as atherosclerotic calcification of the ascending aorta. No intra-abdominal free  air. There is a small free fluid within the pelvis as well as small perisplenic free fluid, new since the prior CT. Hepatobiliary: Multiple (greater than 10) hepatic hypoenhancing masses measuring up to 4.7 cm in the left lobe of the liver consistent with metastatic disease. No intrahepatic biliary ductal dilatation. No calcified gallstone or pericholecystic fluid. Pancreas: There is a 7.6 x 5.2 x 5.0 cm lobulated mass with areas of decreased enhancement or necrosis arising from the tail of the pancreas and invading into the spleen. There is also extension of the mass to the left lateral peritoneal wall. This is consistent with malignancy. Spleen: Heterogeneous enhancement of the spleen with areas of decreased enhancement likely combination of tumoral invasion from the tail of the pancreas as well as areas of splenic infarct. Adrenals/Urinary Tract: The right adrenal gland is unremarkable. There is abutment of the left adrenal gland by the pancreatic tail mass. There is no hydronephrosis on either side. There is symmetric enhancement and excretion of contrast by both kidneys. Probable 1 cm right renal upper pole cyst. The urinary bladder is grossly unremarkable. Stomach/Bowel: There is loose stool throughout the colon compatible with diarrheal state. Correlation with clinical exam and stool cultures recommended. There is loss of fat plane between the pancreatic tail mass and inferior posterior greater curvature of the stomach (series 3, image 25 and axial series 6, image 68). There is also loss of fat plane between the pancreatic mass and splenic flexure of the colon. There is no bowel obstruction or active inflammation. Appendectomy. Vascular/Lymphatic: Advanced aortoiliac atherosclerotic disease. The IVC is unremarkable. No portal venous gas. No adenopathy. Reproductive: Hysterectomy. Other: A 3 x 3 cm enhancing lesion in the left anterior peritoneum consistent with metastatic disease. Musculoskeletal:  Degenerative changes of the spine. No acute osseous pathology. Diffuse subcutaneous edema. IMPRESSION: 1. Large pancreatic tail mass with invasion into the spleen. 2. Hypoenhancing splenic tissue likely combination of tumoral infiltration and splenic infarcts. 3. Hepatic metastatic disease. 4. Abutment of the posterior greater curvature of the stomach as well as splenic flexure of the colon by the  pancreatic mass. 5. Metastatic disease/implant in the left upper abdomen. 6. Diarrheal state. Clinical correlation is recommended. No bowel obstruction. 7. Interval development of small to moderate bilateral pleural effusions and bilateral lower lobe atelectasis or infiltrate. Interval development of small ascites. 8. Aortic Atherosclerosis (ICD10-I70.0) and Emphysema (ICD10-J43.9). Electronically Signed   By: Anner Crete M.D.   On: 11/22/2019 20:31   US BIOPSY (LIVER)  Result Date: 11/22/2019 INDICATION: 86 year old female with a pancreatic tail mass and multiple liver lesions concerning for metastatic disease. She presents for ultrasound-guided core biopsy to establish tissue diagnosis. EXAM: ULTRASOUND BIOPSY CORE LIVER MEDICATIONS: None. ANESTHESIA/SEDATION: 0.25 mg Versed were a given for anxiolysis. This does not constitute moderate sedation. FLUOROSCOPY TIME:  None. COMPLICATIONS: None immediate. PROCEDURE: Informed written consent was obtained from the patient after a thorough discussion of the procedural risks, benefits and alternatives. All questions were addressed. A timeout was performed prior to the initiation of the procedure. The liver was interrogated with ultrasound. Multiple echogenic masslike areas are successfully identified. A suitable lesion was selected. A skin entry site was marked. The overlying skin was sterilely prepped and draped in the standard fashion using chlorhexidine skin prep. Local anesthesia was attained by infiltration with 1% lidocaine. A small dermatotomy was made. Under  real-time ultrasound guidance, a 17 gauge introducer needle was advanced through the liver and positioned at the margin of the mass. Multiple 18 gauge core biopsies were then coaxially obtained using the bio Pince automated biopsy device. Biopsy specimens were placed in formalin and delivered to pathology for further analysis. As the introducer needle was removed, the biopsy tract was embolized with a Gel-Foam slurry. Overall, the patient tolerated the procedure well. There was no immediate complication. IMPRESSION: Technically successful ultrasound-guided core biopsy of liver lesion. Electronically Signed   By: Jacqulynn Cadet M.D.   On: 11/22/2019 16:33   DG CHEST PORT 1 VIEW  Result Date: 11/22/2019 CLINICAL DATA:  Dyspnea, chest pain and cough. EXAM: PORTABLE CHEST 1 VIEW COMPARISON:  Chest x-ray 11/18/2019 FINDINGS: The cardiac silhouette, mediastinal and hilar contours are stable. There is extensive calcification of the thoracic aorta. Underlying significant chronic lung disease with emphysema and pulmonary scarring. Suspect superimposed new interstitial edema with thickened inter septal lines (Kerley B-lines), left pleural effusion and bibasilar atelectasis. IMPRESSION: Chronic underlying lung disease with superimposed interstitial edema, left pleural effusion and bibasilar atelectasis. Electronically Signed   By: Marijo Sanes M.D.   On: 11/22/2019 07:18      Assessment/Plan:  INTERVAL HISTORY  CT unrevealing for infection.  She has had a biopsy of one of her liver lesions     : Active Problems:   Sepsis ([place])   Protein-calorie malnutrition, severe   Palliative care by specialist   Goals of care, counseling/discussion   Splenic mass   Liver metastases [facility])   Pancreatic mass    [name] is a 86 y.o. female with history of breast cancer, hepatitis C with cirrhosis, now admitted with a 2-week history of abdominal pain and weight loss.  Imaging has shown numerous  lesions distant with metastases in the liver as well as the pancreas and spleen all concerning for potential pancreatic cancer.  She was supposed to get a biopsy by interventional radiology but this is now been canceled due to ongoing fevers.  She has been on quite broad-spectrum antibiotics in the form of vancomycin and cefepime and now more recently Zosyn.  She has been febrile to 103 degrees with worsening leukemoid reaction.  My partner Dr. Baxter Flattery saw the patient =and believes that the patient's clinical picture is consistent with a malignancy driving her fevers leukemoid reaction and abdominal pain and weight loss  METS and pathology to LIVER is notorious for causing massive amounts of inflammation, fevers, leukemoid reaction   I agree with this and feel that we need IR to proceed with biopsy of tissue so that the primary process can be identified  Repeat CT scan shows no evidence of infectious process.  I am stopping the Zosyn.  We will sign off for now please call us back if the biopsy somehow shows that she has an infectious process that needs attention or or help as needed otherwise.      LOS: 7 days   Alcide Evener 11/23/2019, 2:33 PM

## 2019-11-24 ENCOUNTER — Inpatient Hospital Stay (HOSPITAL_COMMUNITY): Payer: Medicare Other

## 2019-11-24 DIAGNOSIS — Z170 Estrogen receptor positive status [ER+]: Secondary | ICD-10-CM

## 2019-11-24 LAB — CBC WITH DIFFERENTIAL/PLATELET
Abs Immature Granulocytes: 0.8 10*3/uL — ABNORMAL HIGH (ref 0.00–0.07)
Basophils Absolute: 0.1 10*3/uL (ref 0.0–0.1)
Basophils Relative: 0 %
Eosinophils Absolute: 0.2 10*3/uL (ref 0.0–0.5)
Eosinophils Relative: 1 %
HCT: 29.6 % — ABNORMAL LOW (ref 36.0–46.0)
Hemoglobin: 9.7 g/dL — ABNORMAL LOW (ref 12.0–15.0)
Immature Granulocytes: 3 %
Lymphocytes Relative: 4 %
Lymphs Abs: 1.1 10*3/uL (ref 0.7–4.0)
MCH: 29 pg (ref 26.0–34.0)
MCHC: 32.8 g/dL (ref 30.0–36.0)
MCV: 88.4 fL (ref 80.0–100.0)
Monocytes Absolute: 2.4 10*3/uL — ABNORMAL HIGH (ref 0.1–1.0)
Monocytes Relative: 8 %
Neutro Abs: 24.3 10*3/uL — ABNORMAL HIGH (ref 1.7–7.7)
Neutrophils Relative %: 84 %
Platelets: 290 10*3/uL (ref 150–400)
RBC: 3.35 MIL/uL — ABNORMAL LOW (ref 3.87–5.11)
RDW: 14.6 % (ref 11.5–15.5)
WBC: 28.9 10*3/uL — ABNORMAL HIGH (ref 4.0–10.5)
nRBC: 0 % (ref 0.0–0.2)

## 2019-11-24 LAB — COMPREHENSIVE METABOLIC PANEL
ALT: 12 U/L (ref 0–44)
AST: 24 U/L (ref 15–41)
Albumin: 1.4 g/dL — ABNORMAL LOW (ref 3.5–5.0)
Alkaline Phosphatase: 206 U/L — ABNORMAL HIGH (ref 38–126)
Anion gap: 11 (ref 5–15)
BUN: 12 mg/dL (ref 8–23)
CO2: 22 mmol/L (ref 22–32)
Calcium: 8.1 mg/dL — ABNORMAL LOW (ref 8.9–10.3)
Chloride: 108 mmol/L (ref 98–111)
Creatinine, Ser: 1.04 mg/dL — ABNORMAL HIGH (ref 0.44–1.00)
GFR calc Af Amer: 57 mL/min — ABNORMAL LOW (ref >60)
GFR calc non Af Amer: 49 mL/min — ABNORMAL LOW (ref >60)
Glucose, Bld: 90 mg/dL (ref 70–99)
Potassium: 3.9 mmol/L (ref 3.5–5.1)
Sodium: 141 mmol/L (ref 135–145)
Total Bilirubin: 1.1 mg/dL (ref 0.3–1.2)
Total Protein: 5 g/dL — ABNORMAL LOW (ref 6.5–8.1)

## 2019-11-24 LAB — PHOSPHORUS: Phosphorus: 3.1 mg/dL (ref 2.5–4.6)

## 2019-11-24 LAB — MAGNESIUM: Magnesium: 2 mg/dL (ref 1.7–2.4)

## 2019-11-24 MED ORDER — ENSURE ENLIVE PO LIQD
237.0000 mL | Freq: Three times a day (TID) | ORAL | Status: DC
  Administered 2019-11-24 – 2019-11-25 (×4): 237 mL via ORAL

## 2019-11-24 NOTE — Progress Notes (Signed)
Patient's HR in the 130's. BP 168/70. O2 94% on 4 liters [place]. Temp 101.5. Tylenol given. EKG done. MD notified. Will continue to monitor.

## 2019-11-24 NOTE — Progress Notes (Signed)
Per CCMD. Patient had a 2.2 second pause. Patient lying in bed resting asymptomatic. MD notified via [facility] page. Will continue to monitor.

## 2019-11-24 NOTE — Progress Notes (Signed)
PMT provider f/u with Dr. Ree Kida regarding biopsy results. Dr. Lindi Adie to see this afternoon and give recommendations. PMT provider will plan to follow-up with patient and son tomorrow, 11/25/19. Thank you.  NO CHARGE  Ihor Dow, [place], FNP-C Palliative Medicine Team  Phone: [phone] Fax: [phone]

## 2019-11-24 NOTE — Care Management Important Message (Signed)
Important Message  Patient Details  Name: [name] MRN: [id number] Date of Birth: Feb 22, 1932   Medicare Important Message Given:  Yes     Shelda Altes 11/24/2019, 11:56 AM

## 2019-11-24 NOTE — Progress Notes (Signed)
Patient did not eat well for breakfast or lunch today. Dietician ordered Ensure supplement for patient. Patient drank an entire Ensure at this time and stated "I like it". Will continue to encourage nutritional intake.   Emelda Fear, RN

## 2019-11-24 NOTE — Progress Notes (Signed)
Nutrition Follow up   DOCUMENTATION CODES:   Severe malnutrition in context of chronic illness  INTERVENTION:   If within [place] recommend placement of Cortrak as pt is severely malnourished and is day 8 without adequate nutrition.    Liberalize diet to regular   Ensure Enlive po TID, each supplement provides 350 kcal and 20 grams of protein  Magic cup TID with meals, each supplement provides 290 kcal and 9 grams of protein  MVI daily   NUTRITION DIAGNOSIS:   Severe Malnutrition related to chronic illness(metastatic carcinoma) as evidenced by percent weight loss, severe fat depletion, moderate fat depletion, severe muscle depletion.  Ongoing  GOAL:   Patient will meet greater than or equal to 90% of their needs  Not meeting   MONITOR:   Diet advancement, Weight trends, Labs  REASON FOR ASSESSMENT:   Consult Assessment of nutrition requirement/status, Poor PO, Diet education  ASSESSMENT:   Pt with a PMH significant for breast Ca, HTN, HLD, RA, CKD stage 3 C/O abdominal pain x2 weeks. Pt admitted with sepsis and found to have large pancreatic mass with numerous liver metastases with evidence of splenic infarct.   Awaiting liver biopsy results.   Pt reports appetite remains poor. Meal completions charted 0-50% for her last eight meals (9% average). Pt willing to try supplementation. GOC to be discussed further after liver biopsy resulted. If within [place] recommend placement of Cortrak tube as pt is severely malnourished and has been without adequate nutrition since admit (8 days).   Admission weight: 43.8 kg- taken 1/26   Medications: reviewed  Labs: CBG 67-90  Diet Order:   Diet Order            Diet Heart Room service appropriate? Yes; Fluid consistency: Thin  Diet effective now              EDUCATION NEEDS:   Not appropriate for education at this time  Skin:  Skin Assessment: Reviewed RN Assessment  Last BM:  2/3  Height: 5'2" (157.5 cm)   Weight:    Wt Readings from Last 1 Encounters:  11/16/19 43.8 kg    Ideal Body Weight:  50 kg  BMI:  Body mass index is 17.65 kg/m.  Estimated Nutritional Needs:   Kcal:  1300-1500  Protein:  65-75 grams  Fluid:  >1.3L/d  Mariana Single RD, LDN Clinical Nutrition Pager # [phone]

## 2019-11-24 NOTE — Progress Notes (Signed)
HEMATOLOGY-ONCOLOGY PROGRESS NOTE  SUBJECTIVE: Patient is awake and alert and I discussed the results of the recent liver biopsy showing metastatic breast cancer hormone receptor positive disease. She looks extremely frail and has generalized weakness.  She has extremity swelling especially in the right upper extremity.  She is complaining of right upper quadrant abdominal pain.  I also discussed with her son about her overall prognosis.  Oncology History  Breast cancer, left breast ([place])  08/25/2012 Initial Diagnosis   Breast cancer, left breast: Invasive ductal carcinoma grade 1-2 ER/PR positive HER-2 negative Ki-67 14%   10/08/2012 Surgery   Left lumpectomy and sentinel lymph node biopsy: 2.1 cm invasive ductal carcinoma 3 sentinel lymph nodes negative posterior margin reexcision ER positive PR positive HER-2 negative Ki-67 14% grade 1-2   11/10/2012 - 12/11/2012 Radiation Therapy   Adjuvant radiation therapy   12/28/2012 -  Anti-estrogen oral therapy   Arimidex 1 mg daily     OBJECTIVE: REVIEW OF SYSTEMS:   Abdominal pain 8 out of 10  PHYSICAL EXAMINATION: ECOG PERFORMANCE STATUS: 4 - Bedbound  Vitals:   11/24/19 1600 11/24/19 1614  BP:    Pulse: 88   Resp:    Temp:  (!) 97.5 F (36.4 C)  SpO2: 94%    Filed Weights   11/16/19 1244 11/16/19 2240  Weight: 97 lb 6.4 oz (44.2 kg) 96 lb 8 oz (43.8 kg)    Right upper quadrant abdominal pain and tenderness to palpation LABORATORY DATA:  I have reviewed the data as listed CMP Latest Ref Rng & Units 11/24/2019 11/23/2019 11/22/2019  Glucose 70 - 99 mg/dL 90 67(L) 77  BUN 8 - 23 mg/dL '12 11 9  ' Creatinine 0.44 - 1.00 mg/dL 1.04(H) 1.10(H) 1.11(H)  Sodium 135 - 145 mmol/L 141 140 142  Potassium 3.5 - 5.1 mmol/L 3.9 4.0 3.2(L)  Chloride 98 - 111 mmol/L 108 106 104  CO2 22 - 32 mmol/L 22 20(L) 22  Calcium 8.9 - 10.3 mg/dL 8.1(L) 8.2(L) 8.1(L)  Total Protein 6.5 - 8.1 g/dL 5.0(L) 5.5(L) 5.2(L)  Total Bilirubin 0.3 - 1.2 mg/dL 1.1  1.2 1.6(H)  Alkaline Phos 38 - 126 U/L 206(H) 234(H) 193(H)  AST 15 - 41 U/L 24 33 43(H)  ALT 0 - 44 U/L '12 14 12    ' Lab Results  Component Value Date   WBC 28.9 (H) 11/24/2019   HGB 9.7 (L) 11/24/2019   HCT 29.6 (L) 11/24/2019   MCV 88.4 11/24/2019   PLT 290 11/24/2019   NEUTROABS 24.3 (H) 11/24/2019    ASSESSMENT AND PLAN: Metastatic breast cancer hormone receptor positive disease: Liver biopsy suggested that this was metastatic breast cancer.  There is a small possibility of pancreatic cancer but given the fact that estrogen receptor is positive most likely is a breast origin. Unfortunately given the extent of disease, she has poor prognosis overall and that even hormone therapy would not cause a meaningful palliation or prolongation of life given her advanced age and her poor performance status.  Recommendation: Hospice care.  I called and discussed this with her son as well and explained to him the limitations of our treatment options and that he is in agreement with hospice.  He would like to get her home with hospice.  We will consult social worker to arrange for hospice care at home. Thank you very much for allowing [place] to participate in her care.

## 2019-11-24 NOTE — Plan of Care (Signed)
Continue to monitor

## 2019-11-24 NOTE — Progress Notes (Addendum)
PROGRESS NOTE    [name]  [id number] DOB: 1931/12/20 DOA: 11/16/2019 PCP: Lucianne Lei, MD   Brief Narrative:  HPI on 11/16/2019 by Dr. Dana Allan Patient is an 86 year old female with past medical history significant for breast cancer, hypertension, hyperlipidemia, chronic abdominal pain, rheumatoid arthritis and chronic kidney disease stage IIIA/B.  Patient was seen by the GI provider earlier today for abdominal pain.  Apparently, patient has had abdominal pain for about 2 weeks.  Patient reported constant lower abdominal pain.  There is associated poor p.o. intake and poor appetite.  Patient reported significant weight loss.  On presentation to the GI provider's office, patient was found to be hypotensive, with systolic blood pressure in the 70s.  Patient was advised to come to the hospital for further assessment and management.  Lab work done at the hospital revealed CBC of 20.2, hemoglobin of 11.1, hematocrit of 34.3 and platelet count of 563.  Chemistry revealed sodium of 134, potassium of 5.4, chloride 102, CO2 of 19, BUN of 34 with serum creatinine of 1.3 from blood sugar of 136.  INR of 1.5 and lactic acid of 3.6 were reported as well.  D-dimer was elevated.  CTA of the chest and abdomen revealed lung emphysema, bilateral lower lung lobe atelectasis, large stable soft tissue mass around the tail of pancreas and numerous liver metastasis.  Evolving splenic infarct was also queried.  No headache, no neck, no chest pain, no shortness of breath, no no nausea vomiting or change in bowel habit.  Hospitalist team will admit patient for further assessment and management. Assessment & Plan   SIRS -Unclear etiology -Patient was noted to be hypotensive, tachycardic, tachypneic with leukocytosis on admission however no infection was found -Procalcitonin was elevated from 2.9 up to 17.13 and is now trending downward -Patient was initially placed on IV antibiotics however these were  stopped by infectious disease -She was also placed on IV fluids however this was discontinued as well. -WBC is trending downward, currently 28.9 -Blood cultures and urine cultures show no growth to date -CT chest, abdomen, pelvis showed moderate severity of emphysematous lung disease.  Mild left upper lobe and bilateral lower lobe atelectasis.  Large stable soft tissue mass along the region of the pancreatic tail and splenic hilum, consistent with primary versus metastatic neoplastic disease.  Numerous stable liver lesions suggestive of liver metastasis.  Soft tissue masses within the mid to upper left abdomen and region of the splenic flexure consistent with additional neoplastic lesions.  Stable findings likely consistent with an evolving splenic infarct.  No evidence of retroperitoneal hematoma in the presence of lower abdominal pain and hypotension. -Repeat CT scanning showed a large pancreatic tail mass with invasion into the spleen.  Hepatic metastatic disease. -Patient continues to have intermittent fever  Elevated D-dimer -D-dimer was 3.14 -CTA chest PE study showed no filling defects -Lower extremity Doppler negative for DVT bilaterally -Echocardiogram showed an EF of 7075%, no LV hypertrophy.  Grade 1 diastolic dysfunction.  No regional wall motion abnormalities.  Coagulopathy -Patient was given vitamin K orally, 5 mg -Suspect secondary to abdominal pathology  Hepatitis C with cirrhosis and SVR -Status post Harvoni treatment in 2017 -Followed by hepatologist at [facility] -Was previously drinking 2 cans of beer daily however has discontinued this -Ultrasound right upper quadrant showed no ascites  Widespread metastatic disease with liver lesions -Was noted on CT scan above -Question whether this is pancreatic cancer versus recurrent breast cancer -Patient status post radiation, left breast  mastectomy as well as Arimidex -Follows with Dr. Lindi Adie, oncology -Biopsy currently pending  which was done by interventional radiology on 11/22/2019 -Per Dr. Payton Mccallum, if this is metastatic pancreatic cancer, she is extremely poor prognosis and would not offer any systemic chemotherapy, recommend hospice care.  If she has metastatic breast cancer would consider systemic treatment options -AFP 5.7, CA 19-9 less than 3 -Suspect this is a contributing patient's abdominal pain  Hypotension -Stable-patient has poor oral intake -Amlodipine held -TSH within normal limits  Lactic acidosis -Unknown etiology however lactic acid did improve from 3.2-1.9 -Possibly due to dehydration  Acute kidney injury -Possibly due to dehydration and hypotension -Creatinine peaked to 1.11, down to 1.04 today -Continue to monitor BMP  Metabolic acidosis -Resolved  Normocytic anemia -Hemoglobin dropped to 7.2, question if this was dilutional drop versus bleeding -Patient was transfused 2 units PRBC -Hemoglobin currently 9.7  Hypomagnesemia -Resolved   Hypophosphatemia -resolved  Elevated LFTs with hyperbilirubinemia -Suspect secondary to the above -resolved  DVT Prophylaxis  SCDs  Code Status: Full  Family Communication: None at bedside  Disposition Plan: Admitted. Patient from home. Presented with abdominal pain, found to have metastasis on CT scan.  Pending biopsy results.  Palliative care consulted and also pending biopsy results.  Further care and plan will be decided after more.  Dispo pending.  Consultants Gastroenterology, Dr. Tarri Glenn Oncology, Dr. Lindi Adie Interventional radiology Palliative care Infectious disease  Procedures  Liver biopsy Lower extremity Doppler Echocardiogram  Antibiotics   Anti-infectives (From admission, onward)   Start     Dose/Rate Route Frequency Ordered Stop   11/20/19 1200  piperacillin-tazobactam (ZOSYN) IVPB 3.375 g  Status:  Discontinued     3.375 g 12.5 mL/hr over 240 Minutes Intravenous Every 8 hours 11/20/19 1134 11/23/19 0946   11/16/19  2030  ceFEPIme (MAXIPIME) 2 g in sodium chloride 0.9 % 100 mL IVPB  Status:  Discontinued     2 g 200 mL/hr over 30 Minutes Intravenous Every 24 hours 11/16/19 2017 11/20/19 1130   11/16/19 2030  vancomycin (VANCOREADY) IVPB 750 mg/150 mL  Status:  Discontinued     750 mg 150 mL/hr over 60 Minutes Intravenous Every 48 hours 11/16/19 2017 11/20/19 1130   11/16/19 1500  piperacillin-tazobactam (ZOSYN) IVPB 3.375 g     3.375 g 100 mL/hr over 30 Minutes Intravenous  Once 11/16/19 1456 11/16/19 1637      Subjective:   [name] seen and examined today.  Feels cold and shaky this morning.  Continues to have abdominal pain.  Denies any nausea or vomiting.  Denies chest pain or shortness of breath.  States she feels very weak.  Denies current dizziness or headache.  Objective:   Vitals:   11/24/19 1130 11/24/19 1145 11/24/19 1345 11/24/19 1400  BP:      Pulse: 92 85 88 89  Resp: 19 20 18 18   Temp:      TempSrc:      SpO2: 97% 95% 97% 97%  Weight:      Height:        Intake/Output Summary (Last 24 hours) at 11/24/2019 1426 Last data filed at 11/24/2019 1000 Gross per 24 hour  Intake 270 ml  Output 300 ml  Net -30 ml   Filed Weights   11/16/19 1244 11/16/19 2240  Weight: 44.2 kg 43.8 kg    Exam  General: Well developed, ill-appearing, elderly, NAD  HEENT: NCAT,  mucous membranes moist.   Cardiovascular: S1 S2 auscultated, RRR  Respiratory:  Diminished breath sounds  Abdomen: Soft, mildly TTP, nondistended, + bowel sounds  Extremities: warm dry without cyanosis clubbing or edema  Neuro: AAOx3, nonfocal  Psych: Flat   Data Reviewed: I have personally reviewed following labs and imaging studies  CBC: Recent Labs  Lab 11/20/19 0314 11/21/19 0941 11/22/19 0650 11/23/19 0450 11/24/19 0245  WBC 21.6* 36.8* 40.6* 35.7* 28.9*  NEUTROABS 17.7* 32.6* 36.6* 31.6* 24.3*  HGB 11.1* 10.2* 10.7* 10.3* 9.7*  HCT 31.7* 31.1* 33.1* 31.6* 29.6*  MCV 84.5 89.1 89.7  89.5 88.4  PLT 346 296 313 305 [id number]   Basic Metabolic Panel: Recent Labs  Lab 11/20/19 0314 11/20/19 0314 11/21/19 0321 11/21/19 0941 11/22/19 0650 11/23/19 0450 11/24/19 0245  NA 136  --  137  --  142 140 141  K 3.1*  --  3.8  --  3.2* 4.0 3.9  CL 101  --  100  --  104 106 108  CO2 24  --  26  --  22 20* 22  GLUCOSE 105*  --  98  --  77 67* 90  BUN 9  --  9  --  9 11 12   CREATININE 0.75  --  1.04*  --  1.11* 1.10* 1.04*  CALCIUM 7.7*  --  7.7*  --  8.1* 8.2* 8.1*  MG 2.3  --   --  2.0 1.8 2.0 2.0  PHOS 2.5   < > 2.1* 2.0* 2.9 2.8 3.1   < > = values in this interval not displayed.   GFR: Estimated Creatinine Clearance: 27.3 mL/min (A) (by C-G formula based on SCr of 1.04 mg/dL (H)). Liver Function Tests: Recent Labs  Lab 11/20/19 0314 11/21/19 1350 11/22/19 0650 11/23/19 0450 11/24/19 0245  AST 20 35 43* 33 24  ALT 13 13 12 14 12   ALKPHOS 124 147* 193* 234* 206*  BILITOT 0.9 0.8 1.6* 1.2 1.1  PROT 5.4* 5.0* 5.2* 5.5* 5.0*  ALBUMIN 1.8* 1.6* 1.6* 1.5* 1.4*   No results for input(s): LIPASE, AMYLASE in the last 168 hours. No results for input(s): AMMONIA in the last 168 hours. Coagulation Profile: Recent Labs  Lab 11/18/19 1222  INR 1.3*   Cardiac Enzymes: No results for input(s): CKTOTAL, CKMB, CKMBINDEX, TROPONINI in the last 168 hours. BNP (last 3 results) No results for input(s): PROBNP in the last 8760 hours. HbA1C: No results for input(s): HGBA1C in the last 72 hours. CBG: Recent Labs  Lab 11/19/19 0241 11/23/19 1537  GLUCAP 142* 90   Lipid Profile: No results for input(s): CHOL, HDL, LDLCALC, TRIG, CHOLHDL, LDLDIRECT in the last 72 hours. Thyroid Function Tests: No results for input(s): TSH, T4TOTAL, FREET4, T3FREE, THYROIDAB in the last 72 hours. Anemia Panel: No results for input(s): VITAMINB12, FOLATE, FERRITIN, TIBC, IRON, RETICCTPCT in the last 72 hours. Urine analysis:    Component Value Date/Time   COLORURINE STRAW (A) 11/17/2019 0504    APPEARANCEUR CLEAR 11/17/2019 0504   LABSPEC 1.020 11/17/2019 0504   PHURINE 5.0 11/17/2019 0504   GLUCOSEU NEGATIVE 11/17/2019 0504   HGBUR NEGATIVE 11/17/2019 0504   BILIRUBINUR NEGATIVE 11/17/2019 0504   KETONESUR NEGATIVE 11/17/2019 0504   PROTEINUR NEGATIVE 11/17/2019 0504   UROBILINOGEN 0.2 05/15/2015 1542   NITRITE NEGATIVE 11/17/2019 0504   LEUKOCYTESUR NEGATIVE 11/17/2019 0504   Sepsis Labs: @LABRCNTIP (procalcitonin:4,lacticidven:4)  ) Recent Results (from the past 240 hour(s))  SARS CORONAVIRUS 2 (TAT 6-24 HRS) Nasopharyngeal Nasopharyngeal Swab     Status: None   Collection Time: 11/16/19  1:32 PM   Specimen: Nasopharyngeal Swab  Result Value Ref Range Status   SARS Coronavirus 2 NEGATIVE NEGATIVE Final    Comment: (NOTE) SARS-CoV-2 target nucleic acids are NOT DETECTED. The SARS-CoV-2 RNA is generally detectable in upper and lower respiratory specimens during the acute phase of infection. Negative results do not preclude SARS-CoV-2 infection, do not rule out co-infections with other pathogens, and should not be used as the sole basis for treatment or other patient management decisions. Negative results must be combined with clinical observations, patient history, and epidemiological information. The expected result is Negative. Fact Sheet for Patients: SugarRoll.be Fact Sheet for Healthcare Providers: https://www.woods-mathews.com/ This test is not yet approved or cleared by the [place] FDA and  has been authorized for detection and/or diagnosis of SARS-CoV-2 by FDA under an Emergency Use Authorization (EUA). This EUA will remain  in effect (meaning this test can be used) for the duration of the COVID-19 declaration under Section 56 4(b)(1) of the Act, 21 U.S.C. section 360bbb-3(b)(1), unless the authorization is terminated or revoked sooner. Performed at [facility], [place] [address], [place],  [place] [address]   Culture, blood (routine x 2)     Status: None   Collection Time: 11/16/19  2:11 PM   Specimen: BLOOD  Result Value Ref Range Status   Specimen Description BLOOD RIGHT ANTECUBITAL  Final   Special Requests   Final    BOTTLES DRAWN AEROBIC AND ANAEROBIC Blood Culture adequate volume   Culture   Final    NO GROWTH 5 DAYS Performed at [facility], [place] [address], [place], [place] [address]    Report Status 11/21/2019 FINAL  Final  Culture, blood (routine x 2)     Status: None   Collection Time: 11/16/19  2:26 PM   Specimen: BLOOD  Result Value Ref Range Status   Specimen Description BLOOD RIGHT ANTECUBITAL  Final   Special Requests   Final    BOTTLES DRAWN AEROBIC AND ANAEROBIC Blood Culture adequate volume   Culture   Final    NO GROWTH 5 DAYS Performed at [facility], [place] [address], [place], [place] [address]    Report Status 11/21/2019 FINAL  Final  Urine culture     Status: None   Collection Time: 11/17/19  5:16 AM   Specimen: Urine, Random  Result Value Ref Range Status   Specimen Description URINE, RANDOM  Final   Special Requests NONE  Final   Culture   Final    NO GROWTH Performed at [facility], [place] [address], [place], [place] [address]    Report Status 11/17/2019 FINAL  Final  MRSA PCR Screening     Status: None   Collection Time: 11/18/19  6:00 PM   Specimen: Nasal Mucosa; Nasopharyngeal  Result Value Ref Range Status   MRSA by PCR NEGATIVE NEGATIVE Final    Comment:        The GeneXpert MRSA Assay (FDA approved for NASAL specimens only), is one component of a comprehensive MRSA colonization surveillance program. It is not intended to diagnose MRSA infection nor to guide or monitor treatment for MRSA infections. Performed at [facility], [place] [address], [place], [place] [address]   Culture, blood (routine x 2)     Status: None   Collection Time: 11/18/19  6:38 PM   Specimen: BLOOD RIGHT WRIST  Result Value Ref  Range Status   Specimen Description BLOOD RIGHT WRIST  Final   Special Requests   Final    BOTTLES DRAWN AEROBIC AND ANAEROBIC Blood Culture results may not be optimal due to an inadequate volume of blood received in culture bottles Performed at [place] [address], [place], [place] [address]    Culture NO GROWTH 5 DAYS  Final   Report Status 11/23/2019 FINAL  Final  Culture, blood (routine x 2)     Status: None   Collection Time: 11/18/19  6:42 PM   Specimen: BLOOD RIGHT WRIST  Result Value Ref Range Status   Specimen Description BLOOD RIGHT WRIST  Final   Special Requests   Final    BOTTLES DRAWN AEROBIC AND ANAEROBIC Blood Culture adequate volume Performed at [facility], [place] [address] [place], [place] [address]    Culture NO GROWTH 5 DAYS  Final   Report Status 11/23/2019 FINAL  Final      Radiology Studies: CT ABDOMEN PELVIS W CONTRAST  Result Date: 11/22/2019 CLINICAL DATA:  86 year old female with abdominal pain. History of breast cancer and pancreatic mass. EXAM: CT ABDOMEN AND PELVIS WITH CONTRAST TECHNIQUE: Multidetector CT imaging of the abdomen and pelvis was performed using the standard protocol following bolus administration of intravenous contrast. CONTRAST:  34mL OMNIPAQUE IOHEXOL 300 MG/ML  SOLN COMPARISON:  CT abdomen pelvis dated 11/16/2019. FINDINGS: Lower chest: Partially visualized small to moderate bilateral pleural effusions, new since the prior CT. There are consolidative changes of the lower lobes with air bronchograms which may represent atelectasis or infiltrate. Clinical correlation is recommended. There is emphysematous changes of the visualized lung. Advanced 3 vessel coronary vascular calcification as well as atherosclerotic calcification of the ascending aorta. No intra-abdominal free air. There is a small free fluid within the pelvis as well as small perisplenic free fluid, new since the prior CT. Hepatobiliary: Multiple (greater than  10) hepatic hypoenhancing masses measuring up to 4.7 cm in the left lobe of the liver consistent with metastatic disease. No intrahepatic biliary ductal dilatation. No calcified gallstone or pericholecystic fluid. Pancreas: There is a 7.6 x 5.2 x 5.0 cm lobulated mass with areas of decreased enhancement or necrosis arising from the tail of the pancreas and invading into the spleen. There is also extension of the mass to the left lateral peritoneal wall. This is consistent with malignancy. Spleen: Heterogeneous enhancement of the spleen with areas of decreased enhancement likely combination of tumoral invasion from the tail of the pancreas as well as areas of splenic infarct. Adrenals/Urinary Tract: The right adrenal gland is unremarkable. There is abutment of the left adrenal gland by the pancreatic tail mass. There is no hydronephrosis on either side. There is symmetric enhancement and excretion of contrast by both kidneys. Probable 1 cm right renal upper pole cyst. The urinary bladder is grossly unremarkable. Stomach/Bowel: There is loose stool throughout the colon compatible with diarrheal state. Correlation with clinical exam and stool cultures recommended. There is loss of fat plane between the pancreatic tail mass and inferior posterior greater curvature of the stomach (series 3, image 25 and axial series 6, image 68). There is also loss of fat plane between the pancreatic mass and splenic flexure of the colon. There is no bowel obstruction or active inflammation. Appendectomy. Vascular/Lymphatic: Advanced aortoiliac atherosclerotic disease. The IVC is unremarkable. No portal venous gas. No adenopathy. Reproductive: Hysterectomy. Other: A 3 x 3 cm enhancing lesion in the left anterior peritoneum consistent with metastatic disease. Musculoskeletal: Degenerative changes of the spine. No acute  osseous pathology. Diffuse subcutaneous edema. IMPRESSION: 1. Large pancreatic tail mass with invasion into the spleen.  2. Hypoenhancing splenic tissue likely combination of tumoral infiltration and splenic infarcts. 3. Hepatic metastatic disease. 4. Abutment of the posterior greater curvature of the stomach as well as splenic flexure of the colon by the pancreatic mass. 5. Metastatic disease/implant in the left upper abdomen. 6. Diarrheal state. Clinical correlation is recommended. No bowel obstruction. 7. Interval development of small to moderate bilateral pleural effusions and bilateral lower lobe atelectasis or infiltrate. Interval development of small ascites. 8. Aortic Atherosclerosis (ICD10-I70.0) and Emphysema (ICD10-J43.9). Electronically Signed   By: Anner Crete M.D.   On: 11/22/2019 20:31   US BIOPSY (LIVER)  Result Date: 11/22/2019 INDICATION: 86 year old female with a pancreatic tail mass and multiple liver lesions concerning for metastatic disease. She presents for ultrasound-guided core biopsy to establish tissue diagnosis. EXAM: ULTRASOUND BIOPSY CORE LIVER MEDICATIONS: None. ANESTHESIA/SEDATION: 0.25 mg Versed were a given for anxiolysis. This does not constitute moderate sedation. FLUOROSCOPY TIME:  None. COMPLICATIONS: None immediate. PROCEDURE: Informed written consent was obtained from the patient after a thorough discussion of the procedural risks, benefits and alternatives. All questions were addressed. A timeout was performed prior to the initiation of the procedure. The liver was interrogated with ultrasound. Multiple echogenic masslike areas are successfully identified. A suitable lesion was selected. A skin entry site was marked. The overlying skin was sterilely prepped and draped in the standard fashion using chlorhexidine skin prep. Local anesthesia was attained by infiltration with 1% lidocaine. A small dermatotomy was made. Under real-time ultrasound guidance, a 17 gauge introducer needle was advanced through the liver and positioned at the margin of the mass. Multiple 18 gauge core biopsies  were then coaxially obtained using the bio Pince automated biopsy device. Biopsy specimens were placed in formalin and delivered to pathology for further analysis. As the introducer needle was removed, the biopsy tract was embolized with a Gel-Foam slurry. Overall, the patient tolerated the procedure well. There was no immediate complication. IMPRESSION: Technically successful ultrasound-guided core biopsy of liver lesion. Electronically Signed   By: Jacqulynn Cadet M.D.   On: 11/22/2019 16:33   DG CHEST PORT 1 VIEW  Result Date: 11/24/2019 CLINICAL DATA:  Shortness of breath. EXAM: PORTABLE CHEST 1 VIEW COMPARISON:  11/22/2019 FINDINGS: The cardiac silhouette, mediastinal and hilar contours are within normal limits and stable. Stable tortuosity and dense calcification of the thoracic aorta. Interval development of significant left lower lobe atelectasis likely due to mucous plugging. Stable surgical changes involving the left lower lobe. Underlying significant chronic lung disease with persistent superimposed right basilar atelectasis and bilateral pleural effusions. IMPRESSION: Interval significant progression of left lower lobe atelectasis likely due to mucous plugging. Electronically Signed   By: Marijo Sanes M.D.   On: 11/24/2019 08:28     Scheduled Meds: Continuous Infusions:   LOS: 8 days   Time Spent in minutes   30 minutes  Srinidhi Landers D.O. on 11/24/2019 at 2:26 PM  Between 7am to 7pm - Please see pager noted on [email or web address]  After 7pm go to [email or web address]  And look for the night coverage person covering for me after hours  [facility] Office  [phone]

## 2019-11-25 NOTE — Progress Notes (Signed)
PROGRESS NOTE    [name]  [id number] DOB: [birth year] DOA: 11/16/2019 PCP: Lucianne Lei, MD   Brief Narrative:  HPI on 11/16/2019 by Dr. Dana Allan Patient is an 86 year old female with past medical history significant for breast cancer, hypertension, hyperlipidemia, chronic abdominal pain, rheumatoid arthritis and chronic kidney disease stage IIIA/B.  Patient was seen by the GI provider earlier today for abdominal pain.  Apparently, patient has had abdominal pain for about 2 weeks.  Patient reported constant lower abdominal pain.  There is associated poor p.o. intake and poor appetite.  Patient reported significant weight loss.  On presentation to the GI provider's office, patient was found to be hypotensive, with systolic blood pressure in the 70s.  Patient was advised to come to the hospital for further assessment and management.  Lab work done at the hospital revealed CBC of 20.2, hemoglobin of 11.1, hematocrit of 34.3 and platelet count of 563.  Chemistry revealed sodium of 134, potassium of 5.4, chloride 102, CO2 of 19, BUN of 34 with serum creatinine of 1.3 from blood sugar of 136.  INR of 1.5 and lactic acid of 3.6 were reported as well.  D-dimer was elevated.  CTA of the chest and abdomen revealed lung emphysema, bilateral lower lung lobe atelectasis, large stable soft tissue mass around the tail of pancreas and numerous liver metastasis.  Evolving splenic infarct was also queried.  No headache, no neck, no chest pain, no shortness of breath, no no nausea vomiting or change in bowel habit.  Hospitalist team will admit patient for further assessment and management.  Interim history Admitted with SIRS, thought to have sepsis however no infection found. Was placed on antibiotics, IVF, however discontinued.  Patient did have a biopsy done, showing metastatic breast cancer.  Patient continues to spike fever- however likely due to metastatic cancer.  The plan is for home hospice.   Palliative care consulted and appreciated.  Assessment & Plan   SIRS -Unclear etiology -Patient was noted to be hypotensive, tachycardic, tachypneic with leukocytosis on admission however no infection was found -Procalcitonin was elevated from 2.9 up to 17.13 and is now trending downward -Patient was initially placed on IV antibiotics however these were stopped by infectious disease -She was also placed on IV fluids however this was discontinued as well. -WBC is trending downward, currently 28.9 -Blood cultures and urine cultures show no growth to date -CT chest, abdomen, pelvis showed moderate severity of emphysematous lung disease.  Mild left upper lobe and bilateral lower lobe atelectasis.  Large stable soft tissue mass along the region of the pancreatic tail and splenic hilum, consistent with primary versus metastatic neoplastic disease.  Numerous stable liver lesions suggestive of liver metastasis.  Soft tissue masses within the mid to upper left abdomen and region of the splenic flexure consistent with additional neoplastic lesions.  Stable findings likely consistent with an evolving splenic infarct.  No evidence of retroperitoneal hematoma in the presence of lower abdominal pain and hypotension. -Repeat CT scanning showed a large pancreatic tail mass with invasion into the spleen.  Hepatic metastatic disease. -Patient continues to have intermittent fever  Elevated D-dimer -D-dimer was 3.14 -CTA chest PE study showed no filling defects -Lower extremity Doppler negative for DVT bilaterally -Echocardiogram showed an EF of 70-75%, no LV hypertrophy.  Grade 1 diastolic dysfunction.  No regional wall motion abnormalities.  Coagulopathy -Patient was given vitamin K orally, 5 mg -Suspect secondary to abdominal pathology  Hepatitis C with cirrhosis and SVR -Status  post Harvoni treatment in 2017 -Followed by hepatologist at [facility] -Was previously drinking 2 cans of beer daily however has  discontinued this -Ultrasound right upper quadrant showed no ascites  Widespread metastatic disease with liver lesions -Was noted on CT scan above -Question whether this is pancreatic cancer versus recurrent breast cancer -Patient status post radiation, left breast mastectomy as well as Arimidex -Follows with Dr. Lindi Adie, oncology -Biopsy currently pending which was done by interventional radiology on 11/22/2019- metastatic breast cancer -AFP 5.7, CA 19-9 less than 3 -Suspect this is a contributing patient's abdominal pain -Discussed with Dr. Lindi Adie, patient's overall prognosis is poor.  Recommends hospice care.  Hypotension -Stable-patient has poor oral intake -Amlodipine held -TSH within normal limits  Lactic acidosis -Unknown etiology however lactic acid did improve from 3.2-1.9 -Possibly due to dehydration  Acute kidney injury -Possibly due to dehydration and hypotension -Creatinine peaked to 1.11, down to 1.04  -Continue to monitor BMP  Metabolic acidosis -Resolved  Normocytic anemia -Hemoglobin dropped to 7.2, question if this was dilutional drop versus bleeding -Patient was transfused 2 units PRBC -Hemoglobin 9.7  Hypomagnesemia -Resolved   Hypophosphatemia -resolved  Elevated LFTs with hyperbilirubinemia -Suspect secondary to the above -resolved  Goals of care -Given biopsy results, patient noted to have metastatic breast cancer.  Discussed with oncology, patient's overall prognosis is poor.  Recommended home hospice. -Palliative care consulted and appreciated -Attempted to discuss CODE STATUS with patient, however patient very guarded this morning.  DVT Prophylaxis  SCDs  Code Status: Full  Family Communication: None at bedside  Disposition Plan: Admitted. Patient from home. Presented with abdominal pain, found to have metastasis on CT scan.  Biopsy showed metastatic cancer.  Oncology recommended home hospice.  Palliative care consultation pending.   Suspect home with hospice on 11/26/2019 when arrangements can be made.  Consultants Gastroenterology, Dr. Tarri Glenn Oncology, Dr. Lindi Adie Interventional radiology Palliative care Infectious disease  Procedures  Liver biopsy Lower extremity Doppler Echocardiogram  Antibiotics   Anti-infectives (From admission, onward)   Start     Dose/Rate Route Frequency Ordered Stop   11/20/19 1200  piperacillin-tazobactam (ZOSYN) IVPB 3.375 g  Status:  Discontinued     3.375 g 12.5 mL/hr over 240 Minutes Intravenous Every 8 hours 11/20/19 1134 11/23/19 0946   11/16/19 2030  ceFEPIme (MAXIPIME) 2 g in sodium chloride 0.9 % 100 mL IVPB  Status:  Discontinued     2 g 200 mL/hr over 30 Minutes Intravenous Every 24 hours 11/16/19 2017 11/20/19 1130   11/16/19 2030  vancomycin (VANCOREADY) IVPB 750 mg/150 mL  Status:  Discontinued     750 mg 150 mL/hr over 60 Minutes Intravenous Every 48 hours 11/16/19 2017 11/20/19 1130   11/16/19 1500  piperacillin-tazobactam (ZOSYN) IVPB 3.375 g     3.375 g 100 mL/hr over 30 Minutes Intravenous  Once 11/16/19 1456 11/16/19 1637      Subjective:   Emmaline Kluver seen and examined today.  Feels tired. Does not feel like speaking much this morning. Denies current chest pain, shortness of breath. Feels weak. Continues to have abdominal pain.   Objective:   Vitals:   11/25/19 0026 11/25/19 0130 11/25/19 0555 11/25/19 0815  BP: (!) 149/64  (!) 110/45 (!) 118/51  Pulse: (!) 103  83 86  Resp: 16   18  Temp: (!) 101.6 F (38.7 C) 99.9 F (37.7 C) 98.8 F (37.1 C) 98.7 F (37.1 C)  TempSrc: Oral Oral Oral Oral  SpO2: 93%  95% 93%  Weight:      Height:       No intake or output data in the 24 hours ending 11/25/19 1106 Filed Weights   11/16/19 1244 11/16/19 2240  Weight: 44.2 kg 43.8 kg   Exam  General: Well developed, chronically ill appearing, elderly, NAD  HEENT: NCAT, PERRLA, EOMI, Anicteic Sclera, mucous membranes moist.   Cardiovascular: S1 S2  auscultated, RRR  Respiratory: Diminished breath sounds  Abdomen: Soft, mildly TTP, nondistended, + bowel sounds  Extremities: warm dry without cyanosis clubbing or edema  Neuro: AAOx3, nonfocal  Psych: Flat  Data Reviewed: I have personally reviewed following labs and imaging studies  CBC: Recent Labs  Lab 11/20/19 0314 11/21/19 0941 11/22/19 0650 11/23/19 0450 11/24/19 0245  WBC 21.6* 36.8* 40.6* 35.7* 28.9*  NEUTROABS 17.7* 32.6* 36.6* 31.6* 24.3*  HGB 11.1* 10.2* 10.7* 10.3* 9.7*  HCT 31.7* 31.1* 33.1* 31.6* 29.6*  MCV 84.5 89.1 89.7 89.5 88.4  PLT 346 296 313 305 [id number]   Basic Metabolic Panel: Recent Labs  Lab 11/20/19 0314 11/20/19 0314 11/21/19 0321 11/21/19 0941 11/22/19 0650 11/23/19 0450 11/24/19 0245  NA 136  --  137  --  142 140 141  K 3.1*  --  3.8  --  3.2* 4.0 3.9  CL 101  --  100  --  104 106 108  CO2 24  --  26  --  22 20* 22  GLUCOSE 105*  --  98  --  77 67* 90  BUN 9  --  9  --  9 11 12   CREATININE 0.75  --  1.04*  --  1.11* 1.10* 1.04*  CALCIUM 7.7*  --  7.7*  --  8.1* 8.2* 8.1*  MG 2.3  --   --  2.0 1.8 2.0 2.0  PHOS 2.5   < > 2.1* 2.0* 2.9 2.8 3.1   < > = values in this interval not displayed.   GFR: Estimated Creatinine Clearance: 27.3 mL/min (A) (by C-G formula based on SCr of 1.04 mg/dL (H)). Liver Function Tests: Recent Labs  Lab 11/20/19 0314 11/21/19 1350 11/22/19 0650 11/23/19 0450 11/24/19 0245  AST 20 35 43* 33 24  ALT 13 13 12 14 12   ALKPHOS 124 147* 193* 234* 206*  BILITOT 0.9 0.8 1.6* 1.2 1.1  PROT 5.4* 5.0* 5.2* 5.5* 5.0*  ALBUMIN 1.8* 1.6* 1.6* 1.5* 1.4*   No results for input(s): LIPASE, AMYLASE in the last 168 hours. No results for input(s): AMMONIA in the last 168 hours. Coagulation Profile: Recent Labs  Lab 11/18/19 1222  INR 1.3*   Cardiac Enzymes: No results for input(s): CKTOTAL, CKMB, CKMBINDEX, TROPONINI in the last 168 hours. BNP (last 3 results) No results for input(s): PROBNP in the last 8760  hours. HbA1C: No results for input(s): HGBA1C in the last 72 hours. CBG: Recent Labs  Lab 11/19/19 0241 11/23/19 1537  GLUCAP 142* 90   Lipid Profile: No results for input(s): CHOL, HDL, LDLCALC, TRIG, CHOLHDL, LDLDIRECT in the last 72 hours. Thyroid Function Tests: No results for input(s): TSH, T4TOTAL, FREET4, T3FREE, THYROIDAB in the last 72 hours. Anemia Panel: No results for input(s): VITAMINB12, FOLATE, FERRITIN, TIBC, IRON, RETICCTPCT in the last 72 hours. Urine analysis:    Component Value Date/Time   COLORURINE STRAW (A) 11/17/2019 0504   APPEARANCEUR CLEAR 11/17/2019 0504   LABSPEC 1.020 11/17/2019 0504   PHURINE 5.0 11/17/2019 0504   GLUCOSEU NEGATIVE 11/17/2019 0504   HGBUR NEGATIVE 11/17/2019 0504  BILIRUBINUR NEGATIVE 11/17/2019 [place] 11/17/2019 0504   PROTEINUR NEGATIVE 11/17/2019 0504   UROBILINOGEN 0.2 05/15/2015 1542   NITRITE NEGATIVE 11/17/2019 0504   LEUKOCYTESUR NEGATIVE 11/17/2019 0504   Sepsis Labs: @LABRCNTIP (procalcitonin:4,lacticidven:4)  ) Recent Results (from the past 240 hour(s))  SARS CORONAVIRUS 2 (TAT 6-24 HRS) Nasopharyngeal Nasopharyngeal Swab     Status: None   Collection Time: 11/16/19  1:32 PM   Specimen: Nasopharyngeal Swab  Result Value Ref Range Status   SARS Coronavirus 2 NEGATIVE NEGATIVE Final    Comment: (NOTE) SARS-CoV-2 target nucleic acids are NOT DETECTED. The SARS-CoV-2 RNA is generally detectable in upper and lower respiratory specimens during the acute phase of infection. Negative results do not preclude SARS-CoV-2 infection, do not rule out co-infections with other pathogens, and should not be used as the sole basis for treatment or other patient management decisions. Negative results must be combined with clinical observations, patient history, and epidemiological information. The expected result is Negative. Fact Sheet for Patients: SugarRoll.be Fact Sheet for  Healthcare Providers: https://www.woods-mathews.com/ This test is not yet approved or cleared by the [place] FDA and  has been authorized for detection and/or diagnosis of SARS-CoV-2 by FDA under an Emergency Use Authorization (EUA). This EUA will remain  in effect (meaning this test can be used) for the duration of the COVID-19 declaration under Section 56 4(b)(1) of the Act, 21 U.S.C. section 360bbb-3(b)(1), unless the authorization is terminated or revoked sooner. Performed at [facility], [place] [address], [place], [place] [address]   Culture, blood (routine x 2)     Status: None   Collection Time: 11/16/19  2:11 PM   Specimen: BLOOD  Result Value Ref Range Status   Specimen Description BLOOD RIGHT ANTECUBITAL  Final   Special Requests   Final    BOTTLES DRAWN AEROBIC AND ANAEROBIC Blood Culture adequate volume   Culture   Final    NO GROWTH 5 DAYS Performed at [facility], [place] [address], [place], [place] [address]    Report Status 11/21/2019 FINAL  Final  Culture, blood (routine x 2)     Status: None   Collection Time: 11/16/19  2:26 PM   Specimen: BLOOD  Result Value Ref Range Status   Specimen Description BLOOD RIGHT ANTECUBITAL  Final   Special Requests   Final    BOTTLES DRAWN AEROBIC AND ANAEROBIC Blood Culture adequate volume   Culture   Final    NO GROWTH 5 DAYS Performed at [facility], [place] [address], [place], [place] [address]    Report Status 11/21/2019 FINAL  Final  Urine culture     Status: None   Collection Time: 11/17/19  5:16 AM   Specimen: Urine, Random  Result Value Ref Range Status   Specimen Description URINE, RANDOM  Final   Special Requests NONE  Final   Culture   Final    NO GROWTH Performed at [facility], [place] [address], [place], [place] [address]    Report Status 11/17/2019 FINAL  Final  MRSA PCR Screening     Status: None   Collection Time: 11/18/19  6:00 PM   Specimen: Nasal Mucosa;  Nasopharyngeal  Result Value Ref Range Status   MRSA by PCR NEGATIVE NEGATIVE Final    Comment:        The GeneXpert MRSA Assay (FDA approved for NASAL specimens only), is one component of a comprehensive MRSA colonization surveillance program. It  is not intended to diagnose MRSA infection nor to guide or monitor treatment for MRSA infections. Performed at [facility], [place] [address], [place], [place] [address]   Culture, blood (routine x 2)     Status: None   Collection Time: 11/18/19  6:38 PM   Specimen: BLOOD RIGHT WRIST  Result Value Ref Range Status   Specimen Description BLOOD RIGHT WRIST  Final   Special Requests   Final    BOTTLES DRAWN AEROBIC AND ANAEROBIC Blood Culture results may not be optimal due to an inadequate volume of blood received in culture bottles Performed at [facility], [place] [address], [place], [place] [address]    Culture NO GROWTH 5 DAYS  Final   Report Status 11/23/2019 FINAL  Final  Culture, blood (routine x 2)     Status: None   Collection Time: 11/18/19  6:42 PM   Specimen: BLOOD RIGHT WRIST  Result Value Ref Range Status   Specimen Description BLOOD RIGHT WRIST  Final   Special Requests   Final    BOTTLES DRAWN AEROBIC AND ANAEROBIC Blood Culture adequate volume Performed at [facility], [place] [address] [place], [place] [address]    Culture NO GROWTH 5 DAYS  Final   Report Status 11/23/2019 FINAL  Final      Radiology Studies: DG CHEST PORT 1 VIEW  Result Date: 11/24/2019 CLINICAL DATA:  Shortness of breath. EXAM: PORTABLE CHEST 1 VIEW COMPARISON:  11/22/2019 FINDINGS: The cardiac silhouette, mediastinal and hilar contours are within normal limits and stable. Stable tortuosity and dense calcification of the thoracic aorta. Interval development of significant left lower lobe atelectasis likely due to mucous plugging. Stable surgical changes involving the left lower lobe. Underlying significant chronic lung disease with  persistent superimposed right basilar atelectasis and bilateral pleural effusions. IMPRESSION: Interval significant progression of left lower lobe atelectasis likely due to mucous plugging. Electronically Signed   By: Marijo Sanes M.D.   On: 11/24/2019 08:28     Scheduled Meds: . feeding supplement (ENSURE ENLIVE)  237 mL Oral TID BM   Continuous Infusions:   LOS: 9 days   Time Spent in minutes   30 minutes  Janice Seales D.O. on 11/25/2019 at 11:06 AM  Between 7am to 7pm - Please see pager noted on [email or web address]  After 7pm go to [email or web address]  And look for the night coverage person covering for me after hours  [facility] Office  [phone]

## 2019-11-25 NOTE — TOC Initial Note (Signed)
Transition of Care (TOC) - Initial/Assessment Note  Marvetta Gibbons RN, BSN Transitions of Care Unit 4E- RN Case Manager [phone]   Patient Details  Name: [name] MRN: [id number] Date of Birth: 06-03-1932  Transition of Care [facility]) CM/SW Contact:    Dawayne Patricia, RN Phone Number: 11/25/2019, 12:54 PM  Clinical Narrative:                 Referral received for home hospice needs- call made to patient's son- Henri [phone] to discuss home transition plans- per son- he wants to bring mom home with hospice- choice offered Per CMS guidelines from medicare.gov website with star ratings (copy placed in shadow chart)- per son he would like to use [facility] for home hospice needs- discussed possible DME needs - wants to wait on getting hospital bed, pt will need home 02 and BSC, has RW already. Also discussed transport home needs- son would like to transport via non-emergent EMS- [facility]. Address confirmed with son in epic- PCP- Dr. Criss Rosales. - per son he works until 3:30pm will plan to d/c pt in the afternoon after son gets off of work so that he can be at home. - Call made to [name] with [facility] for home hospice referral- referral pending hospice eligibility confirmation. She will f/u with son and make DME arrangements- once Hospice services confirmed and DME has been delivered to home pt may be discharge- will need GOLD DNR for transport- TOC will f/u tomorrow to finalize hospice arrangements.     Barriers to Discharge: Other (comment)(waiting on hospice set up- and DME delivery)   Patient Goals and CMS Choice Patient states their goals for this hospitalization and ongoing recovery are:: return home with hospice CMS Medicare.gov Compare Post Acute Care list provided to:: Patient Represenative (must comment)(son) Choice offered to / list presented to : Adult Children  Expected Discharge Plan and Services   In-house Referral: Hospice / Palliative  Care Discharge Planning Services: CM Consult Post Acute Care Choice: Durable Medical Equipment, Hospice Living arrangements for the past 2 months: Single Family Home                 DME Arranged: Oxygen, Hospice Equipment Package Others DME Agency: [facility] [place] Date DME Agency Contacted: 11/25/19 Time DME Agency Contacted: 1252 Representative spoke with at DME Agency: [place] Arranged: [facility] [name] Agency: [facility] [place] Date [name]: 11/25/19 Time [name]: 1252 Representative spoke with at [place]: [name]  Prior Living Arrangements/Services Living arrangements for the past 2 months: [place] with:: Self, Adult Children Patient language and need for interpreter reviewed:: Yes Do you feel safe going back to the place where you live?: Yes      Need for Family Participation in Patient Care: Yes (Comment) Care giver support system in place?: Yes (comment) Current home services: DME(walker) Criminal Activity/Legal Involvement Pertinent to Current Situation/Hospitalization: No - Comment as needed  Activities of Daily Living Home Assistive Devices/Equipment: Eyeglasses, Dentures (specify type)(upper and lowers) ADL Screening (condition at time of admission) Patient's cognitive ability adequate to safely complete daily activities?: Yes Is the patient deaf or have difficulty hearing?: No Does the patient have difficulty seeing, even when wearing glasses/contacts?: No Does the patient have difficulty concentrating, remembering, or making decisions?: No Patient able to express need for assistance with ADLs?: Yes Does the patient have difficulty dressing or bathing?: No Independently performs ADLs?:  Yes (appropriate for developmental age) Does the patient have difficulty walking or climbing stairs?: Yes Weakness of Legs: Both Weakness of Arms/Hands: Both  Permission  Sought/Granted Permission sought to share information with : [facility] administrator granted to share info w AGENCY: [facility]        Emotional Assessment Appearance:: Appears stated age   Affect (typically observed): Quiet Orientation: : Oriented to Self, Oriented to Place, Oriented to  Time, Oriented to Situation   Psych Involvement: No (comment)  Admission diagnosis:  Liver metastases ([place]) [C78.7] Splenic mass [R16.1] Sepsis ([place]) [A41.9] Sepsis, due to unspecified organism, unspecified whether acute organ dysfunction present [facility]) [A41.9] Patient Active Problem List   Diagnosis Date Noted  . Protein-calorie malnutrition, severe 11/18/2019  . Palliative care by specialist   . Goals of care, counseling/discussion   . Splenic mass   . Liver metastases ([place])   . Pancreatic mass   . Sepsis ([place]) 11/16/2019  . Primary osteoarthritis of both hands 01/09/2017  . Primary osteoarthritis of both feet 01/09/2017  . Spondylosis of lumbar region without myelopathy or radiculopathy 01/09/2017  . History of hepatitis 12/05/2016  . 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 History of chronic kidney disease 12/05/2016  . Pancreatitis, acute 04/23/2016  . Essential hypertension 04/23/2016  . Upper abdominal pain 04/22/2016  . Abdominal pain 04/22/2016  . Vasovagal syncope 09/19/2015  . Postprandial nausea 03/08/2015  . Syncope 03/07/2015  . Dyspnea on exertion 03/07/2015  . Blood loss anemia 04/16/2014  . Acute blood loss anemia 04/16/2014  . Melena 04/16/2014  . CKD (chronic kidney disease), stage III 03/06/2014  . Nonspecific abnormal finding in stool contents 11/06/2013  . Anemia 11/05/2013  . Renal insufficiency   . Breast cancer, left breast ([place]) 09/01/2012  . Alcohol use 07/06/2012  . Near syncope 07/06/2012  . Hepatitis   . Lipid disorder   . Anxiety   . Hypertension   . Anemia of chronic disease  04/16/2012   PCP:  Lucianne Lei, MD Pharmacy:   [facility] [address] [place] [address] Phone: [phone] Fax: [place], [place] [address] Phone: [phone] Fax: [phone]     Social Determinants of Health (SDOH) Interventions    Readmission Risk Interventions No flowsheet data found.

## 2019-11-25 NOTE — Progress Notes (Signed)
Manufacturing engineer Documentation    Liaison received referral for pt to return home s/p discharge with hospice services.   Writer contacted son, Legrand Como, to confirm interest. Explained hospice services and philosophy and answered all questions. Son agrees to hospice services and stated that pt does need oxygen and a 3:1 order. Liaison to order DME upon approval to hospice services.  Pt's chart in review by hospice MD to determine eligibility. Will update TOC and family once pt is approved.  Please do not hesitate to outreach with any questions and thank you for the referral.   Freddie Breech, RN [facility] Liaison [phone]

## 2019-11-25 NOTE — Progress Notes (Signed)
Daily Progress Note   Patient Name: [name]       Date: 11/25/2019 DOB: 1932/03/06  Age: 86 y.o. MRN#: [id number] Attending Physician: Cristal Ford, DO Primary Care Physician: Lucianne Lei, MD Admit Date: 11/16/2019  Reason for Consultation/Follow-up: Establishing goals of care  Subjective: Patient awake, alert, oriented and able to participate in discussion. Denies pain. Given oxycodone this AM and reports relief.   GOC:   Initially spoke with son, [name] via telephone. He is unable to meet with PMT provider at bedside this afternoon since he works but available to discuss via telephone.   Introduced role of palliative medicine. Discussed course of hospitalization including diagnoses and interventions. [name] and his mother spoke with Dr. Lindi Adie yesterday and understand biopsy results, poor candidacy for oncology interventions, poor prognosis and recommendation for hospice services.   [name] understands that chemo would 'shorten her life' and her prognosis is likely months with metastatic cancer. He speaks of this being in "God's hands" and wishing for his mother to be "comfortable." [name] wishes to take her home with support of hospice services. We discussed that they will likely need additional caregiver support during the day since [name] works and his mother is very weak. [name] to further discuss with [facility] RN CM/SW.   Discussed his mother's EOL wishes and recommendation for DNR code status with terminal cancer and plan to return home with hospice services/falls in line with hospice philosophy. [name] shares that his mother has previously stated "don't want to be a vegetable" and "let her go" when her heart stops. Reassured [name] that I plan to discuss with his mother  at bedside this afternoon. [name] is agreeable to DNR code status if she is ready to make that decision today.   Answered questions for [name]. He has PMT contact information.   **Shortly after, met with patient at bedside to discuss goals of care. Patient understands biopsy results, poor prognosis, and that she is not a candidate for chemotherapy. She understands recommendation for comfort focused care and hospice enrollment. She agrees with plan for home hospice services for focus on comfort measures and symptom management. We discussed quality days with her family at home.   Discussed code status and recommendation for DNR with underlying terminal cancer, frailty, and futility of performing heroic measures at EOL with terminal  condition. Patient states "when it's my time to go, let me go." She is not fearful of death and has accepted poor prognosis. Patient requests code status change to DNR. Explained that durable DNR will be completed and sent home on discharge.   Discussed symptoms. Patient only complaining of abdominal pain. Her pain is relieved by prn oxycodone. Denies other symptom concerns.   Reassured patient that SW/RN CM will discuss discharge plan with her son. Answered all questions/concerns. Emotional/spiritual support provided. PMT contact information given.    Length of Stay: 9  Current Medications: Scheduled Meds:  . feeding supplement (ENSURE ENLIVE)  237 mL Oral TID BM    Continuous Infusions:   PRN Meds: acetaminophen **OR** acetaminophen, oxyCODONE  Physical Exam Vitals and nursing note reviewed.  Constitutional:      General: She is awake.     Appearance: She is cachectic. She is ill-appearing.  HENT:     Head: Normocephalic and atraumatic.  Cardiovascular:     Rate and Rhythm: Normal rate.  Pulmonary:     Effort: No tachypnea, accessory muscle usage or respiratory distress.  Abdominal:     Tenderness: There is no abdominal tenderness.  Skin:     General: Skin is warm and dry.  Neurological:     Mental Status: She is alert and oriented to person, place, and time.  Psychiatric:        Mood and Affect: Mood normal.        Speech: Speech normal.        Behavior: Behavior normal.        Cognition and Memory: Cognition normal.            Vital Signs: BP (!) 118/51 (BP Location: Right Arm)   Pulse 86   Temp 98.7 F (37.1 C) (Oral)   Resp 18   Ht '5\' 2"'$  (1.575 m)   Wt 43.8 kg   SpO2 93%   BMI 17.65 kg/m  SpO2: SpO2: 93 % O2 Device: O2 Device: Nasal Cannula O2 Flow Rate: O2 Flow Rate (L/min): 4 L/min  Intake/output summary: No intake or output data in the 24 hours ending 11/25/19 1043 LBM: Last BM Date: 11/25/19 Baseline Weight: Weight: 44.2 kg Most recent weight: Weight: 43.8 kg       Palliative Assessment/Data: PPS 50%    Flowsheet Rows     Most Recent Value  Intake Tab  Referral Department  Hospitalist  Unit at Time of Referral  ER  Palliative Care Primary Diagnosis  Cancer  Date Notified  11/17/19  Palliative Care Type  New Palliative care  Reason for referral  Clarify Goals of Care  Date of Admission  11/16/19  Date first seen by Palliative Care  11/18/19  # of days Palliative referral response time  1 Day(s)  # of days IP prior to Palliative referral  1  Clinical Assessment  Psychosocial & Spiritual Assessment  Palliative Care Outcomes      Patient Active Problem List   Diagnosis Date Noted  . Protein-calorie malnutrition, severe 11/18/2019  . Palliative care by specialist   . Goals of care, counseling/discussion   . Splenic mass   . Liver metastases ([place])   . Pancreatic mass   . Sepsis ([place]) 11/16/2019  . Primary osteoarthritis of both hands 01/09/2017  . Primary osteoarthritis of both feet 01/09/2017  . Spondylosis of lumbar region without myelopathy or radiculopathy 01/09/2017  . History of hepatitis 12/05/2016  . Chronic gout due to renal impairment of multiple  sites without tophus 12/05/2016   . History of chronic kidney disease 12/05/2016  . Pancreatitis, acute 04/23/2016  . Essential hypertension 04/23/2016  . Upper abdominal pain 04/22/2016  . Abdominal pain 04/22/2016  . Vasovagal syncope 09/19/2015  . Postprandial nausea 03/08/2015  . Syncope 03/07/2015  . Dyspnea on exertion 03/07/2015  . Blood loss anemia 04/16/2014  . Acute blood loss anemia 04/16/2014  . Melena 04/16/2014  . CKD (chronic kidney disease), stage III 03/06/2014  . Nonspecific abnormal finding in stool contents 11/06/2013  . Anemia 11/05/2013  . Renal insufficiency   . Breast cancer, left breast ([place]) 09/01/2012  . Alcohol use 07/06/2012  . Near syncope 07/06/2012  . Hepatitis   . Lipid disorder   . Anxiety   . Hypertension   . Anemia of chronic disease 04/16/2012    Palliative Care Assessment & Plan   Patient Profile: 86 y.o. female with past medical history of breast cancer 2013 s/p lumpectomy, radiation, and antiestrogen therapy, hypertension, hyperlipidemia, chronic abdominal pain, RA, CKD stage III, hepatitis C and liver cirrhosis admitted on 11/16/2019 with abdominal pain x2 weeks, poor oral intake, and weight loss. CTA of the chest/abdomen revealed lung emphysema, bilateral lower lung lobe atelectasis, large stable soft tissue mass around the tail of pancreas and numerous liver metastasis with evidence of splenic infarct. IR performed liver biopsy which is pending. Oncology following. Per Dr. Lindi Adie, CTA concerning for metastatic pancreatic cancer (which would indicate extremely poor prognosis and would not offer systemic chemotherapy) or other primary, possibly metastatic breast cancer. Awaiting results of biopsy. Palliative medicine consultation for goals of care.   Liver biopsy suggested metastatic breast cancer. Dr. Lindi Adie has followed up with patient/son. Patient is not a candidate for chemotherapy with extent of disease, advanced age, and poor performance status. Dr. Lindi Adie recommending  hospice services with poor prognosis.   Assessment: SIRS Abdominal pain Widespread metastatic disease with liver lesions: biopsy indicating metastatic breast cancer Hepatitis C with cirrhosis Coagulopathy AKI Poor nutritional status Deconditioning  Recommendations/Plan:  Appreciate oncology follow-up. Patient is not a candidate for oncology treatment options with metastatic disease, advanced age, and poor performance status.   Understanding diagnoses and poor prognosis, patient/son wish to discharge home with support of hospice services and focus on comfort/symptom management.   TOC team notified and helping with discharge plan. Will need DME.  After further code status discussion, patient wishes for DNR code status. Durable DNR completed. Son agrees with this decision.   Continue prn oxycodone on discharge for pain relief.   Code Status: DNR   Code Status Orders  (From admission, onward)         Start     Ordered   11/16/19 2250  Full code  Continuous     11/16/19 2249        Code Status History    Date Active Date Inactive Code Status Order ID Comments User Context   04/22/2016 2213 04/24/2016 1614 Full Code [id number]  Norval Morton, MD Inpatient   03/07/2015 1539 03/09/2015 1533 Full Code [id number]  Orson Eva, MD Inpatient   04/16/2014 1633 04/19/2014 2131 Full Code [id number]  Charlynne Cousins, MD Inpatient   03/06/2014 1737 03/10/2014 1351 Full Code [id number]  Verlee Monte, MD Inpatient   11/05/2013 2357 11/09/2013 1359 Full Code [id number]  Theressa Millard, MD Inpatient   Advance Care Planning Activity    Advance Directive Documentation     Most Recent Value  Type of Advance Directive  Healthcare Power  of Attorney  Pre-existing out of facility DNR order (yellow form or pink MOST form)  --  "MOST" Form in Place?  --       Prognosis:   < 6 months: if not significantly less with metastatic breast cancer not a candidate for chemotherapy. Declining  functional/nutritional status. Goals are for comfort and initiation of home hospice services.   Discharge Planning:  Home with Hospice  Care plan was discussed with RN, patient, son [name]), Dr Ree Kida, RN CM  Thank you for allowing the Palliative Medicine Team to assist in the care of this patient.   Time In: 1030- 1110- Time Out: 3005 1102 Total Time 50 Prolonged Time Billed  no      Greater than 50%  of this time was spent counseling and coordinating care related to the above assessment and plan.  Ihor Dow, DNP, FNP-C Palliative Medicine Team  Phone: [phone] Fax: [phone]  Please contact Palliative Medicine Team phone at [phone] for questions and concerns.

## 2019-11-26 DIAGNOSIS — Z23 Encounter for immunization: Secondary | ICD-10-CM | POA: Diagnosis not present

## 2019-11-26 MED ORDER — ENSURE ENLIVE PO LIQD: 237.0000 mL | Freq: Three times a day (TID) | ORAL | Status: AC

## 2019-11-26 MED ORDER — ACETAMINOPHEN 650 MG RE SUPP: 650.0000 mg | Freq: Four times a day (QID) | RECTAL | Status: DC | PRN

## 2019-11-26 MED ORDER — ACETAMINOPHEN 325 MG PO TABS
650.0000 mg | ORAL_TABLET | Freq: Four times a day (QID) | ORAL | Status: DC | PRN
  Administered 2019-11-26 (×2): 650 mg via ORAL
  Filled 2019-11-26 (×2): qty 2

## 2019-11-26 MED ORDER — SODIUM CHLORIDE 0.9 % IV BOLUS
500.0000 mL | Freq: Once | INTRAVENOUS | Status: AC
  Administered 2019-11-26: 500 mL via INTRAVENOUS

## 2019-11-26 MED ORDER — METOPROLOL TARTRATE 5 MG/5ML IV SOLN
5.0000 mg | Freq: Once | INTRAVENOUS | Status: AC
  Administered 2019-11-26: 03:00:00 5 mg via INTRAVENOUS
  Filled 2019-11-26: qty 5

## 2019-11-26 MED ORDER — OXYCODONE HCL 5 MG PO TABS: 5.0000 mg | ORAL_TABLET | ORAL | 0 refills | Status: AC | PRN

## 2019-11-26 NOTE — Discharge Summary (Addendum)
Physician Discharge Summary  [name] [id number] DOB: 04-07-1932 DOA: 11/16/2019  PCP: Lucianne Lei, MD  Admit date: 11/16/2019 Discharge date: 11/26/2019  Time spent: 45 minutes  Recommendations for Outpatient Follow-up:  Patient will be discharged to home with hospice. Patient should follow a regular diet.   Discharge Diagnoses:  SIRS Elevated D-dimer Coagulopathy Hepatitis C with cirrhosis and SVR Widespread metastatic disease with liver lesions Hypotension Lactic acidosis Acute kidney injury Metabolic acidosis Normocytic anemia Hypomagnesemia Hypophosphatemia Elevated LFTs with hyperbilirubinemia Goals of care  Discharge Condition: Stable  Diet recommendation: regular  Filed Weights   11/16/19 1244 11/16/19 2240  Weight: 44.2 kg 43.8 kg    History of present illness:  on 11/16/2019 by Dr. Dana Allan Patient is an 86 year old female with past medical history significant for breast cancer, hypertension, hyperlipidemia, chronic abdominal pain, rheumatoid arthritis and chronic kidney disease stage IIIA/B. Patient was seen by the GI provider earlier today forabdominal pain. Apparently, patient has had abdominal pain for about 2 weeks. Patient reported constant lower abdominal pain. There is associated poor p.o. intake and poor appetite. Patient reported significant weight loss. On presentation to the GI provider's office, patient was found to be hypotensive, with systolic blood pressure in the 70s. Patient was advised to come to the hospital for further assessment and management. Lab work done at the hospitalrevealedCBC of 20.2, hemoglobin of 11.1, hematocrit of 34.3 and platelet count of 563. Chemistry revealed sodium of 134, potassium of 5.4, chloride 102, CO2 of 19, BUN of 34 with serum creatinine of 1.3 from blood sugar of 136. INR of 1.5 and lactic acid of 3.6 were reported as well. D-dimer was elevated. CTA of the chest and abdomen revealed  lung emphysema, bilateral lower lung lobe atelectasis, large stable soft tissue mass around the tail of pancreas and numerous liver metastasis. Evolving splenic infarct was also queried. No headache, no neck, no chest pain, no shortness of breath, no no nausea vomiting or change in bowel habit. Hospitalist team will admit patient for further assessment and management.  Hospital Course:  SIRS -Unclear etiology -Patient was noted to be hypotensive, tachycardic, tachypneic with leukocytosis on admission however no infection was found -Procalcitonin was elevated from 2.9 up to 17.13 and is now trending downward -Patient was initially placed on IV antibiotics however these were stopped by infectious disease -She was also placed on IV fluids however this was discontinued as well. -WBC is trending downward, currently 28.9 -Blood cultures and urine cultures show no growth to date -CT chest, abdomen, pelvis showed moderate severity of emphysematous lung disease.  Mild left upper lobe and bilateral lower lobe atelectasis.  Large stable soft tissue mass along the region of the pancreatic tail and splenic hilum, consistent with primary versus metastatic neoplastic disease.  Numerous stable liver lesions suggestive of liver metastasis.  Soft tissue masses within the mid to upper left abdomen and region of the splenic flexure consistent with additional neoplastic lesions.  Stable findings likely consistent with an evolving splenic infarct.  No evidence of retroperitoneal hematoma in the presence of lower abdominal pain and hypotension. -Repeat CT scanning showed a large pancreatic tail mass with invasion into the spleen.  Hepatic metastatic disease. -Patient continues to have intermittent fever  Elevated D-dimer -D-dimer was 3.14 -CTA chest PE study showed no filling defects -Lower extremity Doppler negative for DVT bilaterally -Echocardiogram showed an EF of 70-75%, no LV hypertrophy.  Grade 1 diastolic  dysfunction.  No regional wall motion abnormalities.  Coagulopathy -Patient  was given vitamin K orally, 5 mg -Suspect secondary to abdominal pathology  Hepatitis C with cirrhosis and SVR -Status post Harvoni treatment in 2017 -Followed by hepatologist at [facility] -Was previously drinking 2 cans of beer daily however has discontinued this -Ultrasound right upper quadrant showed no ascites  Widespread metastatic disease with liver lesions -Was noted on CT scan above -Question whether this is pancreatic cancer versus recurrent breast cancer -Patient status post radiation, left breast mastectomy as well as Arimidex -Follows with Dr. Lindi Adie, oncology -Biopsy currently pending which was done by interventional radiology on 11/22/2019- metastatic breast cancer -AFP 5.7, CA 19-9 less than 3 -Suspect this is a contributing patient's abdominal pain -Discussed with Dr. Lindi Adie, patient's overall prognosis is poor.  Recommends hospice care.  Hypotension -Stable-patient has poor oral intake -Amlodipine held -TSH within normal limits  Lactic acidosis -Unknown etiology however lactic acid did improve from 3.2-1.9 -Possibly due to dehydration  Acute kidney injury -Possibly due to dehydration and hypotension -Creatinine peaked to 1.11, down to 1.04  -Continue to monitor BMP  Metabolic acidosis -Resolved  Normocytic anemia -Hemoglobin dropped to 7.2, question if this was dilutional drop versus bleeding -Patient was transfused 2 units PRBC -Hemoglobin 9.7  Hypomagnesemia -Resolved   Hypophosphatemia -resolved  Elevated LFTs with hyperbilirubinemia -Suspect secondary to the above -resolved  Goals of care -Given biopsy results, patient noted to have metastatic breast cancer.  Discussed with oncology, patient's overall prognosis is poor.  Recommended home hospice. -Palliative care consulted and appreciated- code status changed to DNR; recommended oxycodone for pain relief on  discharge  Severe malnutrition -nutrition consulted, continue supplements  Code status: DNR  Consultants Gastroenterology, Dr. Tarri Glenn Oncology, Dr. Lindi Adie Interventional radiology Palliative care Infectious disease  Procedures  Liver biopsy Lower extremity Doppler Echocardiogram  Discharge Exam: Vitals:   11/26/19 0826 11/26/19 1127  BP: (!) 117/47 (!) 116/51  Pulse: 88 89  Resp: 18 20  Temp: 98.7 F (37.1 C) 98 F (36.7 C)  SpO2: 92% 92%     General: Well developed, chronically ill appearing, elderly, NAD  HEENT: NCAT, mucous membranes moist.  Abdomen: Soft, nontender, nondistended, + bowel sounds  Extremities: warm dry without cyanosis clubbing or edema  Neuro: AAOx3, nonfocal  Discharge Instructions  Allergies as of 11/26/2019   No Known Allergies     Medication List    STOP taking these medications   allopurinol 100 MG tablet Commonly known as: ZYLOPRIM   amLODipine 5 MG tablet Commonly known as: NORVASC   anastrozole 1 MG tablet Commonly known as: ARIMIDEX   irbesartan 150 MG tablet Commonly known as: AVAPRO   metoCLOPramide 5 MG tablet Commonly known as: REGLAN   omeprazole 20 MG capsule Commonly known as: PRILOSEC     TAKE these medications   feeding supplement (ENSURE ENLIVE) Liqd Take 237 mLs by mouth 3 (three) times daily between meals.   oxyCODONE 5 MG immediate release tablet Commonly known as: Oxy IR/ROXICODONE Take 1-2 tablets (5-10 mg total) by mouth every 4 (four) hours as needed for moderate pain or severe pain.      No Known Allergies Follow-up Information    [facility] Follow up.   Why: Home Hospice referral made for home hospice needs and DME Contact information: [place] [phone]           The results of significant diagnostics from this hospitalization (including imaging, microbiology, ancillary and laboratory) are listed below for reference.     Significant  Diagnostic Studies: CT Angio Chest PE W/Cm &/Or Wo Cm  Result Date: 11/16/2019 CLINICAL DATA:  Lower abdominal pain with hypotension. EXAM: CT CHEST, ABDOMEN, AND PELVIS WITH CONTRAST TECHNIQUE: Multidetector CT imaging of the chest, abdomen and pelvis was performed following the standard protocol during bolus administration of intravenous contrast. CONTRAST:  39mL OMNIPAQUE IOHEXOL 350 MG/ML SOLN COMPARISON:  Abdomen and pelvis CT, dated October 27, 2019, is available. FINDINGS: CT CHEST FINDINGS Cardiovascular: There is marked severity calcification of the thoracic aorta. The pulmonary arteries are normal in appearance without evidence of intraluminal filling defects. Normal heart size. A very small (4 mm) anterior pericardial effusion is seen. Marked severity coronary artery calcification is seen. Mediastinum/Nodes: No enlarged mediastinal, hilar, or axillary lymph nodes. Thyroid gland, trachea, and esophagus demonstrate no significant findings. Lungs/Pleura: There is moderate severity emphysematous lung disease. Mild atelectasis is seen within the posterior aspect of the left upper lobe and bilateral lower lobes. There is no evidence of a pleural effusion or pneumothorax. Musculoskeletal: Multilevel degenerative changes seen throughout the thoracic spine. CT ABDOMEN PELVIS FINDINGS Hepatobiliary: Numerous heterogeneous low-attenuation liver lesions of various sizes are seen scattered throughout the right and left lobe. These areas are stable in size and appearance when compared to the prior study. No gallstones, gallbladder wall thickening, or biliary dilatation. Pancreas: A predominant stable 6.1 cm x 5.6 cm heterogeneous low-attenuation soft tissue mass is seen along the region of the pancreatic tail and splenic hilum. A stable, adjacent 2.4 cm x 1.1 cm soft tissue mass is noted near the splenic flexure (axial CT image 20, CT series number 12). An additional stable 3.0 cm x 2.6 cm soft tissue  mass is seen within the anterolateral aspect of the mid to upper left abdomen (axial CT image 28, CT series number 12). Spleen: A large, stable area of heterogeneous attenuation is seen along the posterior aspect of the splenic parenchyma. Adrenals/Urinary Tract: The left and right adrenal glands are poorly visualized. Kidneys are normal, without renal calculi, focal lesion, or hydronephrosis. Bladder is unremarkable. Stomach/Bowel: Stomach is within normal limits. The appendix is not clearly identified. No evidence of bowel wall thickening, distention, or inflammatory changes. Vascular/Lymphatic: Marked severity aortic atherosclerosis. No enlarged abdominal or pelvic lymph nodes. Reproductive: Status post hysterectomy. No adnexal masses. Other: No abdominal wall hernia or abnormality. No abdominopelvic ascites. Musculoskeletal: Multilevel degenerative changes seen throughout the lumbar spine. IMPRESSION: 1. Moderate severity emphysematous lung disease. 2. Mild left upper lobe and bilateral lower lobe atelectasis. 3. Large stable soft tissue mass along the region of the pancreatic tail and splenic hilum, consistent with primary versus metastatic neoplastic disease. 4. Numerous stable liver lesions consistent with liver metastasis. 5. Stable soft tissue masses within the mid to upper left abdomen and region of the splenic flexure consistent with additional neoplastic lesions. 6. Stable findings likely consistent with an evolving splenic infarct. 7. No evidence of a retroperitoneal hematoma in the presence of lower abdominal pain and hypotension. Electronically Signed   By: Virgina Norfolk M.D.   On: 11/16/2019 18:24   CT ABDOMEN PELVIS W CONTRAST  Result Date: 11/22/2019 CLINICAL DATA:  86 year old female with abdominal pain. History of breast cancer and pancreatic mass. EXAM: CT ABDOMEN AND PELVIS WITH CONTRAST TECHNIQUE: Multidetector CT imaging of the abdomen and pelvis was performed using the standard  protocol following bolus administration of intravenous contrast. CONTRAST:  74mL OMNIPAQUE IOHEXOL 300 MG/ML  SOLN COMPARISON:  CT abdomen pelvis dated 11/16/2019. FINDINGS: Lower chest: Partially visualized small  to moderate bilateral pleural effusions, new since the prior CT. There are consolidative changes of the lower lobes with air bronchograms which may represent atelectasis or infiltrate. Clinical correlation is recommended. There is emphysematous changes of the visualized lung. Advanced 3 vessel coronary vascular calcification as well as atherosclerotic calcification of the ascending aorta. No intra-abdominal free air. There is a small free fluid within the pelvis as well as small perisplenic free fluid, new since the prior CT. Hepatobiliary: Multiple (greater than 10) hepatic hypoenhancing masses measuring up to 4.7 cm in the left lobe of the liver consistent with metastatic disease. No intrahepatic biliary ductal dilatation. No calcified gallstone or pericholecystic fluid. Pancreas: There is a 7.6 x 5.2 x 5.0 cm lobulated mass with areas of decreased enhancement or necrosis arising from the tail of the pancreas and invading into the spleen. There is also extension of the mass to the left lateral peritoneal wall. This is consistent with malignancy. Spleen: Heterogeneous enhancement of the spleen with areas of decreased enhancement likely combination of tumoral invasion from the tail of the pancreas as well as areas of splenic infarct. Adrenals/Urinary Tract: The right adrenal gland is unremarkable. There is abutment of the left adrenal gland by the pancreatic tail mass. There is no hydronephrosis on either side. There is symmetric enhancement and excretion of contrast by both kidneys. Probable 1 cm right renal upper pole cyst. The urinary bladder is grossly unremarkable. Stomach/Bowel: There is loose stool throughout the colon compatible with diarrheal state. Correlation with clinical exam and stool  cultures recommended. There is loss of fat plane between the pancreatic tail mass and inferior posterior greater curvature of the stomach (series 3, image 25 and axial series 6, image 68). There is also loss of fat plane between the pancreatic mass and splenic flexure of the colon. There is no bowel obstruction or active inflammation. Appendectomy. Vascular/Lymphatic: Advanced aortoiliac atherosclerotic disease. The IVC is unremarkable. No portal venous gas. No adenopathy. Reproductive: Hysterectomy. Other: A 3 x 3 cm enhancing lesion in the left anterior peritoneum consistent with metastatic disease. Musculoskeletal: Degenerative changes of the spine. No acute osseous pathology. Diffuse subcutaneous edema. IMPRESSION: 1. Large pancreatic tail mass with invasion into the spleen. 2. Hypoenhancing splenic tissue likely combination of tumoral infiltration and splenic infarcts. 3. Hepatic metastatic disease. 4. Abutment of the posterior greater curvature of the stomach as well as splenic flexure of the colon by the pancreatic mass. 5. Metastatic disease/implant in the left upper abdomen. 6. Diarrheal state. Clinical correlation is recommended. No bowel obstruction. 7. Interval development of small to moderate bilateral pleural effusions and bilateral lower lobe atelectasis or infiltrate. Interval development of small ascites. 8. Aortic Atherosclerosis (ICD10-I70.0) and Emphysema (ICD10-J43.9). Electronically Signed   By: Anner Crete M.D.   On: 11/22/2019 20:31   CT Abdomen Pelvis W Contrast  Result Date: 11/16/2019 CLINICAL DATA:  Lower abdominal pain with hypotension. EXAM: CT CHEST, ABDOMEN, AND PELVIS WITH CONTRAST TECHNIQUE: Multidetector CT imaging of the chest, abdomen and pelvis was performed following the standard protocol during bolus administration of intravenous contrast. CONTRAST:  37mL OMNIPAQUE IOHEXOL 350 MG/ML SOLN COMPARISON:  Abdomen and pelvis CT, dated October 27, 2019, is available.  FINDINGS: CT CHEST FINDINGS Cardiovascular: There is marked severity calcification of the thoracic aorta. The pulmonary arteries are normal in appearance without evidence of intraluminal filling defects. Normal heart size. A very small (4 mm) anterior pericardial effusion is seen. Marked severity coronary artery calcification is seen. Mediastinum/Nodes: No enlarged mediastinal, hilar,  or axillary lymph nodes. Thyroid gland, trachea, and esophagus demonstrate no significant findings. Lungs/Pleura: There is moderate severity emphysematous lung disease. Mild atelectasis is seen within the posterior aspect of the left upper lobe and bilateral lower lobes. There is no evidence of a pleural effusion or pneumothorax. Musculoskeletal: Multilevel degenerative changes seen throughout the thoracic spine. CT ABDOMEN PELVIS FINDINGS Hepatobiliary: Numerous heterogeneous low-attenuation liver lesions of various sizes are seen scattered throughout the right and left lobe. These areas are stable in size and appearance when compared to the prior study. No gallstones, gallbladder wall thickening, or biliary dilatation. Pancreas: A predominant stable 6.1 cm x 5.6 cm heterogeneous low-attenuation soft tissue mass is seen along the region of the pancreatic tail and splenic hilum. A stable, adjacent 2.4 cm x 1.1 cm soft tissue mass is noted near the splenic flexure (axial CT image 20, CT series number 12). An additional stable 3.0 cm x 2.6 cm soft tissue mass is seen within the anterolateral aspect of the mid to upper left abdomen (axial CT image 28, CT series number 12). Spleen: A large, stable area of heterogeneous attenuation is seen along the posterior aspect of the splenic parenchyma. Adrenals/Urinary Tract: The left and right adrenal glands are poorly visualized. Kidneys are normal, without renal calculi, focal lesion, or hydronephrosis. Bladder is unremarkable. Stomach/Bowel: Stomach is within normal limits. The appendix is not  clearly identified. No evidence of bowel wall thickening, distention, or inflammatory changes. Vascular/Lymphatic: Marked severity aortic atherosclerosis. No enlarged abdominal or pelvic lymph nodes. Reproductive: Status post hysterectomy. No adnexal masses. Other: No abdominal wall hernia or abnormality. No abdominopelvic ascites. Musculoskeletal: Multilevel degenerative changes seen throughout the lumbar spine. IMPRESSION: 1. Moderate severity emphysematous lung disease. 2. Mild left upper lobe and bilateral lower lobe atelectasis. 3. Large stable soft tissue mass along the region of the pancreatic tail and splenic hilum, consistent with primary versus metastatic neoplastic disease. 4. Numerous stable liver lesions consistent with liver metastasis. 5. Stable soft tissue masses within the mid to upper left abdomen and region of the splenic flexure consistent with additional neoplastic lesions. 6. Stable findings likely consistent with an evolving splenic infarct. 7. No evidence of a retroperitoneal hematoma in the presence of lower abdominal pain and hypotension. Electronically Signed   By: Virgina Norfolk M.D.   On: 11/16/2019 18:24   US BIOPSY (LIVER)  Result Date: 11/22/2019 INDICATION: 86 year old female with a pancreatic tail mass and multiple liver lesions concerning for metastatic disease. She presents for ultrasound-guided core biopsy to establish tissue diagnosis. EXAM: ULTRASOUND BIOPSY CORE LIVER MEDICATIONS: None. ANESTHESIA/SEDATION: 0.25 mg Versed were a given for anxiolysis. This does not constitute moderate sedation. FLUOROSCOPY TIME:  None. COMPLICATIONS: None immediate. PROCEDURE: Informed written consent was obtained from the patient after a thorough discussion of the procedural risks, benefits and alternatives. All questions were addressed. A timeout was performed prior to the initiation of the procedure. The liver was interrogated with ultrasound. Multiple echogenic masslike areas are  successfully identified. A suitable lesion was selected. A skin entry site was marked. The overlying skin was sterilely prepped and draped in the standard fashion using chlorhexidine skin prep. Local anesthesia was attained by infiltration with 1% lidocaine. A small dermatotomy was made. Under real-time ultrasound guidance, a 17 gauge introducer needle was advanced through the liver and positioned at the margin of the mass. Multiple 18 gauge core biopsies were then coaxially obtained using the bio Pince automated biopsy device. Biopsy specimens were placed in formalin and delivered to  pathology for further analysis. As the introducer needle was removed, the biopsy tract was embolized with a Gel-Foam slurry. Overall, the patient tolerated the procedure well. There was no immediate complication. IMPRESSION: Technically successful ultrasound-guided core biopsy of liver lesion. Electronically Signed   By: Jacqulynn Cadet M.D.   On: 11/22/2019 16:33   DG CHEST PORT 1 VIEW  Result Date: 11/24/2019 CLINICAL DATA:  Shortness of breath. EXAM: PORTABLE CHEST 1 VIEW COMPARISON:  11/22/2019 FINDINGS: The cardiac silhouette, mediastinal and hilar contours are within normal limits and stable. Stable tortuosity and dense calcification of the thoracic aorta. Interval development of significant left lower lobe atelectasis likely due to mucous plugging. Stable surgical changes involving the left lower lobe. Underlying significant chronic lung disease with persistent superimposed right basilar atelectasis and bilateral pleural effusions. IMPRESSION: Interval significant progression of left lower lobe atelectasis likely due to mucous plugging. Electronically Signed   By: Marijo Sanes M.D.   On: 11/24/2019 08:28   DG CHEST PORT 1 VIEW  Result Date: 11/22/2019 CLINICAL DATA:  Dyspnea, chest pain and cough. EXAM: PORTABLE CHEST 1 VIEW COMPARISON:  Chest x-ray 11/18/2019 FINDINGS: The cardiac silhouette, mediastinal and hilar  contours are stable. There is extensive calcification of the thoracic aorta. Underlying significant chronic lung disease with emphysema and pulmonary scarring. Suspect superimposed new interstitial edema with thickened inter septal lines (Kerley B-lines), left pleural effusion and bibasilar atelectasis. IMPRESSION: Chronic underlying lung disease with superimposed interstitial edema, left pleural effusion and bibasilar atelectasis. Electronically Signed   By: Marijo Sanes M.D.   On: 11/22/2019 07:18   DG CHEST PORT 1 VIEW  Result Date: 11/18/2019 CLINICAL DATA:  87 year old female with a history of tachypnea, shortness of breath, chest pain EXAM: PORTABLE CHEST 1 VIEW COMPARISON:  Chest CT 11/16/2019, plain film 08/06/2016 FINDINGS: Cardiomediastinal silhouette unchanged in size and contour. Calcification of the thoracic aorta. Diffusely coarsened interstitial markings. No pneumothorax. No confluent airspace disease. Surgical changes of the left chest wall.  No pleural effusion. IMPRESSION: Chronic lung changes without evidence of acute cardiopulmonary disease Electronically Signed   By: Corrie Mckusick D.O.   On: 11/18/2019 16:35   DG Abd Portable 1V  Result Date: 11/20/2019 CLINICAL DATA:  Abdominal pain. EXAM: PORTABLE ABDOMEN - 1 VIEW COMPARISON:  CT abdomen pelvis 11/16/2019 FINDINGS: Small left pleural effusion and underlying consolidation. Small amount of gas within nondilated loops of bowel throughout the abdomen. Supine evaluation limited for the detection of free intraperitoneal air. Aortic atherosclerosis. IMPRESSION: Nonobstructed bowel gas pattern. Small left pleural effusion and underlying consolidation. Electronically Signed   By: Lovey Newcomer M.D.   On: 11/20/2019 07:38   ECHOCARDIOGRAM COMPLETE  Result Date: 11/20/2019   ECHOCARDIOGRAM REPORT   Patient Name:   KALICIA WICHT Date of Exam: 11/20/2019 Medical Rec #:  [id number]            Height:       62.0 in Accession #:    [id number]            Weight:       96.5 lb Date of Birth:  October 04, 1932            BSA:          1.40 m Patient Age:    65 years             BP:           122/60 mmHg Patient Gender: F  HR:           96 bpm. Exam Location:  Inpatient Procedure: 2D Echo Indications:    fever 780.6  History:        Patient has prior history of Echocardiogram examinations, most                 recent 03/08/2015. Risk Factors:Hypertension and Former Smoker.  Sonographer:    Jannett Celestine RDCS (AE) Referring Phys: [id number] Georgina Quint LATIF [facility]  Sonographer Comments: restricted mobiliy. off axis apical window IMPRESSIONS  1. Left ventricular ejection fraction, by visual estimation, is 70 to 75%. The left ventricle has hyperdynamic function. There is no left ventricular hypertrophy.  2. Left ventricular diastolic parameters are consistent with Grade I diastolic dysfunction (impaired relaxation).  3. The left ventricle has no regional wall motion abnormalities.  4. Global right ventricle has normal systolic function.The right ventricular size is normal. No increase in right ventricular wall thickness.  5. Left atrial size was normal.  6. Right atrial size was normal.  7. The mitral valve is degenerative. No evidence of mitral valve regurgitation. No evidence of mitral stenosis.  8. The tricuspid valve is normal in structure.  9. The tricuspid valve is normal in structure. Tricuspid valve regurgitation is mild. 10. Aortic valve mean gradient measures 11.0 mmHg. 11. Aortic valve peak gradient measures 21.9 mmHg. 12. The aortic valve is tricuspid. Aortic valve regurgitation is not visualized. Mild aortic valve stenosis. 13. The pulmonic valve was normal in structure. Pulmonic valve regurgitation is not visualized. 14. The inferior vena cava is normal in size with greater than 50% respiratory variability, suggesting right atrial pressure of 3 mmHg. FINDINGS  Left Ventricle: Left ventricular ejection fraction, by visual estimation, is 70 to 75%.  The left ventricle has hyperdynamic function. The left ventricle has no regional wall motion abnormalities. There is no left ventricular hypertrophy. Left ventricular diastolic parameters are consistent with Grade I diastolic dysfunction (impaired relaxation). Normal left atrial pressure. Right Ventricle: The right ventricular size is normal. No increase in right ventricular wall thickness. Global RV systolic function is has normal systolic function. Left Atrium: Left atrial size was normal in size. Right Atrium: Right atrial size was normal in size Pericardium: There is no evidence of pericardial effusion. Mitral Valve: The mitral valve is degenerative in appearance. There is mild thickening of the mitral valve leaflet(s). No evidence of mitral valve regurgitation. No evidence of mitral valve stenosis by observation. Tricuspid Valve: The tricuspid valve is normal in structure. Tricuspid valve regurgitation is mild. Aortic Valve: The aortic valve is tricuspid. . There is moderate thickening and mild calcification of the aortic valve. Aortic valve regurgitation is not visualized. Mild aortic stenosis is present. There is moderate thickening of the aortic valve. There  is mild calcification of the aortic valve. Aortic valve mean gradient measures 11.0 mmHg. Aortic valve peak gradient measures 21.9 mmHg. Aortic valve area, by VTI measures 1.62 cm. Pulmonic Valve: The pulmonic valve was normal in structure. Pulmonic valve regurgitation is not visualized. Pulmonic regurgitation is not visualized. Aorta: The aortic root, ascending aorta and aortic arch are all structurally normal, with no evidence of dilitation or obstruction. Venous: The inferior vena cava is normal in size with greater than 50% respiratory variability, suggesting right atrial pressure of 3 mmHg. IAS/Shunts: No atrial level shunt detected by color flow Doppler. There is no evidence of a patent foramen ovale. No ventricular septal defect is seen or  detected. There is no evidence of  an atrial septal defect.  LEFT VENTRICLE PLAX 2D LVIDd:         3.10 cm  Diastology LVIDs:         1.80 cm  LV e' lateral:   11.10 cm/s LV PW:         0.90 cm  LV E/e' lateral: 6.1 LV IVS:        0.90 cm  LV e' medial:    9.36 cm/s LVOT diam:     1.75 cm  LV E/e' medial:  7.3 LV SV:         28 ml LV SV Index:   20.49 LVOT Area:     2.41 cm  RIGHT VENTRICLE TAPSE (M-mode): 1.8 cm LEFT ATRIUM           Index       RIGHT ATRIUM           Index LA diam:      3.50 cm 2.50 cm/m  RA Area:     13.00 cm LA Vol (A4C): 36.3 ml 25.88 ml/m RA Volume:   29.90 ml  21.32 ml/m  AORTIC VALVE AV Area (Vmax):    1.50 cm AV Area (Vmean):   1.80 cm AV Area (VTI):     1.62 cm AV Vmax:           234.00 cm/s AV Vmean:          155.000 cm/s AV VTI:            0.375 m AV Peak Grad:      21.9 mmHg AV Mean Grad:      11.0 mmHg LVOT Vmax:         146.00 cm/s LVOT Vmean:        116.000 cm/s LVOT VTI:          0.252 m LVOT/AV VTI ratio: 0.67  AORTA Ao Root diam: 2.90 cm MITRAL VALVE MV Area (PHT): 4.21 cm             SHUNTS MV PHT:        52.20 msec           Systemic VTI:  0.25 m MV Decel Time: 180 msec             Systemic Diam: 1.75 cm MV E velocity: 68.10 cm/s 103 cm/s MV A velocity: 72.00 cm/s 70.3 cm/s MV E/A ratio:  0.95       1.5  Candee Furbish MD Electronically signed by Candee Furbish MD Signature Date/Time: 11/20/2019/3:53:34 PM    Final    [place] ASCITES (ABDOMEN LIMITED)  Result Date: 11/20/2019 CLINICAL DATA:  Hepatitis. EXAM: LIMITED ABDOMEN ULTRASOUND FOR ASCITES TECHNIQUE: Limited ultrasound survey for ascites was performed in all four abdominal quadrants. COMPARISON:  None. FINDINGS: No ascites.  There is a small right pleural effusion. IMPRESSION: 1. No ascites. 2. Right pleural effusion. Electronically Signed   By: Dorise Bullion III M.D   On: 11/20/2019 13:38   VAS [place] LOWER EXTREMITY VENOUS (DVT)  Result Date: 11/20/2019  Lower Venous Study Indications: Elevated d dimer.  Limitations:  Patient positioning. Comparison Study: no prior Performing Technologist: Abram Sander RVS  Examination Guidelines: A complete evaluation includes B-mode imaging, spectral Doppler, color Doppler, and power Doppler as needed of all accessible portions of each vessel. Bilateral testing is considered an integral part of a complete examination. Limited examinations for reoccurring indications may be performed as noted.  +---------+---------------+---------+-----------+----------+--------------+ RIGHT    CompressibilityPhasicitySpontaneityPropertiesThrombus Aging +---------+---------------+---------+-----------+----------+--------------+  CFV      Full           Yes      Yes                                 +---------+---------------+---------+-----------+----------+--------------+ SFJ      Full                                                        +---------+---------------+---------+-----------+----------+--------------+ FV Prox  Full                                                        +---------+---------------+---------+-----------+----------+--------------+ FV Mid   Full                                                        +---------+---------------+---------+-----------+----------+--------------+ FV DistalFull                                                        +---------+---------------+---------+-----------+----------+--------------+ PFV      Full                                                        +---------+---------------+---------+-----------+----------+--------------+ POP      Full           Yes      Yes                                 +---------+---------------+---------+-----------+----------+--------------+ PTV      Full                                                        +---------+---------------+---------+-----------+----------+--------------+ PERO                                                  Not visualized  +---------+---------------+---------+-----------+----------+--------------+   +---------+---------------+---------+-----------+----------+--------------+ LEFT     CompressibilityPhasicitySpontaneityPropertiesThrombus Aging +---------+---------------+---------+-----------+----------+--------------+ CFV      Full           Yes      Yes                                 +---------+---------------+---------+-----------+----------+--------------+  SFJ      Full                                                        +---------+---------------+---------+-----------+----------+--------------+ FV Prox  Full                                                        +---------+---------------+---------+-----------+----------+--------------+ FV Mid   Full                                                        +---------+---------------+---------+-----------+----------+--------------+ FV DistalFull                                                        +---------+---------------+---------+-----------+----------+--------------+ PFV      Full                                                        +---------+---------------+---------+-----------+----------+--------------+ POP      Full           Yes      Yes                                 +---------+---------------+---------+-----------+----------+--------------+ PTV      Full                                                        +---------+---------------+---------+-----------+----------+--------------+ PERO     Full                                                        +---------+---------------+---------+-----------+----------+--------------+     Summary: Right: There is no evidence of deep vein thrombosis in the lower extremity. No cystic structure found in the popliteal fossa. Left: There is no evidence of deep vein thrombosis in the lower extremity. No cystic structure found in the popliteal fossa.  *See  table(s) above for measurements and observations. Electronically signed by Servando Snare MD on 11/20/2019 at 8:43:19 AM.    Final     Microbiology: Recent Results (from the past 240 hour(s))  Culture, blood (routine x 2)     Status: None   Collection Time: 11/16/19  2:11 PM   Specimen: BLOOD  Result Value Ref Range Status  Specimen Description BLOOD RIGHT ANTECUBITAL  Final   Special Requests   Final    BOTTLES DRAWN AEROBIC AND ANAEROBIC Blood Culture adequate volume   Culture   Final    NO GROWTH 5 DAYS Performed at [facility], [address] [place], [place] [address]    Report Status 11/21/2019 FINAL  Final  Culture, blood (routine x 2)     Status: None   Collection Time: 11/16/19  2:26 PM   Specimen: BLOOD  Result Value Ref Range Status   Specimen Description BLOOD RIGHT ANTECUBITAL  Final   Special Requests   Final    BOTTLES DRAWN AEROBIC AND ANAEROBIC Blood Culture adequate volume   Culture   Final    NO GROWTH 5 DAYS Performed at [facility], [place] [address] [place], [place] [address]    Report Status 11/21/2019 FINAL  Final  Urine culture     Status: None   Collection Time: 11/17/19  5:16 AM   Specimen: Urine, Random  Result Value Ref Range Status   Specimen Description URINE, RANDOM  Final   Special Requests NONE  Final   Culture   Final    NO GROWTH Performed at [facility], [place] [address] [place], [place] [address]    Report Status 11/17/2019 FINAL  Final  MRSA PCR Screening     Status: None   Collection Time: 11/18/19  6:00 PM   Specimen: Nasal Mucosa; Nasopharyngeal  Result Value Ref Range Status   MRSA by PCR NEGATIVE NEGATIVE Final    Comment:        The GeneXpert MRSA Assay (FDA approved for NASAL specimens only), is one component of a comprehensive MRSA colonization surveillance program. It is not intended to diagnose MRSA infection nor to guide or monitor treatment for MRSA infections. Performed at [facility], [place] [address], [place], [place] [address]   Culture, blood (routine x 2)     Status: None   Collection Time: 11/18/19  6:38 PM   Specimen: BLOOD RIGHT WRIST  Result Value Ref Range Status   Specimen Description BLOOD RIGHT WRIST  Final   Special Requests   Final    BOTTLES DRAWN AEROBIC AND ANAEROBIC Blood Culture results may not be optimal due to an inadequate volume of blood received in culture bottles Performed at [facility], [place] [address] [place], [place] [address]    Culture NO GROWTH 5 DAYS  Final   Report Status 11/23/2019 FINAL  Final  Culture, blood (routine x 2)     Status: None   Collection Time: 11/18/19  6:42 PM   Specimen: BLOOD RIGHT WRIST  Result Value Ref Range Status   Specimen Description BLOOD RIGHT WRIST  Final   Special Requests   Final    BOTTLES DRAWN AEROBIC AND ANAEROBIC Blood Culture adequate volume Performed at [facility], [place] [address], [place], [place] [address]    Culture NO GROWTH 5 DAYS  Final   Report Status 11/23/2019 FINAL  Final     Labs: Basic Metabolic Panel: Recent Labs  Lab 11/20/19 0314 11/20/19 0314 11/21/19 0321 11/21/19 0941 11/22/19 0650 11/23/19 0450 11/24/19 0245  NA 136  --  137  --  142 140 141  K 3.1*  --  3.8  --  3.2* 4.0 3.9  CL 101  --  100  --  104 106 108  CO2 24  --  26  --  22 20* 22  GLUCOSE 105*  --  98  --  77 67* 90  BUN 9  --  9  --  9 11 12   CREATININE 0.75  --  1.04*  --  1.11* 1.10* 1.04*  CALCIUM 7.7*  --  7.7*  --  8.1* 8.2* 8.1*  MG 2.3  --   --  2.0 1.8 2.0 2.0  PHOS 2.5   < > 2.1* 2.0* 2.9 2.8 3.1   < > = values in this interval not displayed.   Liver Function Tests: Recent Labs  Lab 11/20/19 0314 11/21/19 1350 11/22/19 0650 11/23/19 0450 11/24/19 0245  AST 20 35 43* 33 24  ALT 13 13 12 14 12   ALKPHOS 124 147* 193* 234* 206*  BILITOT 0.9 0.8 1.6* 1.2 1.1  PROT 5.4* 5.0* 5.2* 5.5* 5.0*  ALBUMIN 1.8* 1.6* 1.6* 1.5* 1.4*   No results for input(s): LIPASE, AMYLASE in  the last 168 hours. No results for input(s): AMMONIA in the last 168 hours. CBC: Recent Labs  Lab 11/20/19 0314 11/21/19 0941 11/22/19 0650 11/23/19 0450 11/24/19 0245  WBC 21.6* 36.8* 40.6* 35.7* 28.9*  NEUTROABS 17.7* 32.6* 36.6* 31.6* 24.3*  HGB 11.1* 10.2* 10.7* 10.3* 9.7*  HCT 31.7* 31.1* 33.1* 31.6* 29.6*  MCV 84.5 89.1 89.7 89.5 88.4  PLT 346 296 313 305 290   Cardiac Enzymes: No results for input(s): CKTOTAL, CKMB, CKMBINDEX, TROPONINI in the last 168 hours. BNP: BNP (last 3 results) No results for input(s): BNP in the last 8760 hours.  ProBNP (last 3 results) No results for input(s): PROBNP in the last 8760 hours.  CBG: Recent Labs  Lab 11/23/19 1537  GLUCAP 90       Signed:  Cristal Ford  [facility] 11/26/2019, 1:45 PM

## 2019-11-26 NOTE — TOC Transition Note (Signed)
Transition of Care [facility]) - CM/SW Discharge Note Marvetta Gibbons RN, BSN Transitions of Care Unit 4E- RN Case Manager [phone]   Patient Details  Name: [name] MRN: [id number] Date of Birth: [birth year]  [facility] CM/SW Contact:  Dawayne Patricia, RN Phone Number: 11/26/2019, 4:25 PM   Clinical Narrative:    Pt stable for transition home, hospice services have been confirmed- per [name] with [facility]- DME to be delivered to the home today between 12-2pm. Pt to be transported later today after DME delivery confirmed and son is off of work.  1420- call made to son- confirmed that DME has been delivered to the home, son request transport home for later this afternoon- after he gets off work- agreed to call transport between 4-4:30 this afternoon to set up for transport home. Son request staff to call him when [facility] arrives to transport.   39- [facility] called for transport- at this time 13 patients on list ahead of patient- pt has been placed on list for transport home. Paperwork placed on shadow chart along with GOLD DNR.    Final next level of care: Home w Hospice Care Barriers to Discharge: No Barriers Identified, Barriers Resolved   Patient Goals and CMS Choice Patient states their goals for this hospitalization and ongoing recovery are:: return home with hospice CMS Medicare.gov Compare Post Acute Care list provided to:: Patient Represenative (must comment)(son) Choice offered to / list presented to : Adult Children  Discharge Placement               Home with Hospice.         Discharge Plan and Services In-house Referral: Hospice / Palliative Care Discharge Planning Services: CM Consult Post Acute Care Choice: Durable Medical Equipment, Hospice          DME Arranged: Oxygen, Hospice Equipment Package Others DME Agency: Hospice and [place] Date DME Agency Contacted: 11/25/19 Time DME Agency Contacted:  1252 Representative spoke with at DME Agency: [place] Arranged: Disease Management [place] Agency: Hospice and [place] Date [place]: 11/25/19 Time [place]: 1252 Representative spoke with at [place]: [place] Determinants of Health ([place]) Interventions     Readmission Risk Interventions Readmission Risk Prevention Plan 11/26/2019  Transportation Screening Complete  PCP or Specialist Appt within 5-7 Days Complete  Home Care Screening Complete  Medication Review (RN CM) Complete  Some recent data might be hidden

## 2019-11-26 NOTE — Discharge Instructions (Signed)
Hospice Hospice is a service that is designed to provide people who are terminally ill and their families with medical, spiritual, and psychological support. Its aim is to improve your quality of life by keeping you as comfortable as possible in the final stages of life. Who will be my providers when I begin hospice care? Hospice teams often include:  A nurse.  A doctor. The hospice doctor will be available for your care, but you can include your regular doctor or nurse practitioner.  A social worker.  A counselor.  A religious leader (such as a chaplain).  A dietitian.  Therapists.  Trained volunteers who can help with care. What services does hospice provide? Hospice services can vary depending on the center or organization. Generally, they include:  Ways to keep you comfortable, such as: ? Providing care in your home or in a home-like setting. ? Working with your family and friends to help meet your needs. ? Allowing you to enjoy the support of loved ones by receiving much of your basic care from family and friends.  Pain relief and symptom management. The staff will supply all necessary medicines and equipment so that you can stay comfortable and alert enough to enjoy the company of your friends and family.  Visits or care from a nurse and doctor. This may include 24-hour on-call services.  Companionship when you are alone.  Allowing you and your family to rest. Hospice staff may do light housekeeping, prepare meals, and run errands.  Counseling. They will make sure your emotional, spiritual, and social needs are being met, as well as those needs of your family members.  Spiritual care. This will be individualized to meet your needs and your family's needs. It may involve: ? Helping you and your family understand the dying process. ? Helping you say goodbye to your family and friends. ? Performing a specific religious ceremony or ritual.  Massage.  Nutrition  therapy.  Physical and occupational therapy.  Short-term inpatient care, if something cannot be managed in the home.  Art or music therapy.  Bereavement support for grieving family members. When should hospice care begin? Most people who use hospice are believed to have less than 6 months to live.  Your family and health care providers can help you decide when hospice services should begin.  If you live longer than 6 months but your condition does not improve, your doctor may be able to approve you for continued hospice care.  If your condition improves, you may discontinue the program. What should I consider before selecting a program? Most hospice programs are run by nonprofit, independent organizations. Some are affiliated with hospitals, nursing homes, or home health care agencies. Hospice programs can take place in your home or at a hospice center, hospital, or skilled nursing facility. When choosing a hospice program, ask the following questions:  What services are available to me?  What services will be offered to my loved ones?  How involved will my loved ones be?  How involved will my health care provider be?  Who makes up the hospice care team? How are they trained or screened?  How will my pain and symptoms be managed?  If my circumstances change, can the services be provided in a different setting, such as my home or in the hospital?  Is the program reviewed and licensed by the state or certified in some other way?  What does it cost? Is it covered by insurance?  If I choose a hospice   center or nursing home, where is the hospice center located? Is it convenient for family and friends?  If I choose a hospice center or nursing home, can my family and friends visit any time?  Will you provide emotional and spiritual support?  Who can my family call with questions? Where can I learn more about hospice? You can learn about existing hospice programs in your area  from your health care providers. You can also read more about hospice online. The websites of the following organizations have helpful information:  [facility] and Palliative Care Organization [facility]): http://www.brown-buchanan.com/  National Association for [facility]): http://massey-hart.com/  Hospice Foundation of [place] ([place]): www.hospicefoundation.org  American Cancer Society (ACS): www.cancer.org  Hospice Net: www.hospicenet.org  Visiting Nurse Associations of [place] (VNAA): www.vnaa.org You may also find more information by contacting the following agencies:  A local agency on aging.  Your local [employer] chapter.  Your state's department of health or social services. Summary  Hospice is a service that is designed to provide people who are terminally ill and their families with medical, spiritual, and psychological support.  Hospice aims to improve your quality of life by keeping you as comfortable as possible in the final stages of life.  Hospice teams often include a doctor, nurse, social worker, counselor, religious leader,dietitian, therapists, and volunteers.  Hospice care generally includes medicine for symptom management, visits from doctors and nurses, physical and occupational therapy, nutrition counseling, spiritual and emotional counseling, caregiver support, and bereavement support for grieving family members.  Hospice programs can take place in your home or at a hospice center, hospital, or skilled nursing facility. This information is not intended to replace advice given to you by your health care provider. Make sure you discuss any questions you have with your health care provider. Document Revised: 06/30/2019 Document Reviewed: 10/29/2016 Elsevier Patient Education  [place].

## 2019-11-26 NOTE — Progress Notes (Signed)
Patient HR went up to 130s went in to check on her, she was complaining of stomach pain. Temp 101.2 and BP 163/58. Paged MD, orders were placed for tylenol, 5mg  metoprolol. 547ml bolus of normal saline. Current HR 90, BP 136/47, Temp 99.1. Patient asleep in bed

## 2019-11-30 LAB — SURGICAL PATHOLOGY

## 2019-12-14 ENCOUNTER — Inpatient Hospital Stay: Payer: Medicare Other | Admitting: Hematology and Oncology

## 2019-12-20 DEATH — deceased

## 2020-01-12 ENCOUNTER — Ambulatory Visit: Payer: Medicare Other | Admitting: Physician Assistant

## 2021-06-17 IMAGING — CT CT ABD-PELV W/ CM
1 of 3 series · 10 of 32 positions shown, 13 images · IV contrast (APPLIED)
Comparison: 02/07/2017
COMPARISON: 02/07/2017

Addendum:
CLINICAL DATA: Lower abdominal pain for 1 week. History of
hysterectomy and left breast cancer with lumpectomy and radiation.

EXAM:
CT ABDOMEN AND PELVIS WITH CONTRAST
TECHNIQUE: Multidetector CT imaging of the abdomen and pelvis was performed
using the standard protocol following bolus administration of
intravenous contrast.
CONTRAST:  100mL [id number] IOPAMIDOL ([id number]) INJECTION 61%
Creatinine was obtained on site at [HOSPITAL] at [HOSPITAL].
Results: Creatinine 1.0 mg/dL.

[Series 2: abd/pelvis w/cm · axial · 0.64mm/px · z∈[-399,-49]mm · 10 of 82 slices shown, 13 images]
[im 6/82  soft-tissue]
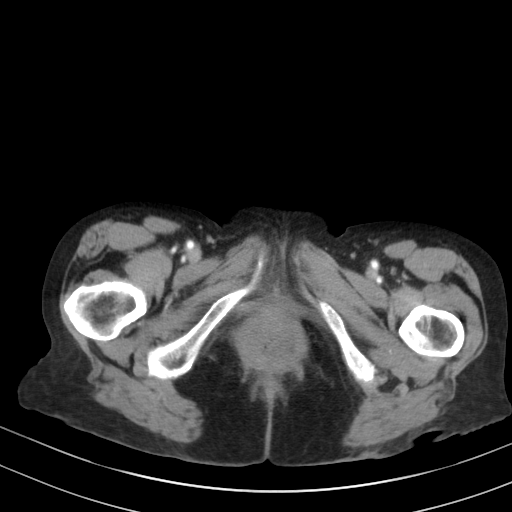
[im 6/82  bone]
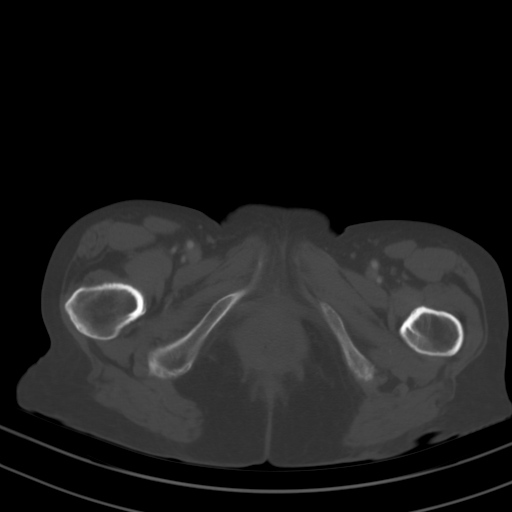
[im 17/82  soft-tissue]
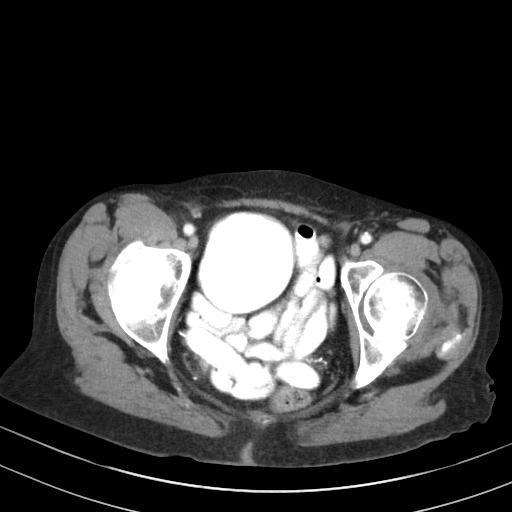
[im 28/82  soft-tissue]
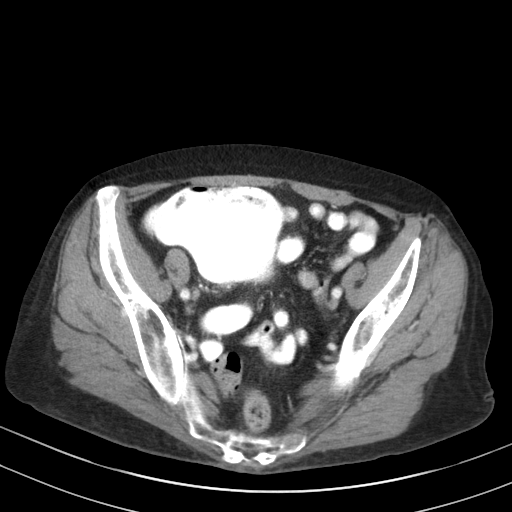
[im 38/82  soft-tissue]
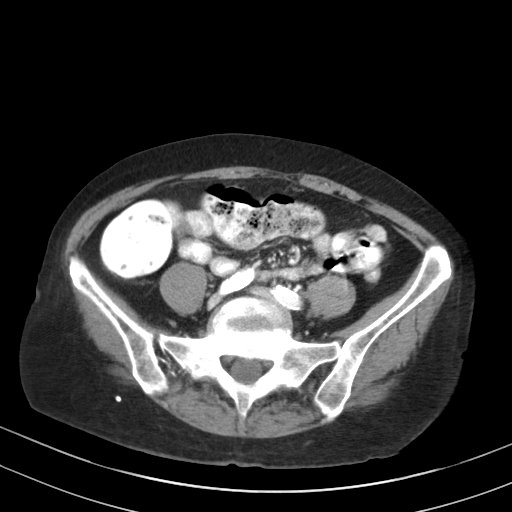
[im 44/82  soft-tissue]
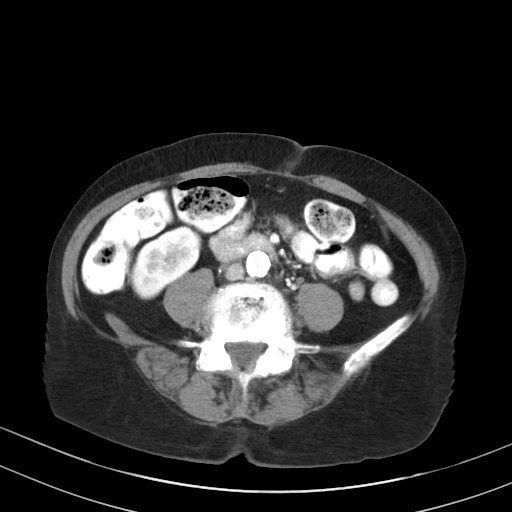
[im 55/82  soft-tissue]
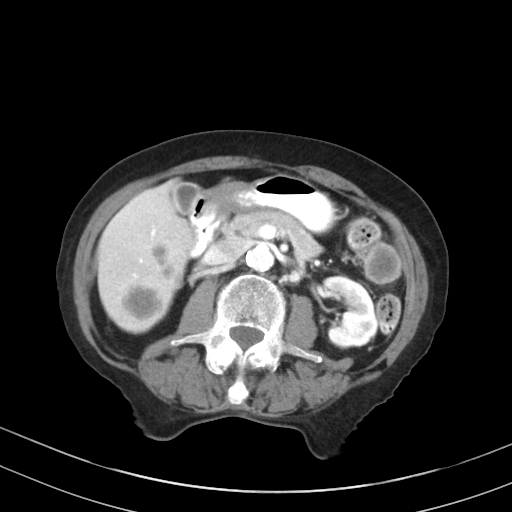
[im 60/82  lung]
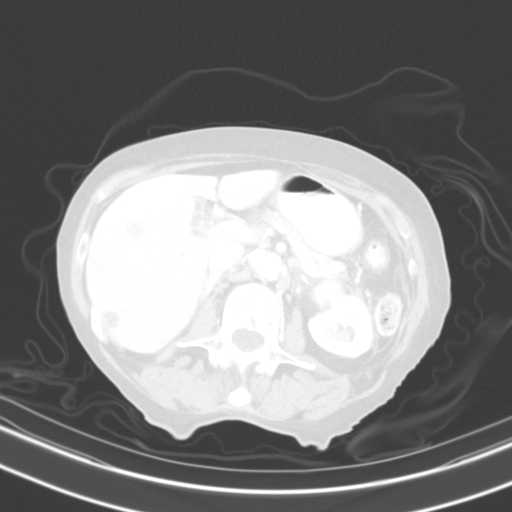
[im 65/82  soft-tissue]
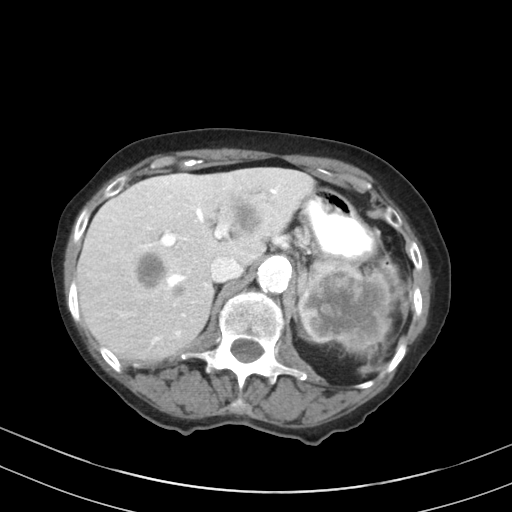
[im 65/82  lung]
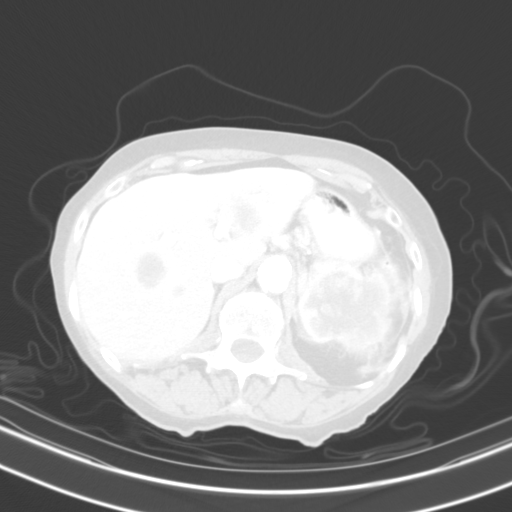
[im 71/82  lung]
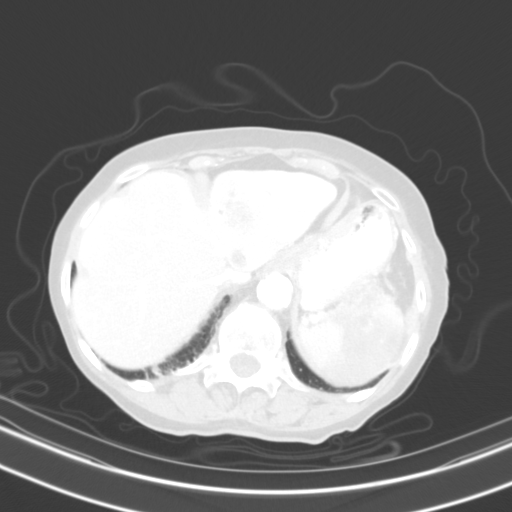
[im 76/82  soft-tissue]
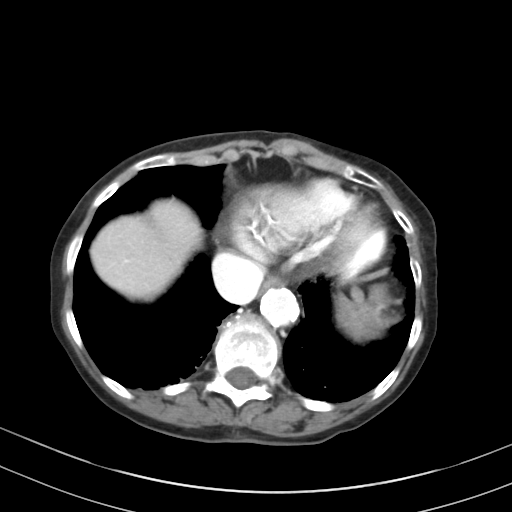
[im 76/82  lung]
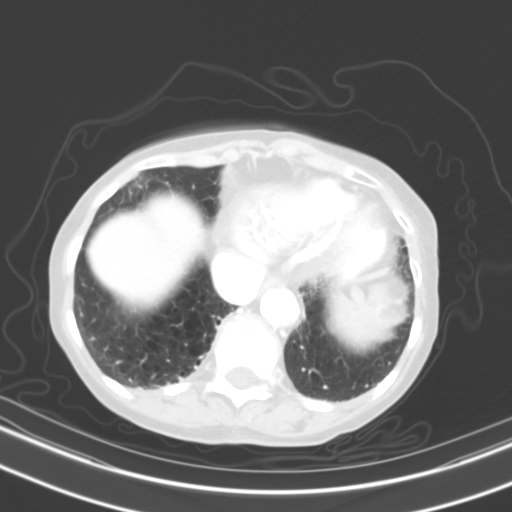

[10 of 32 positions shown; findings below may reference images not displayed]

FINDINGS: Lower chest: Signs of pulmonary emphysema. No signs of consolidation
or pleural effusion.

Hepatobiliary: Multiple hepatic masses, largest in the left hepatic
lobe measures is 4.0 x 3.6 cm. The no subsegment is spared this
process.

Lesions ranging between a cm and 2-1/2 cm are noted in the right
hepatic lobe. At least 10 lesions are present in the right hepatic
lobe. The portal vein is patent. Hepatic veins are patent.

Pancreas: Mass lesion associated with spleen and pancreas,
pancreatic tail tracking in the splenic hilum measuring
approximately 5.4 x 5.4 cm (image 16, series 2) remainder of the
pancreas is normal.

Spleen: Spleen with signs of splenic infarct likely due to vascular
compromise at the level of the splenic hilum due to large mass.

Adrenals/Urinary Tract: And there may be a small left adrenal nodule
measuring about 4-5 mm in thickness. This is not clear.

Left kidney is in close proximity to left upper quadrant mass. Right
kidney displaced inferiorly by mild hepatic enlargement and is
mildly ptotic, no signs of suspicious renal mass. Small cysts in the
bilateral kidneys. No hydronephrosis.

Stomach/Bowel: Moderate cecal distension of approximately 6 cm.
Gradual transition of the colon but with presumed serosal disease at
the splenic flexure and signs of perisplenic implants. Bowel is
partially opacified with enteric contrast on today's exam. There is
a pericolonic soft tissue implant measuring 2.6 cm near the splenic
flexure. There is nodularity in this location as well with another
small nodule adjacent to the spleen and splenic flexure, (image 15,
series [DATE] cm.

Question of some gastric antral thickening as well but the dominant
areas of abnormality are distant from this location.

Vascular/Lymphatic: Moderate calcific and non calcified
atherosclerotic plaque throughout the abdominal aorta. No signs of
aneurysm. No adenopathy in the retroperitoneum or upper abdomen.

No signs of pelvic lymphadenopathy.

Reproductive: Post hysterectomy.

Other: Signs of pelvic floor dysfunction with bowing of levator in
on musculature. No signs of abdominal wall hernia.

Musculoskeletal: No sign of acute bone process or destructive bone
lesion. Spinal degenerative changes with grade 1 anterolisthesis of
L4 on L5.
IMPRESSION: 1. Large mass in the splenic hilum and pancreatic tail, associated
with masses in the liver and in the peritoneum and likely with
colonic serosal disease at the splenic flexure. Findings may
represent a primary pancreatic neoplasm with metastatic disease
versus abdominal metastatic disease from breast cancer or similar
process.
2. No overt signs of obstruction though there is some mild to
moderate distension of the cecum and evidence of serosal disease at
the splenic flexure of the colon. Correlate with any history that
would suggest developing obstruction and attention on follow-up.
3. Left adrenal nodularity is suspected though left adrenal is not
clearly seen on today's study.
4. Atherosclerosis and emphysema.
5. A call is out to the referring provider to further discuss
findings in the above case.

Aortic Atherosclerosis ([id number]-QTX.X) and Emphysema ([id number].8).

ADDENDUM:
These results were called by telephone at the time of interpretation
on 10/27/2019 at [DATE] to provider SAVIO LOCKLEAR , who verbally
acknowledged these results.

*** End of Addendum ***
FINDINGS: Lower chest: Signs of pulmonary emphysema. No signs of consolidation
or pleural effusion.

Hepatobiliary: Multiple hepatic masses, largest in the left hepatic
lobe measures is 4.0 x 3.6 cm. The no subsegment is spared this
process.

Lesions ranging between a cm and 2-1/2 cm are noted in the right
hepatic lobe. At least 10 lesions are present in the right hepatic
lobe. The portal vein is patent. Hepatic veins are patent.

Pancreas: Mass lesion associated with spleen and pancreas,
pancreatic tail tracking in the splenic hilum measuring
approximately 5.4 x 5.4 cm (image 16, series 2) remainder of the
pancreas is normal.

Spleen: Spleen with signs of splenic infarct likely due to vascular
compromise at the level of the splenic hilum due to large mass.

Adrenals/Urinary Tract: And there may be a small left adrenal nodule
measuring about 4-5 mm in thickness. This is not clear.

Left kidney is in close proximity to left upper quadrant mass. Right
kidney displaced inferiorly by mild hepatic enlargement and is
mildly ptotic, no signs of suspicious renal mass. Small cysts in the
bilateral kidneys. No hydronephrosis.

Stomach/Bowel: Moderate cecal distension of approximately 6 cm.
Gradual transition of the colon but with presumed serosal disease at
the splenic flexure and signs of perisplenic implants. Bowel is
partially opacified with enteric contrast on today's exam. There is
a pericolonic soft tissue implant measuring 2.6 cm near the splenic
flexure. There is nodularity in this location as well with another
small nodule adjacent to the spleen and splenic flexure, (image 15,
series [DATE] cm.

Question of some gastric antral thickening as well but the dominant
areas of abnormality are distant from this location.

Vascular/Lymphatic: Moderate calcific and non calcified
atherosclerotic plaque throughout the abdominal aorta. No signs of
aneurysm. No adenopathy in the retroperitoneum or upper abdomen.

No signs of pelvic lymphadenopathy.

Reproductive: Post hysterectomy.

Other: Signs of pelvic floor dysfunction with bowing of levator in
on musculature. No signs of abdominal wall hernia.

Musculoskeletal: No sign of acute bone process or destructive bone
lesion. Spinal degenerative changes with grade 1 anterolisthesis of
L4 on L5.
IMPRESSION: 1. Large mass in the splenic hilum and pancreatic tail, associated
with masses in the liver and in the peritoneum and likely with
colonic serosal disease at the splenic flexure. Findings may
represent a primary pancreatic neoplasm with metastatic disease
versus abdominal metastatic disease from breast cancer or similar
process.
2. No overt signs of obstruction though there is some mild to
moderate distension of the cecum and evidence of serosal disease at
the splenic flexure of the colon. Correlate with any history that
would suggest developing obstruction and attention on follow-up.
3. Left adrenal nodularity is suspected though left adrenal is not
clearly seen on today's study.
4. Atherosclerosis and emphysema.
5. A call is out to the referring provider to further discuss
findings in the above case.

Aortic Atherosclerosis ([id number]-QTX.X) and Emphysema ([id number]).
